# Patient Record
Sex: Female | Born: 1950
Health system: Southern US, Community
[De-identification: ages and names within clinical notes are randomized; demographics above are authoritative.]

## PROBLEM LIST (undated history)

## (undated) DIAGNOSIS — D638 Anemia in other chronic diseases classified elsewhere: Secondary | ICD-10-CM

## (undated) DIAGNOSIS — S5290XA Unspecified fracture of unspecified forearm, initial encounter for closed fracture: Secondary | ICD-10-CM

## (undated) DIAGNOSIS — K219 Gastro-esophageal reflux disease without esophagitis: Secondary | ICD-10-CM

## (undated) DIAGNOSIS — I10 Essential (primary) hypertension: Secondary | ICD-10-CM

## (undated) DIAGNOSIS — Z972 Presence of dental prosthetic device (complete) (partial): Secondary | ICD-10-CM

## (undated) DIAGNOSIS — F419 Anxiety disorder, unspecified: Secondary | ICD-10-CM

## (undated) DIAGNOSIS — K208 Other esophagitis without bleeding: Secondary | ICD-10-CM

## (undated) DIAGNOSIS — R0789 Other chest pain: Secondary | ICD-10-CM

## (undated) DIAGNOSIS — B3731 Acute candidiasis of vulva and vagina: Secondary | ICD-10-CM

## (undated) DIAGNOSIS — R4189 Other symptoms and signs involving cognitive functions and awareness: Secondary | ICD-10-CM

## (undated) DIAGNOSIS — E278 Other specified disorders of adrenal gland: Secondary | ICD-10-CM

## (undated) DIAGNOSIS — G4700 Insomnia, unspecified: Secondary | ICD-10-CM

## (undated) DIAGNOSIS — K589 Irritable bowel syndrome without diarrhea: Secondary | ICD-10-CM

## (undated) DIAGNOSIS — E785 Hyperlipidemia, unspecified: Secondary | ICD-10-CM

## (undated) DIAGNOSIS — G629 Polyneuropathy, unspecified: Secondary | ICD-10-CM

## (undated) DIAGNOSIS — I639 Cerebral infarction, unspecified: Secondary | ICD-10-CM

## (undated) DIAGNOSIS — R739 Hyperglycemia, unspecified: Secondary | ICD-10-CM

## (undated) DIAGNOSIS — M1990 Unspecified osteoarthritis, unspecified site: Secondary | ICD-10-CM

## (undated) DIAGNOSIS — G219 Secondary parkinsonism, unspecified: Secondary | ICD-10-CM

## (undated) DIAGNOSIS — K5730 Diverticulosis of large intestine without perforation or abscess without bleeding: Secondary | ICD-10-CM

## (undated) DIAGNOSIS — N183 Chronic kidney disease, stage 3 unspecified: Secondary | ICD-10-CM

## (undated) DIAGNOSIS — D649 Anemia, unspecified: Secondary | ICD-10-CM

## (undated) DIAGNOSIS — K2960 Other gastritis without bleeding: Secondary | ICD-10-CM

## (undated) DIAGNOSIS — B373 Candidiasis of vulva and vagina: Secondary | ICD-10-CM

## (undated) DIAGNOSIS — R7989 Other specified abnormal findings of blood chemistry: Secondary | ICD-10-CM

## (undated) DIAGNOSIS — F488 Other specified nonpsychotic mental disorders: Secondary | ICD-10-CM

## (undated) DIAGNOSIS — E119 Type 2 diabetes mellitus without complications: Secondary | ICD-10-CM

## (undated) DIAGNOSIS — E079 Disorder of thyroid, unspecified: Secondary | ICD-10-CM

## (undated) HISTORY — DX: Cerebral infarction, unspecified: I63.9

## (undated) HISTORY — DX: Unspecified fracture of unspecified forearm, initial encounter for closed fracture: S52.90XA

## (undated) HISTORY — DX: Anemia in other chronic diseases classified elsewhere: D63.8

## (undated) HISTORY — DX: Diverticulosis of large intestine without perforation or abscess without bleeding: K57.30

## (undated) HISTORY — DX: Hyperlipidemia, unspecified: E78.5

## (undated) HISTORY — DX: Anxiety disorder, unspecified: F41.9

## (undated) HISTORY — DX: Disorder of thyroid, unspecified: E07.9

## (undated) HISTORY — DX: Other chest pain: R07.89

## (undated) HISTORY — DX: Polyneuropathy, unspecified: G62.9

## (undated) HISTORY — DX: Other esophagitis without bleeding: K20.80

## (undated) HISTORY — DX: Irritable bowel syndrome, unspecified: K58.9

## (undated) HISTORY — DX: Anemia, unspecified: D64.9

## (undated) HISTORY — DX: Essential (primary) hypertension: I10

## (undated) HISTORY — DX: Insomnia, unspecified: G47.00

## (undated) HISTORY — DX: Other esophagitis: K20.8

## (undated) HISTORY — DX: Hyperglycemia, unspecified: R73.9

## (undated) HISTORY — PX: ESOPHAGOGASTRODUODENOSCOPY: SHX1529

## (undated) HISTORY — DX: Other gastritis without bleeding: K29.60

## (undated) HISTORY — DX: Gastro-esophageal reflux disease without esophagitis: K21.9

## (undated) HISTORY — DX: Other symptoms and signs involving cognitive functions and awareness: R41.89

## (undated) HISTORY — DX: Type 2 diabetes mellitus without complications: E11.9

## (undated) HISTORY — DX: Secondary parkinsonism, unspecified: G21.9

## (undated) HISTORY — DX: Other specified nonpsychotic mental disorders: F48.8

## (undated) HISTORY — DX: Acute candidiasis of vulva and vagina: B37.31

## (undated) HISTORY — PX: COLONOSCOPY: SHX174

## (undated) HISTORY — DX: Other specified abnormal findings of blood chemistry: R79.89

## (undated) HISTORY — PX: BREAST CYST ASPIRATION: SHX578

## (undated) HISTORY — DX: Other specified disorders of adrenal gland: E27.8

## (undated) HISTORY — DX: Candidiasis of vulva and vagina: B37.3

---

## 1975-02-07 HISTORY — PX: OTHER SURGICAL HISTORY: SHX169

## 1982-02-06 HISTORY — PX: BREAST REDUCTION SURGERY: SHX8

## 1983-02-07 HISTORY — PX: REDUCTION MAMMAPLASTY: SUR839

## 1993-02-06 HISTORY — PX: ABDOMINAL HYSTERECTOMY: SHX81

## 2002-08-07 DIAGNOSIS — F488 Other specified nonpsychotic mental disorders: Secondary | ICD-10-CM

## 2002-08-07 HISTORY — DX: Other specified nonpsychotic mental disorders: F48.8

## 2004-01-27 ENCOUNTER — Ambulatory Visit: Payer: Self-pay | Admitting: Urology

## 2004-02-16 ENCOUNTER — Encounter: Payer: Self-pay | Admitting: Family Medicine

## 2004-03-09 HISTORY — PX: CHOLECYSTECTOMY: SHX55

## 2004-03-21 ENCOUNTER — Ambulatory Visit: Payer: Self-pay | Admitting: Surgery

## 2004-03-29 ENCOUNTER — Ambulatory Visit: Payer: Self-pay | Admitting: Surgery

## 2004-03-30 ENCOUNTER — Ambulatory Visit: Payer: Self-pay | Admitting: Surgery

## 2004-06-09 ENCOUNTER — Ambulatory Visit: Payer: Self-pay | Admitting: Family Medicine

## 2004-07-19 ENCOUNTER — Ambulatory Visit: Payer: Self-pay | Admitting: Family Medicine

## 2004-07-22 ENCOUNTER — Ambulatory Visit: Payer: Self-pay | Admitting: Family Medicine

## 2004-09-02 ENCOUNTER — Ambulatory Visit: Payer: Self-pay | Admitting: Family Medicine

## 2004-09-16 ENCOUNTER — Ambulatory Visit: Payer: Self-pay | Admitting: Family Medicine

## 2004-11-07 ENCOUNTER — Ambulatory Visit: Payer: Self-pay | Admitting: Family Medicine

## 2004-11-10 ENCOUNTER — Ambulatory Visit: Payer: Self-pay | Admitting: Family Medicine

## 2004-11-15 ENCOUNTER — Ambulatory Visit: Payer: Self-pay | Admitting: Family Medicine

## 2004-11-29 ENCOUNTER — Ambulatory Visit: Payer: Self-pay | Admitting: Family Medicine

## 2005-01-06 ENCOUNTER — Ambulatory Visit: Payer: Self-pay | Admitting: Family Medicine

## 2005-01-10 ENCOUNTER — Ambulatory Visit: Payer: Self-pay | Admitting: Family Medicine

## 2005-01-24 ENCOUNTER — Ambulatory Visit: Payer: Self-pay | Admitting: Family Medicine

## 2005-02-06 ENCOUNTER — Emergency Department (HOSPITAL_COMMUNITY): Admission: EM | Admit: 2005-02-06 | Discharge: 2005-02-06 | Payer: Self-pay | Admitting: Emergency Medicine

## 2005-02-21 ENCOUNTER — Ambulatory Visit: Payer: Self-pay | Admitting: Family Medicine

## 2005-03-14 ENCOUNTER — Ambulatory Visit: Payer: Self-pay | Admitting: Family Medicine

## 2005-03-24 ENCOUNTER — Ambulatory Visit: Payer: Self-pay | Admitting: Family Medicine

## 2005-03-31 ENCOUNTER — Ambulatory Visit: Payer: Self-pay | Admitting: Family Medicine

## 2005-06-13 ENCOUNTER — Ambulatory Visit: Payer: Self-pay | Admitting: Family Medicine

## 2005-07-04 ENCOUNTER — Ambulatory Visit: Payer: Self-pay | Admitting: Family Medicine

## 2005-07-17 ENCOUNTER — Ambulatory Visit: Payer: Self-pay | Admitting: Internal Medicine

## 2005-09-18 ENCOUNTER — Ambulatory Visit: Payer: Self-pay | Admitting: Family Medicine

## 2005-11-30 ENCOUNTER — Ambulatory Visit: Payer: Self-pay | Admitting: Family Medicine

## 2005-12-13 ENCOUNTER — Encounter: Admission: RE | Admit: 2005-12-13 | Discharge: 2005-12-13 | Payer: Self-pay | Admitting: Orthopedic Surgery

## 2006-01-10 ENCOUNTER — Ambulatory Visit: Payer: Self-pay | Admitting: Family Medicine

## 2006-01-18 ENCOUNTER — Ambulatory Visit: Payer: Self-pay | Admitting: Family Medicine

## 2006-04-10 ENCOUNTER — Ambulatory Visit: Payer: Self-pay | Admitting: Family Medicine

## 2006-04-11 ENCOUNTER — Encounter: Payer: Self-pay | Admitting: Family Medicine

## 2006-06-08 ENCOUNTER — Telehealth (INDEPENDENT_AMBULATORY_CARE_PROVIDER_SITE_OTHER): Payer: Self-pay | Admitting: *Deleted

## 2006-06-12 ENCOUNTER — Encounter: Payer: Self-pay | Admitting: Family Medicine

## 2006-06-27 ENCOUNTER — Ambulatory Visit: Payer: Self-pay | Admitting: Family Medicine

## 2006-06-27 LAB — CONVERTED CEMR LAB
AST: 26 units/L (ref 0–37)
Bilirubin, Direct: 0.1 mg/dL (ref 0.0–0.3)
Chloride: 103 meq/L (ref 96–112)
Direct LDL: 151 mg/dL
Eosinophils Absolute: 0.2 10*3/uL (ref 0.0–0.6)
Eosinophils Relative: 3.4 % (ref 0.0–5.0)
GFR calc non Af Amer: 93 mL/min
Glucose, Bld: 101 mg/dL — ABNORMAL HIGH (ref 70–99)
HCT: 37.9 % (ref 36.0–46.0)
Hemoglobin: 12.9 g/dL (ref 12.0–15.0)
Lymphocytes Relative: 26 % (ref 12.0–46.0)
MCV: 83.5 fL (ref 78.0–100.0)
Monocytes Absolute: 0.4 10*3/uL (ref 0.2–0.7)
Neutrophils Relative %: 63.4 % (ref 43.0–77.0)
Potassium: 3.7 meq/L (ref 3.5–5.1)
RBC: 4.55 M/uL (ref 3.87–5.11)
Sodium: 139 meq/L (ref 135–145)
Total Bilirubin: 0.9 mg/dL (ref 0.3–1.2)
Triglycerides: 120 mg/dL (ref 0–149)
WBC: 5.2 10*3/uL (ref 4.5–10.5)

## 2006-07-05 DIAGNOSIS — E039 Hypothyroidism, unspecified: Secondary | ICD-10-CM | POA: Insufficient documentation

## 2006-07-05 DIAGNOSIS — K219 Gastro-esophageal reflux disease without esophagitis: Secondary | ICD-10-CM

## 2006-07-05 DIAGNOSIS — K5730 Diverticulosis of large intestine without perforation or abscess without bleeding: Secondary | ICD-10-CM | POA: Insufficient documentation

## 2006-07-05 DIAGNOSIS — E782 Mixed hyperlipidemia: Secondary | ICD-10-CM | POA: Insufficient documentation

## 2006-07-05 DIAGNOSIS — I10 Essential (primary) hypertension: Secondary | ICD-10-CM | POA: Insufficient documentation

## 2006-07-05 DIAGNOSIS — K589 Irritable bowel syndrome without diarrhea: Secondary | ICD-10-CM

## 2006-07-05 DIAGNOSIS — G4700 Insomnia, unspecified: Secondary | ICD-10-CM

## 2006-07-06 ENCOUNTER — Ambulatory Visit: Payer: Self-pay | Admitting: Family Medicine

## 2006-07-06 DIAGNOSIS — R7989 Other specified abnormal findings of blood chemistry: Secondary | ICD-10-CM | POA: Insufficient documentation

## 2006-07-07 ENCOUNTER — Encounter: Payer: Self-pay | Admitting: Family Medicine

## 2006-07-07 DIAGNOSIS — E278 Other specified disorders of adrenal gland: Secondary | ICD-10-CM | POA: Insufficient documentation

## 2006-07-25 ENCOUNTER — Ambulatory Visit: Payer: Self-pay | Admitting: Urology

## 2006-07-26 ENCOUNTER — Telehealth: Payer: Self-pay | Admitting: Family Medicine

## 2006-07-27 ENCOUNTER — Telehealth: Payer: Self-pay | Admitting: Family Medicine

## 2006-08-15 ENCOUNTER — Encounter: Payer: Self-pay | Admitting: Family Medicine

## 2006-08-15 ENCOUNTER — Ambulatory Visit: Payer: Self-pay | Admitting: Unknown Physician Specialty

## 2006-08-15 LAB — HM COLONOSCOPY

## 2006-08-16 ENCOUNTER — Encounter: Payer: Self-pay | Admitting: Family Medicine

## 2006-09-28 ENCOUNTER — Encounter: Payer: Self-pay | Admitting: Family Medicine

## 2006-09-28 ENCOUNTER — Telehealth: Payer: Self-pay | Admitting: Family Medicine

## 2006-09-28 ENCOUNTER — Ambulatory Visit: Payer: Self-pay | Admitting: Family Medicine

## 2006-09-28 DIAGNOSIS — M79609 Pain in unspecified limb: Secondary | ICD-10-CM

## 2006-11-09 ENCOUNTER — Ambulatory Visit: Payer: Self-pay | Admitting: Family Medicine

## 2006-11-15 ENCOUNTER — Ambulatory Visit: Payer: Self-pay | Admitting: Family Medicine

## 2006-11-15 DIAGNOSIS — E669 Obesity, unspecified: Secondary | ICD-10-CM

## 2006-11-29 ENCOUNTER — Encounter: Payer: Self-pay | Admitting: Family Medicine

## 2007-01-08 ENCOUNTER — Ambulatory Visit: Payer: Self-pay | Admitting: Family Medicine

## 2007-01-15 ENCOUNTER — Encounter: Payer: Self-pay | Admitting: Family Medicine

## 2007-01-21 ENCOUNTER — Ambulatory Visit: Payer: Self-pay | Admitting: Family Medicine

## 2007-01-25 ENCOUNTER — Encounter (INDEPENDENT_AMBULATORY_CARE_PROVIDER_SITE_OTHER): Payer: Self-pay | Admitting: *Deleted

## 2007-02-14 ENCOUNTER — Ambulatory Visit: Payer: Self-pay | Admitting: Family Medicine

## 2007-02-14 DIAGNOSIS — F411 Generalized anxiety disorder: Secondary | ICD-10-CM | POA: Insufficient documentation

## 2007-03-18 ENCOUNTER — Ambulatory Visit: Payer: Self-pay | Admitting: Family Medicine

## 2007-06-27 ENCOUNTER — Encounter: Payer: Self-pay | Admitting: Family Medicine

## 2007-07-08 DIAGNOSIS — E278 Other specified disorders of adrenal gland: Secondary | ICD-10-CM

## 2007-07-08 HISTORY — PX: CT ABD W & PELVIS WO CM: HXRAD294

## 2007-07-08 HISTORY — DX: Other specified disorders of adrenal gland: E27.8

## 2007-07-15 ENCOUNTER — Ambulatory Visit: Payer: Self-pay | Admitting: Family Medicine

## 2007-07-25 ENCOUNTER — Ambulatory Visit: Payer: Self-pay | Admitting: Urology

## 2007-07-25 ENCOUNTER — Encounter: Payer: Self-pay | Admitting: Family Medicine

## 2007-07-29 ENCOUNTER — Encounter (INDEPENDENT_AMBULATORY_CARE_PROVIDER_SITE_OTHER): Payer: Self-pay | Admitting: *Deleted

## 2007-08-02 LAB — CONVERTED CEMR LAB
ALT: 26 units/L (ref 0–35)
Alkaline Phosphatase: 90 units/L (ref 39–117)
Bilirubin, Direct: 0.1 mg/dL (ref 0.0–0.3)
CO2: 29 meq/L (ref 19–32)
Chloride: 101 meq/L (ref 96–112)
Glucose, Bld: 98 mg/dL (ref 70–99)
Lymphocytes Relative: 32.6 % (ref 12.0–46.0)
Monocytes Relative: 6.2 % (ref 3.0–12.0)
Platelets: 193 10*3/uL (ref 150–400)
Potassium: 3.9 meq/L (ref 3.5–5.1)
RDW: 13.5 % (ref 11.5–14.6)
Sodium: 140 meq/L (ref 135–145)
Total Protein: 7.6 g/dL (ref 6.0–8.3)

## 2007-08-13 DIAGNOSIS — R339 Retention of urine, unspecified: Secondary | ICD-10-CM | POA: Insufficient documentation

## 2007-08-15 ENCOUNTER — Ambulatory Visit: Payer: Self-pay | Admitting: Family Medicine

## 2007-08-30 ENCOUNTER — Telehealth: Payer: Self-pay | Admitting: Family Medicine

## 2007-09-04 ENCOUNTER — Telehealth: Payer: Self-pay | Admitting: Family Medicine

## 2007-09-06 ENCOUNTER — Telehealth: Payer: Self-pay | Admitting: Family Medicine

## 2007-09-06 ENCOUNTER — Encounter (INDEPENDENT_AMBULATORY_CARE_PROVIDER_SITE_OTHER): Payer: Self-pay | Admitting: *Deleted

## 2007-09-09 ENCOUNTER — Encounter: Payer: Self-pay | Admitting: Family Medicine

## 2007-09-23 ENCOUNTER — Other Ambulatory Visit: Payer: Self-pay

## 2007-09-23 ENCOUNTER — Ambulatory Visit: Payer: Self-pay

## 2007-09-25 ENCOUNTER — Ambulatory Visit: Payer: Self-pay | Admitting: Family Medicine

## 2007-11-28 ENCOUNTER — Encounter: Payer: Self-pay | Admitting: Family Medicine

## 2008-01-08 ENCOUNTER — Encounter: Payer: Self-pay | Admitting: Family Medicine

## 2008-01-08 HISTORY — PX: CYSTOSCOPY: SUR368

## 2008-02-03 ENCOUNTER — Ambulatory Visit: Payer: Self-pay | Admitting: Family Medicine

## 2008-02-03 DIAGNOSIS — K2960 Other gastritis without bleeding: Secondary | ICD-10-CM

## 2008-02-03 DIAGNOSIS — K208 Other esophagitis: Secondary | ICD-10-CM

## 2008-02-12 ENCOUNTER — Ambulatory Visit: Payer: Self-pay | Admitting: Family Medicine

## 2008-02-12 LAB — CONVERTED CEMR LAB
Albumin: 3.6 g/dL (ref 3.5–5.2)
Alkaline Phosphatase: 123 units/L — ABNORMAL HIGH (ref 39–117)
Bilirubin, Direct: 0.1 mg/dL (ref 0.0–0.3)
Cholesterol: 231 mg/dL (ref 0–200)
TSH: 0.04 microintl units/mL — ABNORMAL LOW (ref 0.35–5.50)
Total CHOL/HDL Ratio: 6.4
VLDL: 24 mg/dL (ref 0–40)

## 2008-02-17 ENCOUNTER — Ambulatory Visit: Payer: Self-pay | Admitting: Family Medicine

## 2008-02-17 DIAGNOSIS — B373 Candidiasis of vulva and vagina: Secondary | ICD-10-CM

## 2008-02-17 DIAGNOSIS — B3731 Acute candidiasis of vulva and vagina: Secondary | ICD-10-CM | POA: Insufficient documentation

## 2008-02-26 ENCOUNTER — Encounter: Payer: Self-pay | Admitting: Family Medicine

## 2008-02-26 ENCOUNTER — Ambulatory Visit: Payer: Self-pay | Admitting: Family Medicine

## 2008-02-28 ENCOUNTER — Encounter (INDEPENDENT_AMBULATORY_CARE_PROVIDER_SITE_OTHER): Payer: Self-pay | Admitting: *Deleted

## 2008-03-09 ENCOUNTER — Encounter: Payer: Self-pay | Admitting: Family Medicine

## 2008-03-19 ENCOUNTER — Telehealth: Payer: Self-pay | Admitting: Family Medicine

## 2008-03-21 ENCOUNTER — Encounter: Payer: Self-pay | Admitting: Family Medicine

## 2008-04-03 ENCOUNTER — Emergency Department (HOSPITAL_COMMUNITY): Admission: EM | Admit: 2008-04-03 | Discharge: 2008-04-03 | Payer: Self-pay | Admitting: Family Medicine

## 2008-05-19 ENCOUNTER — Ambulatory Visit: Payer: Self-pay | Admitting: Family Medicine

## 2008-05-19 DIAGNOSIS — R0789 Other chest pain: Secondary | ICD-10-CM | POA: Insufficient documentation

## 2008-05-21 ENCOUNTER — Encounter: Payer: Self-pay | Admitting: Cardiology

## 2008-05-21 ENCOUNTER — Ambulatory Visit: Payer: Self-pay | Admitting: Family Medicine

## 2008-05-25 ENCOUNTER — Ambulatory Visit: Payer: Self-pay | Admitting: Family Medicine

## 2008-05-25 ENCOUNTER — Ambulatory Visit: Payer: Self-pay | Admitting: Cardiology

## 2008-06-02 ENCOUNTER — Telehealth (INDEPENDENT_AMBULATORY_CARE_PROVIDER_SITE_OTHER): Payer: Self-pay | Admitting: *Deleted

## 2008-06-03 ENCOUNTER — Encounter: Payer: Self-pay | Admitting: Cardiology

## 2008-06-03 ENCOUNTER — Ambulatory Visit: Payer: Self-pay

## 2008-06-08 ENCOUNTER — Ambulatory Visit: Payer: Self-pay | Admitting: Cardiology

## 2008-06-11 ENCOUNTER — Telehealth (INDEPENDENT_AMBULATORY_CARE_PROVIDER_SITE_OTHER): Payer: Self-pay | Admitting: *Deleted

## 2008-08-18 ENCOUNTER — Ambulatory Visit: Payer: Self-pay | Admitting: Family Medicine

## 2008-08-18 ENCOUNTER — Encounter: Payer: Self-pay | Admitting: Family Medicine

## 2008-08-18 LAB — CONVERTED CEMR LAB
BUN: 17 mg/dL (ref 6–23)
Basophils Absolute: 0 10*3/uL (ref 0.0–0.1)
Bilirubin, Direct: 0.1 mg/dL (ref 0.0–0.3)
Chloride: 102 meq/L (ref 96–112)
Cholesterol: 269 mg/dL — ABNORMAL HIGH (ref 0–200)
Creatinine, Ser: 0.9 mg/dL (ref 0.4–1.2)
Creatinine,U: 183 mg/dL
Direct LDL: 186.7 mg/dL
Eosinophils Absolute: 0.2 10*3/uL (ref 0.0–0.7)
Eosinophils Relative: 2.7 % (ref 0.0–5.0)
Free T4: 1.3 ng/dL (ref 0.6–1.6)
Glucose, Bld: 89 mg/dL (ref 70–99)
MCHC: 34.3 g/dL (ref 30.0–36.0)
MCV: 85.1 fL (ref 78.0–100.0)
Microalb, Ur: 0.9 mg/dL (ref 0.0–1.9)
Monocytes Absolute: 0.5 10*3/uL (ref 0.1–1.0)
Neutrophils Relative %: 59.9 % (ref 43.0–77.0)
Platelets: 224 10*3/uL (ref 150.0–400.0)
RDW: 13.6 % (ref 11.5–14.6)
TSH: 0.21 microintl units/mL — ABNORMAL LOW (ref 0.35–5.50)
Total Bilirubin: 1 mg/dL (ref 0.3–1.2)
VLDL: 35.2 mg/dL (ref 0.0–40.0)
WBC: 5.9 10*3/uL (ref 4.5–10.5)

## 2008-08-24 ENCOUNTER — Telehealth: Payer: Self-pay | Admitting: Family Medicine

## 2008-08-24 ENCOUNTER — Encounter: Payer: Self-pay | Admitting: Family Medicine

## 2008-08-24 ENCOUNTER — Ambulatory Visit: Payer: Self-pay | Admitting: Family Medicine

## 2008-08-24 ENCOUNTER — Other Ambulatory Visit: Admission: RE | Admit: 2008-08-24 | Discharge: 2008-08-24 | Payer: Self-pay | Admitting: Family Medicine

## 2008-08-24 DIAGNOSIS — G609 Hereditary and idiopathic neuropathy, unspecified: Secondary | ICD-10-CM

## 2008-08-26 ENCOUNTER — Encounter: Payer: Self-pay | Admitting: Family Medicine

## 2008-08-27 ENCOUNTER — Encounter (INDEPENDENT_AMBULATORY_CARE_PROVIDER_SITE_OTHER): Payer: Self-pay | Admitting: *Deleted

## 2008-09-01 ENCOUNTER — Telehealth: Payer: Self-pay | Admitting: Cardiology

## 2008-09-07 ENCOUNTER — Ambulatory Visit: Payer: Self-pay | Admitting: Family Medicine

## 2008-09-07 DIAGNOSIS — E559 Vitamin D deficiency, unspecified: Secondary | ICD-10-CM | POA: Insufficient documentation

## 2008-09-07 DIAGNOSIS — L719 Rosacea, unspecified: Secondary | ICD-10-CM | POA: Insufficient documentation

## 2008-09-10 ENCOUNTER — Encounter: Payer: Self-pay | Admitting: Cardiology

## 2008-09-11 ENCOUNTER — Encounter: Payer: Self-pay | Admitting: Cardiology

## 2008-11-09 ENCOUNTER — Telehealth: Payer: Self-pay | Admitting: Family Medicine

## 2008-12-07 ENCOUNTER — Ambulatory Visit: Payer: Self-pay | Admitting: Family Medicine

## 2008-12-10 ENCOUNTER — Ambulatory Visit: Payer: Self-pay | Admitting: Family Medicine

## 2008-12-10 DIAGNOSIS — R1012 Left upper quadrant pain: Secondary | ICD-10-CM

## 2008-12-30 ENCOUNTER — Emergency Department: Payer: Self-pay | Admitting: Emergency Medicine

## 2008-12-30 ENCOUNTER — Encounter: Payer: Self-pay | Admitting: Family Medicine

## 2009-01-06 ENCOUNTER — Ambulatory Visit: Payer: Self-pay | Admitting: Family Medicine

## 2009-01-06 DIAGNOSIS — H8110 Benign paroxysmal vertigo, unspecified ear: Secondary | ICD-10-CM

## 2009-01-07 ENCOUNTER — Encounter: Payer: Self-pay | Admitting: Family Medicine

## 2009-01-11 ENCOUNTER — Encounter: Payer: Self-pay | Admitting: Family Medicine

## 2009-01-22 ENCOUNTER — Encounter: Payer: Self-pay | Admitting: Family Medicine

## 2009-02-06 ENCOUNTER — Encounter: Payer: Self-pay | Admitting: Family Medicine

## 2009-03-04 ENCOUNTER — Telehealth: Payer: Self-pay | Admitting: Family Medicine

## 2009-04-29 ENCOUNTER — Encounter: Payer: Self-pay | Admitting: Family Medicine

## 2009-05-06 ENCOUNTER — Ambulatory Visit: Payer: Self-pay | Admitting: Unknown Physician Specialty

## 2009-05-11 ENCOUNTER — Ambulatory Visit: Payer: Self-pay | Admitting: Family Medicine

## 2009-05-20 ENCOUNTER — Ambulatory Visit: Payer: Self-pay | Admitting: Unknown Physician Specialty

## 2009-05-20 ENCOUNTER — Encounter: Payer: Self-pay | Admitting: Family Medicine

## 2009-05-20 DIAGNOSIS — K5730 Diverticulosis of large intestine without perforation or abscess without bleeding: Secondary | ICD-10-CM

## 2009-05-20 HISTORY — DX: Diverticulosis of large intestine without perforation or abscess without bleeding: K57.30

## 2009-06-15 ENCOUNTER — Ambulatory Visit: Payer: Self-pay | Admitting: Family Medicine

## 2009-06-15 DIAGNOSIS — N390 Urinary tract infection, site not specified: Secondary | ICD-10-CM | POA: Insufficient documentation

## 2009-06-29 ENCOUNTER — Ambulatory Visit: Payer: Self-pay | Admitting: Family Medicine

## 2009-06-29 ENCOUNTER — Encounter: Payer: Self-pay | Admitting: Family Medicine

## 2009-06-30 ENCOUNTER — Encounter (INDEPENDENT_AMBULATORY_CARE_PROVIDER_SITE_OTHER): Payer: Self-pay | Admitting: *Deleted

## 2009-07-22 ENCOUNTER — Telehealth: Payer: Self-pay | Admitting: Family Medicine

## 2009-09-08 ENCOUNTER — Telehealth: Payer: Self-pay | Admitting: Family Medicine

## 2009-09-09 ENCOUNTER — Encounter (INDEPENDENT_AMBULATORY_CARE_PROVIDER_SITE_OTHER): Payer: Self-pay | Admitting: *Deleted

## 2009-11-02 ENCOUNTER — Ambulatory Visit: Payer: Self-pay | Admitting: Family Medicine

## 2009-11-02 ENCOUNTER — Encounter: Payer: Self-pay | Admitting: Family Medicine

## 2009-11-03 LAB — CONVERTED CEMR LAB
ALT: 13 units/L (ref 0–35)
AST: 19 units/L (ref 0–37)
Basophils Relative: 0.4 % (ref 0.0–3.0)
Bilirubin, Direct: 0.1 mg/dL (ref 0.0–0.3)
Chloride: 99 meq/L (ref 96–112)
Creatinine, Ser: 0.9 mg/dL (ref 0.4–1.2)
Eosinophils Relative: 2.2 % (ref 0.0–5.0)
GFR calc non Af Amer: 66.71 mL/min (ref >60)
HCT: 37.4 % (ref 36.0–46.0)
HDL: 49.9 mg/dL (ref >39.00)
MCV: 85.3 fL (ref 78.0–100.0)
Monocytes Absolute: 0.4 10*3/uL (ref 0.1–1.0)
Monocytes Relative: 5.8 % (ref 3.0–12.0)
Neutrophils Relative %: 61.5 % (ref 43.0–77.0)
Platelets: 220 10*3/uL (ref 150.0–400.0)
Potassium: 3.9 meq/L (ref 3.5–5.1)
Pro B Natriuretic peptide (BNP): 62.7 pg/mL (ref 0.0–100.0)
RBC: 4.38 M/uL (ref 3.87–5.11)
Total Bilirubin: 0.6 mg/dL (ref 0.3–1.2)
Total CHOL/HDL Ratio: 6
Total Protein: 7.8 g/dL (ref 6.0–8.3)
VLDL: 33.6 mg/dL (ref 0.0–40.0)
WBC: 6.5 10*3/uL (ref 4.5–10.5)

## 2009-11-25 ENCOUNTER — Ambulatory Visit: Payer: Self-pay | Admitting: Internal Medicine

## 2009-11-25 DIAGNOSIS — J0100 Acute maxillary sinusitis, unspecified: Secondary | ICD-10-CM | POA: Insufficient documentation

## 2010-03-08 NOTE — Letter (Signed)
Summary: Elizabeth Rivera letter  [place] at [facility]  [address] [place], [place] [address]   Phone: [phone]  Fax: [phone]       09/09/2009 MRN: [id number]  [place], [place]  [address]  Dear Ms. [place],  [place], and [place] announce the retirement of Modesto Charon, M.D., from full-time practice at the [facility] office effective August 05, 2009 and his plans of returning part-time.  It is important to Dr. Council Mechanic and to our practice that you understand that [place] has seven physicians in our office for your health care needs.  We will continue to offer the same exceptional care that you have today.    Dr. Council Mechanic has spoken to many of you about his plans for retirement and returning part-time in the fall.   We will continue to work with you through the transition to schedule appointments for you in the office and meet the high standards that [place] is committed to.   Again, it is with great pleasure that we share the news that Dr. Council Mechanic will return to [facility] at [facility] in October of 2011 with a reduced schedule.    If you have any questions, or would like to request an appointment with one of our physicians, please call us at [phone] and press the option for Scheduling an appointment.  We take pleasure in providing you with excellent patient care and look forward to seeing you at your next office visit.  [place] [facility] are:  Viviana Simpler, M.D. Teresa Pelton, M.D. Loura Pardon, M.D. Eliezer Lofts, M.D. Owens Loffler, M.D. Arnette Norris, M.D. We proudly welcomed Renford Dills, M.D. and Ria Bush, M.D. to the practice in July/August 2011.  Sincerely,  [facility]

## 2010-03-08 NOTE — Procedures (Signed)
Summary: Colonoscopy by Dr.Devera Englander [facility]  Colonoscopy by Dr.Brylee Berk [facility]   Imported By: Virgia Land 05/25/2009 15:22:46  _____________________________________________________________________  External Attachment:    Type:   Image     Comment:   External Document  Appended Document: Colonoscopy by Dr.Skyra Crichlow [facility]    Clinical Lists Changes  Observations: Added new observation of PAST SURG HX: [facility] 7 days 1983 NSVD x 1 1977 Breast Reduction 1984 Part Hyst BSO DUB ovarian cyst B9 tumors  1995 [facility] Nervous Breakdown 08/2002 Choleycystectomy 03/2004 CT Head- normal , labs normal (02/06/2005) EGD- reflux, HH (09/01/2002) EGD Reflux Esoph  HH  Gastritis Nonbleeding Erosive Gastropathy  Duodenitis   08/15/2006 Colonoscopy  Int Hemms 08/15/2006 CT Scan Abd Stable Adrenal Adenoma 1.4cm left Adrenal no change (Dr Jacqlyn Larsen) 07/2007 Cystoscopy Former site of inflamm healed (Dr Jacqlyn Larsen) 01/08/2008 Colonoscopy Divertics  Int Hemms (Dr Vira Agar) 05/20/2009  (05/25/2009 16:31)       Past Surgical History:    [facility] hosp 7 days 1983    NSVD x 1 1977    Breast Reduction 1984    Part Hyst BSO DUB ovarian cyst B9 tumors  1995    [facility] Nervous Breakdown 08/2002    Choleycystectomy 03/2004    CT Head- normal , labs normal (02/06/2005)    EGD- reflux, HH (09/01/2002)    EGD Reflux Esoph  HH  Gastritis Nonbleeding Erosive Gastropathy  Duodenitis   08/15/2006    Colonoscopy  Int Hemms 08/15/2006    CT Scan Abd Stable Adrenal Adenoma 1.4cm left Adrenal no change (Dr Jacqlyn Larsen) 07/2007    Cystoscopy Former site of inflamm healed (Dr Jacqlyn Larsen) 01/08/2008    Colonoscopy Divertics  Int Hemms (Dr Vira Agar) 05/20/2009

## 2010-03-08 NOTE — Progress Notes (Signed)
Summary: breast abscess  Phone Note Call from Patient   [name]: Patient Summary of Call: Pt states she has had a breast abscess for about 3 days and it is getting worse.  There are no appts available today so I [facility] [place].  She was agreeable to that. Initial call taken by: Marty Heck CMA,  July 22, 2009 12:39 PM  Follow-up for Phone Call        Noted. Follow-up by: Raenette Rover MD,  July 22, 2009 1:05 PM

## 2010-03-08 NOTE — Assessment & Plan Note (Signed)
Summary: ST/CLE   Vital Signs:  Patient profile:   60 year old female Weight:      188.25 pounds Temp:     98.0 degrees F oral Pulse rate:   74 / minute Pulse rhythm:   regular BP sitting:   156 / 100  (left arm) Cuff size:   large  Vitals Entered By: Maudie Mercury Dance CMA Deborra Medina) (November 25, 2009 11:31 AM) CC: Sore throat   History of Present Illness: CC: nasal pressure, ST  2-3wk h/o sinus pressure, dry eyes, and facial flushing (recently worse since on steroids).  Has tried tylenol to relieve pressure.  using dristan nasal spray andvicks inhaler which have helped some.  + chills.  + cough and ST.  + PNDrip.  + congestion and RN.  Thick yellow mucous coming out.  Worse with bending head forward  No fevers.  No tooth pain or ear pain.  BP elevated but on steroids per podiatry for heel spur.  no smokers at home.  Current Medications (verified): 1)  Effexor Xr 75 Mg  Xr24h-Cap (Venlafaxine Hcl) .... Take One By Mouth Three Times A Day 2)  Protonix 40 Mg Solr (Pantoprazole Sodium) .... One Tab By Mouth 45 Mins Before Brfst and Supper. 3)  Toprol Xl 100 Mg Tb24 (Metoprolol Succinate) .... Take One By Mouth Twice A Day 4)  Levothyroxine Sodium 137 Mcg Tabs (Levothyroxine Sodium) .... Take One By Mouth Daily 5)  Sg Enteric Coated Aspirin  Tbec (Aspirin Tbec) .... Take One 81 Mg By Mouth Daily 6)  Vitamin C 500 Mg Tabs (Ascorbic Acid) .... Take One By Mouth Two Times A Day 7)  Vitamin E  Caps (Vitamin E Caps) .... Take One 500 International Units By Mouth Two Times A Day 8)  Imipramine Hcl 25 Mg  Tabs (Imipramine Hcl) .Marland Kitchen.. 1 By Mouth Every Evening 9)  Micardis Hct 80-25 Mg Tabs (Telmisartan-Hctz) .Marland Kitchen.. 1 in Am By Mouth 10)  Crestor 20 Mg Tabs (Rosuvastatin Calcium) .... Take One Tablet By Mouth Daily. 11)  Amlodipine Besylate 5 Mg Tabs (Amlodipine Besylate) .... Take One Tablet By Mouth Daily 12)  Vitamin D (Ergocalciferol) 50000 Unit Caps (Ergocalciferol) .... One Tab By Mouth Once  Weekly 13)  Hydrochlorothiazide 25 Mg Tabs (Hydrochlorothiazide) .Marland Kitchen.. 1 By Mouth Once Daily 14)  Methylprednisolone 4 Mg Tabs (Methylprednisolone) .... Dose Pack  As Directed  Allergies: 1)  Sulfa  Past History:  Past Medical History: Last updated: 07/15/2008 CHEST DISCOMFORT (ICD-786.59) OBESITY (ICD-278.00) HYPERTENSION (ICD-401.9) HYPERLIPIDEMIA, MIXED (ICD-272.2) NEUROPATHY (ICD-355.9) VAGINITIS, CANDIDAL (ICD-112.1) EROSIVE ESOPHAGITIS (ICD-530.19) EROSIVE GASTRITIS (ICD-535.40) URI (ICD-465.9) LEG PAIN, RIGHT (ICD-729.5) ADRENAL MASS VIA CT (ICD-255.8) HYPERGLYCEMIA (ICD-790.6) HYPOTHYROIDISM (ICD-244.9) GERD (ICD-530.81) DIVERTICULOSIS, COLON (ICD-562.10) ANXIETY (ICD-300.00) IBS (ICD-564.1) Hx of INSOMNIA (ICD-780.52) Hx of IBS (ICD-564.1)  Social History: Last updated: 07/15/2008 Marital Status: Married Children: 1 Occupation: Advertising copywriter, [employer] manager Tobacco Use - No.  Alcohol Use - no Regular Exercise - no Drug Use - no  Physical Exam  General:  Well-developed,well-nourished,in no acute distress; alert,appropriate and cooperative throughout examination.  congested Head:  Normocephalic and atraumatic without obvious abnormalities. No apparent alopecia or balding.  + maxillary sinus tenderness R>L Eyes:  PERRLA, EOMI, no injection Ears:  External ear exam shows no significant lesions or deformities.  Otoscopic examination reveals clear canals, tympanic membranes are intact bilaterally without bulging, retraction, inflammation or discharge. Hearing is grossly normal bilaterally. Nose:  congested, + yellow mucous d/c Mouth:  slight pharyngeal erythema, MMM Neck:  No  deformities, masses, or tenderness noted. Lungs:  Normal respiratory effort, chest expands symmetrically. Lungs are clear to auscultation, no crackles or wheezes. Heart:  Normal rate and regular rhythm. S1 and S2 normal without gallop, murmur, click, rub or other extra sounds. Pulses:   2+ rad pulses Extremities:  good cap refill, normal skin turgor   Impression & Recommendations:  Problem # 1:  SINUSITIS, MAXILLARY, ACUTE (ICD-461.0) See pt instructions for treatment plan. Instructed on treatment. Call if symptoms persist or worsen.   Her updated medication list for this problem includes:    Amoxicillin 875 Mg Tabs (Amoxicillin) ..... One by mouth two times a day x 10 days  Complete Medication List: 1)  Effexor Xr 75 Mg Xr24h-cap (Venlafaxine hcl) .... Take one by mouth three times a day 2)  Protonix 40 Mg Solr (Pantoprazole sodium) .... One tab by mouth 45 mins before brfst and supper. 3)  Toprol Xl 100 Mg Tb24 (Metoprolol succinate) .... Take one by mouth twice a day 4)  Levothyroxine Sodium 137 Mcg Tabs (Levothyroxine sodium) .... Take one by mouth daily 5)  Sg Enteric Coated Aspirin Tbec (Aspirin tbec) .... Take one 81 mg by mouth daily 6)  Vitamin C 500 Mg Tabs (Ascorbic acid) .... Take one by mouth two times a day 7)  Vitamin E Caps (Vitamin e caps) .... Take one 500 international units by mouth two times a day 8)  Imipramine Hcl 25 Mg Tabs (Imipramine hcl) .Marland Kitchen.. 1 by mouth every evening 9)  Micardis Hct 80-25 Mg Tabs (Telmisartan-hctz) .Marland Kitchen.. 1 in am by mouth 10)  Crestor 20 Mg Tabs (Rosuvastatin calcium) .... Take one tablet by mouth daily. 11)  Amlodipine Besylate 5 Mg Tabs (Amlodipine besylate) .... Take one tablet by mouth daily 12)  Vitamin D (ergocalciferol) 50000 Unit Caps (Ergocalciferol) .... One tab by mouth once weekly 13)  Hydrochlorothiazide 25 Mg Tabs (Hydrochlorothiazide) .Marland Kitchen.. 1 by mouth once daily 14)  Methylprednisolone 4 Mg Tabs (Methylprednisolone) .... Dose pack  as directed 15)  Amoxicillin 875 Mg Tabs (Amoxicillin) .... One by mouth two times a day x 10 days  Patient Instructions: 1)  You have a sinus infection. 2)  Take medicines as prescribed:  Amoxicillin 875mg  twice daily for next 10 days. 3)  Take guaifenesin 400mg  IR 1 1/2 pills in  am and at noon with plenty of fluid to help mobilize mucous. 4)  Use nasal saline spray or neti pot to help drainage of sinuses. 5)  If you start having fevers >101.5, trouble swallowing or breathing, or are worsening instead of improving as expected, you may need to be seen again. 6)  Good to see you today, call clinic with questions.  Prescriptions: AMOXICILLIN 875 MG TABS (AMOXICILLIN) one by mouth two times a day x 10 days  #20 x 0   Entered and Authorized by:   Ria Bush  MD   Signed by:   Ria Bush  MD on 11/25/2009   Method used:   Print then Give to Patient   RxID:   [id number]    Orders Added: 1)  Est. Patient Level III [id number]    Current Allergies (reviewed today): SULFA

## 2010-03-08 NOTE — Progress Notes (Signed)
Summary: prior Elizabeth Rivera is needed for pantoprazole/ lansoprazole  Phone Note From Pharmacy   [name]: [facility] [address]. #[id number]*/ [facility] Summary of Call: Prior Elizabeth Rivera is needed for pantoprazole.  Insurance is requiring trials of otc PPI or omeprazole, then lansoprazole, then brand PPI at one per day first.  Form is on your desk.                Marty Heck CMA  September 08, 2009 12:22 PM   Follow-up for Phone Call        please send in rx for lansoprazole 30mg  by mouth two times a day.  #60, 5rf.  If she fails this, we need to document it and have her switched back to protonix. Follow-up by: Elsie Stain MD,  September 08, 2009 1:55 PM  Additional Follow-up for Phone Call Additional follow up Details #1::        Advised pt of the need to change medicine.  [facility] [address].  May have to do prior auth for two times a day dosing on lansoprazole.              Marty Heck CMA  September 09, 2009 4:34 PM  Now prior Elizabeth Rivera is needed for lansoprazole, form is on your desk.         Marty Heck CMA  September 10, 2009 1:22 PM     Additional Follow-up for Phone Call Additional follow up Details #2::    done.  thanks.  Follow-up by: Elsie Stain MD,  September 10, 2009 1:52 PM  Additional Follow-up for Phone Call Additional follow up Details #3:: Details for Additional Follow-up Action Taken: Form faxed.               Marty Heck CMA  September 10, 2009 3:26 PM

## 2010-03-08 NOTE — Assessment & Plan Note (Signed)
Summary: feet swelling/alc   Vital Signs:  Patient profile:   60 year old female Height:      62.50 inches Weight:      189.25 pounds BMI:     34.19 Temp:     97.9 degrees F oral Pulse rate:   68 / minute Pulse rhythm:   regular BP sitting:   126 / 88  (left arm) Cuff size:   large  Vitals Entered By: Christena Deem CMA Deborra Medina) (November 02, 2009 10:45 AM) CC: Feet swelling.   History of Present Illness: Bilateral lower extremity edema noted later in the day.  Noted for about 1 month, increasing  during the last month. Nocturia noted by patient.  CP going on for >1 week, intermittent.  No CP now.  Occ heart racing.  SOB with CP when it happens.  Occurs at rest and with activity.  She can exert herself w/o symptoms.  No calf pain with walking.  H/o anxiety.  normal myoview last year with cards.   About 1 month ago- changed from protonix to another PPI, name unrecalled.  Inc in GERD symptoms in the meantime.  No vomiting.   Allergies: 1)  Sulfa  Past History:  Past Medical History: Last updated: 07/15/2008 CHEST DISCOMFORT (ICD-786.59) OBESITY (ICD-278.00) HYPERTENSION (ICD-401.9) HYPERLIPIDEMIA, MIXED (ICD-272.2) NEUROPATHY (ICD-355.9) VAGINITIS, CANDIDAL (ICD-112.1) EROSIVE ESOPHAGITIS (ICD-530.19) EROSIVE GASTRITIS (ICD-535.40) URI (ICD-465.9) LEG PAIN, RIGHT (ICD-729.5) ADRENAL MASS VIA CT (ICD-255.8) HYPERGLYCEMIA (ICD-790.6) HYPOTHYROIDISM (ICD-244.9) GERD (ICD-530.81) DIVERTICULOSIS, COLON (ICD-562.10) ANXIETY (ICD-300.00) IBS (ICD-564.1) Hx of INSOMNIA (ICD-780.52) Hx of IBS (ICD-564.1)  Family History: Last updated: 11/02/2009 Father: A Vision problems Mother: A  HTN, DM, mini strokes Chol Sister A 93  Vision Problems HTN Chol  Social History: Last updated: 07/15/2008 Marital Status: Married Children: 1 Occupation: Advertising copywriter, [employer] manager Tobacco Use - No.  Alcohol Use - no Regular Exercise - no Drug Use - no  Family  History: Reviewed history from 08/24/2008 and no changes required. Father: A Vision problems Mother: A  HTN, DM, mini strokes Chol Sister A 50  Vision Problems HTN Chol  Social History: Reviewed history from 07/15/2008 and no changes required. Marital Status: Married Children: 1 Occupation: Advertising copywriter, [employer] manager Tobacco Use - No.  Alcohol Use - no Regular Exercise - no Drug Use - no  Review of Systems       See HPI.  Otherwise negative.    Physical Exam  General:  GEN: nad, alert and oriented HEENT: mucous membranes moist NECK: supple w/o LA CV: rrr.  no murmur PULM: ctab, no inc wob ABD: soft, +bs EXT: no edema SKIN: no acute rash    Impression & Recommendations:  Problem # 1:  CHEST PAIN, ATYPICAL (ICD-786.59) I think this is unlikely to be cardiac in origin.  She can exert w/o symptoms.  H/o normal myoview last year.  Sx coincide with change in PPI.  Likely GI/GERD related.  patient to call back with name of med in case we need to do PA for protonix.  New rx for protonix done today.  will contact with labs.  No sig change on EKG today, compared to prev.  Orders: TLB-Lipid Panel (80061-LIPID) TLB-BMP (Basic Metabolic Panel-BMET) (99991111) TLB-CBC Platelet - w/Differential (85025-CBCD) TLB-Hepatic/Liver Function Pnl (80076-HEPATIC) TLB-TSH (Thyroid Stimulating Hormone) (84443-TSH) EKG w/ Interpretation (93000) TLB-BNP (B-Natriuretic Peptide) (83880-BNPR)  Complete Medication List: 1)  Effexor Xr 75 Mg Xr24h-cap (Venlafaxine hcl) .... Take one by mouth three times a day 2)  Protonix  40 Mg Solr (Pantoprazole sodium) .... One tab by mouth 45 mins before brfst and supper. 3)  Toprol Xl 100 Mg Tb24 (Metoprolol succinate) .... Take one by mouth twice a day 4)  Levothyroxine Sodium 137 Mcg Tabs (Levothyroxine sodium) .... Take one by mouth daily 5)  Sg Enteric Coated Aspirin Tbec (Aspirin tbec) .... Take one 81 mg by mouth daily 6)  Vitamin C 500 Mg  Tabs (Ascorbic acid) .... Take one by mouth two times a day 7)  Vitamin E Caps (Vitamin e caps) .... Take one 500 international units by mouth two times a day 8)  Imipramine Hcl 25 Mg Tabs (Imipramine hcl) .Marland Kitchen.. 1 by mouth every evening 9)  Micardis Hct 80-25 Mg Tabs (Telmisartan-hctz) .Marland Kitchen.. 1 in am by mouth 10)  Valium 5 Mg Tabs (Diazepam) .... One tab by mouth every 4-6  hrs. 11)  Crestor 20 Mg Tabs (Rosuvastatin calcium) .... Take one tablet by mouth daily. 12)  Amlodipine Besylate 5 Mg Tabs (Amlodipine besylate) .... Take one tablet by mouth daily 13)  Vitamin D (ergocalciferol) 50000 Unit Caps (Ergocalciferol) .... One tab by mouth once weekly 14)  Metrogel 1 % Gel (Metronidazole) .... Apply to facial area two times a day as needed 15)  Promethazine Hcl 25 Mg Tabs (Promethazine hcl) .... One tab by mouth every 6 hrs as needed.  Patient Instructions: 1)  Go back to using the protonix.  You can get your results through our phone system.  Follow the instructions on the blue card.  Let me know if you need a form filled out to get the protonix.  Let us know what stomach medicine you have been taking.  Let me know if you aren't improving.  Take care.  Prescriptions: PROTONIX 40 MG SOLR (PANTOPRAZOLE SODIUM) one tab by mouth 45 mins before brfst and supper.  #60 Each x 5   Entered and Authorized by:   Elsie Stain MD   Signed by:   Elsie Stain MD on 11/02/2009   Method used:   Print then Give to Patient   RxIDFS:[id number]   Current Allergies (reviewed today): SULFA

## 2010-03-08 NOTE — Progress Notes (Signed)
Summary: N&V  Phone Note Call from Patient Call back at [phone]   [name]: Patient Call For: Raenette Rover MD Summary of Call: Pt has been taking care of daughter and grandson who has N&V. Pt started this morning with N&V and  both upper and lower stomach is hurting Pain level now is 8. No diarrhea and no fever.  Pt has just bought Emetrol and is going to try that for N&V. Pt has been able to keep down Sprite since she vomited the one time earlier this AM. Pt will continue sips of Sprite and ice chips. After 2 hours if still no vomiting pt will increase amount of clear liquids every 20 - 30 minutes. If no vomiting pt will gradually increase liquid intake and try dry toast or crackers or bland foods. In 8 hours if no vomitng will gradually go back to regular diet. Pt will call back if symptoms worsen or if at night will call a nurse line if needed. Pt said she felt comfortable in trying this course.  Initial call taken by: Ozzie Hoyle LPN,  January 27, 624THL 11:10 AM  Follow-up for Phone Call        If she needs it, pls call in phenergan as needed , could be tabs or supps. Follow-up by: Raenette Rover MD,  March 04, 2009 2:09 PM  Additional Follow-up for Phone Call Additional follow up Details #1::        Medication phoned to [facility] as instructed.Patient notified as instructed by telephone. Ozzie Hoyle LPN  January 27, 624THL 4:41 PM      New/Updated Medications: PROMETHAZINE HCL 25 MG TABS (PROMETHAZINE HCL) one tab by mouth every 6 hrs as needed. Prescriptions: PROMETHAZINE HCL 25 MG TABS (PROMETHAZINE HCL) one tab by mouth every 6 hrs as needed.  #10 x 0   Entered and Authorized by:   Raenette Rover MD   Signed by:   Emelia Salisbury LPN on [id number]   Method used:   Telephoned to ...       [facility] [address] [phone]* (retail)       [address], [place]  [address]       Ph: [phone]       Fax: [phone]   RxID:    [id number]

## 2010-03-08 NOTE — Letter (Signed)
Summary: Results Follow up Letter  [place] at [facility]  [address] [place], [place] [address]   Phone: [phone]  Fax: [phone]    06/30/2009 MRN: [id number]  [place] [address] [place], [place]  [address]  Dear Ms. Joyce,  The following are the results of your recent test(s):  Test         Result    Pap Smear:        Normal _____  Not Normal _____ Comments: ______________________________________________________ Cholesterol: LDL(Bad cholesterol):         Your goal is less than:         HDL (Good cholesterol):       Your goal is more than: Comments:  ______________________________________________________ Mammogram:        Normal __X___  Not Normal _____ Comments: Please repeat in one year.  ___________________________________________________________________ Hemoccult:        Normal _____  Not normal _______ Comments:    _____________________________________________________________________ Other Tests:    We routinely do not discuss normal results over the telephone.  If you desire a copy of the results, or you have any questions about this information we can discuss them at your next office visit.   Sincerely,    Teresa Pelton, MD

## 2010-03-08 NOTE — Assessment & Plan Note (Signed)
Summary: F/U DLO   Vital Signs:  Patient profile:   60 year old female Weight:      191.25 pounds BMI:     34.55 Temp:     98.1 degrees F oral Pulse rate:   72 / minute Pulse rhythm:   regular BP sitting:   124 / 78  (left arm) Cuff size:   large  Vitals Entered By: Emelia Salisbury LPN (May 10, [id number] [id number] PM) CC: Follow-up, has questions about Micardis   History of Present Illness: Pt is curr on Micardis 80-25 but has 7 weeks of plain 80mg  Micardis. Wants to know if she should throw them away.  Is currently taking Vit D 1000Iu daily after being therapeutic at 44 on 50000Iu weekly when last seen.  She also needs a mammogram. She feels well but also has redness and swelling of the prox area of the fingernail of the left middle finger and a pustule inside the left nostril. She deines fever.  Problems Prior to Update: 1)  Benign Positional Vertigo  (ICD-386.11) 2)  Abdominal Pain, Left Upper Quadrant  (ICD-789.02) 3)  Rosacea  (ICD-695.3) 4)  Unspecified Vitamin D Deficiency  (ICD-268.9) 5)  Chest Discomfort  (ICD-786.59) 6)  Obesity  (ICD-278.00) 7)  Hypertension  (ICD-401.9) 8)  Hyperlipidemia, Mixed  (ICD-272.2) 9)  Neuropathy  (ICD-355.9) 10)  Vaginitis, Candidal  (ICD-112.1) 11)  Erosive Esophagitis  (ICD-530.19) 12)  Erosive Gastritis  (ICD-535.40) 13)  Leg Pain, Right  (ICD-729.5) 14)  Adrenal Mass Via Ct  (ICD-255.8) 15)  Hyperglycemia  (ICD-790.6) 16)  Hypothyroidism  (ICD-244.9) 17)  Gerd  (ICD-530.81) 18)  Diverticulosis, Colon  (ICD-562.10) 19)  Anxiety  (ICD-300.00) 20)  Ibs  (ICD-564.1) 21)  Hx of Insomnia  (ICD-780.52) 22)  Hx of Ibs  (ICD-564.1) 23)  Other Screening Mammogram  (ICD-V76.12) 24)  Health Maintenance Exam  (ICD-V70.0)  Medications Prior to Update: 1)  Effexor Xr 75 Mg  Xr24h-Cap (Venlafaxine Hcl) .... Take One By Mouth Three Times A Day 2)  Protonix 40 Mg Solr (Pantoprazole Sodium) .... One Tab By Mouth 45 Mins Before Brfst and Supper. 3)   Toprol Xl 100 Mg Tb24 (Metoprolol Succinate) .... Take One By Mouth Twice A Day 4)  Levothyroxine Sodium 137 Mcg Tabs (Levothyroxine Sodium) .... Take One By Mouth Daily 5)  Sg Enteric Coated Aspirin  Tbec (Aspirin Tbec) .... Take One 81 Mg By Mouth Daily 6)  Vitamin C 500 Mg Tabs (Ascorbic Acid) .... Take One By Mouth Two Times A Day 7)  Vitamin E  Caps (Vitamin E Caps) .... Take One 500 International Units By Mouth Bid 8)  Imipramine Hcl 25 Mg  Tabs (Imipramine Hcl) .Marland Kitchen.. 1 By Mouth Every Evening 9)  Micardis Hct 80-25 Mg Tabs (Telmisartan-Hctz) .Marland Kitchen.. 1 in Am By Mouth 10)  Valium 5 Mg Tabs (Diazepam) .... One Tab By Mouth Every 4-6  Hrs. 11)  Crestor 20 Mg Tabs (Rosuvastatin Calcium) .... Take One Tablet By Mouth Daily. 12)  Amlodipine Besylate 5 Mg Tabs (Amlodipine Besylate) .... Take One Tablet By Mouth Daily 13)  Vitamin D (Ergocalciferol) 50000 Unit Caps (Ergocalciferol) .... One Tab By Mouth Once Weekly 14)  Metrogel 1 % Gel (Metronidazole) .... Apply To Facial Area Two Times A Day As Needed 15)  Promethazine Hcl 25 Mg Tabs (Promethazine Hcl) .... One Tab By Mouth Every 6 Hrs As Needed.  Allergies: 1)  Sulfa  Physical Exam  General:  Well-developed,well-nourished,in no acute distress; alert,appropriate and cooperative  throughout examination,   comfortable today when seen. Head:  Normocephalic and atraumatic without obvious abnormalities. No apparent alopecia or balding. Eyes:  Conjunctiva clear bilaterally. No nystagmus detected. Ears:  External ear exam shows no significant lesions or deformities.  Otoscopic examination reveals clear canals, tympanic membranes are intact bilaterally without bulging, retraction, inflammation or discharge. Hearing is grossly normal bilaterally. Nose:  External nasal examination shows no deformity or inflammation. Nasal mucosa are pink and moist without lesions or exudates. Smal;l erythem pustule inside left nostril. Mouth:  Oral mucosa and oropharynx  without lesions or exudates.  Teeth in good repair. Neck:  No deformities, masses, or tenderness noted. Lungs:  Normal respiratory effort, chest expands symmetrically. Lungs are clear to auscultation, no crackles or wheezes. Heart:  Normal rate and regular rhythm. S1 and S2 normal without gallop, murmur, click, rub or other extra sounds. Extremities:  middle finger of left hand redness and mild swelling of radial side of proximal nail bed, no purulence or expressable fluid.   Impression & Recommendations:  Problem # 1:  UNSPECIFIED VITAMIN D DEFICIENCY (ICD-268.9) Take Vit D 50000Iu weekly for what she has left and then start increased dose of Vit D 1000Iu three times a day.   Problem # 2:  CELLULITIS AND ABSCESS OF L MIDDLE FINGER (ICD-681.9) Assessment: New  Soak agressively at least 4 times a day in HOT water. Apply heat to nostril.  Call if sxs worsen.  Problem # 3:  HYPERTENSION (ICD-401.9) Assessment: Unchanged Well controlled. Take her plain Micardis with separate 25mg  HCTZ pill until gone. The following medications were removed from the medication list:    Hydrochlorothiazide 25 Mg Tabs (Hydrochlorothiazide) ..... One tab by mouth qd Her updated medication list for this problem includes:    Toprol Xl 100 Mg Tb24 (Metoprolol succinate) .Marland Kitchen... Take one by mouth twice a day    Micardis Hct 80-25 Mg Tabs (Telmisartan-hctz) .Marland Kitchen... 1 in am by mouth    Amlodipine Besylate 5 Mg Tabs (Amlodipine besylate) .Marland Kitchen... Take one tablet by mouth daily  BP today: 124/78 Prior BP: 122/82 (01/06/2009)  Labs Reviewed: K+: 4.1 (08/18/2008) Creat: : 0.9 (08/18/2008)   Chol: 269 (08/18/2008)   HDL: 44.90 (08/18/2008)   LDL: DEL (02/12/2008)   TG: 176.0 (08/18/2008)  Complete Medication List: 1)  Effexor Xr 75 Mg Xr24h-cap (Venlafaxine hcl) .... Take one by mouth three times a day 2)  Protonix 40 Mg Solr (Pantoprazole sodium) .... One tab by mouth 45 mins before brfst and supper. 3)  Toprol Xl 100  Mg Tb24 (Metoprolol succinate) .... Take one by mouth twice a day 4)  Levothyroxine Sodium 137 Mcg Tabs (Levothyroxine sodium) .... Take one by mouth daily 5)  Sg Enteric Coated Aspirin Tbec (Aspirin tbec) .... Take one 81 mg by mouth daily 6)  Vitamin C 500 Mg Tabs (Ascorbic acid) .... Take one by mouth two times a day 7)  Vitamin E Caps (Vitamin e caps) .... Take one 500 international units by mouth two times a day 8)  Imipramine Hcl 25 Mg Tabs (Imipramine hcl) .Marland Kitchen.. 1 by mouth every evening 9)  Micardis Hct 80-25 Mg Tabs (Telmisartan-hctz) .Marland Kitchen.. 1 in am by mouth 10)  Valium 5 Mg Tabs (Diazepam) .... One tab by mouth every 4-6  hrs. 11)  Crestor 20 Mg Tabs (Rosuvastatin calcium) .... Take one tablet by mouth daily. 12)  Amlodipine Besylate 5 Mg Tabs (Amlodipine besylate) .... Take one tablet by mouth daily 13)  Vitamin D (ergocalciferol) 50000 Unit  Caps (Ergocalciferol) .... One tab by mouth once weekly 14)  Metrogel 1 % Gel (Metronidazole) .... Apply to facial area two times a day as needed 15)  Promethazine Hcl 25 Mg Tabs (Promethazine hcl) .... One tab by mouth every 6 hrs as needed.  Other Orders: Radiology Referral (Radiology)  Patient Instructions: 1)  Refer for Mammo. Prescriptions: HYDROCHLOROTHIAZIDE 25 MG TABS (HYDROCHLOROTHIAZIDE) one tab by mouth qd  #30 x 1   Entered and Authorized by:   Raenette Rover MD   Signed by:   Raenette Rover MD on 06/15/2009   Method used:   Print then Give to Patient   [id number]   Current Allergies (reviewed today): SULFA

## 2010-03-08 NOTE — Consult Note (Signed)
Summary: Elizabeth Rivera,GI,Note   Imported By: Virgia Land 05/05/2009 16:20:45  _____________________________________________________________________  External Attachment:    Type:   Image     Comment:   External Document

## 2010-05-04 ENCOUNTER — Other Ambulatory Visit: Payer: Self-pay | Admitting: *Deleted

## 2010-05-04 MED ORDER — TELMISARTAN-HCTZ 80-25 MG PO TABS: ORAL_TABLET | ORAL | Status: DC

## 2010-05-04 MED ORDER — AMLODIPINE BESYLATE 5 MG PO TABS: ORAL_TABLET | ORAL | Status: DC

## 2010-05-04 MED ORDER — ROSUVASTATIN CALCIUM 20 MG PO TABS: ORAL_TABLET | ORAL | Status: DC

## 2010-05-04 MED ORDER — METOPROLOL SUCCINATE ER 100 MG PO TB24: ORAL_TABLET | ORAL | Status: DC

## 2010-05-04 MED ORDER — HYDROCHLOROTHIAZIDE 25 MG PO TABS: ORAL_TABLET | ORAL | Status: DC

## 2010-05-04 MED ORDER — IMIPRAMINE HCL 25 MG PO TABS: ORAL_TABLET | ORAL | Status: DC

## 2010-05-04 MED ORDER — LEVOTHYROXINE SODIUM 137 MCG PO TABS: ORAL_TABLET | ORAL | Status: DC

## 2010-05-04 MED ORDER — VENLAFAXINE HCL ER 75 MG PO CP24: ORAL_CAPSULE | ORAL | Status: DC

## 2010-05-04 NOTE — Telephone Encounter (Signed)
Please have pt come in to be seen as soon as is practical. Will need followup labwork in the near future.

## 2010-05-05 ENCOUNTER — Other Ambulatory Visit: Payer: Self-pay | Admitting: Family Medicine

## 2010-05-05 ENCOUNTER — Telehealth: Payer: Self-pay | Admitting: *Deleted

## 2010-05-05 MED ORDER — TELMISARTAN-HCTZ 80-25 MG PO TABS: ORAL_TABLET | ORAL | Status: DC

## 2010-05-05 MED ORDER — VENLAFAXINE HCL ER 75 MG PO CP24: ORAL_CAPSULE | ORAL | Status: DC

## 2010-05-05 MED ORDER — HYDROCHLOROTHIAZIDE 25 MG PO TABS: ORAL_TABLET | ORAL | Status: DC

## 2010-05-05 MED ORDER — AMLODIPINE BESYLATE 5 MG PO TABS: ORAL_TABLET | ORAL | Status: DC

## 2010-05-05 MED ORDER — ROSUVASTATIN CALCIUM 20 MG PO TABS: ORAL_TABLET | ORAL | Status: DC

## 2010-05-05 MED ORDER — IMIPRAMINE HCL 25 MG PO TABS: ORAL_TABLET | ORAL | Status: DC

## 2010-05-05 MED ORDER — LEVOTHYROXINE SODIUM 137 MCG PO TABS: ORAL_TABLET | ORAL | Status: DC

## 2010-05-05 MED ORDER — METOPROLOL SUCCINATE ER 100 MG PO TB24: ORAL_TABLET | ORAL | Status: DC

## 2010-05-06 NOTE — Telephone Encounter (Signed)
Rx left up front for patient to pick up .

## 2010-05-06 NOTE — Telephone Encounter (Signed)
Advised patient that rx are ready. Also scheduled appt for next week.

## 2010-05-07 ENCOUNTER — Encounter: Payer: Self-pay | Admitting: Family Medicine

## 2010-05-11 ENCOUNTER — Ambulatory Visit (INDEPENDENT_AMBULATORY_CARE_PROVIDER_SITE_OTHER): Payer: BC Managed Care – PPO | Admitting: Family Medicine

## 2010-05-11 ENCOUNTER — Encounter: Payer: Self-pay | Admitting: Family Medicine

## 2010-05-11 DIAGNOSIS — I10 Essential (primary) hypertension: Secondary | ICD-10-CM

## 2010-05-11 DIAGNOSIS — E559 Vitamin D deficiency, unspecified: Secondary | ICD-10-CM

## 2010-05-11 DIAGNOSIS — E538 Deficiency of other specified B group vitamins: Secondary | ICD-10-CM | POA: Insufficient documentation

## 2010-05-11 DIAGNOSIS — K219 Gastro-esophageal reflux disease without esophagitis: Secondary | ICD-10-CM

## 2010-05-11 DIAGNOSIS — R600 Localized edema: Secondary | ICD-10-CM | POA: Insufficient documentation

## 2010-05-11 LAB — VITAMIN B12: Vitamin B-12: 163 pg/mL — ABNORMAL LOW (ref 211–911)

## 2010-05-11 MED ORDER — HYDROCHLOROTHIAZIDE 25 MG PO TABS: ORAL_TABLET | ORAL | Status: DC

## 2010-05-11 NOTE — Patient Instructions (Addendum)
RTC one month for BP check, BMet prior. Stop Amlodipine. Start HCTZ 25mg  at [facility].  Check Vit D lvl today and start Vit D OTC 2000Iu twice a day. Check reaction to HCTZ next time vs Sulfonamide reaction.

## 2010-05-11 NOTE — Progress Notes (Signed)
S: Pt here for medication refills and followup. She needs refills sent in and is having some retention of hands and feet typically at the end of the day. She has been on Hctz alone in the past but has been off of it independently since including it in her ARB. She doesn't think it has been working as well since that change. She otherwise feels she has been doing ok. She has no other significant complaints. She has had difficulty keeping her Vit D lvl up, did well on 50000IU weekly but then became deficient again on daily 1000 bid. She is currently on weekly tx again. She also has been on PPI tx for a while and has not had a B12 checked. O: WDWN WF NAD HEENT Wnl Heart RRR w/o m Lungs CTA EXTR w/o CCE A: GERD     Vit D Def      HTN P; Vit D and B12 today. Add back HCTZ for now abnd recheck Bmet 1 month. Stop Amlodipine for now and reassess LE edema by pt history. Assume Vit D nml and start OTC 2000Iu bid.

## 2010-05-11 NOTE — Assessment & Plan Note (Signed)
Will reche3ck lvl today and then start Vit D 2000 IU twicw a day.

## 2010-05-12 MED ORDER — HYDROCHLOROTHIAZIDE 25 MG PO TABS: ORAL_TABLET | ORAL | Status: DC

## 2010-05-17 ENCOUNTER — Telehealth: Payer: Self-pay | Admitting: *Deleted

## 2010-05-17 NOTE — Telephone Encounter (Signed)
Agree 

## 2010-05-17 NOTE — Telephone Encounter (Signed)
Pharmacy is asking if ok to change scripts for toprol and effexor, dated 3/29,  to 90 days with 3 refills.  Advised ok.

## 2010-05-24 LAB — POCT URINALYSIS DIP (DEVICE)
Glucose, UA: NEGATIVE mg/dL
Ketones, ur: NEGATIVE mg/dL
Specific Gravity, Urine: <=1.005 (ref 1.005–1.030)

## 2010-06-06 ENCOUNTER — Other Ambulatory Visit: Payer: Self-pay | Admitting: Family Medicine

## 2010-06-06 DIAGNOSIS — I10 Essential (primary) hypertension: Secondary | ICD-10-CM

## 2010-06-10 ENCOUNTER — Other Ambulatory Visit (INDEPENDENT_AMBULATORY_CARE_PROVIDER_SITE_OTHER): Payer: BC Managed Care – PPO | Admitting: Family Medicine

## 2010-06-10 DIAGNOSIS — I10 Essential (primary) hypertension: Secondary | ICD-10-CM

## 2010-06-10 LAB — BASIC METABOLIC PANEL
CO2: 30 mEq/L (ref 19–32)
Glucose, Bld: 109 mg/dL — ABNORMAL HIGH (ref 70–99)
Potassium: 4 mEq/L (ref 3.5–5.1)
Sodium: 136 mEq/L (ref 135–145)

## 2010-06-16 ENCOUNTER — Ambulatory Visit (INDEPENDENT_AMBULATORY_CARE_PROVIDER_SITE_OTHER): Payer: BC Managed Care – PPO | Admitting: Family Medicine

## 2010-06-16 ENCOUNTER — Encounter: Payer: Self-pay | Admitting: Family Medicine

## 2010-06-16 DIAGNOSIS — E538 Deficiency of other specified B group vitamins: Secondary | ICD-10-CM

## 2010-06-16 DIAGNOSIS — E559 Vitamin D deficiency, unspecified: Secondary | ICD-10-CM

## 2010-06-16 DIAGNOSIS — Z1231 Encounter for screening mammogram for malignant neoplasm of breast: Secondary | ICD-10-CM

## 2010-06-16 DIAGNOSIS — I10 Essential (primary) hypertension: Secondary | ICD-10-CM

## 2010-06-16 DIAGNOSIS — R609 Edema, unspecified: Secondary | ICD-10-CM

## 2010-06-16 DIAGNOSIS — R600 Localized edema: Secondary | ICD-10-CM

## 2010-06-16 NOTE — Assessment & Plan Note (Signed)
Adequate. Cont meds as curr taking. BP Readings from Last 3 Encounters:  06/16/10 138/84  05/11/10 130/76  11/25/09 156/100

## 2010-06-16 NOTE — Progress Notes (Signed)
  Subjective:    Patient ID: Elizabeth Rivera, female    DOB: [birth year], 60 y.o.   MRN: [id number]  HPI Pt here for recheck. She still has swelling altho better, gone in the morning and worst in the afternoon. Much better than before.  We had her stop Amlodipine at night and takes HCTZ at lunch.  She has begun B12 1000mg  daily. She is having no probs. She is taking Vit D OTC 2000u twice a day.  She feels well and has no complaints.    Review of Systems  Constitutional: Negative for fever, chills, diaphoresis, activity change and fatigue.  HENT: Negative for ear pain, congestion, rhinorrhea and postnasal drip.   Eyes: Negative for redness.  Respiratory: Negative for cough, chest tightness, shortness of breath and wheezing.   Cardiovascular: Negative for chest pain.       Objective:   Physical Exam  Constitutional: She appears well-developed and well-nourished. No distress.  HENT:  Head: Normocephalic and atraumatic.  Right Ear: External ear normal.  Left Ear: External ear normal.  Nose: Nose normal.  Mouth/Throat: Oropharynx is clear and moist. No oropharyngeal exudate.  Eyes: Conjunctivae and EOM are normal. Pupils are equal, round, and reactive to light.  Neck: Normal range of motion. Neck supple. No thyromegaly present.  Cardiovascular: Normal rate, regular rhythm and normal heart sounds.   Pulmonary/Chest: Effort normal and breath sounds normal. She has no wheezes. She has no rales.  Lymphadenopathy:    She has no cervical adenopathy.  Skin: She is not diaphoretic.          Assessment & Plan:

## 2010-06-16 NOTE — Assessment & Plan Note (Signed)
Tolerating OTC 1000mg  daily. Will recheck in future.

## 2010-06-16 NOTE — Assessment & Plan Note (Signed)
Edema less, resolved in AM, much less at worst at night. Cont meds as is.

## 2010-06-16 NOTE — Assessment & Plan Note (Signed)
Tolerating OTC 2000 bid fine. Cont. Will recheck level in future.

## 2010-06-16 NOTE — Patient Instructions (Signed)
Refer for Mammo to [facility]. Pls schedule Comp Exam, labs prior when available

## 2010-06-21 NOTE — Assessment & Plan Note (Signed)
[facility] OFFICE NOTE   NAME:Elizabeth Rivera, Elizabeth Rivera                       MRN:          [id number]  DATE:05/25/2008                            DOB:          Jan 21, 1951    PRIMARY CARE PHYSICIAN:  Modesto Charon, MD, [place], [facility]   HISTORY OF PRESENT ILLNESS:  This is a 60 year old with a history of  hypertension, hyperlipidemia, and hypothyroidism who presents to  Cardiology Clinic for evaluation of chest pain.  The patient has a  history of 2-3 weeks of central chest pressure.  She says it feels like  someone is standing on her chest and has not let up at all over the last  2-3 weeks.  The patient has been seeing Dr. Council Mechanic recently.  She has  had a lot of increased stress in her life lately around her job and also  in her family and Dr. Council Mechanic has been treating her for anxiety.  He  gave her Valium 5 mg to take every day and she says that the chest pain  may have been helped some with the Valium, but has not gone completely  away.  She thinks that the chest pressure gets worse when she exerts  herself, especially at work.  However, it is really always there, and  she is having difficulty sleeping at night.  When she initially saw Dr.  Council Mechanic, it was noted that she had EKG changes compared to her prior  old EKG which was from a number of years ago.  She did have T-wave  inversions inferiorly and anterolaterally.  The EKG was repeated the  next time she saw Dr. Council Mechanic and EKG changes persisted.  She was  referred over to Cardiology for evaluation.  Aside of the chest  pressure, the patient does report some shortness of breath with moderate  exertion.  No orthopnea or PND.  She has had a stress test in the past  when she is saw Dr. Ronnald Collum; however, I do not know the results of that  test.  Prior to 2 or 3 weeks ago, the patient was not having any chest  pressure at all.   PAST MEDICAL HISTORY:  1. Anxiety.  2. Diverticulosis.  3. Irritable bowel syndrome.  4. Hypertension.  5. Hyperlipidemia.  6. Erosive esophagitis.  7. Gastroesophageal reflux disease with history of Barrett esophagus.  8. Neuropathy.  9. Hypothyroidism.  10.Obesity.   SOCIAL HISTORY:  The patient is married.  She does not smoke.  She does  not drink alcohol.  She has one daughter.   FAMILY HISTORY:  She works at [employer] in Therapist, art.   FAMILY HISTORY:  The patient has a history of TIAs in her mother.  She  also said that her mother had a light MI in her 79s.   REVIEW OF SYSTEMS:  All systems are reviewed and negative except as  noted in the history of present illness.   MEDICATIONS:  1. Effexor 75 mg.  2. Prilosec 20 mg b.i.d.  3. Toprol-XL 100 mg b.i.d.  4. Levoxyl 137 mcg daily.  5. Crestor 10 mg daily.  6. Aspirin 81 mg daily.  7. Telmisartan and HCTZ 80/25 b.i.d.   EKG was reviewed as normal sinus rhythm with inferior T-wave inversions  and also T-wave inversions in the anterolateral leads.  No labs were  available for our review.   PHYSICAL EXAMINATION:  VITAL SIGNS:  Blood pressure is 132/86, heart  rate is 77 and regular.  GENERAL:  This is a obese female in no apparent distress.  NEUROLOGIC:  Alert and oriented x3.  LUNGS:  Clear to auscultation bilaterally.  Normal respiratory effort.  CARDIOVASCULAR:  Heart regular S1 and S2.  Soft S4.  No S3.  No murmur.  No peripheral edema.  2+ posterior tibial pulses bilaterally.  No  carotid bruit.  ABDOMEN:  Soft, nontender.  No hepatosplenomegaly.  EXTREMITIES:  No clubbing or cyanosis.  SKIN:  Normal exam.  MUSCULOSKELETAL:  Normal exam.  HEENT:  Normal exam.  NECK:  There is no JVD.  There is no thyromegaly or thyroid nodule.   ASSESSMENT AND PLAN:  This is a 60 year old with history of  hypertension, hyperlipidemia who presents for evaluation of atypical  chest pain.  1. Chest pain.  The patient's chest pain is quite  atypical and has      been present for 2-3 weeks constantly.  It waxes and wanes, but      never goes away completely.  She has some relief with the Valium      and she thinks pain is somewhat worse with moderate exertion, but      that seems to be only at work.  It is possible that the chest pain      is somewhat related to stress and that it is relieved with Valium      somewhat.  However, she does certainly have some T-wave inversions      on her EKG.  It is different per report from the prior EKG;      however, that was done a number of years ago.  Her risk factors for      coronary artery disease include hypertension, hyperlipidemia.  She      also possibly has an early family history of premature coronary      artery disease in her mother.  Given the ongoing symptoms, I do      think it is reasonable to obtain a Myoview exam to look for any      evidence of ischemia.  If this is negative, then in that case, I      think it would be reasonable to pursue a GI workup.  She does have      a history of gastroesophageal reflux disease with erosive      esophagitis and Barretts esophagus.  She is on a proton pump      inhibitor.  For now, the patient should continue on her aspirin 81      mg today.  She is on a statin, an ARB, and a beta-blocker.  2. Hypertension.  The patient's blood pressure is under reasonably      good control today.  She will continue on her current regimen.  3. Hyperlipidemia.  We will check and see if the patient has had      lipids done recently, and if not, we will obtain the next set of      fasting lipids.     Loralie Champagne, MD  Electronically Signed  DM/MedQ  DD: 05/25/2008  DT: 05/26/2008  Job #: [id number]   cc:   Modesto Charon, MD

## 2010-06-21 NOTE — Assessment & Plan Note (Signed)
[facility] OFFICE NOTE   NAME:Littman, DMANI FRANCESCHINI                       MRN:          [id number]  DATE:06/08/2008                            DOB:          Jan 25, 1951    PRIMARY CARE PHYSICIAN:  Modesto Charon, MD   HISTORY OF PRESENT ILLNESS:  This is a 60 year old with a history of  hypertension, hyperlipidemia, and hypothyroidism who came initially to  Cardiology Clinic on May 25, 2008, for evaluation of chest pain.  The  patient has had a history of 2-3 weeks of central chest pressure.  She  felt like someone is standing on her chest.  The pain actually has been  constant and has not let up for 2-3 weeks.  She has been under a lot of  stress in her life and she also has a history of erosive esophagitis and  gastroesophageal reflux disease.  Given the patient's risk factors and  abnormal EKG with some flipped Ts, we did do an adenosine Myoview study,  this showed EF of 68% with normal perfusion, this was a normal stress  test.  The patient continues to have fairly constant chest pain.  She  says it is better than it had been.  She thinks the Valium has helped  some.  She does now tell me that it seems to be worse with eating.  She  says that she especially notices it being worse when she eats cucumbers  and melons.  She does have a history of erosive esophagitis and  gastroesophageal reflux disease and she has had a cholecystectomy.  The  patient's main complaint today is really not the chest pain it is  actually headache.  Her blood pressure today is 155/98.   PAST MEDICAL HISTORY:  1. Anxiety.  2. Diverticulosis.  3. Irritable bowel syndrome.  4. Hypertension.  5. Hyperlipidemia.  6. Erosive esophagitis.  7. Gastroesophageal reflux disease with history of Barrett esophagus.  8. Neuropathy.  9. Hyperthyroidism.  10.Obesity.  11.History of cholecystectomy.  12.Adenosine Myoview done in April 2010, EF  68%, this is a normal      study with normal perfusion.   SOCIAL HISTORY:  The patient is married.  She does not smoke.  She does  not drink alcohol.  She has one daughter.  She works at [employer] in  Therapist, art.   MEDICATIONS:  1. Prilosec 20 mg twice a day.  2. Toprol-XL 100 mg twice a day.  3. Crestor 10 mg daily.  4. Aspirin 81 mg daily.  5. Telmisartan/hydrochlorothiazide 80/25 mg daily.   Most recent labs from January 2010, triglycerides 128, HDL 36.1, and LDL  165.   PHYSICAL EXAMINATION:  VITAL SIGNS:  Blood pressure 155/98, heart rate  68 and regular.  GENERAL:  This is an obese female in no apparent distress.  NEUROLOGIC:  Alert and oriented x3.  Normal affect.  LUNGS:  Clear to auscultation bilaterally with normal respiratory  effort.  CARDIOVASCULAR:  Heart, regular S1 and S2.  Soft S4.  No S3.  No murmur.  No  peripheral edema.  2+ posterior tibial pulses bilaterally.  No  carotid bruits.  ABDOMEN:  Soft and nontender.  No hepatosplenomegaly.  EXTREMITIES:  No clubbing or cyanosis.  NECK:  There is no JVD.  There is no thyromegaly or thyroid nodule.   ASSESSMENT AND PLAN:  This is a 60 year old with a history of  hypertension and hyperlipidemia who presented initially for evaluation  of atypical chest pain.  1. Chest pain.  The patient's chest pain is quite atypical.  Given her      risk factors, we did do a Adenosine Myoview, which was the normal      study with normal systolic function and normal perfusion.  She says      the pain is sometimes worse with exertion, sometimes it is worse      with meals.  She particularly notices it after eating cucumbers and      melons.  She has had a cholecystectomy.  Given her GI history with      erosive esophagitis, I do wonder if this constant chest pain could      be due to erosive esophagitis or peptic ulcer disease.  I do think      it would be a good idea for her to have an EGD.  She was seeing Dr.      Vira Agar  for GI evaluation in the past.  I am going to refer her      back over to see Dr. Vira Agar for possible EGD.  She will continue      her Prilosec.  2. Hypertension.  The patient's blood pressure is high today.  I am      going to add Norvasc 5 mg daily to her regimen.  We will call the      patient in 2 weeks.  She will record her blood pressure and we will      see what her control is.  If needed we will titrate up her Norvasc.  3. Hyperlipidemia.  The patient's last LDL was 165 in January 2010.      She was on Crestor 10 mg daily at that time.  We will increase her      Crestor to 20 mg daily.  We will followup her lipids and LFTs in 3      months.  4. I will see the patient back in followup in 6 months.     Loralie Champagne, MD  Electronically Signed    DM/MedQ  DD: 06/08/2008  DT: 06/09/2008  Job #: [id number]   cc:   Modesto Charon, MD

## 2010-07-06 ENCOUNTER — Other Ambulatory Visit: Payer: Self-pay | Admitting: Family Medicine

## 2010-07-06 DIAGNOSIS — K5730 Diverticulosis of large intestine without perforation or abscess without bleeding: Secondary | ICD-10-CM

## 2010-07-06 DIAGNOSIS — E538 Deficiency of other specified B group vitamins: Secondary | ICD-10-CM

## 2010-07-06 DIAGNOSIS — R7989 Other specified abnormal findings of blood chemistry: Secondary | ICD-10-CM

## 2010-07-06 DIAGNOSIS — I10 Essential (primary) hypertension: Secondary | ICD-10-CM

## 2010-07-06 DIAGNOSIS — E782 Mixed hyperlipidemia: Secondary | ICD-10-CM

## 2010-07-06 DIAGNOSIS — E559 Vitamin D deficiency, unspecified: Secondary | ICD-10-CM

## 2010-07-06 DIAGNOSIS — E039 Hypothyroidism, unspecified: Secondary | ICD-10-CM

## 2010-07-06 DIAGNOSIS — E278 Other specified disorders of adrenal gland: Secondary | ICD-10-CM

## 2010-07-07 ENCOUNTER — Other Ambulatory Visit (INDEPENDENT_AMBULATORY_CARE_PROVIDER_SITE_OTHER): Payer: BC Managed Care – PPO | Admitting: Family Medicine

## 2010-07-07 DIAGNOSIS — R7989 Other specified abnormal findings of blood chemistry: Secondary | ICD-10-CM

## 2010-07-07 DIAGNOSIS — E278 Other specified disorders of adrenal gland: Secondary | ICD-10-CM

## 2010-07-07 DIAGNOSIS — E538 Deficiency of other specified B group vitamins: Secondary | ICD-10-CM

## 2010-07-07 DIAGNOSIS — K5730 Diverticulosis of large intestine without perforation or abscess without bleeding: Secondary | ICD-10-CM

## 2010-07-07 DIAGNOSIS — E039 Hypothyroidism, unspecified: Secondary | ICD-10-CM

## 2010-07-07 DIAGNOSIS — E782 Mixed hyperlipidemia: Secondary | ICD-10-CM

## 2010-07-07 DIAGNOSIS — E559 Vitamin D deficiency, unspecified: Secondary | ICD-10-CM

## 2010-07-07 LAB — RENAL FUNCTION PANEL
Albumin: 4.3 g/dL (ref 3.5–5.2)
Calcium: 10.2 mg/dL (ref 8.4–10.5)
Chloride: 97 mEq/L (ref 96–112)
Potassium: 4.3 mEq/L (ref 3.5–5.1)

## 2010-07-07 LAB — HEPATIC FUNCTION PANEL
Alkaline Phosphatase: 103 U/L (ref 39–117)
Bilirubin, Direct: 0.2 mg/dL (ref 0.0–0.3)
Total Protein: 7.8 g/dL (ref 6.0–8.3)

## 2010-07-07 LAB — MICROALBUMIN / CREATININE URINE RATIO
Creatinine,U: 159.7 mg/dL
Microalb Creat Ratio: 1.5 mg/g (ref 0.0–30.0)
Microalb, Ur: 2.4 mg/dL — ABNORMAL HIGH (ref 0.0–1.9)

## 2010-07-07 LAB — CBC WITH DIFFERENTIAL/PLATELET
Basophils Relative: 0.3 % (ref 0.0–3.0)
Eosinophils Relative: 3.1 % (ref 0.0–5.0)
Lymphocytes Relative: 27.8 % (ref 12.0–46.0)
Neutrophils Relative %: 61.6 % (ref 43.0–77.0)
RBC: 4.65 Mil/uL (ref 3.87–5.11)
WBC: 5 10*3/uL (ref 4.5–10.5)

## 2010-07-07 LAB — T4, FREE: Free T4: 1.45 ng/dL (ref 0.60–1.60)

## 2010-07-07 LAB — LIPID PANEL
Total CHOL/HDL Ratio: 5
VLDL: 31.2 mg/dL (ref 0.0–40.0)

## 2010-07-08 LAB — VITAMIN D 25 HYDROXY (VIT D DEFICIENCY, FRACTURES): Vit D, 25-Hydroxy: 47 ng/mL (ref 30–89)

## 2010-07-14 ENCOUNTER — Ambulatory Visit (INDEPENDENT_AMBULATORY_CARE_PROVIDER_SITE_OTHER): Payer: BC Managed Care – PPO | Admitting: Family Medicine

## 2010-07-14 ENCOUNTER — Encounter: Payer: Self-pay | Admitting: Family Medicine

## 2010-07-14 VITALS — BP 124/80 | HR 64 | Temp 97.1°F | Ht 62.5 in | Wt 183.0 lb

## 2010-07-14 DIAGNOSIS — E782 Mixed hyperlipidemia: Secondary | ICD-10-CM

## 2010-07-14 DIAGNOSIS — R600 Localized edema: Secondary | ICD-10-CM

## 2010-07-14 DIAGNOSIS — E039 Hypothyroidism, unspecified: Secondary | ICD-10-CM

## 2010-07-14 DIAGNOSIS — N644 Mastodynia: Secondary | ICD-10-CM

## 2010-07-14 DIAGNOSIS — R7989 Other specified abnormal findings of blood chemistry: Secondary | ICD-10-CM

## 2010-07-14 DIAGNOSIS — R0789 Other chest pain: Secondary | ICD-10-CM

## 2010-07-14 DIAGNOSIS — I10 Essential (primary) hypertension: Secondary | ICD-10-CM

## 2010-07-14 DIAGNOSIS — E559 Vitamin D deficiency, unspecified: Secondary | ICD-10-CM

## 2010-07-14 DIAGNOSIS — R609 Edema, unspecified: Secondary | ICD-10-CM

## 2010-07-14 MED ORDER — LEVOTHYROXINE SODIUM 125 MCG PO TABS: ORAL_TABLET | ORAL | Status: DC

## 2010-07-14 MED ORDER — ROSUVASTATIN CALCIUM 20 MG PO TABS: ORAL_TABLET | ORAL | Status: DC

## 2010-07-14 NOTE — Progress Notes (Signed)
  Subjective:    Patient ID: Elizabeth Rivera, female    DOB: 11/21/1950, 60 y.o.   MRN: [id number]  HPI Pt here for COMP EXAM. Her legs are better after starting diuretic. Her BP is good today. 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 Had seen Dr Laurey Morale for many years w/o abnml. She has stopped her Crestor. She ran out. She has been out 4-5 weeks.     Review of Systems  Constitutional: Positive for fatigue (mild, tired in AM upon awakening. Does not snore. ). Negative for fever, chills, diaphoresis, activity change, appetite change and unexpected weight change.  HENT: Negative for hearing loss, ear pain, nosebleeds, rhinorrhea, trouble swallowing, tinnitus and ear discharge.   Eyes: Negative for pain, discharge, redness and visual disturbance.  Respiratory: Negative.  Negative for cough, chest tightness, shortness of breath and wheezing.   Cardiovascular: Negative for chest pain, palpitations and leg swelling.  Gastrointestinal: Positive for abdominal pain (occs around the umbilicus. Told in past has small umbilical hernia.). Negative for nausea, vomiting, diarrhea, constipation, blood in stool and abdominal distention.  Genitourinary: Negative for dysuria and frequency.  Musculoskeletal: Negative for myalgias, back pain and arthralgias.  Skin: Negative for rash.  Neurological: Negative for dizziness, tremors, syncope and numbness.  Hematological: Negative for adenopathy. Does not bruise/bleed easily.  Psychiatric/Behavioral: Negative for agitation. The patient is hyperactive.        Objective:   Physical Exam  Constitutional: She is oriented to person, place, and time. She appears well-developed and well-nourished. No distress.  HENT:  Head: Normocephalic and atraumatic.  Left Ear: External ear normal.  Nose: Nose normal.  Mouth/Throat: Oropharynx is clear and moist. No oropharyngeal exudate.  Eyes: Conjunctivae and  EOM are normal. Pupils are equal, round, and reactive to light. No scleral icterus.  Neck: Normal range of motion. Neck supple. No thyromegaly present.  Cardiovascular: Normal rate, regular rhythm and normal heart sounds.  Exam reveals no friction rub.   No murmur heard. Pulmonary/Chest: Effort normal and breath sounds normal. No respiratory distress. She has no wheezes. She has no rales.       Breasts NT w/o mass, nipple discharge or axillary adenopathy except for some tenderness over the medial limitation of her left breast reduction scar. No fluctulance or inflammation noted.  Abdominal: Soft. Bowel sounds are normal. She exhibits no mass. There is no tenderness.  Musculoskeletal: Normal range of motion. She exhibits no edema and no tenderness.  Lymphadenopathy:    She has no cervical adenopathy.  Neurological: She is alert and oriented to person, place, and time. She has normal reflexes.  Skin: Skin is warm and dry. No rash noted. She is not diaphoretic. No erythema.  Psychiatric: She has a normal mood and affect. Her behavior is normal. Judgment and thought content normal.          Assessment & Plan:

## 2010-07-14 NOTE — Assessment & Plan Note (Signed)
New location over reduction scar of left breast medially. Due to familial incidence of breast cancer, will have diagnostic mammo done and poss u/s.

## 2010-07-14 NOTE — Assessment & Plan Note (Signed)
Overcontrolled. Will decrease Levo to

## 2010-07-14 NOTE — Assessment & Plan Note (Signed)
Good control. Cont curr tx. BP Readings from Last 3 Encounters:  07/14/10 124/80  06/16/10 138/84  05/11/10 130/76

## 2010-07-14 NOTE — Assessment & Plan Note (Signed)
LDL astronomical. Has been off Crestor a few weeks due to running out. Script sent. Will recheck next year. She has tolerated this medication well, including liver.

## 2010-07-14 NOTE — Patient Instructions (Addendum)
Refer for breast mammo, poss u/s due to pain in breast. Will call pt and have her recheck TSH in 3 mos on new dose.

## 2010-07-14 NOTE — Progress Notes (Signed)
Addended by: Modesto Charon on: 07/14/2010 05:47 PM   Modules accepted: Orders

## 2010-07-14 NOTE — Assessment & Plan Note (Addendum)
Adequate. Cont careful diet. Fasting BS 102.

## 2010-07-14 NOTE — Assessment & Plan Note (Signed)
Improved. Cont curr replacement.

## 2010-07-14 NOTE — Assessment & Plan Note (Signed)
Improved. Cont curr meds.

## 2010-07-20 ENCOUNTER — Ambulatory Visit: Payer: Self-pay | Admitting: Family Medicine

## 2010-07-22 ENCOUNTER — Telehealth: Payer: Self-pay | Admitting: *Deleted

## 2010-07-22 NOTE — Telephone Encounter (Signed)
Advised pt of benign mammogram and ultrasound results from 07/20/10.  Reports placed up front to be scanned.

## 2010-07-25 ENCOUNTER — Encounter: Payer: Self-pay | Admitting: Family Medicine

## 2010-07-28 ENCOUNTER — Encounter: Payer: Self-pay | Admitting: Cardiovascular Disease

## 2010-09-06 ENCOUNTER — Ambulatory Visit (INDEPENDENT_AMBULATORY_CARE_PROVIDER_SITE_OTHER): Payer: BC Managed Care – PPO | Admitting: Family Medicine

## 2010-09-06 ENCOUNTER — Encounter: Payer: Self-pay | Admitting: Family Medicine

## 2010-09-06 VITALS — BP 140/90 | HR 61 | Temp 97.9°F | Wt 181.0 lb

## 2010-09-06 DIAGNOSIS — J329 Chronic sinusitis, unspecified: Secondary | ICD-10-CM | POA: Insufficient documentation

## 2010-09-06 MED ORDER — AMOXICILLIN 500 MG PO CAPS: 500.0000 mg | ORAL_CAPSULE | Freq: Two times a day (BID) | ORAL | Status: AC

## 2010-09-06 NOTE — Patient Instructions (Signed)
Take antibiotic as directed.  Drink lots of fluids.  Treat sympotmatically with Mucinex, nasal saline irrigation, and Tylenol/Ibuprofen. Also try claritinor zyrtec over the counter- two times a day as needed). You can use warm compresses.   Call if not improving as expected in 5-7 days.

## 2010-09-06 NOTE — Progress Notes (Signed)
Subjective:     Elizabeth Rivera is a 60 y.o. female who presents for evaluation of sinus pain. Symptoms include: congestion, cough, headaches, nasal congestion, sneezing and sore throat. Onset of symptoms was 7 days ago. Symptoms have been gradually worsening since that time. Past history is significant for no history of pneumonia or bronchitis. Patient is a non-smoker.  Patient Active Problem List  Diagnoses  . HYPOTHYROIDISM  . ADRENAL MASS VIA CT  . UNSPECIFIED VITAMIN D DEFICIENCY  . HYPERLIPIDEMIA, MIXED  . OBESITY  . ANXIETY  . NEUROPATHY  . BENIGN POSITIONAL VERTIGO  . HYPERTENSION  . EROSIVE ESOPHAGITIS  . GERD  . EROSIVE GASTRITIS  . DIVERTICULOSIS, COLON  . IBS  . ROSACEA  . LEG PAIN, RIGHT  . INSOMNIA  . Other chest pain  . ABDOMINAL PAIN, LEFT UPPER QUADRANT  . HYPERGLYCEMIA  . Leg edema  . B12 deficiency  . Sinusitis   Past Medical History  Diagnosis Date  . Thyroid disease     hypothyroid  . Anxiety   . Hyperlipidemia   . Hypertension   . Other chest pain     chest discomfort  . Obesity   . Neuropathy   . Other esophagitis     erosive esophagitis  . Other specified gastritis without mention of hemorrhage     erosvie gastritis  . Pain in limb     Right leg pain  . Other specified disorders of adrenal glands 07/2007    adrenal mass via of CT 1.4cm left Adrenal no change(Dr.Cope)   . Other abnormal blood chemistry   . Diverticulosis of colon (without mention of hemorrhage) 05/20/2009    Internal hemms (Dr. Vira Agar)  . Irritable bowel syndrome     history of  . Insomnia, unspecified   . Nervous breakdown 08/2002    [facility]  . GERD (gastroesophageal reflux disease)     reflux HH 09/01/2002//egd reflux Esoph. HH Gastritis nonbleeding erosive gastropathy Duodentitis 08/15/2006  . Hyperglycemia   . Candidal vaginitis    Past Surgical History  Procedure Date  . Cholecystectomy 03/2004  . Abdominal hysterectomy 1995    Part Hyst BSO DUB  ovarian cyst B9  . Vaginal delivery 1977  . Breast reduction surgery 1984  . Cystoscopy 01/08/2008    Former site of inflammation healed (Dr. Jacqlyn Larsen)  . Nsvd x1 1977  . Esophagogastroduodenoscopy 1. 09/01/02  2. 08/15/06    1. Reflux, HH  2. Reflux esoph HH gaastritis nonbleeding erosive gastropathy duodenitis  . Colonoscopy 1. 08/15/06  2. 05/20/09    Int Hemms  2. Divertics Int Hemms (Dr. Vira Agar)  . Ct abd w & pelvis wo cm 6/09    CT Scan abd stable adrenal adenoma 1.4cm left adrenal no change (Dr. Jacqlyn Larsen)   History  Substance Use Topics  . Smoking status: Never Smoker   . Smokeless tobacco: Never Used  . Alcohol Use: No   Family History  Problem Relation Age of Onset  . Hypertension Mother   . Hyperlipidemia Mother   . Stroke Mother     mini strokes  . Vision loss Father     vision problems  . Cancer Father     Colon   . Hyperlipidemia Sister   . Hypertension Sister   . Vision loss Sister    Allergies  Allergen Reactions  . Sulfonamide Derivatives     REACTION: rash   Current Outpatient Prescriptions on File Prior to Visit  Medication Sig Dispense Refill  .  Ascorbic Acid (VITAMIN C) 500 MG tablet Take 500 mg by mouth 2 (two) times daily.        Marland Kitchen aspirin 81 MG EC tablet Take 81 mg by mouth daily.        . Cholecalciferol (VITAMIN D) 1000 UNITS capsule Take 1,000 Units by mouth 2 (two) times daily.        . ergocalciferol (VITAMIN D2) 50000 UNITS capsule Take 50,000 Units by mouth once a week.        . hydrochlorothiazide 25 MG tablet Take 25 mg by mouth daily. At noon       . hydrochlorothiazide 25 MG tablet 1 by mouth once daily  90 tablet  3  . imipramine (TOFRANIL) 25 MG tablet 1 by mouth every evening  90 tablet  3  . levothyroxine (SYNTHROID, LEVOTHROID) 125 MCG tablet Take one by mouth daily  90 tablet  3  . metoprolol (TOPROL XL) 100 MG 24 hr tablet Take one by mouth twice a day  90 tablet  3  . pantoprazole (PROTONIX) 40 MG tablet Take 40 mg by mouth 2 (two) times  daily. One tablet by mouth 45 minutes before breakfast and supper       . rosuvastatin (CRESTOR) 20 MG tablet 1 tablet by mouth daily  90 tablet  3  . telmisartan-hydrochlorothiazide (MICARDIS HCT) 80-25 MG per tablet 1 daily in am  90 tablet  3  . venlafaxine (EFFEXOR XR) 75 MG 24 hr capsule Take one by mouth three times a day  90 capsule  3  . vitamin B-12 (CYANOCOBALAMIN) 1000 MCG tablet Take 1,000 mcg by mouth 2 (two) times daily.        . vitamin E (VITAMIN E) 400 UNIT capsule Take 400 Units by mouth 2 (two) times daily.         The PMH, PSH, Social History, Family History, Medications, and allergies have been reviewed in [facility], and have been updated if relevant.  Review of Systems Pertinent items are noted in HPI.   Objective:  BP 140/90  Pulse 61  Temp(Src) 97.9 F (36.6 C) (Oral)  Wt 181 lb (82.101 kg)   Gen:  Alert, pleasant, NAD. HEENT:  +PND, swollen turbinates, sinuses TTP throughout Resp:  CTA bilaterall CVS:  RRR Psych:  Anxious but pleasant  Assessment:   Acute Sinusitis.   Plan:  Amoxicillin, supportive care as per pt instructions.

## 2010-09-09 ENCOUNTER — Telehealth: Payer: Self-pay | Admitting: Family Medicine

## 2010-09-09 NOTE — Telephone Encounter (Signed)
Rec'd Physician Results Form from pt to be filled out. Form in Dr. Melanie Crazier inbox..Carolin Coy

## 2010-09-14 NOTE — Telephone Encounter (Signed)
Please fill out form and I will sign.

## 2010-09-14 NOTE — Telephone Encounter (Signed)
Form is in your in box to be signed.

## 2010-09-15 NOTE — Telephone Encounter (Signed)
Patient notified.  Form faxed.

## 2010-10-14 ENCOUNTER — Other Ambulatory Visit: Payer: BC Managed Care – PPO

## 2010-10-31 ENCOUNTER — Ambulatory Visit (INDEPENDENT_AMBULATORY_CARE_PROVIDER_SITE_OTHER)
Admission: RE | Admit: 2010-10-31 | Discharge: 2010-10-31 | Disposition: A | Discharge status: Home / Self Care | Payer: BC Managed Care – PPO | Source: Ambulatory Visit | Attending: Family Medicine | Admitting: Family Medicine

## 2010-10-31 ENCOUNTER — Ambulatory Visit (INDEPENDENT_AMBULATORY_CARE_PROVIDER_SITE_OTHER): Payer: BC Managed Care – PPO | Admitting: Family Medicine

## 2010-10-31 ENCOUNTER — Encounter: Payer: Self-pay | Admitting: Family Medicine

## 2010-10-31 VITALS — BP 120/74 | HR 71 | Temp 97.9°F | Wt 181.1 lb

## 2010-10-31 DIAGNOSIS — M25551 Pain in right hip: Secondary | ICD-10-CM

## 2010-10-31 DIAGNOSIS — M25559 Pain in unspecified hip: Secondary | ICD-10-CM

## 2010-10-31 DIAGNOSIS — J069 Acute upper respiratory infection, unspecified: Secondary | ICD-10-CM

## 2010-10-31 MED ORDER — TIZANIDINE HCL 4 MG PO TABS: 4.0000 mg | ORAL_TABLET | Freq: Every evening | ORAL | Status: AC

## 2010-10-31 MED ORDER — DICLOFENAC SODIUM 75 MG PO TBEC: 75.0000 mg | DELAYED_RELEASE_TABLET | Freq: Two times a day (BID) | ORAL | Status: DC

## 2010-10-31 NOTE — Progress Notes (Signed)
Subjective:    Patient ID: Elizabeth Rivera, female    DOB: Oct 22, 1950, 60 y.o.   MRN: [id number]  HPI  Elizabeth Rivera, a 60 y.o. female presents today in the office for the following:    Had a cold last week. Never could get warm. Was constipated.   On sat afteroon. Sunday morning, started to get some to the bathroom. Felt cold, then was straining really hard to go to the bathroom. Went to the kitchen, and seemed to have blacked out.   Fell on her wrist and also fell on her right hip. Hurts to sit and lay down. Hurts down in her bottom.   The patient had a URI, runny nose, congestion. She additionally was constipated. She tried to bear down and go to the bathroom for about 10 minutes, and then when she stood up and started walking she got lightheaded and fell. Seems like she actually blacked out, does not fully remember the incident. When she awoke, she had some right-sided wrist and shoulder pain as well as buttocks pain.   The PMH, PSH, Social History, Family History, Medications, and allergies have been reviewed in [facility], and have been updated if relevant.   Review of Systems REVIEW OF SYSTEMS  GEN: No fevers, chills. Nontoxic. Primarily MSK c/o today. MSK: Detailed in the HPI GI: tolerating PO intake without difficulty Neuro: No numbness, parasthesias, or tingling associated. Otherwise the pertinent positives of the ROS are noted above.      Objective:   Physical Exam   Physical Exam  Blood pressure 120/74, pulse 71, temperature 97.9 F (36.6 C), temperature source Oral, weight 181 lb 1.9 oz (82.155 kg), SpO2 98.00%.  GEN: Well-developed,well-nourished,in no acute distress; alert,appropriate and cooperative throughout examination HEENT: Normocephalic and atraumatic without obvious abnormalities. Ears, externally no deformities PULM: Breathing comfortably in no respiratory distress EXT: No clubbing, cyanosis, or edema PSYCH: Normally interactive. Cooperative during the  interview. Pleasant. Friendly and conversant. Not anxious or depressed appearing. Normal, full affect.  Shoulder: Inspection: No muscle wasting or winging Ecchymosis/edema: neg  AC joint, scapula, clavicle: NT Cervical spine: NT, full ROM Spurling's: neg Abduction: full, 5/5 Flexion: full, 5/5 IR, full, lift-off: 5/5 ER at neutral: full, 5/5 AC crossover and compression: neg Neer: neg Hawkins: neg Drop Test: neg Empty Can: neg Supraspinatus insertion: NT Bicipital groove: NT Speed's: neg Yergason's: neg Sulcus sign: neg Scapular dyskinesis: none C5-T1 intact Sensation intact Grip 5/5  Wrist: Nontender throughout and good range of motion. Nontender throughout bony anatomy.   Physical Exam  Blood pressure 120/74, pulse 71, temperature 97.9 F (36.6 C), temperature source Oral, weight 181 lb 1.9 oz (82.155 kg), SpO2 98.00%.  GEN: Well-developed,well-nourished,in no acute distress; alert,appropriate and cooperative throughout examination HEENT: Normocephalic and atraumatic without obvious abnormalities. Ears, externally no deformities PULM: Breathing comfortably in no respiratory distress EXT: No clubbing, cyanosis, or edema PSYCH: Normally interactive. Cooperative during the interview. Pleasant. Friendly and conversant. Not anxious or depressed appearing. Normal, full affect.  HIP EXAM: SIDE:  R ROM: Abduction, Flexion, Internal and External range of motion: good Pain with terminal IROM and EROM: no Pain in right buttocks region. GTB: NT SLR: NEG Knees: No effusion       Assessment & Plan:   1. Right hip pain  DG Hip Complete Right, diclofenac (VOLTAREN) 75 MG EC tablet, tiZANidine (ZANAFLEX) 4 MG tablet  2. URI (upper respiratory infection)     Xray, hip series Indication: pain Findings: No occult fracture or dislocation.  No signficant osteoarthritic changes.  Reassure the patient. Suspect 2-3 weeks will be necessary for resolution.

## 2010-11-01 ENCOUNTER — Telehealth: Payer: Self-pay | Admitting: *Deleted

## 2010-11-01 NOTE — Telephone Encounter (Signed)
Stop the muscle relaxer for now. NSAIDS would not do that.  Sometimes muscle relaxer can give people an intoxicated sensation -- I would just stop it. Not a true allergy, but probably too high of a dose for her.

## 2010-11-01 NOTE — Telephone Encounter (Signed)
Patient advised.

## 2010-11-01 NOTE — Telephone Encounter (Signed)
Pt was seen yesterday after falling.  States she was given anti inflammatory and muscle relaxer.  She had hallucinations last night- saw "ugly people" beside her bed.  States the medicine helped but now she is afraid to take it. Please advise.

## 2010-12-21 ENCOUNTER — Encounter: Payer: Self-pay | Admitting: Family Medicine

## 2010-12-21 ENCOUNTER — Ambulatory Visit (INDEPENDENT_AMBULATORY_CARE_PROVIDER_SITE_OTHER): Payer: BC Managed Care – PPO | Admitting: Family Medicine

## 2010-12-21 VITALS — BP 130/90 | HR 60 | Temp 97.8°F | Wt 183.5 lb

## 2010-12-21 DIAGNOSIS — R300 Dysuria: Secondary | ICD-10-CM

## 2010-12-21 DIAGNOSIS — I10 Essential (primary) hypertension: Secondary | ICD-10-CM

## 2010-12-21 DIAGNOSIS — R61 Generalized hyperhidrosis: Secondary | ICD-10-CM

## 2010-12-21 DIAGNOSIS — F411 Generalized anxiety disorder: Secondary | ICD-10-CM

## 2010-12-21 LAB — POCT URINALYSIS DIPSTICK
Bilirubin, UA: NEGATIVE
Glucose, UA: NEGATIVE
Ketones, UA: NEGATIVE
Nitrite, UA: NEGATIVE
pH, UA: 5

## 2010-12-21 MED ORDER — ALPRAZOLAM 0.25 MG PO TABS: 0.2500 mg | ORAL_TABLET | Freq: Three times a day (TID) | ORAL | Status: DC | PRN

## 2010-12-21 NOTE — Assessment & Plan Note (Addendum)
With the h/o adrenal mass serrendipitously found via CT by Urology when doing workup without size parameters or other characterization but urology's workup was negative and cortisol here was nml, still could be something to do with that. The most likely thing from her sxs would be phaechromocytoma, tumor secreting epinephrine (which Dr Jacqlyn Larsen specifically said he checked via lab and was negative) or cortisol secreting tumor (which has been checked multiple times). Could also be anxiety kicking up as she admits she is under a lot of stress right now and she has not had Xanax for some time as she ran out of it and did not get it refilled. Will check for VMA/metanephr and will start 0.25 Xanax as needed. UTI was ruled out via U/A and Micro. Will see her back in two weeks.

## 2010-12-21 NOTE — Progress Notes (Signed)
  Subjective:    Patient ID: Elizabeth Rivera, female    DOB: [birth year], 60 y.o.   MRN: [id number]  HPI Pt here as acute appt for having had a sweaty episode this AM at work. She had water pouring off of her today which she has had in the past. She was also very tired and got ready for work. Then she had more sweats and water pouring off of her and it lasted over an hour. Drinking fluids did not help. Her husband was called and things improved. She has been working a lot and has been stressed. She has been very nervous and told her manager she needed to go home but he would not release her due to her appearance. She has been very nervous. She was shaking throughout this. She was also crying.  She feels washed out and looks tired. Her BP was never taken. She has had a mass on the adrenal via CT with nml Cortisol in May. She has some pressure in the vaginal/suprapubic area. She has had bladder revision in the past, years ago.    Review of SystemsNoncontributory except as above.       Objective:   Physical Exam  Constitutional: She is oriented to person, place, and time. She appears well-developed and well-nourished. No distress.       Looks tired.  HENT:  Head: Normocephalic and atraumatic.  Right Ear: External ear normal.  Left Ear: External ear normal.  Nose: Nose normal.  Mouth/Throat: Oropharynx is clear and moist. No oropharyngeal exudate.  Eyes: Conjunctivae and EOM are normal. Pupils are equal, round, and reactive to light.  Neck: Normal range of motion. Neck supple. No thyromegaly present.  Cardiovascular: Normal rate, regular rhythm and normal heart sounds.   Pulmonary/Chest: Effort normal and breath sounds normal. She has no wheezes. She has no rales.  Abdominal: Soft. Bowel sounds are normal. She exhibits no distension and no mass. There is no tenderness. There is no rebound and no guarding.  Musculoskeletal: Normal range of motion. She exhibits no edema and no tenderness.    Lymphadenopathy:    She has no cervical adenopathy.  Neurological: She is alert and oriented to person, place, and time. She has normal reflexes. She exhibits normal muscle tone. Coordination normal.  Skin: Skin is warm and dry. She is not diaphoretic. No erythema.  Psychiatric: She has a normal mood and affect. Her behavior is normal. Judgment and thought content normal.          Assessment & Plan:

## 2010-12-21 NOTE — Assessment & Plan Note (Signed)
Is on Effexor tid. She has no Xanax for a year or more.  Will try Xanax again today.

## 2010-12-21 NOTE — Patient Instructions (Signed)
Get bottle to collect 24 hr urine for eval of adrenal tumor. Take Xanax tonite when gets home. Being off work tomorrow is probably a good idea. See me back in two weeks for recheck. 35 mins spent with pt.

## 2010-12-21 NOTE — Assessment & Plan Note (Signed)
Slightly elevated today. Will check for Phaeo. BP Readings from Last 3 Encounters:  12/21/10 130/90  10/31/10 120/74  09/06/10 140/90

## 2010-12-23 LAB — URINE CULTURE: Colony Count: 9000

## 2010-12-29 LAB — VMA AND HVA, 24 HR UR
24 Hour urine volume (VMAHVA): 2000 mL/24 h
VANILLYLMANDELIC ACID, U: 2.8 mg/24 h (ref <=6.0)

## 2011-01-04 ENCOUNTER — Ambulatory Visit (INDEPENDENT_AMBULATORY_CARE_PROVIDER_SITE_OTHER): Payer: BC Managed Care – PPO | Admitting: Family Medicine

## 2011-01-04 ENCOUNTER — Encounter: Payer: Self-pay | Admitting: Family Medicine

## 2011-01-04 DIAGNOSIS — F411 Generalized anxiety disorder: Secondary | ICD-10-CM

## 2011-01-04 DIAGNOSIS — I10 Essential (primary) hypertension: Secondary | ICD-10-CM

## 2011-01-04 MED ORDER — PANTOPRAZOLE SODIUM 40 MG PO TBEC: 40.0000 mg | DELAYED_RELEASE_TABLET | Freq: Two times a day (BID) | ORAL | Status: DC

## 2011-01-04 MED ORDER — VENLAFAXINE HCL ER 150 MG PO CP24: ORAL_CAPSULE | ORAL | Status: DC

## 2011-01-04 MED ORDER — ALPRAZOLAM 0.25 MG PO TABS: 0.2500 mg | ORAL_TABLET | Freq: Three times a day (TID) | ORAL | Status: DC | PRN

## 2011-01-04 NOTE — Progress Notes (Signed)
  Subjective:    Patient ID: Elizabeth Rivera, female    DOB: 1950-10-06, 60 y.o.   MRN: [id number]  HPI Pt here for 2 week follow up when seen for anxiety and acute sweating episode at work. She was under extreme stress when seen and was treated for that with Phaeo urine collection that was nml. The longer I talk with her the more convinced I am of her having anxiety attack recurrence. She has been on Effexor for many years and has done well. She has not needed Xanax at all until recently. She describes the feeling she has during these events as similar but much less intense than she had with a nervous breakdown she had years ago and needed hospitalization with medication reorganization. This is when she began the Effexor.  She otherwise has been doing well.     Review of Systems Urine collection for VMA and Metanephrines was nml.     Objective:   Physical Exam  Constitutional: She appears well-developed and well-nourished. No distress.  HENT:  Head: Normocephalic and atraumatic.  Right Ear: External ear normal.  Left Ear: External ear normal.  Nose: Nose normal.  Mouth/Throat: Oropharynx is clear and moist. No oropharyngeal exudate.  Eyes: Conjunctivae and EOM are normal. Pupils are equal, round, and reactive to light.  Neck: Normal range of motion. Neck supple. No thyromegaly present.  Cardiovascular: Normal rate, regular rhythm and normal heart sounds.   Pulmonary/Chest: Effort normal and breath sounds normal. She has no wheezes. She has no rales.  Lymphadenopathy:    She has no cervical adenopathy.  Skin: She is not diaphoretic.  Psychiatric: She has a normal mood and affect. Her behavior is normal. Judgment and thought content normal.       Much more calm and less agitated than last visit.          Assessment & Plan:

## 2011-01-04 NOTE — Assessment & Plan Note (Signed)
High today but still  Anxious. Will have it rechecked next time and treat if still high. BP Readings from Last 3 Encounters:  01/04/11 150/90  12/21/10 130/90  10/31/10 120/74

## 2011-01-04 NOTE — Patient Instructions (Addendum)
RTC 3 months with Dr Damita Dunnings for recheck.  Recheck anxiety levels and panic as well as BP recheck.

## 2011-01-04 NOTE — Assessment & Plan Note (Signed)
Am now convinced she has been having panic attacks and do not think adrenal disease is involved.  Will increase her Effexor to total of 300mg  a day. The goal is no episodes. Cont to use Xanax as needed.  If has another episode at work, take Xanax. 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 Will recheck next time and treat if needed.

## 2011-01-23 ENCOUNTER — Telehealth: Payer: Self-pay | Admitting: *Deleted

## 2011-01-23 NOTE — Telephone Encounter (Signed)
Form from [employer] placed in your in box.

## 2011-01-25 NOTE — Telephone Encounter (Signed)
Form completed. Please send/fax.

## 2011-01-25 NOTE — Telephone Encounter (Signed)
Completed form faxed as instructed.

## 2011-01-27 ENCOUNTER — Telehealth: Payer: Self-pay | Admitting: *Deleted

## 2011-01-27 MED ORDER — OMEPRAZOLE 40 MG PO CPDR: 40.0000 mg | DELAYED_RELEASE_CAPSULE | Freq: Every day | ORAL | Status: DC

## 2011-01-27 MED ORDER — VENLAFAXINE HCL ER 150 MG PO CP24: ORAL_CAPSULE | ORAL | Status: DC

## 2011-01-27 MED ORDER — VENLAFAXINE HCL ER 75 MG PO CP24: 75.0000 mg | ORAL_CAPSULE | Freq: Every day | ORAL | Status: DC

## 2011-01-27 MED ORDER — PANTOPRAZOLE SODIUM 40 MG PO TBEC: 40.0000 mg | DELAYED_RELEASE_TABLET | Freq: Two times a day (BID) | ORAL | Status: DC

## 2011-01-27 NOTE — Telephone Encounter (Signed)
Patient calling and says that this medication needs to be prior-authorization in her protonix and has not heard anything by from [place]. The medications are effexor and protonix. Patient is now out and wants to know what to do. Please advise

## 2011-01-27 NOTE — Telephone Encounter (Signed)
If I understand clearly, protonix and effexor need PA's.  Looks like some form was faxed 01/25/2011 but not scanned. This may have been PA forms. I will send in omeprazole and effexor XR 225mg  which is max dose (so have pt take 150mg  in am and 75mg  in pm) while get PA form for PCP to fill out Please call and notify patient.  Please obtain PA forms for [place] to fill out. Will route to PCP.

## 2011-01-27 NOTE — Telephone Encounter (Signed)
Called home, pt at work.  Called at work [phone] ([phone]) Discussed below. Will send in 1 mo supply to [facility] of omeprazole, protonix, and effexor for total dose of 225mg 

## 2011-02-02 ENCOUNTER — Telehealth: Payer: Self-pay | Admitting: *Deleted

## 2011-02-02 NOTE — Telephone Encounter (Signed)
Completed form faxed back to [employer] as instructed.

## 2011-02-02 NOTE — Telephone Encounter (Signed)
Received faxed form from [place] requesting that it be completed and faxed back for prior authorization on Pantoprazole 40 mg. Form is in your in box.

## 2011-02-02 NOTE — Telephone Encounter (Signed)
Sheet filled out.

## 2011-02-08 ENCOUNTER — Telehealth: Payer: Self-pay | Admitting: *Deleted

## 2011-02-08 MED ORDER — VENLAFAXINE HCL ER 150 MG PO CP24: ORAL_CAPSULE | ORAL | Status: DC

## 2011-02-08 NOTE — Telephone Encounter (Signed)
Elizabeth Rivera at [facility] notified as instructed by telephone.  Completed form sent back to [employer] which is [employer].

## 2011-02-08 NOTE — Telephone Encounter (Signed)
I called pt.  She didn't have good control of sx with 225mg  a day.  She did well with 300mg  a day of effexor.  She currently has 225mg  a day.  If pharmacy needs new rx for 150mg  po bid, #[id number], 3rf, then please send in.  Form is done.

## 2011-02-08 NOTE — Telephone Encounter (Signed)
Received a phone call from pharmacy to verify prescription for the patient.  Was informed that patient received a prescription at a local pharmacy on 01/04/11 for Effexor ER 75 mg.  [facility] received a script for patient on 01/27/11 for Effexor XR 150 mg take two times a day.  Please see office visit notes dated 01/04/11 which shows prescription was changed at that appointment. Please confirm correct dose. Copy of prior auth form is in your in box for review.

## 2011-03-31 ENCOUNTER — Encounter: Payer: Self-pay | Admitting: Family Medicine

## 2011-03-31 ENCOUNTER — Ambulatory Visit (INDEPENDENT_AMBULATORY_CARE_PROVIDER_SITE_OTHER): Payer: BC Managed Care – PPO | Admitting: Family Medicine

## 2011-03-31 VITALS — BP 158/100 | HR 56 | Temp 98.0°F | Wt 183.0 lb

## 2011-03-31 DIAGNOSIS — M79609 Pain in unspecified limb: Secondary | ICD-10-CM

## 2011-03-31 DIAGNOSIS — M25551 Pain in right hip: Secondary | ICD-10-CM

## 2011-03-31 DIAGNOSIS — M25559 Pain in unspecified hip: Secondary | ICD-10-CM

## 2011-03-31 DIAGNOSIS — E039 Hypothyroidism, unspecified: Secondary | ICD-10-CM

## 2011-03-31 DIAGNOSIS — F411 Generalized anxiety disorder: Secondary | ICD-10-CM

## 2011-03-31 DIAGNOSIS — I10 Essential (primary) hypertension: Secondary | ICD-10-CM

## 2011-03-31 DIAGNOSIS — E7800 Pure hypercholesterolemia, unspecified: Secondary | ICD-10-CM

## 2011-03-31 DIAGNOSIS — IMO0001 Reserved for inherently not codable concepts without codable children: Secondary | ICD-10-CM

## 2011-03-31 DIAGNOSIS — M791 Myalgia, unspecified site: Secondary | ICD-10-CM

## 2011-03-31 DIAGNOSIS — G4700 Insomnia, unspecified: Secondary | ICD-10-CM

## 2011-03-31 DIAGNOSIS — E559 Vitamin D deficiency, unspecified: Secondary | ICD-10-CM

## 2011-03-31 LAB — LIPID PANEL
HDL: 59.3 mg/dL (ref >39.00)
Total CHOL/HDL Ratio: 5
VLDL: 36 mg/dL (ref 0.0–40.0)

## 2011-03-31 LAB — HEPATIC FUNCTION PANEL
Albumin: 4.4 g/dL (ref 3.5–5.2)
Bilirubin, Direct: 0 mg/dL (ref 0.0–0.3)
Total Protein: 7.8 g/dL (ref 6.0–8.3)

## 2011-03-31 LAB — CK: Total CK: 190 U/L — ABNORMAL HIGH (ref 7–177)

## 2011-03-31 LAB — LDL CHOLESTEROL, DIRECT: Direct LDL: 222.7 mg/dL

## 2011-03-31 MED ORDER — DICLOFENAC SODIUM 75 MG PO TBEC: 75.0000 mg | DELAYED_RELEASE_TABLET | Freq: Two times a day (BID) | ORAL | Status: DC

## 2011-03-31 MED ORDER — IMIPRAMINE HCL 25 MG PO TABS: ORAL_TABLET | ORAL | Status: DC

## 2011-03-31 MED ORDER — ALPRAZOLAM 0.25 MG PO TABS: 0.2500 mg | ORAL_TABLET | Freq: Three times a day (TID) | ORAL | Status: DC | PRN

## 2011-03-31 NOTE — Patient Instructions (Addendum)
You can get your results through our phone system.  Follow the instructions on the blue card. Stop the crestor for now and call me with an update in about 1-2 week.  See if that helps the leg pain.

## 2011-03-31 NOTE — Progress Notes (Signed)
Leg pain.  Off voltaren and this has likely made pain worse.  H/o B12 def and leg edema.  On her feet constantly at work.  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  Pain was worse after inc in walking on vacation in 1/13.  She'll not the superficial veins are "puffy" at night.   Insomnia.  Prev better with imipramine.  Off med and sleep got worse.  She'd like to get back on it.  No ADE from medicine.    Anxiety.  Prn xanax use.  No ADE.  It does help.  Taking it 1-2x per day, depending on the day and situation.  On effexor 300mg  total a day.  That has helped also.    PMH and SH reviewed  ROS: See HPI, otherwise noncontributory.  Meds, vitals, and allergies reviewed.   Nad ncat Mmm rrr ctab abd soft Ext w/o edema Diffusely sore on the arms, forearms, thighs and shins w/o bruising or erythema

## 2011-04-01 LAB — VITAMIN D 25 HYDROXY (VIT D DEFICIENCY, FRACTURES): Vit D, 25-Hydroxy: 30 ng/mL (ref 30–89)

## 2011-04-02 ENCOUNTER — Encounter: Payer: Self-pay | Admitting: Family Medicine

## 2011-04-02 NOTE — Assessment & Plan Note (Signed)
Restart TCA and f/u prn.  She agrees.

## 2011-04-02 NOTE — Assessment & Plan Note (Signed)
Controlled, continue current meds.   

## 2011-04-03 NOTE — Assessment & Plan Note (Signed)
Possibly statin related, hold this and call back with update; check CK.

## 2011-04-03 NOTE — Assessment & Plan Note (Signed)
See notes on labs.  TSH elevated.

## 2011-04-07 ENCOUNTER — Encounter: Payer: Self-pay | Admitting: Family Medicine

## 2011-05-08 ENCOUNTER — Encounter: Payer: Self-pay | Admitting: Family Medicine

## 2011-05-08 ENCOUNTER — Ambulatory Visit (INDEPENDENT_AMBULATORY_CARE_PROVIDER_SITE_OTHER): Payer: BC Managed Care – PPO | Admitting: Family Medicine

## 2011-05-08 VITALS — BP 160/104 | HR 72 | Temp 98.0°F | Wt 179.0 lb

## 2011-05-08 DIAGNOSIS — J069 Acute upper respiratory infection, unspecified: Secondary | ICD-10-CM

## 2011-05-08 MED ORDER — BENZONATATE 100 MG PO CAPS: 100.0000 mg | ORAL_CAPSULE | Freq: Two times a day (BID) | ORAL | Status: AC | PRN

## 2011-05-08 NOTE — Progress Notes (Signed)
SUBJECTIVE:  Elizabeth Rivera is a 61 y.o. female who complains of coryza, congestion, sneezing and sore throat for 3 days. She denies a history of anorexia, chest pain, chills and dizziness and denies a history of asthma. Patient denies smoke cigarettes.   Patient Active Problem List  Diagnoses  . HYPOTHYROIDISM  . ADRENAL MASS VIA CT  . UNSPECIFIED VITAMIN D DEFICIENCY  . HYPERLIPIDEMIA, MIXED  . OBESITY  . ANXIETY  . NEUROPATHY  . BENIGN POSITIONAL VERTIGO  . HYPERTENSION  . EROSIVE ESOPHAGITIS  . GERD  . EROSIVE GASTRITIS  . DIVERTICULOSIS, COLON  . IBS  . ROSACEA  . Extremity pain  . INSOMNIA  . Other chest pain  . ABDOMINAL PAIN, LEFT UPPER QUADRANT  . HYPERGLYCEMIA  . Leg edema  . B12 deficiency  . Diaphoresis   Past Medical History  Diagnosis Date  . Thyroid disease     hypothyroid  . Anxiety   . Hyperlipidemia   . Hypertension   . Other chest pain     chest discomfort  . Obesity   . Neuropathy   . Other esophagitis     erosive esophagitis  . Other specified gastritis without mention of hemorrhage     erosvie gastritis  . Pain in limb     Right leg pain  . Other specified disorders of adrenal glands 07/2007    adrenal mass via of CT 1.4cm left Adrenal no change(Dr.Cope)   . Other abnormal blood chemistry   . Diverticulosis of colon (without mention of hemorrhage) 05/20/2009    Internal hemms (Dr. Vira Agar)  . Irritable bowel syndrome     history of  . Insomnia, unspecified   . Nervous breakdown 08/2002    [facility]  . GERD (gastroesophageal reflux disease)     reflux HH 09/01/2002//egd reflux Esoph. HH Gastritis nonbleeding erosive gastropathy Duodentitis 08/15/2006  . Hyperglycemia   . Candidal vaginitis    Past Surgical History  Procedure Date  . Cholecystectomy 03/2004  . Abdominal hysterectomy 1995    Part Hyst BSO DUB ovarian cyst B9  . Vaginal delivery 1977  . Breast reduction surgery 1984  . Cystoscopy 01/08/2008    Former site of  inflammation healed (Dr. Jacqlyn Larsen)  . Nsvd x1 1977  . Esophagogastroduodenoscopy 1. 09/01/02  2. 08/15/06    1. Reflux, HH  2. Reflux esoph HH gaastritis nonbleeding erosive gastropathy duodenitis  . Colonoscopy 1. 08/15/06  2. 05/20/09    Int Hemms  2. Divertics Int Hemms (Dr. Vira Agar)  . Ct abd w & pelvis wo cm 6/09    CT Scan abd stable adrenal adenoma 1.4cm left adrenal no change (Dr. Jacqlyn Larsen)   History  Substance Use Topics  . Smoking status: Never Smoker   . Smokeless tobacco: Never Used  . Alcohol Use: No   Family History  Problem Relation Age of Onset  . Hypertension Mother   . Hyperlipidemia Mother   . Stroke Mother     mini strokes  . Vision loss Father     vision problems  . Cancer Father     Colon   . Hyperlipidemia Sister   . Hypertension Sister   . Vision loss Sister    Allergies  Allergen Reactions  . Sulfonamide Derivatives     REACTION: rash   Current Outpatient Prescriptions on File Prior to Visit  Medication Sig Dispense Refill  . ALPRAZolam (XANAX) 0.25 MG tablet Take 1 tablet (0.25 mg total) by mouth 3 (three) times daily  as needed for anxiety.  50 tablet  2  . aspirin 81 MG EC tablet Take 81 mg by mouth daily.        . Cholecalciferol (VITAMIN D) 1000 UNITS capsule Take 1,000 Units by mouth 2 (two) times daily.        . diclofenac (VOLTAREN) 75 MG EC tablet Take 1 tablet (75 mg total) by mouth 2 (two) times daily.  180 tablet  3  . hydrochlorothiazide 25 MG tablet Take 25 mg by mouth daily. At noon       . imipramine (TOFRANIL) 25 MG tablet 1 by mouth every evening  90 tablet  3  . levothyroxine (SYNTHROID, LEVOTHROID) 125 MCG tablet Take one by mouth daily except one and one half (1-1/2) tablets on Sunday.      . metoprolol (TOPROL XL) 100 MG 24 hr tablet Take one by mouth twice a day  90 tablet  3  . pantoprazole (PROTONIX) 40 MG tablet Take 1 tablet (40 mg total) by mouth 2 (two) times daily. One tablet by mouth 45 minutes before breakfast and supper  30 tablet   3  . telmisartan-hydrochlorothiazide (MICARDIS HCT) 80-25 MG per tablet 1 daily in am  90 tablet  3  . venlafaxine (EFFEXOR XR) 150 MG 24 hr capsule One tablet by mouth twice a day  180 capsule  3  . vitamin B-12 (CYANOCOBALAMIN) 1000 MCG tablet Take 1,000 mcg by mouth 2 (two) times daily.        . vitamin E (VITAMIN E) 400 UNIT capsule Take 400 Units by mouth 2 (two) times daily.         The PMH, PSH, Social History, Family History, Medications, and allergies have been reviewed in [facility], and have been updated if relevant.  OBJECTIVE: BP 160/104  Pulse 72  Temp(Src) 98 F (36.7 C) (Oral)  Wt 179 lb (81.194 kg)    She appears well, vital signs are as noted. Ears normal.  Throat and pharynx normal.  Neck supple. No adenopathy in the neck. Nose is congested. Sinuses non tender. The chest is clear, without wheezes or rales.  ASSESSMENT:  viral upper respiratory illness  PLAN: Symptomatic therapy suggested: push fluids, rest and return office visit prn if symptoms persist or worsen. Lack of antibiotic effectiveness discussed with her. Call or return to clinic prn if these symptoms worsen or fail to improve as anticipated.

## 2011-05-08 NOTE — Patient Instructions (Addendum)
This is likely a virus.  Drink lots of fluids.  Treat sympotmatically with Mucinex, nasal saline irrigation, and Tylenol/Ibuprofen.You can use warm compresses.  Cough suppressant as needed.  Call if not improving as expected in 5-7 days.  Make sure you follow up with Dr. Damita Dunnings when you feel better (about your blood pressure).

## 2011-05-18 ENCOUNTER — Other Ambulatory Visit: Payer: BC Managed Care – PPO

## 2011-05-18 ENCOUNTER — Other Ambulatory Visit (INDEPENDENT_AMBULATORY_CARE_PROVIDER_SITE_OTHER): Payer: BC Managed Care – PPO

## 2011-05-18 DIAGNOSIS — E039 Hypothyroidism, unspecified: Secondary | ICD-10-CM

## 2011-05-22 ENCOUNTER — Other Ambulatory Visit: Payer: Self-pay | Admitting: Family Medicine

## 2011-05-22 DIAGNOSIS — E039 Hypothyroidism, unspecified: Secondary | ICD-10-CM

## 2011-05-23 ENCOUNTER — Other Ambulatory Visit: Payer: Self-pay | Admitting: *Deleted

## 2011-05-23 MED ORDER — LEVOTHYROXINE SODIUM 125 MCG PO TABS: ORAL_TABLET | ORAL | Status: DC

## 2011-06-19 ENCOUNTER — Other Ambulatory Visit: Payer: Self-pay | Admitting: Family Medicine

## 2011-06-30 ENCOUNTER — Other Ambulatory Visit: Payer: Self-pay | Admitting: Family Medicine

## 2011-06-30 NOTE — Telephone Encounter (Signed)
Prev sent 03/31/11.  Please clarify with pharmacy.  Thanks.

## 2011-06-30 NOTE — Telephone Encounter (Signed)
It had 3 refills.  Pharmacy should have that.  If not please send in 90 with 3 rf.  Thanks.

## 2011-06-30 NOTE — Telephone Encounter (Signed)
She did get the 03/31/11 refill but she takes 1 per day so she would be due a refill now.  Is this okay?

## 2011-06-30 NOTE — Telephone Encounter (Signed)
Electronic refill request

## 2011-07-24 ENCOUNTER — Other Ambulatory Visit: Payer: Self-pay | Admitting: *Deleted

## 2011-07-24 MED ORDER — METOPROLOL SUCCINATE ER 100 MG PO TB24: ORAL_TABLET | ORAL | Status: DC

## 2011-07-25 ENCOUNTER — Other Ambulatory Visit: Payer: Self-pay

## 2011-07-25 MED ORDER — METOPROLOL SUCCINATE ER 100 MG PO TB24: ORAL_TABLET | ORAL | Status: DC

## 2011-07-25 NOTE — Telephone Encounter (Signed)
Cindy with [facility] received # 90 x 2 refills for metoprolol but request # 180  X 2. Lugene said OK to give # 180 and pt needs to call for appt.

## 2011-07-26 ENCOUNTER — Other Ambulatory Visit (INDEPENDENT_AMBULATORY_CARE_PROVIDER_SITE_OTHER): Payer: BC Managed Care – PPO

## 2011-07-26 DIAGNOSIS — E039 Hypothyroidism, unspecified: Secondary | ICD-10-CM

## 2011-07-27 ENCOUNTER — Encounter: Payer: Self-pay | Admitting: *Deleted

## 2011-09-21 DIAGNOSIS — N3941 Urge incontinence: Secondary | ICD-10-CM | POA: Insufficient documentation

## 2011-09-25 ENCOUNTER — Encounter: Payer: Self-pay | Admitting: Family Medicine

## 2011-09-25 DIAGNOSIS — R32 Unspecified urinary incontinence: Secondary | ICD-10-CM | POA: Insufficient documentation

## 2011-10-04 ENCOUNTER — Other Ambulatory Visit: Payer: Self-pay | Admitting: Family Medicine

## 2011-10-05 NOTE — Telephone Encounter (Signed)
Electronic refill request.  Please advise. 

## 2011-10-05 NOTE — Telephone Encounter (Signed)
Please call in

## 2011-10-06 NOTE — Telephone Encounter (Signed)
Medication phoned to pharmacy.  

## 2011-10-12 DIAGNOSIS — N952 Postmenopausal atrophic vaginitis: Secondary | ICD-10-CM | POA: Insufficient documentation

## 2011-12-19 ENCOUNTER — Emergency Department: Payer: Self-pay | Admitting: Emergency Medicine

## 2012-01-23 ENCOUNTER — Ambulatory Visit (INDEPENDENT_AMBULATORY_CARE_PROVIDER_SITE_OTHER): Payer: BC Managed Care – PPO | Admitting: Family Medicine

## 2012-01-23 ENCOUNTER — Encounter: Payer: Self-pay | Admitting: Family Medicine

## 2012-01-23 VITALS — BP 140/84 | HR 72 | Temp 98.4°F | Wt 176.8 lb

## 2012-01-23 DIAGNOSIS — E278 Other specified disorders of adrenal gland: Secondary | ICD-10-CM

## 2012-01-23 DIAGNOSIS — R1012 Left upper quadrant pain: Secondary | ICD-10-CM

## 2012-01-23 MED ORDER — SUCRALFATE 1 GM/10ML PO SUSP: 1.0000 g | Freq: Four times a day (QID) | ORAL | Status: DC

## 2012-01-23 NOTE — Progress Notes (Signed)
Recently with L forearm fracture casted per ortho.  She has taken vicodin occ for arm pain.  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  She has diffuse aches in her knees, hips and other large joints.   Longstanding history of LUQ pain and pain near the umbilicus.  She has had some intermittent constipation and occ hard stools.  This predates the arm pain.  On PPI and imipramine.  No blood in stool.  Having to strain for BM.  Colonoscopy 2011.  H/o IBS.  No vomiting.   Meds, vitals, and allergies reviewed.   ROS: See HPI.  Otherwise, noncontributory.  nad ncat Mmm rrr ctab abd soft, ttp but w/o rebound in the LUQ and near the umbilicus, no peritoneal signs.  Normal BS No masses felt on abd exam Ext w/o edema L forearm casted

## 2012-01-23 NOTE — Assessment & Plan Note (Signed)
Not an acute abd.  With h/o EE and GERD.  Would add on sucralfate qid.  She has hard stools with likely constipation.  Add on fiber daily and follow clinically.  She agrees. ddx d/w pt.

## 2012-01-23 NOTE — Patient Instructions (Addendum)
Take carafate 4 times a day and see if that helps the upper abdominal pain.  Take metamucil once a day for now and see if that helps with the hard stools.  Take care.

## 2012-03-08 ENCOUNTER — Other Ambulatory Visit: Payer: Self-pay | Admitting: Family Medicine

## 2012-03-08 NOTE — Telephone Encounter (Signed)
Electronic refill request.  Please advise. 

## 2012-03-08 NOTE — Telephone Encounter (Signed)
Patient was here for adrenal mass at last OV.  Does this affect her thyroid dose?  Please advise.

## 2012-03-09 NOTE — Telephone Encounter (Signed)
No, rx sent.  Thanks.

## 2012-03-09 NOTE — Telephone Encounter (Signed)
Please call in

## 2012-03-11 NOTE — Telephone Encounter (Signed)
Rx phoned to pharmacy.  

## 2012-04-02 ENCOUNTER — Other Ambulatory Visit: Payer: Self-pay | Admitting: *Deleted

## 2012-04-02 MED ORDER — VENLAFAXINE HCL ER 150 MG PO CP24: ORAL_CAPSULE | ORAL | Status: DC

## 2012-04-09 ENCOUNTER — Telehealth: Payer: Self-pay | Admitting: Family Medicine

## 2012-04-09 NOTE — Telephone Encounter (Signed)
Called for PA forms.  Rep Elberta Fortis) says that Micardis/HCT should go through without a PA.  Form for Venlafaxine is in your In Box.

## 2012-04-09 NOTE — Telephone Encounter (Signed)
Please get the PA forms sent to me.  I'll work on them.  Thanks.

## 2012-04-09 NOTE — Telephone Encounter (Signed)
Pt states she has been trying to get prior auths on Micardis and Effexor through [facility].  She has been dealing with this for 1-2 weeks.  Their phone number is [phone].

## 2012-04-10 NOTE — Telephone Encounter (Signed)
I'll address the hard copy.  

## 2012-04-11 ENCOUNTER — Telehealth: Payer: Self-pay | Admitting: *Deleted

## 2012-04-11 MED ORDER — IMIPRAMINE HCL 25 MG PO TABS: 25.0000 mg | ORAL_TABLET | Freq: Every day | ORAL | Status: DC

## 2012-04-11 NOTE — Telephone Encounter (Signed)
Faxed refill request.  Last filled 01/06/2012.  Please advise.

## 2012-04-11 NOTE — Telephone Encounter (Signed)
Sent!

## 2012-04-15 ENCOUNTER — Telehealth: Payer: Self-pay

## 2012-04-15 MED ORDER — VENLAFAXINE HCL ER 150 MG PO CP24: ORAL_CAPSULE | ORAL | Status: DC

## 2012-04-15 NOTE — Telephone Encounter (Signed)
Her dose had been upped to 300mg  a day.  This was a short term rx.  Pharmacy called.  I called and LMOVM for patient.  Please try to notify her about this.  I'll await the PA results.

## 2012-04-15 NOTE — Addendum Note (Signed)
Addended by: Helene Shoe on: 04/15/2012 12:26 PM   Modules accepted: Orders

## 2012-04-15 NOTE — Addendum Note (Signed)
Addended by: Tonia Ghent on: 04/15/2012 02:01 PM   Modules accepted: Orders

## 2012-04-15 NOTE — Telephone Encounter (Signed)
Sent. Thanks.   

## 2012-04-15 NOTE — Telephone Encounter (Signed)
Baxter Flattery with [facility]  [phone]; request call back re; refill venlafaxine; in past pt has taken once daily; today's instructions are one twice daily # 30. [facility] wants to know if instructions need to be changed or does quantity needs to be changed.Please advise.

## 2012-04-15 NOTE — Telephone Encounter (Signed)
Pt left v/m pt is out of effexor; pt already aware doing PA. Pt said she is very shaky and nervous and request small quantity sent to [facility] until [place] completed.Please advise.

## 2012-04-16 NOTE — Telephone Encounter (Signed)
PA faxed and given to Rena to await decision.

## 2012-04-19 ENCOUNTER — Telehealth: Payer: Self-pay

## 2012-04-19 NOTE — Telephone Encounter (Signed)
Pt left v/m for status of venlafaxine prior auth. Left v/m for pt to call back. ( Venlafaxine approved 04/16/12 thru 04/16/13 and [facility] notified.)

## 2012-04-19 NOTE — Telephone Encounter (Signed)
Pt notified Venlafaxine has been approved. Pt will ck with [facility] to make sure they will mail out 3 month rx.

## 2012-04-19 NOTE — Telephone Encounter (Signed)
Prior auth approved for venlafaxine;letter on Dr Josefine Class desk for signature and scanning.

## 2012-05-10 ENCOUNTER — Ambulatory Visit (INDEPENDENT_AMBULATORY_CARE_PROVIDER_SITE_OTHER): Payer: BC Managed Care – PPO | Admitting: Family Medicine

## 2012-05-10 ENCOUNTER — Encounter: Payer: Self-pay | Admitting: Family Medicine

## 2012-05-10 VITALS — BP 126/86 | HR 74 | Temp 98.2°F | Ht 63.0 in | Wt 183.0 lb

## 2012-05-10 DIAGNOSIS — E782 Mixed hyperlipidemia: Secondary | ICD-10-CM

## 2012-05-10 DIAGNOSIS — G589 Mononeuropathy, unspecified: Secondary | ICD-10-CM

## 2012-05-10 DIAGNOSIS — E039 Hypothyroidism, unspecified: Secondary | ICD-10-CM

## 2012-05-10 MED ORDER — HYDROCHLOROTHIAZIDE 25 MG PO TABS: 25.0000 mg | ORAL_TABLET | Freq: Every day | ORAL | Status: DC

## 2012-05-10 MED ORDER — ATORVASTATIN CALCIUM 20 MG PO TABS: 20.0000 mg | ORAL_TABLET | Freq: Every day | ORAL | Status: DC

## 2012-05-10 NOTE — Patient Instructions (Addendum)
I'd recommend starting cholesterol medicine as your numbers were too high given your family history. lipitor (atorvastatin) 20mg  sent into pharmacy. Follow up with Dr. Damita Dunnings in 1 month to see how you tolerate this medicine. If any more facial numbness, please return to see [place] immediately. Blood work today to check on neuropathy. We may consider trial of lyrica for neuropathy as you haven't tolerated gabapentin in past.

## 2012-05-10 NOTE — Progress Notes (Signed)
Subjective:    Patient ID: Elizabeth Rivera, female    DOB: May 04, 1950, 62 y.o.   MRN: [id number]  HPI CC: leg/arm numbness  H/o HTN, HLD, neuropathy in past, hypothyroidism.  Longstanding paresthesias bilateral hands, feet and legs (10+ yrs).  Known neuropathy.  Endorses tips of all 5 fingers bilaterally as well as upper arm, and lower anterior legs and feet.  Intermittent. Yesterday when awoke, felt right side of face numb from forehead to chin - first time that face was affected.  Denies weakness of face or body.  No headaches, dizziness, vision changes.  Increased job related stress recently.  Significant anxiety - ran out of effexor 3 weeks ago.  Was able to restart 2 wks ago.  Felt horrible for week she was off this.  Recent GI issues, saw Dr. Tiffany Kocher and started on 3 new medicines but unsure what they were.  Stopped protonix and carafate.  Mother with h/o multiple mini strokes.  Grandmother with h/o MI.  BP Readings from Last 3 Encounters:  05/10/12 126/86  01/23/12 140/84  05/08/11 160/104   Past Medical History  Diagnosis Date  . Thyroid disease     hypothyroid  . Anxiety   . Hyperlipidemia   . Hypertension   . Other chest pain     chest discomfort  . Obesity   . Neuropathy   . Other esophagitis     erosive esophagitis  . Other specified gastritis without mention of hemorrhage     erosvie gastritis  . Pain in limb     Right leg pain  . Other specified disorders of adrenal glands 07/2007    adrenal mass via of CT 1.4cm left Adrenal no change(Dr.Cope)   . Other abnormal blood chemistry   . Diverticulosis of colon (without mention of hemorrhage) 05/20/2009    Internal hemms (Dr. Vira Agar)  . Irritable bowel syndrome     history of  . Insomnia, unspecified   . Nervous breakdown 08/2002    [facility]  . GERD (gastroesophageal reflux disease)     reflux HH 09/01/2002//egd reflux Esoph. HH Gastritis nonbleeding erosive gastropathy Duodentitis 08/15/2006  .  Hyperglycemia   . Candidal vaginitis   . Forearm fracture     2013, left    Past Surgical History  Procedure Laterality Date  . Cholecystectomy  03/2004  . Abdominal hysterectomy  1995    Part Hyst BSO DUB ovarian cyst B9  . Vaginal delivery  1977  . Breast reduction surgery  1984  . Cystoscopy  01/08/2008    Former site of inflammation healed (Dr. Jacqlyn Larsen)  . Nsvd x1  1977  . Esophagogastroduodenoscopy  1. 09/01/02  2. 08/15/06    1. Reflux, HH  2. Reflux esoph HH gaastritis nonbleeding erosive gastropathy duodenitis  . Colonoscopy  1. 08/15/06  2. 05/20/09    Int Hemms  2. Divertics Int Hemms (Dr. Vira Agar)  . Ct abd w & pelvis wo cm  6/09    CT Scan abd stable adrenal adenoma 1.4cm left adrenal no change (Dr. Jacqlyn Larsen)   Family History  Problem Relation Age of Onset  . Hypertension Mother   . Hyperlipidemia Mother   . Stroke Mother     mini strokes  . Vision loss Father     vision problems  . Cancer Father     Colon   . Hyperlipidemia Sister   . Hypertension Sister   . Vision loss Sister    Review of Systems Per HPI  Objective:   Physical Exam  Nursing note and vitals reviewed. Constitutional: She is oriented to person, place, and time. She appears well-developed and well-nourished. No distress.  HENT:  Head: Normocephalic and atraumatic.  Mouth/Throat: Oropharynx is clear and moist. No oropharyngeal exudate.  Eyes: Conjunctivae and EOM are normal. Pupils are equal, round, and reactive to light. Right eye exhibits normal extraocular motion and no nystagmus. Left eye exhibits normal extraocular motion and no nystagmus.  Neck: Normal range of motion. Neck supple. Carotid bruit is not present.  Cardiovascular: Normal rate, regular rhythm, normal heart sounds and intact distal pulses.   No murmur heard. Pulmonary/Chest: Effort normal and breath sounds normal. No respiratory distress. She has no wheezes. She has no rales.  Musculoskeletal: She exhibits no edema.  No evident  numbness noted today. 5/5 strength BUE and BLE Some decreased sensation to monofilament testing bilateral feet.  Neurological: She is alert and oriented to person, place, and time. She has normal strength. No cranial nerve deficit or sensory deficit. Coordination normal.  Reflex Scores:      Bicep reflexes are 2+ on the right side and 2+ on the left side. CN 2-12 intact Normal FTN Sensation intact to light touch as well as temperature sensation No evident facial weakness or slurred speech.  Skin: Skin is warm and dry. No rash noted.  Psychiatric:  Very anxious      Assessment & Plan:

## 2012-05-10 NOTE — Addendum Note (Signed)
Addended by: Royann Shivers A on: 05/10/2012 05:50 PM   Modules accepted: Orders

## 2012-05-10 NOTE — Assessment & Plan Note (Addendum)
Longstanding peripheral neuropathy, however yesterday with isolated right facial numbness that has since resolved. Significant risk factors for CVA including uncontrolled HLD, controlled HTN, famhx. Recommended trial of lipitor at 20mg  daily (intolerant of crestor in past). Check for reversible causes of neuropathy with blood work today. Check direct LDL as not fasting today. Red flags to return here or seek urgent care discussed. No bruits appreciated today. To f/u with PCP in 1 month. Continue ASA 81mg  daily.

## 2012-05-11 LAB — VITAMIN D 25 HYDROXY (VIT D DEFICIENCY, FRACTURES): Vit D, 25-Hydroxy: 18 ng/mL — ABNORMAL LOW (ref 30–89)

## 2012-05-11 LAB — BASIC METABOLIC PANEL
BUN: 12 mg/dL (ref 6–23)
CO2: 23 mEq/L (ref 19–32)
Chloride: 100 mEq/L (ref 96–112)
Creat: 0.87 mg/dL (ref 0.50–1.10)
Glucose, Bld: 124 mg/dL — ABNORMAL HIGH (ref 70–99)

## 2012-05-13 ENCOUNTER — Other Ambulatory Visit: Payer: Self-pay | Admitting: Family Medicine

## 2012-05-13 DIAGNOSIS — E039 Hypothyroidism, unspecified: Secondary | ICD-10-CM

## 2012-05-13 MED ORDER — ERGOCALCIFEROL 1.25 MG (50000 UT) PO CAPS: 50000.0000 [IU] | ORAL_CAPSULE | ORAL | Status: DC

## 2012-05-13 MED ORDER — LEVOTHYROXINE SODIUM 137 MCG PO TABS: 137.0000 ug | ORAL_TABLET | Freq: Every day | ORAL | Status: DC

## 2012-05-13 NOTE — Addendum Note (Signed)
Addended by: Tonia Ghent on: 05/13/2012 08:15 AM   Modules accepted: Orders

## 2012-05-28 ENCOUNTER — Encounter: Payer: Self-pay | Admitting: *Deleted

## 2012-06-03 ENCOUNTER — Telehealth: Payer: Self-pay | Admitting: *Deleted

## 2012-06-03 MED ORDER — PREGABALIN 75 MG PO CAPS: 75.0000 mg | ORAL_CAPSULE | Freq: Two times a day (BID) | ORAL | Status: DC | PRN

## 2012-06-03 NOTE — Telephone Encounter (Signed)
Patient called and would like to try Lyrica (see lab note). Please send into pharmacy.

## 2012-06-03 NOTE — Telephone Encounter (Signed)
LMOVM of patient's cell phone.  Home phone disconnected.  Medication phoned to pharmacy.

## 2012-06-03 NOTE — Telephone Encounter (Signed)
Please call in. Start with 1 a day for 1 week, then increase to 1 twice a day if needed. Thanks. Keep the scheduled visit with me and bring a list of all meds.

## 2012-06-08 ENCOUNTER — Other Ambulatory Visit: Payer: Self-pay | Admitting: Family Medicine

## 2012-06-10 NOTE — Telephone Encounter (Signed)
Electronic refill request.  Please advise. 

## 2012-06-11 NOTE — Telephone Encounter (Signed)
Electronic refill request.  Please advise. 

## 2012-06-11 NOTE — Telephone Encounter (Signed)
Medication phoned to pharmacy.  

## 2012-06-11 NOTE — Telephone Encounter (Signed)
Please call in

## 2012-06-11 NOTE — Telephone Encounter (Signed)
Pt is out of xanax and had panic attack last weekend and request refill done today; pt has to go to [place] on 06/12/12 and makes pt nervous driving on highway.Please advise.

## 2012-06-14 ENCOUNTER — Ambulatory Visit (INDEPENDENT_AMBULATORY_CARE_PROVIDER_SITE_OTHER): Payer: BC Managed Care – PPO | Admitting: Family Medicine

## 2012-06-14 ENCOUNTER — Ambulatory Visit: Payer: BC Managed Care – PPO | Admitting: Family Medicine

## 2012-06-14 ENCOUNTER — Encounter: Payer: Self-pay | Admitting: Family Medicine

## 2012-06-14 VITALS — BP 144/102 | HR 81 | Temp 97.9°F | Wt 181.5 lb

## 2012-06-14 DIAGNOSIS — F411 Generalized anxiety disorder: Secondary | ICD-10-CM

## 2012-06-14 MED ORDER — IMIPRAMINE HCL 25 MG PO TABS: 25.0000 mg | ORAL_TABLET | Freq: Every day | ORAL | Status: DC

## 2012-06-14 NOTE — Patient Instructions (Addendum)
I would get back on the imipramine, taking 1 or 2 at night. See if that helps.  Give me an update early next week. Take care.

## 2012-06-14 NOTE — Progress Notes (Signed)
Her stomach sx are some better on reglan, zantac and prilosec per GI.  The f/u endoscopy is pending per GI.    Stress at work in increased. She is more tearful, worried. Her face gets red and her BP goes up. She'll get sweaty, nervous and shaky.  The episode will gradually/eventually pass.  Safe at home, situation is good at home with her husband.  Getting back on the effexor helped some; getting off it clearly made her worse.  She is out of imipramine. Hasn't started the lyrica yet for the neuropathy. She has needed xanax more frequently.  It helps some. She had to be out of work yesterday and today due to her worries. Her work shifts have gotten more difficult.    Meds, vitals, and allergies reviewed.   ROS: See HPI.  Otherwise, noncontributory.  Tearful but nad o/w.  No tremor.  GEN: nad, alert and oriented HEENT: mucous membranes moist NECK: supple w/o LA CV: rrr. PULM: ctab, no inc wob ABD: soft, +bs EXT: no edema SKIN: no acute rash

## 2012-06-16 NOTE — Assessment & Plan Note (Signed)
Exacerbated by recent work stress.>25 min spent with face to face with patient, >50% counseling and/or coordinating care.  Would restart and then inc TCA to 50mg  at night if needed. Continue other meds. Work note given. Okay for outpatient f/u.  She agrees.

## 2012-06-18 ENCOUNTER — Other Ambulatory Visit (INDEPENDENT_AMBULATORY_CARE_PROVIDER_SITE_OTHER): Payer: BC Managed Care – PPO

## 2012-06-18 DIAGNOSIS — E039 Hypothyroidism, unspecified: Secondary | ICD-10-CM

## 2012-06-18 LAB — TSH: TSH: 24.79 u[IU]/mL — ABNORMAL HIGH (ref 0.35–5.50)

## 2012-06-24 ENCOUNTER — Ambulatory Visit (INDEPENDENT_AMBULATORY_CARE_PROVIDER_SITE_OTHER): Payer: BC Managed Care – PPO | Admitting: Family Medicine

## 2012-06-24 ENCOUNTER — Encounter: Payer: Self-pay | Admitting: Family Medicine

## 2012-06-24 VITALS — BP 162/88 | HR 69 | Temp 97.9°F | Wt 183.0 lb

## 2012-06-24 DIAGNOSIS — E039 Hypothyroidism, unspecified: Secondary | ICD-10-CM

## 2012-06-24 DIAGNOSIS — F411 Generalized anxiety disorder: Secondary | ICD-10-CM

## 2012-06-24 DIAGNOSIS — G589 Mononeuropathy, unspecified: Secondary | ICD-10-CM

## 2012-06-24 MED ORDER — TELMISARTAN-HCTZ 80-25 MG PO TABS: 1.0000 | ORAL_TABLET | Freq: Every day | ORAL | Status: DC

## 2012-06-24 MED ORDER — METOPROLOL SUCCINATE ER 100 MG PO TB24: ORAL_TABLET | ORAL | Status: DC

## 2012-06-24 MED ORDER — LEVOTHYROXINE SODIUM 150 MCG PO TABS: 150.0000 ug | ORAL_TABLET | Freq: Every day | ORAL | Status: DC

## 2012-06-24 NOTE — Patient Instructions (Addendum)
Increase to 153mcg a day on the thyroid medicine.  Recheck TSH in about 7 weeks.  Call in or leave me a note and let me know about your fatigue and mood at that point.  I would hold off on starting the lyrica for now.  You could start it when the thyroid labs have normalized.  Take care.

## 2012-06-24 NOTE — Assessment & Plan Note (Signed)
Improved, continue with current meds.  Affect is brighter today, not tearful.

## 2012-06-24 NOTE — Assessment & Plan Note (Signed)
Improved, not normalized yet.  Inc to 136mcg a day and recheck in about 6-7 weeks.  She'll update me at that point.

## 2012-06-24 NOTE — Progress Notes (Signed)
Hypothyroid. TSH improved but still up. Discussed labs.  Her replacement was increased.  No dysphagia. No neck mass. She has some fatigue noted, but is working long hours.   Anxiety. Some better.  She is taking 1-2 imipramine at night with relief. She uses the lowest dose possible.  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  She is thinking about trying to move, but this adds to her workload.    Neuropathy.  She still has tingling in her arms and legs but isn't on the lyrica yet.  She wanted to make sure the anxiety was some better before starting this.  We talked about normalizing her TSH before starting lyrica.    She finished the once a week vit D replacement.  D/w pt.    BP has been improved on home checks, as low as 130s/90s.    Meds, vitals, and allergies reviewed.   ROS: See HPI.  Otherwise, noncontributory.  GEN: nad, alert and oriented HEENT: mucous membranes moist NECK: supple w/o LA, no TMG CV: rrr.  no murmur PULM: ctab, no inc wob ABD: soft, +bs EXT: no edema SKIN: no acute rash

## 2012-06-24 NOTE — Assessment & Plan Note (Signed)
She wants to hold off on the lyrica until her TSH is resolved. This is reasonable.

## 2012-06-24 NOTE — Addendum Note (Signed)
Addended by: Tonia Ghent on: 06/24/2012 08:53 AM   Modules accepted: Orders

## 2012-07-02 ENCOUNTER — Encounter: Payer: Self-pay | Admitting: Family Medicine

## 2012-07-02 ENCOUNTER — Encounter: Payer: Self-pay | Admitting: *Deleted

## 2012-07-02 ENCOUNTER — Ambulatory Visit (INDEPENDENT_AMBULATORY_CARE_PROVIDER_SITE_OTHER): Payer: BC Managed Care – PPO | Admitting: Family Medicine

## 2012-07-02 VITALS — BP 120/84 | HR 83 | Temp 98.2°F | Ht 63.0 in | Wt 182.5 lb

## 2012-07-02 DIAGNOSIS — F411 Generalized anxiety disorder: Secondary | ICD-10-CM

## 2012-07-02 DIAGNOSIS — M79605 Pain in left leg: Secondary | ICD-10-CM | POA: Insufficient documentation

## 2012-07-02 DIAGNOSIS — G589 Mononeuropathy, unspecified: Secondary | ICD-10-CM

## 2012-07-02 DIAGNOSIS — M79604 Pain in right leg: Secondary | ICD-10-CM

## 2012-07-02 DIAGNOSIS — M79609 Pain in unspecified limb: Secondary | ICD-10-CM

## 2012-07-02 NOTE — Assessment & Plan Note (Signed)
Multifactorial.. Likely due to venous insufficiency, neuropathy, anxiety and poor thyroid control.  Thyroid function is likely improving with recent changes. Mood is improved but improvement per pt is limited by the amount of pain she is having in her legs.  Recommended compression hose B legs daily and consistently, elevate legs and exercsie to treat venous insufficiency. Start lyrica... Benefit at this point out weights any risk of SE.

## 2012-07-02 NOTE — Progress Notes (Signed)
Subjective:    Patient ID: Elizabeth Rivera, female    DOB: [birth year], 62 y.o.   MRN: [id number]  HPI 62 year old female with recently poor controlled anxiety (saw PCP 1 week ago) as well as HTN, high chol on statin, low thyroid ( recently poor control, has had med adjustments), venous insufficiency and long standing neuropathy presents with  B leg pain.  She has chronic leg pain from venous insufficiency and neuropathy... worse though beginning several days ago when getting home form work. Pain is tremendous, worse with walking. She feels like legs are going to give out. Pain in thighs and calves and ankles.  She noted B swelling in ankles.. Veins were protruding in thghs and ankles Has improved some not being on her legs for a few days.  Cannot feel the bottom of her feet.  On imipramine and effexor for mood...she has noted 25 % improvement since 5/19. Now able to sleep at night.   Her entire back top to bottom is sensitive to touch.   At work she walks constantly at work on [employer]. Has history of back injury in her 58s. Has history of arthritis in back.   She has yet to try lyrica because she was afraid that it may make anxiety worse.    Review of Systems  Constitutional: Positive for fatigue. Negative for fever.  HENT: Negative for ear pain.   Eyes: Negative for pain.  Respiratory: Negative for chest tightness and shortness of breath.   Cardiovascular: Negative for chest pain, palpitations and leg swelling.  Gastrointestinal: Negative for abdominal pain.  Genitourinary: Negative for dysuria.       Objective:   Physical Exam  Constitutional: Vital signs are normal. She appears well-developed and well-nourished. She is cooperative.  Non-toxic appearance. She does not appear ill. No distress.  HENT:  Head: Normocephalic.  Right Ear: Hearing, tympanic membrane, external ear and ear canal normal. Tympanic membrane is not erythematous, not retracted and not bulging.  Left  Ear: Hearing, tympanic membrane, external ear and ear canal normal. Tympanic membrane is not erythematous, not retracted and not bulging.  Nose: No mucosal edema or rhinorrhea. Right sinus exhibits no maxillary sinus tenderness and no frontal sinus tenderness. Left sinus exhibits no maxillary sinus tenderness and no frontal sinus tenderness.  Mouth/Throat: Uvula is midline, oropharynx is clear and moist and mucous membranes are normal.  Eyes: Conjunctivae, EOM and lids are normal. Pupils are equal, round, and reactive to light. No foreign bodies found.  Neck: Trachea normal and normal range of motion. Neck supple. Carotid bruit is not present. No mass and no thyromegaly present.  Cardiovascular: Normal rate, regular rhythm, S1 normal, S2 normal, normal heart sounds, intact distal pulses and normal pulses.  Exam reveals no gallop and no friction rub.   No murmur heard. Severe B varicose veins up to thighs, 1 plus B nonpitting edema N  Pulmonary/Chest: Effort normal and breath sounds normal. Not tachypneic. No respiratory distress. She has no decreased breath sounds. She has no wheezes. She has no rhonchi. She has no rales.  Abdominal: Soft. Normal appearance and bowel sounds are normal. There is no tenderness.  Musculoskeletal:       Cervical back: She exhibits tenderness. She exhibits no bony tenderness.       Lumbar back: Normal. She exhibits normal range of motion and no tenderness.  No sensation to touch  with monofilament in B soles of feet  Neg SLR, faber's  B trapezius ttp  Neurological: She is alert.  Skin: Skin is warm, dry and intact. No rash noted.  Psychiatric: Her speech is normal and behavior is normal. Judgment and thought content normal. Her mood appears not anxious. Cognition and memory are normal. She does not exhibit a depressed mood.          Assessment & Plan:

## 2012-07-02 NOTE — Assessment & Plan Note (Signed)
Poor control. Imipramine has not helped her neuropathy at all.  We will have her start lyrica now as previously instructed by her PCP. Pt is agreeable.

## 2012-07-02 NOTE — Patient Instructions (Addendum)
Elevate feet above heart. Start wearing compression hose during the day. Start lyrica. Low dose. Work on low impact  regular exercise. Follow up in 2 weeks if no better with leg pain.

## 2012-07-02 NOTE — Assessment & Plan Note (Signed)
Improving some with recent medication changes.

## 2012-07-04 ENCOUNTER — Encounter: Payer: Self-pay | Admitting: Family Medicine

## 2012-07-09 ENCOUNTER — Encounter: Payer: Self-pay | Admitting: Family Medicine

## 2012-07-09 ENCOUNTER — Ambulatory Visit (INDEPENDENT_AMBULATORY_CARE_PROVIDER_SITE_OTHER): Payer: BC Managed Care – PPO | Admitting: Family Medicine

## 2012-07-09 VITALS — BP 144/94 | HR 81 | Temp 97.8°F | Wt 183.5 lb

## 2012-07-09 DIAGNOSIS — F411 Generalized anxiety disorder: Secondary | ICD-10-CM

## 2012-07-09 DIAGNOSIS — G589 Mononeuropathy, unspecified: Secondary | ICD-10-CM

## 2012-07-09 MED ORDER — ALPRAZOLAM 0.25 MG PO TABS: 0.2500 mg | ORAL_TABLET | Freq: Three times a day (TID) | ORAL | Status: DC | PRN

## 2012-07-09 NOTE — Progress Notes (Signed)
She is still out of work.  "My nerves (ie anxiety) are worse."  She'll get anxious and sweaty.  Recently with the death of a friend noted.  More tearful recently.  She has a more pronounced fear of crowds now.  She wasn't able to go to the funeral home due to the fear of crowds.  She has no SI/HI.  She has more fear and anxiety related to driving a car.  She is taking xanax prn, needed daily in recent past.    Her legs are some better. She is on the lyrica now and w/o ADE.  She still has pain in the fingertips and toes, but the lyrica has cut this back some.   Her BP has been up episodically.  She saw Dr. Jacqlyn Larsen about her adrenal mass recently; he is following this.    Pt has sig FH of anxiety, mult family members with admission to psych facility, mult family members with prev suicide history.    Meds, vitals, and allergies reviewed.   ROS: See HPI.  Otherwise, noncontributory.  nad but tearful and anxious Mmm rrr ctab abd soft, not ttp Ext w/o edema

## 2012-07-09 NOTE — Patient Instructions (Addendum)
See Rosaria Ferries about your referral before you leave today (Dr. Rexene Edison). Take 2 xanax at a time if needed.  Have work send me the [employer].  Take care.  Call back with an update next week.

## 2012-07-11 ENCOUNTER — Other Ambulatory Visit: Payer: Self-pay | Admitting: *Deleted

## 2012-07-11 MED ORDER — TELMISARTAN-HCTZ 80-25 MG PO TABS: 1.0000 | ORAL_TABLET | Freq: Every day | ORAL | Status: DC

## 2012-07-11 MED ORDER — METOPROLOL SUCCINATE ER 100 MG PO TB24: ORAL_TABLET | ORAL | Status: DC

## 2012-07-11 NOTE — Assessment & Plan Note (Signed)
Improved on lyrica, continue.

## 2012-07-11 NOTE — Assessment & Plan Note (Signed)
Decompensated, with a fear of crowds.  Not able to work currently.  Okay for outpatient f/u.  No SI/HI.  Will refer for counseling with Dr. Rexene Edison.  Inc xanax to 2 pills at a time prn anxiety.  Continue other meds.  She agrees.  Last work day was 06/28/12.  Out of work with expected/potential return date of 07/23/12.  She'll request FMLA papers sent to me.

## 2012-07-18 ENCOUNTER — Encounter: Payer: Self-pay | Admitting: Family Medicine

## 2012-07-18 ENCOUNTER — Ambulatory Visit (INDEPENDENT_AMBULATORY_CARE_PROVIDER_SITE_OTHER): Payer: BC Managed Care – PPO | Admitting: Family Medicine

## 2012-07-18 ENCOUNTER — Encounter: Payer: Self-pay | Admitting: *Deleted

## 2012-07-18 VITALS — BP 132/100 | HR 92 | Temp 97.9°F | Wt 180.5 lb

## 2012-07-18 DIAGNOSIS — F411 Generalized anxiety disorder: Secondary | ICD-10-CM

## 2012-07-18 MED ORDER — ALPRAZOLAM ER 1 MG PO TB24: 1.0000 mg | ORAL_TABLET | ORAL | Status: DC

## 2012-07-18 NOTE — Progress Notes (Signed)
"  Terrible" anxiety.   "It feels like I'm falling apart."  More anxiety with crowds.  She'll get sweaty, nervous, worried.  She hasn't been back to work yet. She tried the higher dose of the xanax w/o much effect.  When she gets upset she tends to have more GI upset and sensation fecal urgency.  No SI/HI.  She has f/u with Dr. Rexene Edison pending.  She is more "jumpy".  "I almost jumped out of my skin the other day when I got surprised."    She is thinking about her options with work. She didn't know if she could/should try for disability.  She has 28 year with her company "and I wanted to go for 30 and I don't want to give in, but I don't know if I can keep up with work."  She was put on leave of absence for medical care.    She continues to have pain the tips of her toes and fingers.  Hip pain has increased recently, "where I can't get comfortable."  Meds, vitals, and allergies reviewed.   ROS: See HPI.  Otherwise, noncontributory.  Speech fluent but worried appearing, no apparent respiratory distress.  Crying during interview.   Mmm rrr Ctab Ext w/o edema

## 2012-07-18 NOTE — Assessment & Plan Note (Signed)
She'll call about trying to get in with Dr. Rexene Edison sooner. App Dr. Celestia Khat help. Add on xanax xr with sedation caution.  Use short acting xanax prn, sedation caution.  She agrees.  Continue other meds.  No SI/HI.  Letter given for work.  Call back with update next week.  >25 min spent with face to face with patient, >50% counseling and/or coordinating care

## 2012-07-18 NOTE — Patient Instructions (Addendum)
Take 1mg  of xanax XR each AM. See if that helps.  If you are still very anxious and not drowsy, then you can take the 0.25mg  regular xanax as needed.  Both can make you drowsy.  Call [phone] and see if you can get your appointment with Dr. Rexene Edison moved up.  Let me know how you are doing next week.  Take care.

## 2012-07-25 ENCOUNTER — Ambulatory Visit: Payer: BC Managed Care – PPO | Admitting: Psychology

## 2012-07-29 DIAGNOSIS — M940 Chondrocostal junction syndrome [Tietze]: Secondary | ICD-10-CM | POA: Insufficient documentation

## 2012-07-31 ENCOUNTER — Ambulatory Visit (INDEPENDENT_AMBULATORY_CARE_PROVIDER_SITE_OTHER): Payer: BC Managed Care – PPO | Admitting: Psychology

## 2012-07-31 DIAGNOSIS — F4323 Adjustment disorder with mixed anxiety and depressed mood: Secondary | ICD-10-CM

## 2012-07-31 DIAGNOSIS — F411 Generalized anxiety disorder: Secondary | ICD-10-CM

## 2012-08-02 ENCOUNTER — Encounter: Payer: Self-pay | Admitting: Family Medicine

## 2012-08-02 ENCOUNTER — Ambulatory Visit (INDEPENDENT_AMBULATORY_CARE_PROVIDER_SITE_OTHER): Payer: BC Managed Care – PPO | Admitting: Family Medicine

## 2012-08-02 VITALS — BP 134/92 | HR 84 | Temp 97.5°F | Wt 187.5 lb

## 2012-08-02 DIAGNOSIS — F411 Generalized anxiety disorder: Secondary | ICD-10-CM

## 2012-08-02 NOTE — Patient Instructions (Addendum)
Don't change your meds for now and I'll work on your papers.  Let's plan on getting back together in about 3-4 weeks, sooner if needed.   Take care. Glad to see you.

## 2012-08-02 NOTE — Progress Notes (Signed)
"  I'm not fixed but I'm maybe starting to be a little better."  She still gets startled and jumpy- she dropped a glass and this was very startling to her.  No SI HI.  She has been compliant with meds.  She had f/u with Dr. Rexene Edison, will have f/u again.  She is doing better on the xanax xr w/o need for the short acting xanax.  She isn't drowsy on the medicine but she is slightly calmer.    She continues to have some leg pain; some BLE edema in the evenings.  H/o L knee pain prev, "so this isn't surprising."  She still has some foot pain B.  Walking some for exercise.   She was given papers for her work absence.    She has seen Dr. Jacqlyn Larsen in the interval.   Her BP is improved today.    Tingling in her hands and feet are better with the lyrica.    Meds, vitals, and allergies reviewed.   ROS: See HPI.  Otherwise, noncontributory.  Nad, not tearful today (noted change from prev) Speech wnl rrr ctab No tremor Judgement intact

## 2012-08-04 NOTE — Assessment & Plan Note (Signed)
Some improvement, not ready to return to work.  I'll discuss with Dr. Rexene Edison and she'll continue current meds for now.  No change in meds.  Okay for outpatient f/u.  I'll work on her papers for her job.  She agres with plan.

## 2012-08-12 ENCOUNTER — Other Ambulatory Visit (INDEPENDENT_AMBULATORY_CARE_PROVIDER_SITE_OTHER): Payer: BC Managed Care – PPO

## 2012-08-12 DIAGNOSIS — E039 Hypothyroidism, unspecified: Secondary | ICD-10-CM

## 2012-08-12 LAB — TSH: TSH: 48.43 u[IU]/mL — ABNORMAL HIGH (ref 0.35–5.50)

## 2012-08-13 ENCOUNTER — Other Ambulatory Visit: Payer: Self-pay | Admitting: Family Medicine

## 2012-08-13 DIAGNOSIS — E039 Hypothyroidism, unspecified: Secondary | ICD-10-CM

## 2012-08-13 MED ORDER — LEVOTHYROXINE SODIUM 175 MCG PO TABS: 175.0000 ug | ORAL_TABLET | Freq: Every day | ORAL | Status: DC

## 2012-08-14 ENCOUNTER — Other Ambulatory Visit: Payer: Self-pay

## 2012-08-14 DIAGNOSIS — E039 Hypothyroidism, unspecified: Secondary | ICD-10-CM

## 2012-08-14 MED ORDER — LEVOTHYROXINE SODIUM 175 MCG PO TABS: 175.0000 ug | ORAL_TABLET | Freq: Every day | ORAL | Status: DC

## 2012-08-14 NOTE — Telephone Encounter (Signed)
Pt request refill levothyroxine 175 mcg to [facility]. With one refill until gets mail order med. Spoke with Ronalee Belts at [facility]. And gave med as instructed. Asked Ronalee Belts to cancel other mcgs of levothyroxine so no confusion with what dosage pt should be taking. Pt notified done.

## 2012-08-19 ENCOUNTER — Other Ambulatory Visit: Payer: Self-pay | Admitting: Family Medicine

## 2012-08-19 MED ORDER — ALPRAZOLAM 0.25 MG PO TABS: 0.2500 mg | ORAL_TABLET | Freq: Three times a day (TID) | ORAL | Status: DC | PRN

## 2012-08-19 NOTE — Telephone Encounter (Signed)
Please call in

## 2012-08-19 NOTE — Telephone Encounter (Signed)
Last filled 07/05/12

## 2012-08-20 NOTE — Telephone Encounter (Signed)
Phoned in to [place].

## 2012-08-27 ENCOUNTER — Encounter: Payer: Self-pay | Admitting: Family Medicine

## 2012-08-27 ENCOUNTER — Ambulatory Visit (INDEPENDENT_AMBULATORY_CARE_PROVIDER_SITE_OTHER): Payer: BC Managed Care – PPO | Admitting: Psychology

## 2012-08-27 ENCOUNTER — Ambulatory Visit (INDEPENDENT_AMBULATORY_CARE_PROVIDER_SITE_OTHER): Payer: BC Managed Care – PPO | Admitting: Family Medicine

## 2012-08-27 VITALS — BP 122/80 | HR 76 | Temp 98.2°F | Wt 185.0 lb

## 2012-08-27 DIAGNOSIS — F4323 Adjustment disorder with mixed anxiety and depressed mood: Secondary | ICD-10-CM

## 2012-08-27 DIAGNOSIS — F411 Generalized anxiety disorder: Secondary | ICD-10-CM

## 2012-08-27 DIAGNOSIS — E278 Other specified disorders of adrenal gland: Secondary | ICD-10-CM

## 2012-08-27 DIAGNOSIS — G589 Mononeuropathy, unspecified: Secondary | ICD-10-CM

## 2012-08-27 MED ORDER — PREGABALIN 75 MG PO CAPS: ORAL_CAPSULE | ORAL | Status: DC

## 2012-08-27 NOTE — Progress Notes (Signed)
Neuropathy pain is worse per patient.  More in the hands/fingers, distal portion of the feet.  She is still on lyrica.    "My nerves are some better."  She had a few bad days after she found out her job would be posted, she cried for 2 days when she found out about that.  She had worked there for 28+ years.  "It hurt my feelings but I had to put that aside."  She still is working through the death of another friend but she couldn't go to the funeral. She had gone to see Dr. Rexene Edison with counseling.  She has 4 weeks of leave left.  At that point she'll have to make other arrangements and she is considering her options.  Her husband is still working.  She hasn't needed the immediate release xanax with the addition of the long acting xanax.  She is still pretty jumpy with loud or unexpected noises. She is going to see Dr. Rexene Edison again this afternoon.   She had seen Dr. Jacqlyn Larsen and no intervention is planned for now on the adrenal mass.  She still has some back pain that is positional.  She had talked to Dr. Jacqlyn Larsen about this.  She had used hydrocodone with some relief per Dr. Jacqlyn Larsen.    She has some progressive edema on the legs as the day goes on.    Meds, vitals, and allergies reviewed.   ROS: See HPI.  Otherwise, noncontributory.  GEN: nad, alert and oriented, not tearful today HEENT: mucous membranes moist NECK: supple w/o LA CV: rrr. PULM: ctab, no inc wob ABD: soft, +bs EXT: no edema SKIN: no acute rash Stocking and glove distribution of dec in sensation.  No weakness.

## 2012-08-27 NOTE — Patient Instructions (Addendum)
Talk to Dr. Jacqlyn Larsen about the pain medicine.   Try taking 3 lyrica a day for the neuropathy and talk with Dr. Rexene Edison this afternoon.  Recheck TSH in early 9/14.

## 2012-08-28 NOTE — Assessment & Plan Note (Signed)
Replacing for high TSH in meantime, f/u TSH pending, not due yet.  Prev w/u done.  Would inc lyrica in meantime.  Prev had helped some.  She agrees.

## 2012-08-28 NOTE — Assessment & Plan Note (Signed)
Per Dr. Jacqlyn Larsen.

## 2012-08-28 NOTE — Assessment & Plan Note (Signed)
"  My nerves are some better."  She had a few bad days after she found out her job would be posted, she cried for 2 days when she found out about that.  She had worked there for 28+ years.  "It hurt my feelings but I had to put that aside."  She still is working through the death of another friend but she couldn't go to the funeral. She had gone to see Dr. Rexene Edison with counseling.  She has 4 weeks of leave left.  At that point she'll have to make other arrangements and she is considering her options.  Her husband is still working.  She hasn't needed the immediate release xanax with the addition of the long acting xanax.  She is still pretty jumpy with loud or unexpected noises. She is going to see Dr. Rexene Edison again this afternoon.  Continue current meds.  I would still hold her out of work given the increased startle reflex that she has.  She'll consider her options in the meantime.

## 2012-09-16 ENCOUNTER — Encounter: Payer: Self-pay | Admitting: Family Medicine

## 2012-09-16 ENCOUNTER — Ambulatory Visit (INDEPENDENT_AMBULATORY_CARE_PROVIDER_SITE_OTHER): Payer: BC Managed Care – PPO | Admitting: Family Medicine

## 2012-09-16 VITALS — BP 128/80 | HR 88 | Temp 98.2°F | Wt 183.5 lb

## 2012-09-16 DIAGNOSIS — G589 Mononeuropathy, unspecified: Secondary | ICD-10-CM

## 2012-09-16 DIAGNOSIS — G609 Hereditary and idiopathic neuropathy, unspecified: Secondary | ICD-10-CM

## 2012-09-16 DIAGNOSIS — I10 Essential (primary) hypertension: Secondary | ICD-10-CM

## 2012-09-16 DIAGNOSIS — F411 Generalized anxiety disorder: Secondary | ICD-10-CM

## 2012-09-16 MED ORDER — ALPRAZOLAM 0.25 MG PO TABS: 0.2500 mg | ORAL_TABLET | Freq: Three times a day (TID) | ORAL | Status: DC | PRN

## 2012-09-16 MED ORDER — ALPRAZOLAM ER 1 MG PO TB24: 1.0000 mg | ORAL_TABLET | ORAL | Status: DC

## 2012-09-16 NOTE — Progress Notes (Signed)
She needs a fasting lipid and sugar for her wellness plan. This was ordered today.  She'll return for that.    Neuropathy.  She continues to have hand and foot pain and the cold seems to be a trigger.  She has had pain even when she isn't in the cold.    Anxiety.  Still out of work.  "Still very nervous it something happens out of the ordinary."  Still very hyperalert with noted startle reflex to noises.  She is compliant with meds.  It's noted that she has a "complete nervous breakdown" previously with period of (likely stress induced) temporary amnesia years ago.  She tends to be the most anxious in the afternoon and evening.    She has had f/u with psychology recently.  She is trying to work through the thoughts about possibly not returning to work.  She has f/u with Dr. Rexene Edison pending.  Her BP has improved as her anxiety has improved some.   She continues to have abd pain and will call Dr. Vira Agar about follow up. She had some improvement prev, but not recently.    Meds, vitals, and allergies reviewed.   ROS: See HPI.  Otherwise, noncontributory.  Appears worried but not tearful nad ncat Mmm rrr ctab abd soft

## 2012-09-16 NOTE — Assessment & Plan Note (Signed)
Only with partial response.  Refer to neuro for eval.

## 2012-09-16 NOTE — Assessment & Plan Note (Signed)
She is improved from prev but not to the point of being able to return to work.  Would inc the short acting xanax in the afternoon and we'll follow clinically.  Continue counseling.  She agrees.  No SI/HI. Okay for outpatient f/u.

## 2012-09-16 NOTE — Patient Instructions (Addendum)
Come back for labs visit (fasting) for the sugar and cholesterol.  Thyroid check is later in the fall.   Call Dr. Vira Agar about getting checked again.  Take the 1mg  of xanax ER in the AM.  Take the 0.25mg  of xanax in the afternoon.  Recheck in about 6 weeks.   Take care.

## 2012-09-17 ENCOUNTER — Telehealth: Payer: Self-pay

## 2012-09-17 ENCOUNTER — Other Ambulatory Visit (INDEPENDENT_AMBULATORY_CARE_PROVIDER_SITE_OTHER): Payer: BC Managed Care – PPO

## 2012-09-17 DIAGNOSIS — E782 Mixed hyperlipidemia: Secondary | ICD-10-CM

## 2012-09-17 DIAGNOSIS — R7989 Other specified abnormal findings of blood chemistry: Secondary | ICD-10-CM

## 2012-09-17 DIAGNOSIS — E039 Hypothyroidism, unspecified: Secondary | ICD-10-CM

## 2012-09-17 DIAGNOSIS — E669 Obesity, unspecified: Secondary | ICD-10-CM

## 2012-09-17 DIAGNOSIS — I10 Essential (primary) hypertension: Secondary | ICD-10-CM

## 2012-09-17 LAB — LIPID PANEL
Total CHOL/HDL Ratio: 7
VLDL: 45.8 mg/dL — ABNORMAL HIGH (ref 0.0–40.0)

## 2012-09-17 LAB — GLUCOSE, RANDOM: Glucose, Bld: 205 mg/dL — ABNORMAL HIGH (ref 70–99)

## 2012-09-17 LAB — LDL CHOLESTEROL, DIRECT: Direct LDL: 202.7 mg/dL

## 2012-09-17 NOTE — Telephone Encounter (Signed)
Pt left v/m returning call; pt does not have good reception on cell phone; pt request cb; pt not sure if paperwork is ready or lab results.Please advise.

## 2012-09-18 NOTE — Telephone Encounter (Signed)
Left message for patient to call back  

## 2012-09-19 ENCOUNTER — Other Ambulatory Visit: Payer: Self-pay | Admitting: Family Medicine

## 2012-09-19 ENCOUNTER — Telehealth: Payer: Self-pay | Admitting: Family Medicine

## 2012-09-19 ENCOUNTER — Other Ambulatory Visit (INDEPENDENT_AMBULATORY_CARE_PROVIDER_SITE_OTHER): Payer: BC Managed Care – PPO

## 2012-09-19 DIAGNOSIS — R7309 Other abnormal glucose: Secondary | ICD-10-CM

## 2012-09-19 DIAGNOSIS — R739 Hyperglycemia, unspecified: Secondary | ICD-10-CM

## 2012-09-19 DIAGNOSIS — E039 Hypothyroidism, unspecified: Secondary | ICD-10-CM

## 2012-09-19 NOTE — Telephone Encounter (Signed)
Patient advised that paperwork is ready for pickup.  Left at front desk for pick up.  Patient also advised to go to lab while she is here to get the A1C drawn.

## 2012-09-19 NOTE — Telephone Encounter (Signed)
Dr. Damita Dunnings asked to Terri to find out if the patient was fasting.  Terri spoke to patient.  She was fasting.  Dr. Damita Dunnings then asked if an A1C could be added and it cannot be added to the original blood sample.  Patient is going to drop some paperwork by to be filled out and Karna Christmas has a left a message for her to come to the lab at that time for an A1C.

## 2012-09-19 NOTE — Telephone Encounter (Signed)
I believe [facility] already has this for the patient.

## 2012-09-19 NOTE — Telephone Encounter (Signed)
[place] dropped off a Physician Result Form for Dr. Damita Dunnings to complete. The best number to reach Saira is [phone].  Thanks, Ebony Hail

## 2012-09-20 LAB — HEMOGLOBIN A1C: Hgb A1c MFr Bld: 8.4 % — ABNORMAL HIGH (ref 4.6–6.5)

## 2012-09-20 NOTE — Telephone Encounter (Signed)
Done.  Patient advised. Form left at front desk for pick up.

## 2012-09-22 ENCOUNTER — Other Ambulatory Visit: Payer: Self-pay | Admitting: Family Medicine

## 2012-09-22 DIAGNOSIS — E119 Type 2 diabetes mellitus without complications: Secondary | ICD-10-CM

## 2012-09-22 MED ORDER — ATORVASTATIN CALCIUM 20 MG PO TABS: 40.0000 mg | ORAL_TABLET | Freq: Every day | ORAL | Status: DC

## 2012-09-27 ENCOUNTER — Other Ambulatory Visit: Payer: Self-pay | Admitting: Family Medicine

## 2012-09-27 ENCOUNTER — Encounter: Payer: Self-pay | Admitting: Family Medicine

## 2012-09-27 DIAGNOSIS — E1149 Type 2 diabetes mellitus with other diabetic neurological complication: Secondary | ICD-10-CM | POA: Insufficient documentation

## 2012-09-30 NOTE — Telephone Encounter (Signed)
Please clarify, she should still have a refill.  Thanks.

## 2012-09-30 NOTE — Telephone Encounter (Signed)
Electronic refill request.  Please advise. 

## 2012-10-01 ENCOUNTER — Ambulatory Visit: Payer: Self-pay | Admitting: Psychology

## 2012-10-01 ENCOUNTER — Other Ambulatory Visit: Payer: Self-pay | Admitting: *Deleted

## 2012-10-01 DIAGNOSIS — F411 Generalized anxiety disorder: Secondary | ICD-10-CM

## 2012-10-01 DIAGNOSIS — F4323 Adjustment disorder with mixed anxiety and depressed mood: Secondary | ICD-10-CM

## 2012-10-04 ENCOUNTER — Ambulatory Visit (INDEPENDENT_AMBULATORY_CARE_PROVIDER_SITE_OTHER): Payer: BC Managed Care – PPO | Admitting: Psychology

## 2012-10-04 ENCOUNTER — Ambulatory Visit: Payer: BC Managed Care – PPO | Admitting: Neurology

## 2012-10-04 DIAGNOSIS — F4323 Adjustment disorder with mixed anxiety and depressed mood: Secondary | ICD-10-CM

## 2012-10-04 DIAGNOSIS — F411 Generalized anxiety disorder: Secondary | ICD-10-CM

## 2012-10-08 ENCOUNTER — Telehealth: Payer: Self-pay | Admitting: Family Medicine

## 2012-10-08 NOTE — Telephone Encounter (Signed)
Attending Physicians Statement dropped off for Dr. Damita Dunnings to complete. The best number to reach Kinsly is [phone].

## 2012-10-08 NOTE — Telephone Encounter (Signed)
I'll look at the hard copy.

## 2012-10-09 ENCOUNTER — Ambulatory Visit: Payer: Self-pay | Admitting: Family Medicine

## 2012-10-10 ENCOUNTER — Other Ambulatory Visit: Payer: Self-pay

## 2012-10-10 MED ORDER — GLUCOSE BLOOD VI STRP: ORAL_STRIP | Status: DC

## 2012-10-10 MED ORDER — BAYER MICROLET LANCETS MISC: Status: DC

## 2012-10-10 NOTE — Telephone Encounter (Signed)
Please send in 200 of both, 3rf.  Checking sugar BID due to uncontrolled DM2.  Thanks.

## 2012-10-10 NOTE — Telephone Encounter (Signed)
Pt was seen at [facility] on 10/09/12 and request bayer micorlet lancets and Bayer Contour Next test strips to [place] at [facility] advised pt to ck BS twice a day.Please advise.

## 2012-10-14 ENCOUNTER — Telehealth: Payer: Self-pay

## 2012-10-14 NOTE — Telephone Encounter (Signed)
Pt left v/m wanting to know if papers for insurance are completed. Left v/m for pt to cb to confirm what papers pt is asking about.

## 2012-10-15 ENCOUNTER — Telehealth: Payer: Self-pay | Admitting: *Deleted

## 2012-10-15 NOTE — Telephone Encounter (Signed)
Faxed form is in your In Box.  I have spoken to the patient and she states she only needs the lancets and the test strips.  She already has a glucose monitor.  She has already attended one of the classes at the [facility] and will be attending 2 more classes for the next 2 Mondays.

## 2012-10-15 NOTE — Telephone Encounter (Signed)
Signed, thanks

## 2012-10-15 NOTE — Telephone Encounter (Signed)
Patient advised.  Info left at front desk for pick up.

## 2012-10-15 NOTE — Telephone Encounter (Signed)
Form faxed and scanned.

## 2012-10-17 ENCOUNTER — Ambulatory Visit: Payer: BC Managed Care – PPO | Admitting: Neurology

## 2012-10-18 ENCOUNTER — Telehealth: Payer: Self-pay

## 2012-10-18 NOTE — Telephone Encounter (Signed)
[facility] sent PA for contour test strips; spoke with [place] at [phone] reason for denial pts coverage is if diabetic supplies comes from [place] is presently processing test strips. Spoke with [place] at [facility] and d/c request.

## 2012-10-21 ENCOUNTER — Ambulatory Visit (INDEPENDENT_AMBULATORY_CARE_PROVIDER_SITE_OTHER): Payer: BC Managed Care – PPO | Admitting: Family Medicine

## 2012-10-21 ENCOUNTER — Encounter: Payer: Self-pay | Admitting: Family Medicine

## 2012-10-21 VITALS — BP 116/76 | HR 75 | Temp 98.0°F | Wt 176.5 lb

## 2012-10-21 DIAGNOSIS — F411 Generalized anxiety disorder: Secondary | ICD-10-CM

## 2012-10-21 DIAGNOSIS — E1165 Type 2 diabetes mellitus with hyperglycemia: Secondary | ICD-10-CM

## 2012-10-21 MED ORDER — METFORMIN HCL 500 MG PO TABS: 500.0000 mg | ORAL_TABLET | Freq: Every day | ORAL | Status: DC

## 2012-10-21 NOTE — Progress Notes (Signed)
She is "down" about the DM2 dx but trying to work through this.  She is trying to make this magangable.  Has f/u with DM2 education, 1st class done.  She still has numbness and pain in the feet.  Sugars have still be >200 with polydypsia and polyuria.  We talked about meds at this point. She is losing weight intentionally.    She is still anxious and down about the recent events.  She is still nervous and worried.   Meds, vitals, and allergies reviewed.   ROS: See HPI.  Otherwise, noncontributory.  GEN: nad, alert and oriented, affect flat but speech is complete and judgement appears intact HEENT: mucous membranes mildly dry NECK: supple w/o LA CV: rrr.  PULM: ctab, no inc wob ABD: soft, +bs EXT: no edema SKIN: no acute rash  Diabetic foot exam: Normal inspection No skin breakdown No calluses  Normal DP pulses Dec sensation to light touch and monofilament Nails normal

## 2012-10-21 NOTE — Patient Instructions (Addendum)
Keep working on your weight and then add on the metformin.  Let me know about your sugars at the end of the month.  Take care.

## 2012-10-22 NOTE — Assessment & Plan Note (Signed)
Would continue meds and counseling.  It doesn't appear she can return to work with current sx and situation.  She agrees .

## 2012-10-22 NOTE — Assessment & Plan Note (Signed)
Add on metformin, continue ed classes and weight mgmt. She agrees.  She'll update me on her sugars.  Okay for outpatient fu.  Advised to stay hydrated.

## 2012-10-23 ENCOUNTER — Ambulatory Visit (INDEPENDENT_AMBULATORY_CARE_PROVIDER_SITE_OTHER): Payer: BC Managed Care – PPO | Admitting: Psychology

## 2012-10-23 DIAGNOSIS — F411 Generalized anxiety disorder: Secondary | ICD-10-CM

## 2012-10-23 DIAGNOSIS — F4323 Adjustment disorder with mixed anxiety and depressed mood: Secondary | ICD-10-CM

## 2012-10-28 ENCOUNTER — Telehealth: Payer: Self-pay

## 2012-10-28 MED ORDER — METFORMIN HCL 500 MG PO TABS: ORAL_TABLET | ORAL | Status: DC

## 2012-10-28 NOTE — Telephone Encounter (Signed)
Pt left v/m was seen 10/21/12 and pt was to cb with glucose readings. 10/22/12 FBS 283 rechecked after supper 289;  10/23/12 FBS 279 and evening 383;  10/24/12 FBS 231 and evening was 285;  10/25/12 FBS 250 and evening was 323; 10/26/12 FBS 230 and evening 310; 09/21/14FBS 245 and evening 310 and  10/28/12 FBS 235. Pt has been taking metformin 500 mg before breakfast and pt is watching diet,; pt is very stressed.Please advise. [place].

## 2012-10-28 NOTE — Telephone Encounter (Signed)
I would increase the metformin up to 1 tab BID for about 1 week, and then 2 in AM and 1 in PM.  If diarrhea, then cut the dose by 1 pill per day.  Call back with update on sugars next week.  I think this will help.  Thanks.  Med list adjusted.

## 2012-10-28 NOTE — Telephone Encounter (Signed)
Left message on patient's voicemail to return call

## 2012-10-29 ENCOUNTER — Telehealth: Payer: Self-pay | Admitting: Family Medicine

## 2012-10-29 NOTE — Telephone Encounter (Signed)
[facility] sent you a message that this patient cancelled the 1st appt with Dr Tomi Likens , No Showed for the second appt with Dr Tomi Likens and cancelled the third appt with Dr Tomi Likens. They will not let her schedule another appt if she cancels or no shows again.

## 2012-10-29 NOTE — Telephone Encounter (Signed)
I don't think I knew about this until this phone note.  Please call pt about this.  Give her the message.  Thanks.

## 2012-10-29 NOTE — Telephone Encounter (Signed)
Patient advised.

## 2012-10-30 ENCOUNTER — Ambulatory Visit: Payer: BC Managed Care – PPO | Admitting: Neurology

## 2012-10-31 NOTE — Telephone Encounter (Signed)
Patient advised.

## 2012-11-06 ENCOUNTER — Ambulatory Visit: Payer: Self-pay | Admitting: Family Medicine

## 2012-11-08 ENCOUNTER — Telehealth: Payer: Self-pay | Admitting: *Deleted

## 2012-11-08 NOTE — Telephone Encounter (Signed)
Blood sugars taking 1 tablet twice daily:   AM  PM 9/23  231  235  9/24  181  289 9/25  172  250  9/26  194  383 9/27  175  230 Started 2 tablets in the AM and one tablet PM 9/28  178  248 9/29  172  250 9/30  160  198 10/1  93  181 10/2  172  232 10/3  171

## 2012-11-08 NOTE — Telephone Encounter (Signed)
Patient states she is doing better on her medication now.

## 2012-11-08 NOTE — Telephone Encounter (Signed)
Left detailed message on voicemail.  

## 2012-11-08 NOTE — Telephone Encounter (Signed)
Those look a lot better.  I would continue as is for now; have her update me with average/typical sugar reading in about 2-3 weeks.  Thanks.

## 2012-11-13 ENCOUNTER — Ambulatory Visit (INDEPENDENT_AMBULATORY_CARE_PROVIDER_SITE_OTHER): Payer: BC Managed Care – PPO | Admitting: Psychology

## 2012-11-13 DIAGNOSIS — F4323 Adjustment disorder with mixed anxiety and depressed mood: Secondary | ICD-10-CM

## 2012-11-13 DIAGNOSIS — F411 Generalized anxiety disorder: Secondary | ICD-10-CM

## 2012-11-14 ENCOUNTER — Telehealth: Payer: Self-pay

## 2012-11-14 NOTE — Telephone Encounter (Signed)
[place] with [facility] left v/m requesting cb for clarification on exam findings. Left v/m for Liliane Channel to fax request to [phone].

## 2012-11-29 ENCOUNTER — Other Ambulatory Visit: Payer: Self-pay | Admitting: Family Medicine

## 2012-11-29 MED ORDER — PREGABALIN 75 MG PO CAPS: ORAL_CAPSULE | ORAL | Status: DC

## 2012-11-29 NOTE — Telephone Encounter (Signed)
Please fax in.  Thanks.  

## 2012-11-29 NOTE — Telephone Encounter (Signed)
Pt left another note requesting 90 day refill lyrica for neuropathy to [facility].Please advise.

## 2012-11-29 NOTE — Telephone Encounter (Signed)
Faxed

## 2012-11-29 NOTE — Telephone Encounter (Signed)
Medication phoned to pharmacy.  

## 2012-11-29 NOTE — Telephone Encounter (Signed)
Electronic refill request.  Please advise. 

## 2012-11-29 NOTE — Telephone Encounter (Signed)
Pt left note at front desk requesting status of alprazolam 0.25 mg and 1 mg refill to [employer] advise.

## 2012-11-29 NOTE — Telephone Encounter (Signed)
Please call in.  Thanks.   

## 2012-12-04 ENCOUNTER — Ambulatory Visit (INDEPENDENT_AMBULATORY_CARE_PROVIDER_SITE_OTHER): Payer: BC Managed Care – PPO | Admitting: Psychology

## 2012-12-04 DIAGNOSIS — F411 Generalized anxiety disorder: Secondary | ICD-10-CM

## 2012-12-10 ENCOUNTER — Telehealth: Payer: Self-pay | Admitting: Family Medicine

## 2012-12-10 NOTE — Telephone Encounter (Signed)
Elizabeth Rivera dropped off a insurance form to be filled out.

## 2012-12-10 NOTE — Telephone Encounter (Signed)
I'll work on the hard copy.

## 2012-12-13 ENCOUNTER — Other Ambulatory Visit (INDEPENDENT_AMBULATORY_CARE_PROVIDER_SITE_OTHER): Payer: BC Managed Care – PPO

## 2012-12-13 ENCOUNTER — Telehealth: Payer: Self-pay

## 2012-12-13 DIAGNOSIS — E039 Hypothyroidism, unspecified: Secondary | ICD-10-CM

## 2012-12-13 DIAGNOSIS — E119 Type 2 diabetes mellitus without complications: Secondary | ICD-10-CM

## 2012-12-13 LAB — LIPID PANEL
Cholesterol: 176 mg/dL (ref 0–200)
HDL: 38.7 mg/dL — ABNORMAL LOW (ref >39.00)
LDL Cholesterol: 101 mg/dL — ABNORMAL HIGH (ref 0–99)
VLDL: 36.2 mg/dL (ref 0.0–40.0)

## 2012-12-13 LAB — HEMOGLOBIN A1C: Hgb A1c MFr Bld: 8.6 % — ABNORMAL HIGH (ref 4.6–6.5)

## 2012-12-13 MED ORDER — METFORMIN HCL 500 MG PO TABS: ORAL_TABLET | ORAL | Status: DC

## 2012-12-13 NOTE — Telephone Encounter (Signed)
Patient advised.

## 2012-12-13 NOTE — Telephone Encounter (Signed)
Pt left note is out of metformin and pt requesting refill metformin;pt taking 3 tabs daily to [facility] [address] Pt said she is doing OK, AverageFBS has been running 118 and in evening averages 132-148. Refill sent to pharmacy and pt request cb if Dr Damita Dunnings wants to change anything.

## 2012-12-13 NOTE — Telephone Encounter (Signed)
Notify pt.  Lab results-  TSH okay, recheck yearly.  A1c still up but not a lot worse.  Would continue with metformin and recheck A1c in about 3 months.  We may need to adjust meds at that point.   Lipids are okay.   Thanks.  rx sent.

## 2012-12-17 ENCOUNTER — Ambulatory Visit: Payer: BC Managed Care – PPO | Admitting: Psychology

## 2012-12-19 ENCOUNTER — Ambulatory Visit (INDEPENDENT_AMBULATORY_CARE_PROVIDER_SITE_OTHER): Payer: BC Managed Care – PPO | Admitting: Family Medicine

## 2012-12-19 VITALS — BP 112/60 | HR 76 | Temp 97.7°F | Wt 172.2 lb

## 2012-12-19 DIAGNOSIS — E782 Mixed hyperlipidemia: Secondary | ICD-10-CM

## 2012-12-19 DIAGNOSIS — K589 Irritable bowel syndrome without diarrhea: Secondary | ICD-10-CM

## 2012-12-19 DIAGNOSIS — E039 Hypothyroidism, unspecified: Secondary | ICD-10-CM

## 2012-12-19 DIAGNOSIS — F411 Generalized anxiety disorder: Secondary | ICD-10-CM

## 2012-12-19 DIAGNOSIS — E1165 Type 2 diabetes mellitus with hyperglycemia: Secondary | ICD-10-CM

## 2012-12-19 DIAGNOSIS — G589 Mononeuropathy, unspecified: Secondary | ICD-10-CM

## 2012-12-19 DIAGNOSIS — G629 Polyneuropathy, unspecified: Secondary | ICD-10-CM

## 2012-12-19 NOTE — Patient Instructions (Addendum)
Elizabeth Rivera will call about your referral. Don't change your meds for now.   Recheck in about 3 months, labs ahead of time.  30 minute visit.   Check your sugar in the AM before you eat.  Take care.

## 2012-12-19 NOTE — Progress Notes (Signed)
Pre-visit discussion using our clinic review tool. No additional management support is needed unless otherwise documented below in the visit note.  F/u for DM2.  Sugars improved now, 100-125 in AM.  Working on diet and taking metformin w/o ADE.  A1c likely lags her progress.  Labs discussed.    Neuropathy.  Likely from mult sources, possibly including DM2.  She continues to have pain but the lyrica helps some.  See below re: anxiety and driving.  Pain with standing.    Hypothyroid- TSH wnl now.  No ADE on meds.  No neck mass.  Labs d/w pt.   Elevated Cholesterol: Using medications without problems:y Muscle aches: n Diet compliance:y Exercise:limited by pain Other complaints: lipids markedly improved.   Anxiety.  Unable to drive distances now due to anxiety.  Asking about neuro referral closer to home, instead of in [place] due to driving concerns.  Nervous around others, socially anxious.  Still with pervasive worries about health and inability to return to work.  Still in counseling.  Compliant with meds with no ADE.    She has seen Dr. Vira Agar with GI and levsin was added prn for IBS.   PMH and SH reviewed  ROS: See HPI, otherwise noncontributory.  Meds, vitals, and allergies reviewed.   GEN: nad but anxious appearing, alert and oriented HEENT: mucous membranes moist NECK: supple w/o LA, no TMG CV: rrr PULM: ctab, no inc wob ABD: soft, +bs EXT: no edema SKIN: no acute rash

## 2012-12-20 ENCOUNTER — Ambulatory Visit (INDEPENDENT_AMBULATORY_CARE_PROVIDER_SITE_OTHER): Payer: BC Managed Care – PPO | Admitting: Family Medicine

## 2012-12-20 ENCOUNTER — Encounter: Payer: Self-pay | Admitting: Family Medicine

## 2012-12-20 VITALS — BP 142/84 | HR 96 | Temp 98.4°F | Wt 172.2 lb

## 2012-12-20 DIAGNOSIS — R300 Dysuria: Secondary | ICD-10-CM

## 2012-12-20 DIAGNOSIS — N390 Urinary tract infection, site not specified: Secondary | ICD-10-CM

## 2012-12-20 LAB — POCT URINALYSIS DIPSTICK
Bilirubin, UA: NEGATIVE
Glucose, UA: NEGATIVE
Nitrite, UA: POSITIVE
Urobilinogen, UA: NEGATIVE
pH, UA: 6

## 2012-12-20 MED ORDER — CIPROFLOXACIN HCL 250 MG PO TABS: 250.0000 mg | ORAL_TABLET | Freq: Two times a day (BID) | ORAL | Status: DC

## 2012-12-20 NOTE — Assessment & Plan Note (Signed)
a1c likely lags her progress.  Recheck in about 3 months.  She agrees. Continue current med and diet.

## 2012-12-20 NOTE — Assessment & Plan Note (Signed)
Lipids much improved, d/w pt.  Continue current dose of statin.

## 2012-12-20 NOTE — Patient Instructions (Signed)
Drink plenty of water and start the antibiotics today.  We'll contact you with your lab report.  Take care.   

## 2012-12-20 NOTE — Assessment & Plan Note (Signed)
Still profoundly anxious.  Continue current counseling and meds.  Would get neuro referral done before changing her meds again at this point.  She agrees.  >25 min spent with face to face with patient, >50% counseling and/or coordinating care.  I would hold her out of work for another month.  I don't see how she could work now or in the next month with social anxiety and neuropathy pain.

## 2012-12-20 NOTE — Assessment & Plan Note (Signed)
Per GI

## 2012-12-20 NOTE — Progress Notes (Signed)
Pre-visit discussion using our clinic review tool. No additional management support is needed unless otherwise documented below in the visit note.  Dysuria: yes, burning.  duration of symptoms: 3-4 days.  abdominal pain:no fevers:no back pain: sore "but not hurting."  Vomiting: no U/a d/w pt.  ucx pending.   Meds, vitals, and allergies reviewed.   ROS: See HPI.  Otherwise negative.    GEN: nad, alert and oriented HEENT: mucous membranes moist NECK: supple CV: rrr.  PULM: ctab, no inc wob ABD: soft, +bs, suprapubic area not tender EXT: no edema SKIN: no acute rash BACK: no CVA pain

## 2012-12-20 NOTE — Assessment & Plan Note (Signed)
TSH wnl now.  Continue as is.

## 2012-12-20 NOTE — Assessment & Plan Note (Signed)
Refer to neuro.  See above.  Continue neurontin.  Likely from mult sources, with DM2 and hypothyroidism.  I would like neuro input on diagnostic eval.

## 2012-12-22 DIAGNOSIS — N390 Urinary tract infection, site not specified: Secondary | ICD-10-CM | POA: Insufficient documentation

## 2012-12-22 NOTE — Assessment & Plan Note (Signed)
Presumed, start cipro and f/u prn.  Nontoxic. See ucx.

## 2012-12-23 ENCOUNTER — Other Ambulatory Visit: Payer: BC Managed Care – PPO

## 2012-12-23 LAB — URINE CULTURE

## 2012-12-24 ENCOUNTER — Telehealth: Payer: Self-pay

## 2012-12-24 NOTE — Telephone Encounter (Signed)
Waller with [facility] left v/m; Liliane Channel will be faxing request for updated information on pt since last seen. Rick requested cb that this message was received. Left v/m for Liliane Channel to fax requested info as he said in v/m was going to do.

## 2012-12-25 ENCOUNTER — Ambulatory Visit: Payer: BC Managed Care – PPO | Admitting: Psychology

## 2012-12-26 ENCOUNTER — Ambulatory Visit: Payer: BC Managed Care – PPO | Admitting: Family Medicine

## 2013-01-03 ENCOUNTER — Other Ambulatory Visit: Payer: Self-pay | Admitting: *Deleted

## 2013-01-03 NOTE — Telephone Encounter (Signed)
Faxed refill request. [facility] usually requires 90 day Rx.  Please advise.

## 2013-01-04 MED ORDER — ALPRAZOLAM ER 1 MG PO TB24: ORAL_TABLET | ORAL | Status: DC

## 2013-01-04 NOTE — Telephone Encounter (Signed)
Xanax xr printed. Please fax after I sign. The other meds need to come through the originating clinic, likely GI.

## 2013-01-06 ENCOUNTER — Other Ambulatory Visit: Payer: Self-pay | Admitting: *Deleted

## 2013-01-06 NOTE — Telephone Encounter (Signed)
Received faxed refill request from pharmacy. Last lipid panel 12/13/12. Please confirm that patient is to take 2 daily.

## 2013-01-06 NOTE — Telephone Encounter (Signed)
Verify with patient.  I thought she was on 20mg  a day.

## 2013-01-06 NOTE — Telephone Encounter (Signed)
Xanax Rx faxed.  Omeprazole and Reglan denied and faxed back to [facility] to forward to GI MD.

## 2013-01-07 ENCOUNTER — Ambulatory Visit (INDEPENDENT_AMBULATORY_CARE_PROVIDER_SITE_OTHER): Payer: BC Managed Care – PPO | Admitting: Psychology

## 2013-01-07 DIAGNOSIS — F411 Generalized anxiety disorder: Secondary | ICD-10-CM

## 2013-01-07 DIAGNOSIS — F4323 Adjustment disorder with mixed anxiety and depressed mood: Secondary | ICD-10-CM

## 2013-01-07 MED ORDER — ATORVASTATIN CALCIUM 20 MG PO TABS: 20.0000 mg | ORAL_TABLET | Freq: Every day | ORAL | Status: DC

## 2013-01-07 NOTE — Telephone Encounter (Signed)
Patient states that she is taking Atorvastatin 20 mg at night, not 2 daily.

## 2013-01-07 NOTE — Telephone Encounter (Signed)
Thanks. Sent.

## 2013-01-08 ENCOUNTER — Encounter: Payer: Self-pay | Admitting: Neurology

## 2013-01-09 ENCOUNTER — Ambulatory Visit: Payer: Self-pay | Admitting: Family Medicine

## 2013-01-10 ENCOUNTER — Encounter: Payer: Self-pay | Admitting: Family Medicine

## 2013-01-13 ENCOUNTER — Encounter: Payer: Self-pay | Admitting: *Deleted

## 2013-01-13 ENCOUNTER — Other Ambulatory Visit: Payer: Self-pay | Admitting: Family Medicine

## 2013-01-13 NOTE — Telephone Encounter (Signed)
Pharmacy called.  They state they sent [place] a request for Alprazolam .25mg   and we have not responded yet.  They want [place] to call this in [phone]

## 2013-01-14 ENCOUNTER — Other Ambulatory Visit: Payer: Self-pay | Admitting: *Deleted

## 2013-01-14 NOTE — Telephone Encounter (Signed)
Electronic refill request. The Alprazolam 1 mg has been faxed but this is a request for the 0.25 mg.  Please advise.   I will fax when ready.

## 2013-01-15 MED ORDER — ALPRAZOLAM 0.25 MG PO TABS: ORAL_TABLET | ORAL | Status: DC

## 2013-01-15 NOTE — Telephone Encounter (Signed)
Please call in.  Thanks.   

## 2013-01-15 NOTE — Telephone Encounter (Signed)
Tried to return call and message stated that office is closed; hours are 8 am-6 pm. Will have to try again later.

## 2013-01-15 NOTE — Telephone Encounter (Signed)
See following note about calling this in.

## 2013-01-16 ENCOUNTER — Other Ambulatory Visit: Payer: Self-pay | Admitting: *Deleted

## 2013-01-16 MED ORDER — METFORMIN HCL 500 MG PO TABS: ORAL_TABLET | ORAL | Status: DC

## 2013-01-16 MED ORDER — ALPRAZOLAM 0.25 MG PO TABS: ORAL_TABLET | ORAL | Status: DC

## 2013-01-16 NOTE — Telephone Encounter (Signed)
Printed, thanks

## 2013-01-16 NOTE — Telephone Encounter (Signed)
Faxed to [place].

## 2013-01-16 NOTE — Telephone Encounter (Signed)
This is a Psychologist, clinical.  They usually require a 3 months supply.  If you are willing to do that, please print the script and I will fax it.  That is much easier than phoning it in.

## 2013-01-17 ENCOUNTER — Ambulatory Visit (INDEPENDENT_AMBULATORY_CARE_PROVIDER_SITE_OTHER): Payer: BC Managed Care – PPO | Admitting: Family Medicine

## 2013-01-17 ENCOUNTER — Encounter: Payer: Self-pay | Admitting: Family Medicine

## 2013-01-17 VITALS — BP 124/76 | HR 71 | Temp 97.4°F | Ht 61.5 in | Wt 170.5 lb

## 2013-01-17 DIAGNOSIS — Z23 Encounter for immunization: Secondary | ICD-10-CM

## 2013-01-17 DIAGNOSIS — E119 Type 2 diabetes mellitus without complications: Secondary | ICD-10-CM

## 2013-01-17 DIAGNOSIS — G589 Mononeuropathy, unspecified: Secondary | ICD-10-CM

## 2013-01-17 DIAGNOSIS — E1149 Type 2 diabetes mellitus with other diabetic neurological complication: Secondary | ICD-10-CM

## 2013-01-17 DIAGNOSIS — F411 Generalized anxiety disorder: Secondary | ICD-10-CM

## 2013-01-17 MED ORDER — ALPRAZOLAM ER 1 MG PO TB24: ORAL_TABLET | ORAL | Status: DC

## 2013-01-17 NOTE — Assessment & Plan Note (Addendum)
Continue metformin.  Sugar improved recently.  Recheck A1c in ~2/15.  She agrees.  Labs d/w pt.

## 2013-01-17 NOTE — Assessment & Plan Note (Signed)
Continue current meds.  Splinted per neuro.  She continues to have sig hand pain B.

## 2013-01-17 NOTE — Patient Instructions (Addendum)
You were formally diagnosed with diabetes in 2014.   Recheck in early 2/15.  Labs ahead of time.  Use 2mg  of xanax xr each AM.  Use less of the short acting xanax in the meantime.  Let us know when you need a refill.  Take care.

## 2013-01-17 NOTE — Progress Notes (Signed)
Pre-visit discussion using our clinic review tool. No additional management support is needed unless otherwise documented below in the visit note.  She went to neuro. She is in PT.  She has likely CTS.  PT seems to be helping some, but not enough yet.  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  She continues with PT in the meantime.  Continues to have back pain with standing and hand pain with activity. She is wearing her braces nightly.    Discussed her work situation.  She is still out of work.  Still with social phobia, inability to concentrate/multtask.  She can't go to a crowed place, like a movie theater.  She is still in counseling with Dr. Rexene Edison.   She wasn't formally dx'd with DM2 until 09/2012.  Discussed.  Her sugar has been controlled on meds recently, ~120 in the AMs.    Meds, vitals, and allergies reviewed.   ROS: See HPI.  Otherwise, noncontributory.  Not in resp distress but still worried appearing Mmm rrr ctab Midline back ttp, in mid T spine.  abd soft Ext w/o edema Pain with hand grip B Speech fluent.  Judgement appears intact

## 2013-01-17 NOTE — Assessment & Plan Note (Signed)
Continue xanax xr, but inc to 2mg  a day and use the short acting xanax prn, hopefully less.  Okay for outpatient f/u.  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  Continue with counseling.  She agrees.  Out of work until cleared by MD, unable to do any of her routine jobs requirements at this point.  She gave me an envelop to send today's note to the claims department.  Her note is printed and sent.

## 2013-01-24 ENCOUNTER — Other Ambulatory Visit: Payer: Self-pay | Admitting: Family Medicine

## 2013-01-24 NOTE — Telephone Encounter (Signed)
Electronic refill request.  Please advise. 

## 2013-01-25 NOTE — Telephone Encounter (Signed)
please call in. Dose was increased on the xr med. Thanks.

## 2013-01-27 ENCOUNTER — Emergency Department: Payer: Self-pay | Admitting: Emergency Medicine

## 2013-01-27 ENCOUNTER — Telehealth: Payer: Self-pay | Admitting: *Deleted

## 2013-01-27 NOTE — Telephone Encounter (Signed)
Form faxed for diabetic supplies.  This was just done in September but request is for testing once a day.  The directions were to test twice daily back in September.  Please advise. Form is in your In Box.

## 2013-01-27 NOTE — Telephone Encounter (Signed)
Medication phoned to pharmacy.  

## 2013-01-27 NOTE — Telephone Encounter (Signed)
I'll work on the hard copy.

## 2013-01-28 ENCOUNTER — Ambulatory Visit (INDEPENDENT_AMBULATORY_CARE_PROVIDER_SITE_OTHER): Payer: BC Managed Care – PPO | Admitting: Psychology

## 2013-01-28 DIAGNOSIS — F4323 Adjustment disorder with mixed anxiety and depressed mood: Secondary | ICD-10-CM

## 2013-01-28 DIAGNOSIS — F411 Generalized anxiety disorder: Secondary | ICD-10-CM

## 2013-01-28 NOTE — Telephone Encounter (Signed)
Form done, okay to check daily prn.  Thanks.

## 2013-01-28 NOTE — Telephone Encounter (Signed)
Form faxed

## 2013-02-03 ENCOUNTER — Encounter: Payer: Self-pay | Admitting: Internal Medicine

## 2013-02-03 ENCOUNTER — Ambulatory Visit (INDEPENDENT_AMBULATORY_CARE_PROVIDER_SITE_OTHER): Payer: BC Managed Care – PPO | Admitting: Internal Medicine

## 2013-02-03 VITALS — BP 140/82 | HR 79 | Temp 97.9°F | Wt 167.2 lb

## 2013-02-03 DIAGNOSIS — G589 Mononeuropathy, unspecified: Secondary | ICD-10-CM

## 2013-02-03 NOTE — Assessment & Plan Note (Signed)
Continue Lyrica and continue to work with PT

## 2013-02-03 NOTE — Progress Notes (Signed)
Pre-visit discussion using our clinic review tool. No additional management support is needed unless otherwise documented below in the visit note.  

## 2013-02-03 NOTE — Progress Notes (Signed)
Subjective:    Patient ID: Elizabeth Rivera, female    DOB: 05-17-1950, 62 y.o.   MRN: [id number]  HPI  Pt presents to the clinic today with c/o numbness in her feet s/p a fall that occurred 1 week ago.  She was walking in a parking lot and slipped on a cross walk. She was seen in the ER for the same. Xray were done, which did not show any fractures.She has had this numbness in her feet prior to the fall but she reports that is seems so much worse now. She has been evaluated by neurology. She is on Lyrica and working with PT for her neuropathy.  Review of Systems      Past Medical History  Diagnosis Date  . Thyroid disease     hypothyroid  . Anxiety   . Hyperlipidemia   . Hypertension   . Other chest pain     chest discomfort  . Obesity   . Neuropathy   . Other esophagitis     erosive esophagitis  . Other specified gastritis without mention of hemorrhage     erosvie gastritis  . Pain in limb     Right leg pain  . Other specified disorders of adrenal glands 07/2007    adrenal mass via of CT 1.4cm left Adrenal no change(Dr.Cope)   . Other abnormal blood chemistry   . Diverticulosis of colon (without mention of hemorrhage) 05/20/2009    Internal hemms (Dr. Vira Agar)  . Irritable bowel syndrome     history of  . Insomnia, unspecified   . Nervous breakdown 08/2002    [facility]  . GERD (gastroesophageal reflux disease)     reflux HH 09/01/2002//egd reflux Esoph. HH Gastritis nonbleeding erosive gastropathy Duodentitis 08/15/2006  . Hyperglycemia   . Candidal vaginitis   . Forearm fracture     2013, left    Current Outpatient Prescriptions  Medication Sig Dispense Refill  . ALPRAZolam (XANAX XR) 1 MG 24 hr tablet Take 2 tablets (2 mg total) by mouth daily.  60 tablet  1  . ALPRAZolam (XANAX) 0.25 MG tablet TAKE 1 TO 2 TABLETS BY MOUTH THREE TIMES DAILY AS NEEDED FOR ANXIETY  30 tablet  1  . aspirin 81 MG EC tablet Take 81 mg by mouth daily.        Marland Kitchen atorvastatin  (LIPITOR) 20 MG tablet Take 1 tablet (20 mg total) by mouth daily.  90 tablet  3  . BAYER MICROLET LANCETS lancets Ck blood sugar twice a day and as directed. Dx 250.02  200 each  3  . cholecalciferol (VITAMIN D) 1000 UNITS tablet Take 1,000 Units by mouth 2 (two) times daily.       Marland Kitchen gabapentin (NEURONTIN) 100 MG capsule Take 100 mg by mouth 2 (two) times daily.      Marland Kitchen glucose blood (BAYER CONTOUR NEXT TEST) test strip Ck blood sugar twice a day and as directed. Dx 250.02.  200 each  3  . hydrochlorothiazide (HYDRODIURIL) 25 MG tablet Take 1 tablet (25 mg total) by mouth daily. At noon  90 tablet  3  . hyoscyamine (LEVSIN SL) 0.125 MG SL tablet Place 0.125 mg under the tongue every 6 (six) hours as needed.      Marland Kitchen imipramine (TOFRANIL) 25 MG tablet Take 1-2 tablets (25-50 mg total) by mouth at bedtime.  180 tablet  3  . levothyroxine (SYNTHROID, LEVOTHROID) 175 MCG tablet Take 1 tablet (175 mcg total) by mouth daily  before breakfast.  32 tablet  1  . metFORMIN (GLUCOPHAGE) 500 MG tablet Take up to 3 tabs a day  270 tablet  3  . metoCLOPramide (REGLAN) 10 MG tablet Take 10 mg by mouth 3 (three) times daily.       . metoprolol succinate (TOPROL XL) 100 MG 24 hr tablet Take one by mouth twice a day  180 tablet  3  . omeprazole (PRILOSEC) 40 MG capsule Take 40 mg by mouth 3 (three) times daily.       . pregabalin (LYRICA) 75 MG capsule 2 tabs in AM and 1 in PM.  270 capsule  1  . ranitidine (ZANTAC) 300 MG tablet Take 300 mg by mouth 3 (three) times daily.       Marland Kitchen telmisartan-hydrochlorothiazide (MICARDIS HCT) 80-25 MG per tablet Take 1 tablet by mouth daily.  90 tablet  3  . venlafaxine XR (EFFEXOR XR) 150 MG 24 hr capsule One tablet by mouth twice a day  30 capsule  1  . vitamin B-12 (CYANOCOBALAMIN) 1000 MCG tablet Take 1,000 mcg by mouth 2 (two) times daily.         No current facility-administered medications for this visit.    Allergies  Allergen Reactions  . Sulfonamide Derivatives      REACTION: rash  . Sulfa Antibiotics Rash    Family History  Problem Relation Age of Onset  . Hypertension Mother   . Hyperlipidemia Mother   . Stroke Mother     mini strokes  . Vision loss Father     vision problems  . Cancer Father     Colon   . Hyperlipidemia Sister   . Hypertension Sister   . Vision loss Sister     History   Social History  . Marital Status: Married    Spouse Name: N/A    Number of Children: N/A  . Years of Education: N/A   Occupational History  . Not on file.   Social History Main Topics  . Smoking status: Never Smoker   . Smokeless tobacco: Never Used  . Alcohol Use: No  . Drug Use: No  . Sexual Activity: No   Other Topics Concern  . Not on file   Social History Narrative   No regular exercise.   Works at [employer] in [place].       Constitutional: Denies fever, malaise, fatigue, headache or abrupt weight changes.  Musculoskeletal: Denies decrease in range of motion, difficulty with gait, muscle pain or joint pain and swelling.  Neurological: Denies dizziness, difficulty with memory, difficulty with speech or problems with balance and coordination.   No other specific complaints in a complete review of systems (except as listed in HPI above).  Objective:   Physical Exam   BP 140/82  Pulse 79  Temp(Src) 97.9 F (36.6 C) (Oral)  Wt 167 lb 4 oz (75.864 kg)  SpO2 99% Wt Readings from Last 3 Encounters:  02/03/13 167 lb 4 oz (75.864 kg)  01/17/13 170 lb 8 oz (77.338 kg)  12/20/12 172 lb 4 oz (78.132 kg)    General: Appears her stated age, well developed, well nourished in NAD. Cardiovascular: Normal rate and rhythm. S1,S2 noted.  No murmur, rubs or gallops noted. No JVD or BLE edema. No carotid bruits noted. Pulmonary/Chest: Normal effort and positive vesicular breath sounds. No respiratory distress. No wheezes, rales or ronchi noted.  Musculoskeletal: Normal range of motion. No signs of joint swelling. No difficulty with gait.  Neurological: Alert and oriented. Cranial nerves II-XII intact. Coordination normal. +DTRs bilaterally.   BMET    Component Value Date/Time   NA 134* 05/10/2012 1751   K 3.7 05/10/2012 1751   CL 100 05/10/2012 1751   CO2 23 05/10/2012 1751   GLUCOSE 205* 09/17/2012 0822   BUN 12 05/10/2012 1751   CREATININE 0.87 05/10/2012 1751   CREATININE 0.8 07/07/2010 0913   CALCIUM 9.0 05/10/2012 1751   GFRNONAA 66.71 11/02/2009 1110   GFRAA 84 07/15/2007 1048    Lipid Panel     Component Value Date/Time   CHOL 176 12/13/2012 0912   TRIG 181.0* 12/13/2012 0912   HDL 38.70* 12/13/2012 0912   CHOLHDL 5 12/13/2012 0912   VLDL 36.2 12/13/2012 0912   LDLCALC 101* 12/13/2012 0912    CBC    Component Value Date/Time   WBC 5.0 07/07/2010 0913   RBC 4.65 07/07/2010 0913   HGB 13.4 07/07/2010 0913   HCT 39.9 07/07/2010 0913   PLT 216.0 07/07/2010 0913   MCV 85.9 07/07/2010 0913   MCHC 33.6 07/07/2010 0913   RDW 14.2 07/07/2010 0913   LYMPHSABS 1.4 07/07/2010 0913   MONOABS 0.4 07/07/2010 0913   EOSABS 0.2 07/07/2010 0913   BASOSABS 0.0 07/07/2010 0913    Hgb A1C Lab Results  Component Value Date   HGBA1C 8.6* 12/13/2012        Assessment & Plan:

## 2013-02-03 NOTE — Patient Instructions (Signed)

## 2013-02-04 ENCOUNTER — Telehealth: Payer: Self-pay

## 2013-02-04 NOTE — Telephone Encounter (Signed)
Corrected copy faxed to [phone] and sent to be scanned. Hollis notified done.

## 2013-02-04 NOTE — Telephone Encounter (Signed)
Christine with [facility] left v/m requesting clarification on recent faxed form to [facility] on questions 1 & 2. Is pt taking insulin? Question 1 said no and question 2 said 1 insulin injection daily. Copy of form in Dr Josefine Class in box.Please advise. Respond to [phone] with ref # [id number].

## 2013-02-04 NOTE — Telephone Encounter (Signed)
Corrected the form.  Misread on my part.  I thought that was asking how many checks per day, not injections.  Thanks.

## 2013-02-06 ENCOUNTER — Encounter: Payer: Self-pay | Admitting: Neurology

## 2013-02-18 ENCOUNTER — Ambulatory Visit (INDEPENDENT_AMBULATORY_CARE_PROVIDER_SITE_OTHER): Payer: BC Managed Care – PPO | Admitting: Psychology

## 2013-02-18 DIAGNOSIS — F411 Generalized anxiety disorder: Secondary | ICD-10-CM

## 2013-02-18 DIAGNOSIS — F4323 Adjustment disorder with mixed anxiety and depressed mood: Secondary | ICD-10-CM

## 2013-02-28 ENCOUNTER — Ambulatory Visit (INDEPENDENT_AMBULATORY_CARE_PROVIDER_SITE_OTHER): Payer: BC Managed Care – PPO | Admitting: Family Medicine

## 2013-02-28 ENCOUNTER — Encounter: Payer: Self-pay | Admitting: Family Medicine

## 2013-02-28 VITALS — BP 142/88 | HR 88 | Temp 97.6°F | Wt 167.2 lb

## 2013-02-28 DIAGNOSIS — F411 Generalized anxiety disorder: Secondary | ICD-10-CM

## 2013-02-28 DIAGNOSIS — G589 Mononeuropathy, unspecified: Secondary | ICD-10-CM

## 2013-02-28 DIAGNOSIS — E1149 Type 2 diabetes mellitus with other diabetic neurological complication: Secondary | ICD-10-CM

## 2013-02-28 NOTE — Progress Notes (Signed)
Pre-visit discussion using our clinic review tool. No additional management support is needed unless otherwise documented below in the visit note.  "I'm doing, but not great."  She filed for social security in the meantime.  We had increased her xanax in the meantime and that helped a little.  She can still get so anxious that she'll lose her train of thought and forget items/appointments (ie a doctor's appointment).  She isn't sedated from meds.  She has to take the short acting xanax less often now, ie BID. She still has a sig public phobia, trouble with stores.  She is still in counseling.   She continues to have neuropathy pain in her legs.  Compliant with meds. She is going for a second opinion, this is pending.  We agreed not to change her meds until she can see neuro for second opinion. She has been going through PT for neuropathy and that was helping with balance.  She is using a cane now.  She wants to continues with PT, but this is on hold for now until she can get the second opinion done.   DM2.  She has intentional weight loss with diet and exercise. She'll come back next month for f/u A1c.  Sugar ~100 in the AM.   Meds, vitals, and allergies reviewed.   ROS: See HPI.  Otherwise, noncontributory.  nad but worried appearing, she tries to calm herself in the meantime.   Mmm rrr ctab abd soft, not ttp Ext w/o edema

## 2013-02-28 NOTE — Patient Instructions (Signed)
Keep the neuro appointment and recheck A1c next month.  We'll go from there. Take care.

## 2013-03-02 NOTE — Assessment & Plan Note (Signed)
I'll await neuro f/u.

## 2013-03-02 NOTE — Assessment & Plan Note (Signed)
D/u labs pending.

## 2013-03-02 NOTE — Assessment & Plan Note (Signed)
She is improved from her initial point, but she wouldn't be able to work at this point.  Would continue current meds for now, no sedation on meds and I'll await her neuro f/u.  She agrees.

## 2013-03-04 ENCOUNTER — Ambulatory Visit (INDEPENDENT_AMBULATORY_CARE_PROVIDER_SITE_OTHER): Payer: BC Managed Care – PPO | Admitting: Psychology

## 2013-03-04 ENCOUNTER — Telehealth: Payer: Self-pay

## 2013-03-04 DIAGNOSIS — F411 Generalized anxiety disorder: Secondary | ICD-10-CM

## 2013-03-04 DIAGNOSIS — F4323 Adjustment disorder with mixed anxiety and depressed mood: Secondary | ICD-10-CM

## 2013-03-04 NOTE — Telephone Encounter (Signed)
Relevant patient education assigned to patient using Emmi. ° °

## 2013-03-09 ENCOUNTER — Encounter: Payer: Self-pay | Admitting: Neurology

## 2013-03-12 ENCOUNTER — Ambulatory Visit (INDEPENDENT_AMBULATORY_CARE_PROVIDER_SITE_OTHER): Payer: BC Managed Care – PPO | Admitting: Neurology

## 2013-03-12 ENCOUNTER — Telehealth: Payer: Self-pay | Admitting: Neurology

## 2013-03-12 ENCOUNTER — Encounter: Payer: Self-pay | Admitting: Neurology

## 2013-03-12 ENCOUNTER — Ambulatory Visit (INDEPENDENT_AMBULATORY_CARE_PROVIDER_SITE_OTHER): Payer: BC Managed Care – PPO | Admitting: Psychology

## 2013-03-12 VITALS — BP 118/68 | HR 70 | Temp 97.0°F | Resp 18 | Ht 63.0 in | Wt 168.4 lb

## 2013-03-12 DIAGNOSIS — E1149 Type 2 diabetes mellitus with other diabetic neurological complication: Secondary | ICD-10-CM

## 2013-03-12 DIAGNOSIS — G609 Hereditary and idiopathic neuropathy, unspecified: Secondary | ICD-10-CM

## 2013-03-12 DIAGNOSIS — F4323 Adjustment disorder with mixed anxiety and depressed mood: Secondary | ICD-10-CM

## 2013-03-12 DIAGNOSIS — F411 Generalized anxiety disorder: Secondary | ICD-10-CM

## 2013-03-12 MED ORDER — GABAPENTIN 100 MG PO CAPS: 100.0000 mg | ORAL_CAPSULE | Freq: Three times a day (TID) | ORAL | Status: DC

## 2013-03-12 NOTE — Patient Instructions (Signed)
1.  Start taking gabapentin, 1 tablet three times a day.  Call in 4 weeks with update. 2.  We will check more blood work looking for causes of neuropathy 3.  Continue physical therapy 4.  Follow up in 3 months.

## 2013-03-12 NOTE — Telephone Encounter (Signed)
At check out, pt mentioned she was nearly out of Neurontin. Dr. Tomi Likens has increased her dose. She will need new script. If this has not already been done, please send updated script to [facility] on [address] / [address]

## 2013-03-12 NOTE — Telephone Encounter (Signed)
Pharmacy phone number if needed is [phone] / [address]

## 2013-03-12 NOTE — Progress Notes (Signed)
NEUROLOGY CONSULTATION NOTE  Elizabeth Rivera MRN: [id number] DOB: 15-Oct-1950  Referring provider: Dr. Damita Dunnings Primary care provider: Dr. Damita Dunnings  Reason for consult:  neuropathy  HISTORY OF PRESENT ILLNESS: Elizabeth Rivera is a 63 year old right-handed woman with history of anxiety, diabetes type II, hypothyroidism, HTN and HLD who presents with longstanding history of neuropathy.  Symptoms have been present for about 10 years.  She describes numbness of the hands, fingers, forearms, toes, feet and legs.  She also notes aching pain in the legs.  She also notes burning pain on the soles of the feet and toes.  She experiences allodynia, where the bed sheet causes pain if over her feet.  She denies shooting pain down the legs.  She has aching pain in the knees as well.  She says that the physical therapist told her that her left hip is a little weaker than the right side.  She reports that she needs to drink several cups of water in order to urinate.  Otherwise, no bladder or bowel dysfunction.  Over the past year and a half, she has had increased gait difficulty and has had falls.  Because of the pain, she can no longer work at [employer], where she has worked for 29 years.  She was diagnosed with diabetes mellitus type II this past summer.  Her Hgb A1c was 8.4.  She recently saw a neurologist in [place], Dr. Melrose Nakayama, who performed a NCV-EMG, which reportedly revealed peripheral neuropathy.  She was sent to PT and given wrist splints to wear at night.  The PT has been helpful.  She says the wrist splints help the numbness in the hands and fingers.  She also has a longstanding history of hypothyroidism that was only recently controlled.  She was started on gabapentin 164m twice daily, which helps.  PAST MEDICAL HISTORY: Past Medical History  Diagnosis Date  . Thyroid disease     hypothyroid  . Anxiety   . Hyperlipidemia   . Hypertension   . Other chest pain     chest discomfort  . Obesity   .  Neuropathy   . Other esophagitis     erosive esophagitis  . Other specified gastritis without mention of hemorrhage     erosvie gastritis  . Pain in limb     Right leg pain  . Other specified disorders of adrenal glands 07/2007    adrenal mass via of CT 1.4cm left Adrenal no change(Dr.Cope)   . Other abnormal blood chemistry   . Diverticulosis of colon (without mention of hemorrhage) 05/20/2009    Internal hemms (Dr. EVira Agar  . Irritable bowel syndrome     history of  . Insomnia, unspecified   . [facility] ([facility]astro[facility] disease)     reflux HH 09/01/2002//egd reflux Esoph. HH Gastritis nonbleeding erosive gastropathy Duodentitis 08/15/2006  . Hyperglycemia   . Candidal vaginitis   . Forearm fracture     2013, left    PAST SURGICAL HISTORY: Past Surgical History  Procedure Laterality Date  . Cholecystectomy  03/2004  . Abdominal hysterectomy  1995    Part Hyst BSO DUB ovarian cyst B9  . Vaginal delivery  1977  . Breast reduction surgery  1984  . Cystoscopy  01/08/2008    Former site of inflammation healed (Dr. CJacqlyn Larsen  . Nsvd x1  1977  . Esophagogastroduodenoscopy  1. 09/01/02  2. 08/15/06    1. Reflux, HH  2. Reflux esoph HH gaastritis nonbleeding erosive gastropathy duodenitis  . Colonoscopy  1. 08/15/06  2. 05/20/09    Int Hemms  2. Divertics Int Hemms (Dr. Vira Agar)  . Ct abd w & pelvis wo cm  6/09    CT Scan abd stable adrenal adenoma 1.4cm left adrenal no change (Dr. Jacqlyn Larsen)    MEDICATIONS: Current Outpatient Prescriptions on File Prior to Visit  Medication Sig Dispense Refill  . ALPRAZolam (XANAX XR) 1 MG 24 hr tablet Take 2 tablets (2 mg total) by mouth daily.  60 tablet  1  . ALPRAZolam (XANAX) 0.25 MG tablet TAKE 1 TO 2 TABLETS BY MOUTH THREE TIMES DAILY AS NEEDED FOR ANXIETY  30 tablet  1  . aspirin 81 MG EC tablet Take 81 mg by mouth daily.        Marland Kitchen atorvastatin (LIPITOR) 20 MG tablet Take 1 tablet (20 mg total) by  mouth daily.  90 tablet  3  . BAYER MICROLET LANCETS lancets Ck blood sugar twice a day and as directed. Dx 250.02  200 each  3  . cholecalciferol (VITAMIN D) 1000 UNITS tablet Take 1,000 Units by mouth 2 (two) times daily.       Marland Kitchen glucose blood (BAYER CONTOUR NEXT TEST) test strip Ck blood sugar twice a day and as directed. Dx 250.02.  200 each  3  . hydrochlorothiazide (HYDRODIURIL) 25 MG tablet Take 1 tablet (25 mg total) by mouth daily. At noon  90 tablet  3  . hyoscyamine (LEVSIN SL) 0.125 MG SL tablet Place 0.125 mg under the tongue every 6 (six) hours as needed.      Marland Kitchen imipramine (TOFRANIL) 25 MG tablet Take 1-2 tablets (25-50 mg total) by mouth at bedtime.  180 tablet  3  . levothyroxine (SYNTHROID, LEVOTHROID) 175 MCG tablet Take 1 tablet (175 mcg total) by mouth daily before breakfast.  32 tablet  1  . meloxicam (MOBIC) 15 MG tablet Take 15 mg by mouth daily.      . metFORMIN (GLUCOPHAGE) 500 MG tablet Take up to 3 tabs a day  270 tablet  3  . metoCLOPramide (REGLAN) 10 MG tablet Take 10 mg by mouth 3 (three) times daily.       . metoprolol succinate (TOPROL XL) 100 MG 24 hr tablet Take one by mouth twice a day  180 tablet  3  . omeprazole (PRILOSEC) 40 MG capsule Take 40 mg by mouth 3 (three) times daily.       . pregabalin (LYRICA) 75 MG capsule 2 tabs in AM and 1 in PM.  270 capsule  1  . ranitidine (ZANTAC) 300 MG tablet Take 300 mg by mouth 3 (three) times daily.       Marland Kitchen telmisartan-hydrochlorothiazide (MICARDIS HCT) 80-25 MG per tablet Take 1 tablet by mouth daily.  90 tablet  3  . venlafaxine XR (EFFEXOR XR) 150 MG 24 hr capsule One tablet by mouth twice a day  30 capsule  1  . vitamin B-12 (CYANOCOBALAMIN) 1000 MCG tablet Take 1,000 mcg by mouth 2 (two) times daily.         No current facility-administered medications on file prior to visit.    ALLERGIES: Allergies  Allergen Reactions  . Sulfonamide Derivatives     REACTION: rash  . Sulfa Antibiotics Rash    FAMILY  HISTORY: Family History  Problem Relation Age of Onset  . Hypertension Mother   . Hyperlipidemia Mother   . Stroke Mother  mini strokes  . Vision loss Father     vision problems  . Cancer Father     Colon   . Hyperlipidemia Sister   . Hypertension Sister   . Vision loss Sister     SOCIAL HISTORY: History   Social History  . Marital Status: Married    Spouse Name: N/A    Number of Children: N/A  . Years of Education: N/A   Occupational History  . Not on file.   Social History Main Topics  . Smoking status: Never Smoker   . Smokeless tobacco: Never Used  . Alcohol Use: No  . Drug Use: No  . Sexual Activity: No   Other Topics Concern  . Not on file   Social History Narrative   No regular exercise.   Works at [employer] in [place]: Constitutional: No fevers, chills, or sweats, no generalized fatigue, change in appetite Eyes: No visual changes, double vision, eye pain Ear, nose and throat: No hearing loss, ear pain, nasal congestion, sore throat Cardiovascular: No chest pain, palpitations Respiratory:  No shortness of breath at rest or with exertion, wheezes GastrointestinaI: No nausea, vomiting, diarrhea, abdominal pain, fecal incontinence Genitourinary:  No dysuria, urinary retention or frequency Musculoskeletal:  No neck pain, back pain Integumentary: No rash, pruritus, skin lesions Neurological: as above Psychiatric: No depression, insomnia, anxiety Endocrine: No palpitations, fatigue, diaphoresis, mood swings, change in appetite, change in weight, increased thirst Hematologic/Lymphatic:  No anemia, purpura, petechiae. Allergic/Immunologic: no itchy/runny eyes, nasal congestion, recent allergic reactions, rashes  PHYSICAL EXAM: Filed Vitals:   03/12/13 1356  BP: 118/68  Pulse: 70  Temp: 97 F (36.1 C)  Resp: 18   General: No acute distress Head:  Normocephalic/atraumatic Neck: supple, no paraspinal tenderness, full range of  motion Back: No paraspinal tenderness Heart: regular rate and rhythm Lungs: Clear to auscultation bilaterally. Vascular: No carotid bruits. Neurological Exam: Mental status: alert and oriented to person, place, and time, speech fluent and not dysarthric, language intact. Cranial nerves: CN I: not tested CN II: pupils equal, round and reactive to light, visual fields intact, fundi unremarkable. CN III, IV, VI:  full range of motion, no nystagmus, no ptosis CN V: facial sensation intact CN VII: upper and lower face symmetric CN VIII: hearing intact CN IX, X: gag intact, uvula midline CN XI: sternocleidomastoid and trapezius muscles intact CN XII: tongue midline Bulk & Tone: normal, no fasciculations. Motor: 5/5 throughout Sensation: endorses reduced pinprick sensation in the toes up to above the knees bilaterally, in a distal-proximal gradient.  She also endorses reduced pinprick sensation in both hands up to the shoulders, as well as the abdomen.  Endorses reduced vibration in the lower extremities.  Deep Tendon Reflexes: 2+ throughout except absent in the ankles.  Toes down. Finger to nose testing: no dysmetria Heel to shin: no dysmetria Gait: wide-based, unsteady.  Unable to walk in tandem. Romberg with mild sway.  IMPRESSION: Peripheral neuropathy.  Most common form is idiopathic.  It could be secondary to diabetes or hypothyroidism as well.  Even though she was only recently diagnosed with diabetes, she could have had impaired fasting glucose for many years.  The subjective reduced pinprick sensation of the abdomen and upper arms and legs are unusual, however she does not exhibit any objective abnormalities on exam that would suggest a CNS etiology.  PLAN: 1.  Increase gabapentin to 163m three times daily.  To call in 4 weeks  with update. 2.  Will check blood for SPEP/IFE, ESR, ANA 3.  Continue PT and use of cane 4.  Will obtain notes and EMG report from Dr. Lannie Fields office 5.    Follow up in 3 months.  Thank you for allowing me to take part in the care of this patient.  Metta Clines, DO  CC:  Elsie Stain, MD

## 2013-03-13 ENCOUNTER — Other Ambulatory Visit (INDEPENDENT_AMBULATORY_CARE_PROVIDER_SITE_OTHER): Payer: BC Managed Care – PPO

## 2013-03-13 DIAGNOSIS — E119 Type 2 diabetes mellitus without complications: Secondary | ICD-10-CM

## 2013-03-13 LAB — HEMOGLOBIN A1C: HEMOGLOBIN A1C: 6.1 % (ref 4.6–6.5)

## 2013-03-14 ENCOUNTER — Ambulatory Visit: Payer: Self-pay | Admitting: Specialist

## 2013-03-17 LAB — PROTEIN ELECTROPHORESIS, SERUM
ALBUMIN ELP: 50.8 % — AB (ref 55.8–66.1)
ALPHA-1-GLOBULIN: 3.5 % (ref 2.9–4.9)
Alpha-2-Globulin: 10.2 % (ref 7.1–11.8)
Beta 2: 8 % — ABNORMAL HIGH (ref 3.2–6.5)
Beta Globulin: 6.3 % (ref 4.7–7.2)
Gamma Globulin: 21.2 % — ABNORMAL HIGH (ref 11.1–18.8)
TOTAL PROTEIN, SERUM ELECTROPHOR: 7.5 g/dL (ref 6.0–8.3)

## 2013-03-18 ENCOUNTER — Ambulatory Visit (INDEPENDENT_AMBULATORY_CARE_PROVIDER_SITE_OTHER): Payer: BC Managed Care – PPO | Admitting: Family Medicine

## 2013-03-18 ENCOUNTER — Encounter: Payer: Self-pay | Admitting: Family Medicine

## 2013-03-18 VITALS — BP 102/68 | HR 96 | Temp 97.5°F | Wt 161.0 lb

## 2013-03-18 DIAGNOSIS — R197 Diarrhea, unspecified: Secondary | ICD-10-CM

## 2013-03-18 LAB — BASIC METABOLIC PANEL
BUN: 19 mg/dL (ref 6–23)
CHLORIDE: 101 meq/L (ref 96–112)
CO2: 24 meq/L (ref 19–32)
Calcium: 10 mg/dL (ref 8.4–10.5)
Creatinine, Ser: 1.1 mg/dL (ref 0.4–1.2)
GFR: 52.55 mL/min — ABNORMAL LOW (ref >60.00)
Glucose, Bld: 116 mg/dL — ABNORMAL HIGH (ref 70–99)
POTASSIUM: 3.8 meq/L (ref 3.5–5.1)
Sodium: 135 mEq/L (ref 135–145)

## 2013-03-18 NOTE — Patient Instructions (Addendum)
Go to the lab on the way out.  We'll contact you with your lab report.  Stop the micardis and HCTZ for now.  Drink plenty of fluids in the meantime, as much as tolerated.  Sipping small amounts frequently will still help.  Stop the mobic for now.  Take imodium as needed, up to 4 times a day.  Avoid dairy for now.  Get some rest.  Take care.  If your BP is elevated after you are feeling better, then we can add back some of your blood pressure medicine.

## 2013-03-18 NOTE — Assessment & Plan Note (Signed)
Nontoxic, okay for outpatient f/u.  Likely mildly dehydrated. Check bmet today.  See instructions re: meds.   Discussed frequent hand washing.   F/u prn. She should improve, this is likely past the peak of the diarrhea.

## 2013-03-18 NOTE — Progress Notes (Signed)
Pre-visit discussion using our clinic review tool. No additional management support is needed unless otherwise documented below in the visit note.  Sx started about 4 days ago.  Profuse diarrhea.  Weight down 7 lbs in the meantime.  No vomiting.  Dec in appetite.  Still able to drink fluids.  Frequent belching.  Had been lightheaded, less so yesterday and today.  Sugar was 127 this AM.  Mouth is dry recently.  No blood in stool now. She had a possible episode with some blood in stool but this is was isolated.  No bruising.    Meds, vitals, and allergies reviewed.   ROS: See HPI.  Otherwise, noncontributory.  Tired appearing but in NAD Lips slightly dry.  rrr ctab abd soft, not ttp Ext w/o edema Normal skin turgor.  Rapid cap refill

## 2013-03-19 ENCOUNTER — Ambulatory Visit: Payer: BC Managed Care – PPO | Admitting: Psychology

## 2013-03-20 ENCOUNTER — Other Ambulatory Visit: Payer: Self-pay | Admitting: *Deleted

## 2013-03-20 ENCOUNTER — Other Ambulatory Visit: Payer: Self-pay | Admitting: Neurology

## 2013-03-21 LAB — SEDIMENTATION RATE: SED RATE: 21 mm/h (ref 0–22)

## 2013-03-21 LAB — ANA: Anti Nuclear Antibody(ANA): NEGATIVE

## 2013-03-24 LAB — SPEP & IFE WITH QIG
Albumin ELP: 54.7 % — ABNORMAL LOW (ref 55.8–66.1)
Alpha-1-Globulin: 3.4 % (ref 2.9–4.9)
Alpha-2-Globulin: 9.2 % (ref 7.1–11.8)
Beta 2: 5.3 % (ref 3.2–6.5)
Beta Globulin: 6.5 % (ref 4.7–7.2)
Gamma Globulin: 20.9 % — ABNORMAL HIGH (ref 11.1–18.8)
IGG (IMMUNOGLOBIN G), SERUM: 1330 mg/dL (ref 690–1700)
IgA: 526 mg/dL — ABNORMAL HIGH (ref 69–380)
IgM, Serum: 117 mg/dL (ref 52–322)
Total Protein, Serum Electrophoresis: 7.4 g/dL (ref 6.0–8.3)

## 2013-03-25 ENCOUNTER — Ambulatory Visit: Payer: BC Managed Care – PPO | Admitting: Psychology

## 2013-03-27 ENCOUNTER — Telehealth: Payer: Self-pay | Admitting: *Deleted

## 2013-03-27 NOTE — Telephone Encounter (Signed)
Message copied by Claudie Revering on Thu Mar 27, 2013 12:31 PM ------      Message from: JAFFE, ADAM R      Created: Thu Mar 27, 2013  9:50 AM       Blood work tested is unremarkable.       ----- Message -----         From: [facility] Interface         Sent: 03/17/2013  12:42 PM           To: Dudley Major, DO                   ------

## 2013-04-03 ENCOUNTER — Ambulatory Visit: Payer: BC Managed Care – PPO | Admitting: Family Medicine

## 2013-04-06 ENCOUNTER — Encounter: Payer: Self-pay | Admitting: Neurology

## 2013-04-15 ENCOUNTER — Telehealth: Payer: Self-pay

## 2013-04-15 NOTE — Telephone Encounter (Signed)
Pt just left Dr Aurora Mask office and BP 171/107 P 102; pt said no irregular heart beat, pt having h/a, eyes hurt with blurred vision; h/a and vision problems started 04/12/13. No dizziness,CP,SOB,speech problems or extremity weakness. Pt said BP med was stopped mid Feb due to pts BP being low. Pt has not checked BP since BP med stopped. Pt still has micardis and metoprolol at home.Please advise.

## 2013-04-15 NOTE — Telephone Encounter (Signed)
She was to continue the metoprolol.  I restart that and then get her in this week for a BP recheck.  Have her check BP at home.  Thanks.

## 2013-04-15 NOTE — Telephone Encounter (Signed)
Advised pt as instructed; pt will restart metoprolol and take home BPs but pt wants to be seen in our office in the morning. Pt scheduled appt with Dr Glori Bickers 04/16/13 at 11:45 AM. Liana Gerold pt appt with Dr Damita Dunnings tomorrow afternoon but pt did not want to wait until afternoon to be seen. If pts condition where to change or worsen like CP and SOB pt will go to UC if needed.

## 2013-04-15 NOTE — Telephone Encounter (Signed)
I will see her then  

## 2013-04-16 ENCOUNTER — Encounter: Payer: Self-pay | Admitting: Family Medicine

## 2013-04-16 ENCOUNTER — Ambulatory Visit (INDEPENDENT_AMBULATORY_CARE_PROVIDER_SITE_OTHER): Payer: BC Managed Care – PPO | Admitting: Family Medicine

## 2013-04-16 ENCOUNTER — Telehealth: Payer: Self-pay | Admitting: Family Medicine

## 2013-04-16 VITALS — BP 140/86 | HR 67 | Temp 97.9°F | Ht 63.0 in | Wt 166.2 lb

## 2013-04-16 DIAGNOSIS — I10 Essential (primary) hypertension: Secondary | ICD-10-CM

## 2013-04-16 MED ORDER — METOPROLOL SUCCINATE ER 100 MG PO TB24: ORAL_TABLET | ORAL | Status: DC

## 2013-04-16 NOTE — Telephone Encounter (Signed)
Note, thanks.

## 2013-04-16 NOTE — Progress Notes (Signed)
Subjective:    Patient ID: Elizabeth Rivera, female    DOB: 04-Sep-1950, 63 y.o.   MRN: [id number]  HPI Here for elevated bp  Dr Josefine Class pt  A while back-she was taken off bp med for hypotension  Recent trip to GI 171/107 bp  Noted she had a headache and blurry vision for a while- that is improved now   She does suffer from anxiety from stress She sees Dr Rexene Edison -that is helpful  Thinks this made bp go up  [place] work since may and just filed for disability   In fact she has anxiety since childhood and had " a complete nervous breakdown" in the past  No SI at all   Called here-told to re start her metoprolol  Took one yesterday and one today   Much better today BP: 140/86 mmHg     Wt is up 5 lb with bmi of 29  Patient Active Problem List   Diagnosis Date Noted  . Diarrhea 03/18/2013  . UTI (urinary tract infection) 12/22/2012  . Type II or unspecified type diabetes mellitus with neurological manifestations, not stated as uncontrolled(250.60) 09/27/2012  . Bilateral leg pain 07/02/2012  . Urinary incontinence 09/25/2011  . Diaphoresis 12/21/2010  . Leg edema 05/11/2010  . B12 deficiency 05/11/2010  . BENIGN POSITIONAL VERTIGO 01/06/2009  . ABDOMINAL PAIN, LEFT UPPER QUADRANT 12/10/2008  . UNSPECIFIED VITAMIN D DEFICIENCY 09/07/2008  . ROSACEA 09/07/2008  . NEUROPATHY 08/24/2008  . Other chest pain 05/19/2008  . EROSIVE ESOPHAGITIS 02/03/2008  . EROSIVE GASTRITIS 02/03/2008  . ANXIETY 02/14/2007  . OBESITY 11/15/2006  . Extremity pain 09/28/2006  . ADRENAL MASS VIA CT 07/07/2006  . HYPOTHYROIDISM 07/05/2006  . HYPERLIPIDEMIA, MIXED 07/05/2006  . HYPERTENSION 07/05/2006  . GERD 07/05/2006  . DIVERTICULOSIS, COLON 07/05/2006  . IBS 07/05/2006  . INSOMNIA 07/05/2006   Past Medical History  Diagnosis Date  . Thyroid disease     hypothyroid  . Anxiety   . Hyperlipidemia   . Hypertension   . Other chest pain     chest discomfort  . Obesity   .  Neuropathy   . Other esophagitis     erosive esophagitis  . Other specified gastritis without mention of hemorrhage     erosvie gastritis  . Pain in limb     Right leg pain  . Other specified disorders of adrenal glands 07/2007    adrenal mass via of CT 1.4cm left Adrenal no change(Dr.Cope)   . Other abnormal blood chemistry   . Diverticulosis of colon (without mention of hemorrhage) 05/20/2009    Internal hemms (Dr. Vira Agar)  . Irritable bowel syndrome     history of  . Insomnia, unspecified   . Nervous breakdown 08/2002    [place] [facility]  . GERD (gastroesophageal reflux disease)     reflux HH 09/01/2002//egd reflux Esoph. HH Gastritis nonbleeding erosive gastropathy Duodentitis 08/15/2006  . Hyperglycemia   . Candidal vaginitis   . Forearm fracture     2013, left   Past Surgical History  Procedure Laterality Date  . Cholecystectomy  03/2004  . Abdominal hysterectomy  1995    Part Hyst BSO DUB ovarian cyst B9  . Vaginal delivery  1977  . Breast reduction surgery  1984  . Cystoscopy  01/08/2008    Former site of inflammation healed (Dr. Jacqlyn Larsen)  . Nsvd x1  1977  . Esophagogastroduodenoscopy  1. 09/01/02  2. 08/15/06    1. Reflux, HH  2. Reflux  esoph HH gaastritis nonbleeding erosive gastropathy duodenitis  . Colonoscopy  1. 08/15/06  2. 05/20/09    Int Hemms  2. Divertics Int Hemms (Dr. Vira Agar)  . Ct abd w & pelvis wo cm  6/09    CT Scan abd stable adrenal adenoma 1.4cm left adrenal no change (Dr. Jacqlyn Larsen)   History  Substance Use Topics  . Smoking status: Never Smoker   . Smokeless tobacco: Never Used  . Alcohol Use: No   Family History  Problem Relation Age of Onset  . Hypertension Mother   . Hyperlipidemia Mother   . Stroke Mother     mini strokes  . Vision loss Father     vision problems  . Cancer Father     Colon   . Hyperlipidemia Sister   . Hypertension Sister   . Vision loss Sister    Allergies  Allergen Reactions  . Sulfonamide Derivatives     REACTION:  rash  . Sulfa Antibiotics Rash   Current Outpatient Prescriptions on File Prior to Visit  Medication Sig Dispense Refill  . ALPRAZolam (XANAX XR) 1 MG 24 hr tablet Take 2 tablets (2 mg total) by mouth daily.  60 tablet  1  . ALPRAZolam (XANAX) 0.25 MG tablet TAKE 1 TO 2 TABLETS BY MOUTH THREE TIMES DAILY AS NEEDED FOR ANXIETY  30 tablet  1  . aspirin 81 MG EC tablet Take 81 mg by mouth daily.        Marland Kitchen atorvastatin (LIPITOR) 20 MG tablet Take 1 tablet (20 mg total) by mouth daily.  90 tablet  3  . BAYER MICROLET LANCETS lancets Ck blood sugar twice a day and as directed. Dx 250.02  200 each  3  . cholecalciferol (VITAMIN D) 1000 UNITS tablet Take 1,000 Units by mouth 2 (two) times daily.       Marland Kitchen gabapentin (NEURONTIN) 100 MG capsule Take 1 capsule (100 mg total) by mouth 3 (three) times daily.  90 capsule  6  . glucose blood (BAYER CONTOUR NEXT TEST) test strip Ck blood sugar twice a day and as directed. Dx 250.02.  200 each  3  . hyoscyamine (LEVSIN SL) 0.125 MG SL tablet Place 0.125 mg under the tongue every 6 (six) hours as needed.      Marland Kitchen imipramine (TOFRANIL) 25 MG tablet Take 1-2 tablets (25-50 mg total) by mouth at bedtime.  180 tablet  3  . levothyroxine (SYNTHROID, LEVOTHROID) 175 MCG tablet Take 1 tablet (175 mcg total) by mouth daily before breakfast.  32 tablet  1  . metFORMIN (GLUCOPHAGE) 500 MG tablet Take up to 3 tabs a day  270 tablet  3  . metoCLOPramide (REGLAN) 10 MG tablet Take 10 mg by mouth 3 (three) times daily.       . metoprolol succinate (TOPROL XL) 100 MG 24 hr tablet Take one by mouth twice a day  180 tablet  3  . omeprazole (PRILOSEC) 40 MG capsule Take 40 mg by mouth 3 (three) times daily.       . pregabalin (LYRICA) 75 MG capsule 2 tabs in AM and 1 in PM.  270 capsule  1  . ranitidine (ZANTAC) 300 MG tablet Take 300 mg by mouth 3 (three) times daily.       Marland Kitchen venlafaxine XR (EFFEXOR XR) 150 MG 24 hr capsule One tablet by mouth twice a day  30 capsule  1  . vitamin  B-12 (CYANOCOBALAMIN) 1000 MCG tablet Take 1,000 mcg by  mouth 2 (two) times daily.         No current facility-administered medications on file prior to visit.    Review of Systems Review of Systems  Constitutional: Negative for fever, appetite change, fatigue and unexpected weight change.  Eyes: Negative for pain and visual disturbance.  Respiratory: Negative for cough and shortness of breath.   Cardiovascular: Negative for cp or palpitations    Gastrointestinal: Negative for nausea, diarrhea and constipation.  Genitourinary: Negative for urgency and frequency.  Skin: Negative for pallor or rash   Neurological: Negative for weakness, light-headedness, numbness and headaches.  Hematological: Negative for adenopathy. Does not bruise/bleed easily.  Psychiatric/Behavioral: Negative for dysphoric mood. Pos for mod to severe anxiety that is disabling .         Objective:   Physical Exam  Constitutional: She appears well-developed and well-nourished. No distress.  obese and well appearing   HENT:  Head: Normocephalic and atraumatic.  Mouth/Throat: Oropharynx is clear and moist.  Eyes: Conjunctivae and EOM are normal. Pupils are equal, round, and reactive to light.  Neck: Normal range of motion. Neck supple. No JVD present. Carotid bruit is not present. No thyromegaly present.  Cardiovascular: Normal rate, regular rhythm, normal heart sounds and intact distal pulses.  Exam reveals no gallop.   Pulmonary/Chest: Effort normal and breath sounds normal. No respiratory distress. She has no wheezes. She has no rales.  Abdominal: She exhibits no abdominal bruit.  Musculoskeletal: She exhibits no edema.  Lymphadenopathy:    She has no cervical adenopathy.  Neurological: She is alert. She has normal reflexes.  Skin: Skin is warm and dry. No rash noted. No pallor.  Psychiatric: Her mood appears anxious. Her speech is rapid and/or pressured.  Pleasant and attentive but very anxious             Assessment & Plan:

## 2013-04-16 NOTE — Telephone Encounter (Signed)
Relevant patient education assigned to patient using Emmi. ° °

## 2013-04-16 NOTE — Progress Notes (Signed)
Pre visit review using our clinic review tool, if applicable. No additional management support is needed unless otherwise documented below in the visit note. 

## 2013-04-16 NOTE — Patient Instructions (Signed)
Your blood pressure is much better  I'm glad you are feeling better  Stay on the metoprolol  Stay active Eat a low sodium diet    DASH Diet The DASH diet stands for "Dietary Approaches to Stop Hypertension." It is a healthy eating plan that has been shown to reduce high blood pressure (hypertension) in as little as 14 days, while also possibly providing other significant health benefits. These other health benefits include reducing the risk of breast cancer after menopause and reducing the risk of type 2 diabetes, heart disease, colon cancer, and stroke. Health benefits also include weight loss and slowing kidney failure in patients with chronic kidney disease.  DIET GUIDELINES  Limit salt (sodium). Your diet should contain less than 1500 mg of sodium daily.  Limit refined or processed carbohydrates. Your diet should include mostly whole grains. Desserts and added sugars should be used sparingly.  Include small amounts of heart-healthy fats. These types of fats include nuts, oils, and tub margarine. Limit saturated and trans fats. These fats have been shown to be harmful in the body. CHOOSING FOODS  The following food groups are based on a 2000 calorie diet. See your Registered Dietitian for individual calorie needs. Grains and Grain Products (6 to 8 servings daily)  Eat More Often: Whole-wheat bread, brown rice, whole-grain or wheat pasta, quinoa, popcorn without added fat or salt (air popped).  Eat Less Often: White bread, white pasta, white rice, cornbread. Vegetables (4 to 5 servings daily)  Eat More Often: Fresh, frozen, and canned vegetables. Vegetables may be raw, steamed, roasted, or grilled with a minimal amount of fat.  Eat Less Often/Avoid: Creamed or fried vegetables. Vegetables in a cheese sauce. Fruit (4 to 5 servings daily)  Eat More Often: All fresh, canned (in natural juice), or frozen fruits. Dried fruits without added sugar. One hundred percent fruit juice ( cup [237  mL] daily).  Eat Less Often: Dried fruits with added sugar. Canned fruit in light or heavy syrup. [employer], Fish, and Poultry (2 servings or less daily. One serving is 3 to 4 oz [85-114 g]).  Eat More Often: Ninety percent or leaner ground beef, tenderloin, sirloin. Round cuts of beef, chicken breast, [place] breast. All fish. Grill, bake, or broil your meat. Nothing should be fried.  Eat Less Often/Avoid: Fatty cuts of meat, [place], or chicken leg, thigh, or wing. Fried cuts of meat or fish. Dairy (2 to 3 servings)  Eat More Often: Low-fat or fat-free milk, low-fat plain or light yogurt, reduced-fat or part-skim cheese.  Eat Less Often/Avoid: Milk (whole, 2%).Whole milk yogurt. Full-fat cheeses. Nuts, Seeds, and Legumes (4 to 5 servings per week)  Eat More Often: All without added salt.  Eat Less Often/Avoid: Salted nuts and seeds, canned beans with added salt. Fats and Sweets (limited)  Eat More Often: Vegetable oils, tub margarines without trans fats, sugar-free gelatin. Mayonnaise and salad dressings.  Eat Less Often/Avoid: Coconut oils, palm oils, butter, stick margarine, cream, half and half, cookies, candy, pie. FOR MORE INFORMATION The Dash Diet Eating Plan: www.dashdiet.org Document Released: 01/12/2011 Document Revised: 04/17/2011 Document Reviewed: 01/12/2011 [facility] Patient Information 2014 [place], [place].

## 2013-04-17 NOTE — Assessment & Plan Note (Signed)
This had improved and then worsened again-per pt due to severe anxiety Already much imp after re start of beta blocker Will continue this Disc lifestyle change F/u PCP in about a mo

## 2013-04-22 ENCOUNTER — Ambulatory Visit (INDEPENDENT_AMBULATORY_CARE_PROVIDER_SITE_OTHER): Payer: BC Managed Care – PPO | Admitting: Psychology

## 2013-04-22 DIAGNOSIS — F411 Generalized anxiety disorder: Secondary | ICD-10-CM

## 2013-04-22 DIAGNOSIS — F4323 Adjustment disorder with mixed anxiety and depressed mood: Secondary | ICD-10-CM

## 2013-04-29 ENCOUNTER — Ambulatory Visit (INDEPENDENT_AMBULATORY_CARE_PROVIDER_SITE_OTHER): Payer: BC Managed Care – PPO | Admitting: Psychology

## 2013-04-29 DIAGNOSIS — F4323 Adjustment disorder with mixed anxiety and depressed mood: Secondary | ICD-10-CM

## 2013-04-29 DIAGNOSIS — F411 Generalized anxiety disorder: Secondary | ICD-10-CM

## 2013-05-06 ENCOUNTER — Other Ambulatory Visit: Payer: Self-pay | Admitting: Family Medicine

## 2013-05-06 ENCOUNTER — Ambulatory Visit (INDEPENDENT_AMBULATORY_CARE_PROVIDER_SITE_OTHER): Payer: BC Managed Care – PPO | Admitting: Psychology

## 2013-05-06 ENCOUNTER — Other Ambulatory Visit: Payer: Self-pay | Admitting: *Deleted

## 2013-05-06 DIAGNOSIS — F4323 Adjustment disorder with mixed anxiety and depressed mood: Secondary | ICD-10-CM

## 2013-05-06 DIAGNOSIS — F411 Generalized anxiety disorder: Secondary | ICD-10-CM

## 2013-05-06 MED ORDER — VENLAFAXINE HCL ER 150 MG PO CP24: ORAL_CAPSULE | ORAL | Status: DC

## 2013-05-07 ENCOUNTER — Encounter: Payer: Self-pay | Admitting: Neurology

## 2013-05-07 NOTE — Telephone Encounter (Signed)
Sent. Thanks.   

## 2013-05-07 NOTE — Telephone Encounter (Signed)
Refill sent to mail order pharmacy 05/06/13. Spoke to patient and was advised that she is completely out of medication and needs a supply to last her until she gets her medication from mail order pharmacy. Is it okay to refill?

## 2013-05-08 ENCOUNTER — Other Ambulatory Visit: Payer: Self-pay | Admitting: *Deleted

## 2013-05-13 ENCOUNTER — Ambulatory Visit (INDEPENDENT_AMBULATORY_CARE_PROVIDER_SITE_OTHER): Payer: BC Managed Care – PPO | Admitting: Psychology

## 2013-05-13 ENCOUNTER — Telehealth: Payer: Self-pay

## 2013-05-13 DIAGNOSIS — F4323 Adjustment disorder with mixed anxiety and depressed mood: Secondary | ICD-10-CM

## 2013-05-13 DIAGNOSIS — F411 Generalized anxiety disorder: Secondary | ICD-10-CM

## 2013-05-13 NOTE — Telephone Encounter (Signed)
PA faxed through [facility].

## 2013-05-13 NOTE — Telephone Encounter (Signed)
Profound, debilitating anxiety not well controlled on lower doses prev.

## 2013-05-13 NOTE — Telephone Encounter (Signed)
Elizabeth Rivera with [employer] left v/m request status of prior auth for venlafaxine.Please advise.

## 2013-05-13 NOTE — Telephone Encounter (Signed)
In speaking with [employer], the patient needs this PA because the Venlafaxine XR 150 is being prescribed twice daily rather than once daily.  I will need the criteria for BID dosing in order to attempt to get this approved.  Please advise.

## 2013-05-14 NOTE — Telephone Encounter (Signed)
Seth Bake left v/m requesting status of venlafaxine PA.

## 2013-05-16 ENCOUNTER — Other Ambulatory Visit: Payer: Self-pay

## 2013-05-16 NOTE — Telephone Encounter (Signed)
Pt left v/m requesting refill on alprazolam; pt has appt with Dr [place]ita Dunnings on 05/22/13; pt has been out of med 3 days and thought could wait until Monday for refill but now pt feeling nervous and shaky and request med called to [facility] [address].Please advise.

## 2013-05-18 MED ORDER — ALPRAZOLAM 0.25 MG PO TABS: ORAL_TABLET | ORAL | Status: DC

## 2013-05-18 MED ORDER — ALPRAZOLAM ER 1 MG PO TB24: 2.0000 mg | ORAL_TABLET | Freq: Every day | ORAL | Status: DC

## 2013-05-18 NOTE — Telephone Encounter (Signed)
I didn't get this until Sunday.  Please call in.  Thanks.

## 2013-05-19 ENCOUNTER — Ambulatory Visit (INDEPENDENT_AMBULATORY_CARE_PROVIDER_SITE_OTHER): Payer: BC Managed Care – PPO | Admitting: Family Medicine

## 2013-05-19 ENCOUNTER — Encounter: Payer: Self-pay | Admitting: Family Medicine

## 2013-05-19 VITALS — BP 142/84 | HR 78 | Temp 98.3°F | Wt 165.5 lb

## 2013-05-19 DIAGNOSIS — G589 Mononeuropathy, unspecified: Secondary | ICD-10-CM

## 2013-05-19 DIAGNOSIS — E1149 Type 2 diabetes mellitus with other diabetic neurological complication: Secondary | ICD-10-CM

## 2013-05-19 DIAGNOSIS — F411 Generalized anxiety disorder: Secondary | ICD-10-CM

## 2013-05-19 NOTE — Progress Notes (Signed)
Pre visit review using our clinic review tool, if applicable. No additional management support is needed unless otherwise documented below in the visit note.  She is intentionally down 20lbs and her sugar is still controlled, sugar was 91 this AM.  We were going to check her A1c later in summer of 2015.  Discussed.   Anxiety.  She ran out of BZD over the weekend. I didn't realize she was going to run out and she didn't either.  We'll get it called in this AM.  She managed to get by over the weekend, "I stayed home and tried to stay calm."  She didn't leave the house over the weekend. She put in for social security and will likely be terminated by her work in June, per patient report.  She is awaiting Visual [employer].  She still gets anxious and still needs treatment with effexor and the BZD.  "It tears up my nerves" to go out.  She still gets very anxious driving any distance.  She is still seeing Dr. Rexene Edison about once weekly and that has been helpful.    Still in PT.  Still with burning and stinging in the feet and fingers.  Numbness in the fingers/toes noted by patient.  She has f/u with neuro pending for the end of this month.  Walking with a cane today.  She feels more stable with that. PT has been helpful for her.    Meds, vitals, and allergies reviewed.   ROS: See HPI.  Otherwise, noncontributory.  nad ncat Mmm rrr ctab abd soft Ext w/o edema Speech fluent, still appears worried.   Walking with a cane today.

## 2013-05-19 NOTE — Telephone Encounter (Signed)
Medication phoned to pharmacy.  

## 2013-05-19 NOTE — Patient Instructions (Signed)
Recheck labs in July or August before a visit with [place] Dunnings. Take care.  Glad to see you.  I'll await the neuro notes on the meantime.

## 2013-05-20 NOTE — Telephone Encounter (Signed)
PA refaxed

## 2013-05-20 NOTE — Telephone Encounter (Signed)
Done

## 2013-05-20 NOTE — Assessment & Plan Note (Signed)
Continue current meds and counseling, no reason to suspect she would be able to work at this point.  See above.  She agrees with plan. Okay for outpatient f/u.

## 2013-05-20 NOTE — Assessment & Plan Note (Signed)
Per neuro 

## 2013-05-20 NOTE — Assessment & Plan Note (Signed)
Will recheck later in 2015.  She agrees.  Weight decreased intentionally.

## 2013-05-21 NOTE — Telephone Encounter (Signed)
Pt said [facility] cannot refill because [facility] has processed refill; spoke with Caryl Pina at [facility] and ins would not allow refill called to [facility] on 05/19/13 due to [facility] processing 90 day refill on 05/12/13. Pt will contact [employer] about refill of alprazolam.

## 2013-05-27 ENCOUNTER — Telehealth: Payer: Self-pay | Admitting: Family Medicine

## 2013-05-27 ENCOUNTER — Ambulatory Visit: Payer: BC Managed Care – PPO | Admitting: Psychology

## 2013-05-27 MED ORDER — TOLTERODINE TARTRATE 1 MG PO TABS: 1.0000 mg | ORAL_TABLET | Freq: Two times a day (BID) | ORAL | Status: DC

## 2013-05-27 NOTE — Telephone Encounter (Signed)
Patient advised.

## 2013-05-27 NOTE — Telephone Encounter (Signed)
Try taking detrol twice a day and see if that helps. rx sent. Thanks .

## 2013-05-27 NOTE — Telephone Encounter (Signed)
Pt called wanting something called in for bladder infection. She states she cannot control her bladder and that it is way too full. She had to cancel today with Dr. Rexene Edison due to more frequent urination. Please advise.

## 2013-05-29 ENCOUNTER — Other Ambulatory Visit: Payer: Self-pay | Admitting: Family Medicine

## 2013-05-30 ENCOUNTER — Ambulatory Visit (INDEPENDENT_AMBULATORY_CARE_PROVIDER_SITE_OTHER): Payer: BC Managed Care – PPO | Admitting: Family Medicine

## 2013-05-30 ENCOUNTER — Encounter: Payer: Self-pay | Admitting: Family Medicine

## 2013-05-30 VITALS — BP 196/98 | HR 73 | Temp 97.8°F | Wt 161.0 lb

## 2013-05-30 DIAGNOSIS — E1149 Type 2 diabetes mellitus with other diabetic neurological complication: Secondary | ICD-10-CM

## 2013-05-30 NOTE — Progress Notes (Signed)
Pre visit review using our clinic review tool, if applicable. No additional management support is needed unless otherwise documented below in the visit note.  Sugar was 166 yesterday AM.  Took her pills and breakfast, but then sugar dropped to 80. She was fatigued and had make some mistakes at home- put cleaner can if the refrigerator.  She had fallen yesterday AM, as she was getting out of the bed and her feet may have gotten tangled up in the blanket at the time.  No LOC.  No focal neuro changes- ie no weakness.  No knot on her head.  No typical SZ like activity.  Used a cane in the meantime.  Able to bear weight.    BP elevated today.  Compliant with meds.  Weight similar to prev. She is off micardis. Still on metoprolol.   Didn't check sugar this AM.  She has eaten today.    No CP, SOB, BLE edema.   Recheck BP 160/90.    Meds, vitals, and allergies reviewed.   ROS: See HPI.  Otherwise, noncontributory.  A&Ox3, anxious appearing but that is baseline for patient ncat Mmm ctab abd soft Ext w/o edema CN 2-12 wnl B, S/S/DTR wnl x4 except for baseline neuropathy changes.

## 2013-05-30 NOTE — Assessment & Plan Note (Signed)
Likely an isolated low sugar, with neuropathy and bedding contributing to the fall.  BP elevation appears to be isolated and resolving.  She'll use a can for now, continue home exercises, and recheck BP next week and monitor sugar in meantime.  She agrees.  Okay for outpatient f/u.

## 2013-05-30 NOTE — Patient Instructions (Signed)
I think your sugar will level out.   Continue your exercise and recheck your BP at home next week. If persistently elevated then notify me.  Take care.  Glad to see you.

## 2013-06-11 ENCOUNTER — Ambulatory Visit (INDEPENDENT_AMBULATORY_CARE_PROVIDER_SITE_OTHER): Payer: BC Managed Care – PPO | Admitting: Neurology

## 2013-06-11 ENCOUNTER — Encounter: Payer: Self-pay | Admitting: Neurology

## 2013-06-11 VITALS — BP 132/84 | HR 80 | Resp 14 | Ht 62.5 in | Wt 162.0 lb

## 2013-06-11 DIAGNOSIS — G629 Polyneuropathy, unspecified: Secondary | ICD-10-CM

## 2013-06-11 DIAGNOSIS — G609 Hereditary and idiopathic neuropathy, unspecified: Secondary | ICD-10-CM

## 2013-06-11 DIAGNOSIS — G589 Mononeuropathy, unspecified: Secondary | ICD-10-CM

## 2013-06-11 MED ORDER — GABAPENTIN 300 MG PO CAPS: 300.0000 mg | ORAL_CAPSULE | Freq: Three times a day (TID) | ORAL | Status: DC

## 2013-06-11 NOTE — Patient Instructions (Signed)
1.  Will prescribe gabapentin (Neurontin) 300mg  pills.  Take 1 pill three times daily. If you have any side effects such as sleepiness or dizziness, you may cut back to 1 pill at bedtime for one week, then 1 pill twice daily for one week and then 1 pill three times daily. 2.  Continue Lyrica 150mg  in morning and 75mg  at bedtime. 3.  Will schedule for another EMG.  Follow up with me soon after.

## 2013-06-11 NOTE — Progress Notes (Signed)
NEUROLOGY FOLLOW UP OFFICE NOTE  Elizabeth Rivera [id number]  HISTORY OF PRESENT ILLNESS: Elizabeth Rivera is a 63 year old right-handed woman with history of anxiety, diabetes type II, hypothyroidism, B12 deficiency, HTN and HLD who follows up with longstanding history of neuropathy.    UPDATE: 03/13/13 Hgb A1c 6.1 (8.6 in 11/14). 03/20/13 LABS:  SPEP with nonspecific polyclonal type increase in gamma globulin but IFE with no monoclonal protein.  ANA negative, ESR 21  She feels the numbness in the feet and hands are worse.  She feels the pain in the toes and hands are worse.  She does note that the Lyrica and gabapentin both help, however.  She notes increased difficulty feeling the ground and she is much more unsteady.  She has recently finished PT.  She has fallen a couple of times, one time just walking up two steps on the porch.  Her blood sugars have been under much better control.  She has not worked at [employer] for almost a year and will soon be terminated as there are no other jobs for her where she does not need to be on her feet.  She feels that the support of her husband gets her through all this and feels very fortunate for him.  HISTORY: Symptoms have been present for about 10 years.  She describes numbness of the hands, fingers, forearms, toes, feet and legs.  She also notes aching pain in the legs.  She also notes burning pain on the soles of the feet and toes.  She experiences allodynia, where the bed sheet causes pain if over her feet.  She denies shooting pain down the legs.  She has aching pain in the knees as well.  She says that the physical therapist told her that her left hip is a little weaker than the right side.  She reports that she needs to drink several cups of water in order to urinate.  Otherwise, no bladder or bowel dysfunction.  Over the past year and a half, she has had increased gait difficulty and has had falls.  Because of the pain, she can no longer work at [employer],  where she has worked for 29 years.  She was diagnosed with B12 deficiency, but has since been taking supplements.  B12 level from 05/10/12 was 364.  She was diagnosed with diabetes mellitus type II this past summer.  In November 2014, her Hgb A1c was 8.4.  She has hypothyroidism.  TSH was 32.39 in August 2014 and 0.82 in November 2014.  Celiac was negative.  She saw a neurologist in [place], Dr. Melrose Nakayama, who performed a NCV-EMG, which revealed peripheral neuropathy.  She was sent to PT and given wrist splints to wear at night.  The PT has been helpful.  She says the wrist splints help the numbness in the hands and fingers.  She also has a longstanding history of hypothyroidism that was only recently controlled.  She was started on gabapentin 180m twice daily, which helps.  PAST MEDICAL HISTORY: Past Medical History  Diagnosis Date  . Thyroid disease     hypothyroid  . Anxiety   . Hyperlipidemia   . Hypertension   . Other chest pain     chest discomfort  . Obesity   . Neuropathy   . Other esophagitis     erosive esophagitis  . Other specified gastritis without mention of hemorrhage     erosvie gastritis  . Pain in limb  Right leg pain  . Other specified disorders of adrenal glands 07/2007    adrenal mass via of CT 1.4cm left Adrenal no change(Dr.Cope)   . Other abnormal blood chemistry   . Diverticulosis of colon (without mention of hemorrhage) 05/20/2009    Internal hemms (Dr. Elliott)  . Irritable bowel syndrome     history of  . Insomnia, unspecified   . Nervous breakdown 08/2002    Hospital [facility]  . GERD (gastroesophageal reflux disease)     reflux HH 09/01/2002//egd reflux Esoph. HH Gastritis nonbleeding erosive gastropathy Duodentitis 08/15/2006  . Hyperglycemia   . Candidal vaginitis   . Forearm fracture     2013, left    MEDICATIONS: Current Outpatient Prescriptions on File Prior to Visit  Medication Sig Dispense Refill  . ALPRAZolam (XANAX XR) 1 MG 24 hr tablet  Take 2 tablets (2 mg total) by mouth daily.  60 tablet  1  . ALPRAZolam (XANAX) 0.25 MG tablet TAKE 1 TO 2 TABLETS BY MOUTH THREE TIMES DAILY AS NEEDED FOR ANXIETY  30 tablet  1  . aspirin 81 MG EC tablet Take 81 mg by mouth daily.        . atorvastatin (LIPITOR) 20 MG tablet Take 1 tablet (20 mg total) by mouth daily.  90 tablet  3  . BAYER MICROLET LANCETS lancets Ck blood sugar twice a day and as directed. Dx 250.02  200 each  3  . cholecalciferol (VITAMIN D) 1000 UNITS tablet Take 1,000 Units by mouth 2 (two) times daily.       . glucose blood (BAYER CONTOUR NEXT TEST) test strip Ck blood sugar twice a day and as directed. Dx 250.02.  200 each  3  . imipramine (TOFRANIL) 25 MG tablet Take 1-2 tablets (25-50 mg total) by mouth at bedtime.  180 tablet  3  . levothyroxine (SYNTHROID, LEVOTHROID) 175 MCG tablet Take 1 tablet (175 mcg total) by mouth daily before breakfast.  32 tablet  1  . metFORMIN (GLUCOPHAGE) 500 MG tablet Take up to 3 tabs a day  270 tablet  3  . metoCLOPramide (REGLAN) 10 MG tablet Take 10 mg by mouth 3 (three) times daily.       . metoprolol succinate (TOPROL XL) 100 MG 24 hr tablet Take one by mouth twice a day  180 tablet  3  . omeprazole (PRILOSEC) 40 MG capsule Take 40 mg by mouth 3 (three) times daily.       . pregabalin (LYRICA) 75 MG capsule 2 tabs in AM and 1 in PM.  270 capsule  1  . ranitidine (ZANTAC) 300 MG tablet Take 300 mg by mouth 3 (three) times daily.       . tolterodine (DETROL) 1 MG tablet Take 1 tablet (1 mg total) by mouth 2 (two) times daily.  60 tablet  1  . venlafaxine XR (EFFEXOR-XR) 150 MG 24 hr capsule TAKE 1 CAPSULE BY MOUTH TWICE DAILY.  60 capsule  3  . vitamin B-12 (CYANOCOBALAMIN) 1000 MCG tablet Take 1,000 mcg by mouth 2 (two) times daily.         No current facility-administered medications on file prior to visit.    ALLERGIES: Allergies  Allergen Reactions  . Sulfonamide Derivatives     REACTION: rash  . Sulfa Antibiotics Rash     FAMILY HISTORY: Family History  Problem Relation Age of Onset  . Hypertension Mother   . Hyperlipidemia Mother   . Stroke Mother       mini strokes  . Vision loss Father     vision problems  . Cancer Father     Colon   . Hyperlipidemia Sister   . Hypertension Sister   . Vision loss Sister     SOCIAL HISTORY: History   Social History  . Marital Status: Married    Spouse Name: N/A    Number of Children: N/A  . Years of Education: N/A   Occupational History  . Not on file.   Social History Main Topics  . Smoking status: Never Smoker   . Smokeless tobacco: Never Used  . Alcohol Use: No  . Drug Use: No  . Sexual Activity: No   Other Topics Concern  . Not on file   Social History Narrative   No regular exercise.   Works at [employer] in [place]: Constitutional: No fevers, chills, or sweats, no generalized fatigue, change in appetite Eyes: No visual changes, double vision, eye pain Ear, nose and throat: No hearing loss, ear pain, nasal congestion, sore throat Cardiovascular: No chest pain, palpitations Respiratory:  No shortness of breath at rest or with exertion, wheezes GastrointestinaI: No nausea, vomiting, diarrhea, abdominal pain, fecal incontinence Genitourinary:  No dysuria, urinary retention or frequency Musculoskeletal:  No neck pain, back pain Integumentary: No rash, pruritus, skin lesions Neurological: as above Psychiatric: No depression, insomnia, anxiety Endocrine: No palpitations, fatigue, diaphoresis, mood swings, change in appetite, change in weight, increased thirst Hematologic/Lymphatic:  No anemia, purpura, petechiae. Allergic/Immunologic: no itchy/runny eyes, nasal congestion, recent allergic reactions, rashes  PHYSICAL EXAM: Filed Vitals:   06/11/13 1348  BP: 132/84  Pulse: 80  Resp: 14   General: No acute distress Head:  Normocephalic/atraumatic Neck: supple, no paraspinal tenderness, full range of  motion Heart:  Regular rate and rhythm Lungs:  Clear to auscultation bilaterally Back: No paraspinal tenderness Neurological Exam: alert and oriented to person, place, and time. Attention span and concentration intact, recent and remote memory intact, fund of knowledge intact.  Speech fluent and not dysarthric, language intact.  CN II-XII intact. Fundoscopic exam unremarkable without vessel changes, exudates, hemorrhages or papilledema.  Bulk and tone normal, muscle strength 5/5 throughout.  Sensation to pinprick intact.  Notes significant vibratory sensation loss in the lower extremities up to below the knees.  Deep tendon reflexes 2+ throughout, except absent in the ankles and toes downgoing.  Finger to nose intact.  Gait wide-based and cautious with reduced stride.  Romberg with sway.  IMPRESSION: Idiopathic peripheral neuropathy.  Since symptoms are worsening despite control of serum glucose, it may less likely be related to diabetes.  PLAN: 1.  Will get a repeat NCV/EMG as numbness is worsening.  Last EMG was about a year ago. 2.  Will increase gabapentin to 318m three times daily.  Continue Lyrica 1549min AM and 7559mn PM 3.  Follow up after EMG.  Continue exercises and use of cane.  AdaMetta ClinesO  CC:  GraElsie StainD

## 2013-06-16 ENCOUNTER — Other Ambulatory Visit: Payer: Self-pay | Admitting: Family Medicine

## 2013-06-16 NOTE — Telephone Encounter (Signed)
Received refill request electronically from pharmacy. Last office visit 05/30/13. Is it okay to refill medication?

## 2013-06-17 NOTE — Telephone Encounter (Signed)
Sent!

## 2013-07-28 ENCOUNTER — Encounter: Payer: Self-pay | Admitting: Family Medicine

## 2013-07-28 ENCOUNTER — Ambulatory Visit (INDEPENDENT_AMBULATORY_CARE_PROVIDER_SITE_OTHER): Payer: BC Managed Care – PPO | Admitting: Family Medicine

## 2013-07-28 VITALS — BP 156/90 | HR 72 | Temp 98.2°F | Wt 166.0 lb

## 2013-07-28 DIAGNOSIS — E1149 Type 2 diabetes mellitus with other diabetic neurological complication: Secondary | ICD-10-CM

## 2013-07-28 DIAGNOSIS — R5383 Other fatigue: Secondary | ICD-10-CM

## 2013-07-28 DIAGNOSIS — G589 Mononeuropathy, unspecified: Secondary | ICD-10-CM

## 2013-07-28 DIAGNOSIS — R5381 Other malaise: Secondary | ICD-10-CM

## 2013-07-28 DIAGNOSIS — E119 Type 2 diabetes mellitus without complications: Secondary | ICD-10-CM

## 2013-07-28 LAB — CBC WITH DIFFERENTIAL/PLATELET
BASOS ABS: 0 10*3/uL (ref 0.0–0.1)
Basophils Relative: 0.5 % (ref 0.0–3.0)
EOS PCT: 2.6 % (ref 0.0–5.0)
Eosinophils Absolute: 0.2 10*3/uL (ref 0.0–0.7)
HCT: 32.5 % — ABNORMAL LOW (ref 36.0–46.0)
Hemoglobin: 10.8 g/dL — ABNORMAL LOW (ref 12.0–15.0)
Lymphocytes Relative: 30.3 % (ref 12.0–46.0)
Lymphs Abs: 1.8 10*3/uL (ref 0.7–4.0)
MCHC: 33.2 g/dL (ref 30.0–36.0)
MCV: 84.1 fl (ref 78.0–100.0)
MONO ABS: 0.4 10*3/uL (ref 0.1–1.0)
Monocytes Relative: 7 % (ref 3.0–12.0)
NEUTROS PCT: 59.6 % (ref 43.0–77.0)
Neutro Abs: 3.6 10*3/uL (ref 1.4–7.7)
PLATELETS: 195 10*3/uL (ref 150.0–400.0)
RBC: 3.86 Mil/uL — ABNORMAL LOW (ref 3.87–5.11)
RDW: 13.7 % (ref 11.5–15.5)
WBC: 6 10*3/uL (ref 4.0–10.5)

## 2013-07-28 LAB — TSH: TSH: 0.03 u[IU]/mL — AB (ref 0.35–4.50)

## 2013-07-28 LAB — HEMOGLOBIN A1C: HEMOGLOBIN A1C: 6.5 % (ref 4.6–6.5)

## 2013-07-28 MED ORDER — PREGABALIN 75 MG PO CAPS: ORAL_CAPSULE | ORAL | Status: DC

## 2013-07-28 NOTE — Progress Notes (Signed)
Pre visit review using our clinic review tool, if applicable. No additional management support is needed unless otherwise documented below in the visit note.  Fatigue.  Needing a nap.  "My energy is gone."  She lost her job in the meantime.  She is on disability; she cried when she found out it went through.  Still exhausted.  Her mother is in the hospital with a CVA, at [facility].  Her mother is persistently confused, was prev independent- she'll likely need rehab/placement at inpatient d/c.  Patient with hx of depression, compliant with meds.  Mood is as good as could be expected with the upheaval.    She needed more lyrica; it had helped the neuropathy a lot.  She has routine f/u with neuro pending.    Diabetes:  Using medications without difficulties:yes Hypoglycemic episodes:no Hyperglycemic episodes:no Feet problems:no Blood Sugars averaging: max 110 in the AM. Weight is down.   See notes on labs.    PMH and SH reviewed  Meds, vitals, and allergies reviewed.   ROS: See HPI.  Otherwise negative.    GEN: nad, alert and oriented, flat affect HEENT: mucous membranes moist NECK: supple w/o LA CV: rrr. PULM: ctab, no inc wob ABD: soft, +bs EXT: no edema SKIN: no acute rash

## 2013-07-28 NOTE — Patient Instructions (Signed)
Go to the lab on the way out.  We'll contact you with your lab report. Don't change your meds for now. Take care.  I would cancel the appointments here on 08/14/13 and 08/21/13.

## 2013-07-29 ENCOUNTER — Other Ambulatory Visit: Payer: Self-pay | Admitting: Family Medicine

## 2013-07-29 DIAGNOSIS — R5383 Other fatigue: Secondary | ICD-10-CM

## 2013-07-29 DIAGNOSIS — R5381 Other malaise: Secondary | ICD-10-CM | POA: Insufficient documentation

## 2013-07-29 NOTE — Assessment & Plan Note (Signed)
Continue lyrica 

## 2013-07-29 NOTE — Assessment & Plan Note (Signed)
D/w pt about diet and weight.  See notes on labs.  >25 minutes spent in face to face time with patient in total, >50% spent in counselling or coordination of care.

## 2013-07-29 NOTE — Assessment & Plan Note (Signed)
Likely related to mult social stressors and depression, still okay for outpatient f/u with no SI/HI.  See notes on labs.

## 2013-07-31 ENCOUNTER — Encounter: Payer: Self-pay | Admitting: Radiology

## 2013-07-31 ENCOUNTER — Other Ambulatory Visit (INDEPENDENT_AMBULATORY_CARE_PROVIDER_SITE_OTHER): Payer: BC Managed Care – PPO

## 2013-07-31 DIAGNOSIS — R5381 Other malaise: Secondary | ICD-10-CM

## 2013-07-31 DIAGNOSIS — R5383 Other fatigue: Principal | ICD-10-CM

## 2013-07-31 LAB — IBC PANEL
IRON: 47 ug/dL (ref 42–145)
Saturation Ratios: 16.2 % — ABNORMAL LOW (ref 20.0–50.0)
Transferrin: 206.8 mg/dL — ABNORMAL LOW (ref 212.0–360.0)

## 2013-07-31 LAB — HEMOGLOBIN: Hemoglobin: 10.6 g/dL — ABNORMAL LOW (ref 12.0–15.0)

## 2013-07-31 LAB — TSH: TSH: 0.04 u[IU]/mL — ABNORMAL LOW (ref 0.35–4.50)

## 2013-08-03 ENCOUNTER — Other Ambulatory Visit: Payer: Self-pay | Admitting: Family Medicine

## 2013-08-03 DIAGNOSIS — D509 Iron deficiency anemia, unspecified: Secondary | ICD-10-CM

## 2013-08-03 DIAGNOSIS — E039 Hypothyroidism, unspecified: Secondary | ICD-10-CM

## 2013-08-03 MED ORDER — LEVOTHYROXINE SODIUM 175 MCG PO TABS: ORAL_TABLET | ORAL | Status: DC

## 2013-08-03 MED ORDER — FERROUS SULFATE 325 (65 FE) MG PO TABS: 325.0000 mg | ORAL_TABLET | Freq: Every day | ORAL | Status: DC

## 2013-08-05 DIAGNOSIS — K589 Irritable bowel syndrome without diarrhea: Secondary | ICD-10-CM | POA: Insufficient documentation

## 2013-08-05 DIAGNOSIS — D509 Iron deficiency anemia, unspecified: Secondary | ICD-10-CM | POA: Insufficient documentation

## 2013-08-11 ENCOUNTER — Ambulatory Visit: Payer: Self-pay | Admitting: Unknown Physician Specialty

## 2013-08-11 LAB — HM COLONOSCOPY: HM Colonoscopy: NORMAL

## 2013-08-12 ENCOUNTER — Telehealth: Payer: Self-pay | Admitting: Family Medicine

## 2013-08-12 DIAGNOSIS — D649 Anemia, unspecified: Secondary | ICD-10-CM

## 2013-08-12 NOTE — Telephone Encounter (Signed)
Ordered. Thanks

## 2013-08-12 NOTE — Telephone Encounter (Signed)
Pt says she had endoscopy and colonoscopy yesterday, but nothing was found in ref to her blood/iron being so low.  Dr. Tiffany Kocher told pt to call and ask for referral to hematologist. Can you place referral? Thank you.

## 2013-08-14 ENCOUNTER — Other Ambulatory Visit: Payer: BC Managed Care – PPO

## 2013-08-14 LAB — PATHOLOGY REPORT

## 2013-08-18 ENCOUNTER — Telehealth: Payer: Self-pay | Admitting: Hematology and Oncology

## 2013-08-18 ENCOUNTER — Encounter: Payer: Self-pay | Admitting: Family Medicine

## 2013-08-18 ENCOUNTER — Telehealth: Payer: Self-pay

## 2013-08-18 DIAGNOSIS — K2270 Barrett's esophagus without dysplasia: Secondary | ICD-10-CM | POA: Insufficient documentation

## 2013-08-18 NOTE — Telephone Encounter (Signed)
Pt left v/m; pt saw DR Vira Agar and colonoscopy and endoscopy were OK, no abnormality seen; Dr Vira Agar advised pt should see hematologist. Pt request referral to hematologist.Please advise. I called pt back for more information;pt said some one called her right after she left v/m and pt already has appt with hematologist 08/27/13.

## 2013-08-18 NOTE — Telephone Encounter (Signed)
S/W PATIENT AND GAVE NP APPT FOR 07/27 @ 1:30 W/DR. [place].  REFERRING DR. Elsie Stain DX- ANEMIA; UNSPECIFIED ANEMIA TYPE

## 2013-08-18 NOTE — Telephone Encounter (Signed)
Noted, thanks!

## 2013-08-21 ENCOUNTER — Ambulatory Visit: Payer: BC Managed Care – PPO | Admitting: Family Medicine

## 2013-08-25 ENCOUNTER — Ambulatory Visit (INDEPENDENT_AMBULATORY_CARE_PROVIDER_SITE_OTHER): Payer: BC Managed Care – PPO | Admitting: Neurology

## 2013-08-25 DIAGNOSIS — G589 Mononeuropathy, unspecified: Secondary | ICD-10-CM

## 2013-08-25 DIAGNOSIS — G629 Polyneuropathy, unspecified: Secondary | ICD-10-CM

## 2013-08-25 NOTE — Procedures (Signed)
[facility]  [address]  [place], [place] [address] Tel: [phone] Fax:  [phone] Test Date:  08/25/2013  Patient: Elizabeth Rivera DOB: 09-Jun-1950 Physician: Narda Amber, DO  Sex: Female Height: 5\' 2"  Ref Phys: Metta Clines  ID#: [id number] Temp: 35.0C Technician: Laureen Ochs R. NCS T.   Patient Complaints: Patient is a 63 year old female here for evaluation of multiple falls and gait disturbance as well as paresthesias of all four extremities.  NCV & EMG Findings: Extensive electrodiagnostic testing of the left upper and lower extremity reveals:  1. Median sensory nerve showed prolonged distal peak latency (4.3 ms) with preserved amplitude.  The ulnar and radial sensory responses are within normal limits.  2. Median and ulnar motor responses are within normal limits. 3. Sural and superficial sensory responses are within normal limits.  4. Tibial and peroneal motor responses are within normal limits.  5. There is no evidence of active or chronic motor axon loss changes involving the any of the tested muscles.  Impression: 1. Right median neuropathy at or distal to the wrist, consistent with clinical diagnosis of carpal tunnel from. Overall, these findings are mild to moderate in degree electrically.  2. There is no evidence of a generalized sensorimotor polyneuropathy, cervical radiculopathy, or lumbosacral radiculopathy affecting the left side.   ___________________________ Narda Amber, DO    Nerve Conduction Studies Anti Sensory Summary Table   Site NR Peak (ms) Norm Peak (ms) P-T Amp (V) Norm P-T Amp  Left Median Anti Sensory (2nd Digit)  35C  Wrist    4.3 <3.8 26.8 >10  Left Radial Anti Sensory (Base 1st Digit)  35C  Wrist    2.3 <2.8 27.6 >10  Left Sup Peroneal Anti Sensory (Ant Lat Mall)  35C  12 cm    2.8 <4.6 7.8 >3  Left Sural Anti Sensory (Lat Mall)  Calf    3.4 <4.6 8.9 >3  Left Ulnar Anti Sensory (5th Digit)  35C  Wrist     2.9 <3.2 17.2 >5   Motor Summary Table   Site NR Onset (ms) Norm Onset (ms) O-P Amp (mV) Norm O-P Amp Site1 Site2 Delta-0 (ms) Dist (cm) Vel (m/s) Norm Vel (m/s)  Left Median Motor (Abd Poll Brev)  35C  Wrist    3.8 <4.0 9.2 >5 Elbow Wrist 4.2 21.0 50 >50  Elbow    8.0  8.4         Left Peroneal Motor (Ext Dig Brev)  Ankle    3.6 <6.0 6.3 >2.5 B Fib Ankle 5.8 26.0 45 >40  B Fib    9.4  5.7  Poplt B Fib 2.1 10.0 48 >40  Poplt    11.5  5.7         Left Tibial Motor (Abd Hall Brev)  Ankle    3.6 <6.0 9.5 >4 Knee Ankle 7.3 31.0 42 >40  Knee    10.9  7.8         Left Ulnar Motor (Abd Dig Minimi)  35C  Wrist    2.2 <3.1 9.7 >7 B Elbow Wrist 3.0 17.0 57 >50  B Elbow    5.2  9.6  A Elbow B Elbow 2.2 12.0 55 >50  A Elbow    7.4  9.6          F Wave Studies   NR F-Lat (ms) Lat Norm (ms) L-R F-Lat (ms)  Left Tibial (Mrkrs) (Abd Hallucis)     49.70 <  55   Left Ulnar (Mrkrs) (Abd Dig Min)  35C     26.51 <33    EMG   Side Muscle Ins Act Fibs Psw Fasc Number Recrt Dur Dur. Amp Amp. Poly Poly. Comment  Left AntTibialis Nml Nml Nml Nml Nml Nml Nml Nml Nml Nml Nml Nml N/A  Left Gastroc Nml Nml Nml Nml Nml Nml Nml Nml Nml Nml Nml Nml N/A  Left Flex Dig Long Nml Nml Nml Nml Nml Nml Nml Nml Nml Nml Nml Nml N/A  Left RectFemoris Nml Nml Nml Nml Nml Nml Nml Nml Nml Nml Nml Nml N/A  Left GluteusMed Nml Nml Nml Nml Nml Nml Nml Nml Nml Nml Nml Nml N/A  Left 1stDorInt Nml Nml Nml Nml Nml Nml Nml Nml Nml Nml Nml Nml N/A  Left PronatorTeres Nml Nml Nml Nml Nml Nml Nml Nml Nml Nml Nml Nml N/A  Left Ext Indicis Nml Nml Nml Nml Nml Nml Nml Nml Nml Nml Nml Nml N/A  Left Triceps Nml Nml Nml Nml Nml Nml Nml Nml Nml Nml Nml Nml N/A  Left Deltoid Nml Nml Nml Nml Nml Nml Nml Nml Nml Nml Nml Nml N/A      Waveforms:

## 2013-08-26 ENCOUNTER — Encounter: Payer: Self-pay | Admitting: Family Medicine

## 2013-08-26 ENCOUNTER — Other Ambulatory Visit: Payer: Self-pay | Admitting: *Deleted

## 2013-08-26 MED ORDER — BAYER MICROLET LANCETS MISC: Status: DC

## 2013-08-26 MED ORDER — GLUCOSE BLOOD VI STRP: ORAL_STRIP | Status: DC

## 2013-09-01 ENCOUNTER — Ambulatory Visit (HOSPITAL_BASED_OUTPATIENT_CLINIC_OR_DEPARTMENT_OTHER): Payer: BC Managed Care – PPO | Admitting: Hematology and Oncology

## 2013-09-01 ENCOUNTER — Encounter: Payer: Self-pay | Admitting: Hematology and Oncology

## 2013-09-01 ENCOUNTER — Other Ambulatory Visit (HOSPITAL_BASED_OUTPATIENT_CLINIC_OR_DEPARTMENT_OTHER): Payer: BC Managed Care – PPO

## 2013-09-01 ENCOUNTER — Other Ambulatory Visit: Payer: Self-pay | Admitting: *Deleted

## 2013-09-01 ENCOUNTER — Ambulatory Visit: Payer: BC Managed Care – PPO

## 2013-09-01 ENCOUNTER — Telehealth: Payer: Self-pay | Admitting: Hematology and Oncology

## 2013-09-01 VITALS — BP 162/94 | Temp 98.3°F | Ht 62.5 in | Wt 166.0 lb

## 2013-09-01 DIAGNOSIS — D638 Anemia in other chronic diseases classified elsewhere: Secondary | ICD-10-CM

## 2013-09-01 DIAGNOSIS — R5383 Other fatigue: Secondary | ICD-10-CM

## 2013-09-01 DIAGNOSIS — R799 Abnormal finding of blood chemistry, unspecified: Secondary | ICD-10-CM

## 2013-09-01 DIAGNOSIS — D649 Anemia, unspecified: Secondary | ICD-10-CM

## 2013-09-01 DIAGNOSIS — E039 Hypothyroidism, unspecified: Secondary | ICD-10-CM

## 2013-09-01 DIAGNOSIS — E538 Deficiency of other specified B group vitamins: Secondary | ICD-10-CM

## 2013-09-01 DIAGNOSIS — E1142 Type 2 diabetes mellitus with diabetic polyneuropathy: Secondary | ICD-10-CM

## 2013-09-01 DIAGNOSIS — R5381 Other malaise: Secondary | ICD-10-CM

## 2013-09-01 DIAGNOSIS — E1149 Type 2 diabetes mellitus with other diabetic neurological complication: Secondary | ICD-10-CM

## 2013-09-01 DIAGNOSIS — K589 Irritable bowel syndrome without diarrhea: Secondary | ICD-10-CM

## 2013-09-01 HISTORY — DX: Anemia in other chronic diseases classified elsewhere: D63.8

## 2013-09-01 LAB — COMPREHENSIVE METABOLIC PANEL (CC13)
ALK PHOS: 86 U/L (ref 40–150)
ALT: 13 U/L (ref 0–55)
ANION GAP: 9 meq/L (ref 3–11)
AST: 17 U/L (ref 5–34)
Albumin: 3.7 g/dL (ref 3.5–5.0)
BUN: 21.5 mg/dL (ref 7.0–26.0)
CO2: 24 mEq/L (ref 22–29)
CREATININE: 1.1 mg/dL (ref 0.6–1.1)
Calcium: 9.5 mg/dL (ref 8.4–10.4)
Chloride: 103 mEq/L (ref 98–109)
GLUCOSE: 102 mg/dL (ref 70–140)
Potassium: 4 mEq/L (ref 3.5–5.1)
SODIUM: 136 meq/L (ref 136–145)
TOTAL PROTEIN: 7.5 g/dL (ref 6.4–8.3)
Total Bilirubin: 0.46 mg/dL (ref 0.20–1.20)

## 2013-09-01 LAB — CBC & DIFF AND RETIC
BASO%: 0.4 % (ref 0.0–2.0)
Basophils Absolute: 0 10*3/uL (ref 0.0–0.1)
EOS%: 2.4 % (ref 0.0–7.0)
Eosinophils Absolute: 0.1 10*3/uL (ref 0.0–0.5)
HCT: 32.1 % — ABNORMAL LOW (ref 34.8–46.6)
HGB: 10.8 g/dL — ABNORMAL LOW (ref 11.6–15.9)
IMMATURE RETIC FRACT: 8 % (ref 1.60–10.00)
LYMPH#: 1.9 10*3/uL (ref 0.9–3.3)
LYMPH%: 35.4 % (ref 14.0–49.7)
MCH: 27.4 pg (ref 25.1–34.0)
MCHC: 33.6 g/dL (ref 31.5–36.0)
MCV: 81.5 fL (ref 79.5–101.0)
MONO#: 0.3 10*3/uL (ref 0.1–0.9)
MONO%: 5.6 % (ref 0.0–14.0)
NEUT#: 3 10*3/uL (ref 1.5–6.5)
NEUT%: 56.2 % (ref 38.4–76.8)
Platelets: 182 10*3/uL (ref 145–400)
RBC: 3.94 10*6/uL (ref 3.70–5.45)
RDW: 13.6 % (ref 11.2–14.5)
RETIC %: 1.43 % (ref 0.70–2.10)
Retic Ct Abs: 56.34 10*3/uL (ref 33.70–90.70)
WBC: 5.4 10*3/uL (ref 3.9–10.3)

## 2013-09-01 LAB — CHCC SMEAR

## 2013-09-01 MED ORDER — METOPROLOL SUCCINATE ER 100 MG PO TB24: ORAL_TABLET | ORAL | Status: DC

## 2013-09-01 MED ORDER — LEVOTHYROXINE SODIUM 175 MCG PO TABS: ORAL_TABLET | ORAL | Status: DC

## 2013-09-01 NOTE — Telephone Encounter (Signed)
Pt confirmed labs per 07/27 POF, gave pt AVS...Marland KitchenMarland KitchenKJ

## 2013-09-01 NOTE — Telephone Encounter (Signed)
Electronic refill request. Last Filled:   Alprazolam 1 mg #60 tablet 1 RF on  05/18/13.  Last Filled:   Alprazolam 0.25 mg  #30 tablet RF on   1 on 05/18/13.  Please advise.

## 2013-09-01 NOTE — Progress Notes (Signed)
Checked in new patient with no financial issues prior to seeing the dr. She has appt card and has not been out of the country. °

## 2013-09-02 ENCOUNTER — Other Ambulatory Visit: Payer: Self-pay | Admitting: Hematology and Oncology

## 2013-09-02 ENCOUNTER — Encounter: Payer: Self-pay | Admitting: Family Medicine

## 2013-09-02 ENCOUNTER — Telehealth: Payer: Self-pay | Admitting: Hematology and Oncology

## 2013-09-02 DIAGNOSIS — R768 Other specified abnormal immunological findings in serum: Secondary | ICD-10-CM | POA: Insufficient documentation

## 2013-09-02 DIAGNOSIS — D638 Anemia in other chronic diseases classified elsewhere: Secondary | ICD-10-CM

## 2013-09-02 LAB — IGG, IGA, IGM
IGG (IMMUNOGLOBIN G), SERUM: 1160 mg/dL (ref 690–1700)
IgA: 427 mg/dL — ABNORMAL HIGH (ref 69–380)
IgM, Serum: 119 mg/dL (ref 52–322)

## 2013-09-02 LAB — KAPPA/LAMBDA LIGHT CHAINS
KAPPA FREE LGHT CHN: 3.04 mg/dL — AB (ref 0.33–1.94)
KAPPA LAMBDA RATIO: 1.33 (ref 0.26–1.65)
Lambda Free Lght Chn: 2.29 mg/dL (ref 0.57–2.63)

## 2013-09-02 LAB — VITAMIN B12: VITAMIN B 12: 527 pg/mL (ref 211–911)

## 2013-09-02 LAB — FERRITIN CHCC: Ferritin: 144 ng/ml (ref 9–269)

## 2013-09-02 LAB — IRON AND TIBC CHCC
%SAT: 15 % — ABNORMAL LOW (ref 21–57)
Iron: 43 ug/dL (ref 41–142)
TIBC: 286 ug/dL (ref 236–444)
UIBC: 243 ug/dL (ref 120–384)

## 2013-09-02 LAB — SEDIMENTATION RATE: SED RATE: 27 mm/h — AB (ref 0–22)

## 2013-09-02 MED ORDER — ALPRAZOLAM 0.25 MG PO TABS: ORAL_TABLET | ORAL | Status: DC

## 2013-09-02 MED ORDER — ALPRAZOLAM ER 1 MG PO TB24: 2.0000 mg | ORAL_TABLET | Freq: Every day | ORAL | Status: DC

## 2013-09-02 NOTE — Telephone Encounter (Signed)
Printed.  Thanks.  

## 2013-09-02 NOTE — Telephone Encounter (Signed)
I reviewed test result with the patient. She has persistent anemia chronic disease. She has elevated light chain and IgA level. I will recheck her blood work and see her back in a year's time. I addressed all questions.

## 2013-09-02 NOTE — Telephone Encounter (Signed)
Faxed to [facility].

## 2013-09-02 NOTE — Progress Notes (Signed)
[facility] CONSULT NOTE  Patient Care Team: Tonia Ghent, MD as PCP - General (Family Medicine)  CHIEF COMPLAINTS/PURPOSE OF CONSULTATION:  Chronic anemia  HISTORY OF PRESENTING ILLNESS:  Elizabeth Rivera 63 y.o. female is here because of anemia  This patient had background history of chronic anemia, and diabetes with peripheral neuropathy and irritable bowel syndrome. She also has history of vitamin B12 deficiency, was on replacement therapy up until recently.  She was found to have abnormal CBC from followup blood test with her primary care provider. Her blood work from 2012 were normal. Starting in June of 2015, she had anemia ranging from 10.6-10.8. She denies recent chest pain on exertion, shortness of breath on minimal exertion, pre-syncopal episodes, or palpitations. She complained of profound fatigue. She had not noticed any recent bleeding such as epistaxis, hematuria or hematochezia The patient denies over the counter NSAID ingestion. She is not on antiplatelets agents. Her last colonoscopy was in 2015 which showed no evidence of bleeding. Some diverticular disease was noted. She had history of erosive esophagitis. She had no prior history or diagnosis of cancer. Her age appropriate screening programs are up-to-date. She denies any pica and eats a variety of diet. She never donated blood. She had received blood transfusion in the past after chest wall surgery.  The patient was prescribed oral iron supplements and she takes one supplement a day, discontinue in July recently for colonoscopy.  MEDICAL HISTORY:  Past Medical History  Diagnosis Date  . Thyroid disease     hypothyroid  . Anxiety   . Hyperlipidemia   . Hypertension   . Other chest pain     chest discomfort  . Obesity   . Neuropathy   . Other esophagitis     erosive esophagitis  . Other specified gastritis without mention of hemorrhage     erosvie gastritis  . Pain in limb     Right leg  pain  . Other specified disorders of adrenal glands 07/2007    adrenal mass via of CT 1.4cm left Adrenal no change(Dr.Cope)   . Other abnormal blood chemistry   . Diverticulosis of colon (without mention of hemorrhage) 05/20/2009    Internal hemms (Dr. Vira Agar)  . Irritable bowel syndrome     history of  . Insomnia, unspecified   . Nervous breakdown 08/2002    [facility]  . GERD (gastroesophageal reflux disease)     reflux HH 09/01/2002//egd reflux Esoph. HH Gastritis nonbleeding erosive gastropathy Duodentitis 08/15/2006  . Hyperglycemia   . Candidal vaginitis   . Forearm fracture     2013, left  . Anemia   . Anemia of other chronic disease 09/01/2013    SURGICAL HISTORY: Past Surgical History  Procedure Laterality Date  . Cholecystectomy  03/2004  . Abdominal hysterectomy  1995    Part Hyst BSO DUB ovarian cyst B9  . Vaginal delivery  1977  . Breast reduction surgery  1984  . Cystoscopy  01/08/2008    Former site of inflammation healed (Dr. Jacqlyn Larsen)  . Nsvd x1  1977  . Esophagogastroduodenoscopy  1. 09/01/02  2. 08/15/06    1. Reflux, HH  2. Reflux esoph HH gaastritis nonbleeding erosive gastropathy duodenitis  . Colonoscopy  1. 08/15/06  2. 05/20/09    Int Hemms  2. Divertics Int Hemms (Dr. Vira Agar)  . Ct abd w & pelvis wo cm  6/09    CT Scan abd stable adrenal adenoma 1.4cm left adrenal no change (  Dr. Jacqlyn Larsen)    SOCIAL HISTORY: History   Social History  . Marital Status: Married    Spouse Name: N/A    Number of Children: N/A  . Years of Education: N/A   Occupational History  . Not on file.   Social History Main Topics  . Smoking status: Never Smoker   . Smokeless tobacco: Never Used  . Alcohol Use: No  . Drug Use: No  . Sexual Activity: No   Other Topics Concern  . Not on file   Social History Narrative   No regular exercise.   Works at [employer] in [place].      FAMILY HISTORY: Family History  Problem Relation Age of Onset  . Hypertension Mother   .  Hyperlipidemia Mother   . Stroke Mother     mini strokes  . Vision loss Father     vision problems  . Cancer Father     Colon   . Hyperlipidemia Sister   . Hypertension Sister   . Vision loss Sister     ALLERGIES:  is allergic to sulfonamide derivatives and sulfa antibiotics.  MEDICATIONS:  Current Outpatient Prescriptions  Medication Sig Dispense Refill  . atorvastatin (LIPITOR) 20 MG tablet Take 1 tablet (20 mg total) by mouth daily.  90 tablet  3  . BAYER MICROLET LANCETS lancets Ck blood sugar twice a day and as directed. Dx 250.02  200 each  3  . cholecalciferol (VITAMIN D) 1000 UNITS tablet Take 1,000 Units by mouth 2 (two) times daily.       . ferrous sulfate 325 (65 FE) MG tablet Take 1 tablet (325 mg total) by mouth daily with breakfast.      . gabapentin (NEURONTIN) 300 MG capsule Take 1 capsule (300 mg total) by mouth 3 (three) times daily.  90 capsule  5  . glucose blood (BAYER CONTOUR NEXT TEST) test strip Ck blood sugar twice a day and as directed. Dx 250.02.  200 each  3  . imipramine (TOFRANIL) 25 MG tablet TAKE 1 TO 2 TABLETS BY MOUTH EVERY NIGHT AT BEDTIME  180 tablet  1  . metFORMIN (GLUCOPHAGE) 500 MG tablet Take up to 3 tabs a day  270 tablet  3  . metoCLOPramide (REGLAN) 10 MG tablet Take 10 mg by mouth 3 (three) times daily.       Marland Kitchen omeprazole (PRILOSEC) 40 MG capsule Take 40 mg by mouth 3 (three) times daily.       . pregabalin (LYRICA) 75 MG capsule 2 tabs in AM and 1 in PM.  270 capsule  1  . ranitidine (ZANTAC) 300 MG tablet Take 300 mg by mouth 3 (three) times daily.       Marland Kitchen tolterodine (DETROL) 1 MG tablet Take 1 tablet (1 mg total) by mouth 2 (two) times daily.  60 tablet  1  . venlafaxine XR (EFFEXOR-XR) 150 MG 24 hr capsule TAKE 1 CAPSULE BY MOUTH TWICE DAILY.  60 capsule  3  . vitamin B-12 (CYANOCOBALAMIN) 1000 MCG tablet Take 1,000 mcg by mouth 2 (two) times daily.        Marland Kitchen ALPRAZolam (XANAX XR) 1 MG 24 hr tablet Take 2 tablets (2 mg total) by mouth  daily.  60 tablet  2  . ALPRAZolam (XANAX) 0.25 MG tablet TAKE 1 TO 2 TABLETS BY MOUTH THREE TIMES DAILY AS NEEDED FOR ANXIETY  30 tablet  2  . levothyroxine (SYNTHROID, LEVOTHROID) 175 MCG tablet Take 1 a day  except for 0.5 tab on Sundays.  6.5 tabs in 7 days.  90 tablet  3  . metoprolol succinate (TOPROL XL) 100 MG 24 hr tablet Take one by mouth twice a day  180 tablet  3   No current facility-administered medications for this visit.    REVIEW OF SYSTEMS:   Constitutional: Denies fevers, chills or abnormal night sweats Eyes: Denies blurriness of vision, double vision or watery eyes Ears, nose, mouth, throat, and face: Denies mucositis or sore throat Respiratory: Denies cough, dyspnea or wheezes Cardiovascular: Denies palpitation, chest discomfort or lower extremity swelling Gastrointestinal:  Denies nausea, heartburn or change in bowel habits. She have irritable bowel syndrome with diarrhea with anxiety.  Skin: Denies abnormal skin rashes Lymphatics: Denies new lymphadenopathy or easy bruising Neurological:Denies numbness, tingling or new weaknesses. She have chronic peripheral neuropathy.  Behavioral/Psych: Mood is stable, no new changes  All other systems were reviewed with the patient and are negative.  PHYSICAL EXAMINATION: ECOG PERFORMANCE STATUS: 1 - Symptomatic but completely ambulatory  Filed Vitals:   09/01/13 1428  BP: 162/94  Temp: 98.3 F (36.8 C)   Filed Weights   09/01/13 1428  Weight: 166 lb (75.297 kg)    GENERAL:alert, no distress and comfortable SKIN: skin color, texture, turgor are normal, no rashes or significant lesions EYES: normal, conjunctiva are pink and non-injected, sclera clear OROPHARYNX:no exudate, no erythema and lips, buccal mucosa, and tongue normal  NECK: supple, thyroid normal size, non-tender, without nodularity LYMPH:  no palpable lymphadenopathy in the cervical, axillary or inguinal LUNGS: clear to auscultation and percussion with  normal breathing effort HEART: regular rate & rhythm and no murmurs and no lower extremity edema ABDOMEN:abdomen soft, non-tender and normal bowel sounds Musculoskeletal:no cyanosis of digits and no clubbing  PSYCH: alert & oriented x 3 with fluent speech NEURO: no focal motor/sensory deficits  LABORATORY DATA:  I have reviewed the data as listed Recent Results (from the past 2160 hour(s))  CBC WITH DIFFERENTIAL     Status: Abnormal   Collection Time    07/28/13  1:10 PM      Result Value Ref Range   WBC 6.0  4.0 - 10.5 K/uL   RBC 3.86 (*) 3.87 - 5.11 Mil/uL   Hemoglobin 10.8 (*) 12.0 - 15.0 g/dL   HCT 32.5 (*) 36.0 - 46.0 %   MCV 84.1  78.0 - 100.0 fl   MCHC 33.2  30.0 - 36.0 g/dL   RDW 13.7  11.5 - 15.5 %   Platelets 195.0  150.0 - 400.0 K/uL   Neutrophils Relative % 59.6  43.0 - 77.0 %   Lymphocytes Relative 30.3  12.0 - 46.0 %   Monocytes Relative 7.0  3.0 - 12.0 %   Eosinophils Relative 2.6  0.0 - 5.0 %   Basophils Relative 0.5  0.0 - 3.0 %   Neutro Abs 3.6  1.4 - 7.7 K/uL   Lymphs Abs 1.8  0.7 - 4.0 K/uL   Monocytes Absolute 0.4  0.1 - 1.0 K/uL   Eosinophils Absolute 0.2  0.0 - 0.7 K/uL   Basophils Absolute 0.0  0.0 - 0.1 K/uL  HEMOGLOBIN A1C     Status: None   Collection Time    07/28/13  1:10 PM      Result Value Ref Range   Hemoglobin A1C 6.5  4.6 - 6.5 %   Comment: Glycemic Control Guidelines for People with Diabetes:Non Diabetic:  <6%Goal of Therapy: <7%Additional Action Suggested:  >8%  TSH     Status: Abnormal   Collection Time    07/28/13  1:10 PM      Result Value Ref Range   TSH 0.03 (*) 0.35 - 4.50 uIU/mL  TSH     Status: Abnormal   Collection Time    07/31/13  1:11 PM      Result Value Ref Range   TSH 0.04 (*) 0.35 - 4.50 uIU/mL  HEMOGLOBIN     Status: Abnormal   Collection Time    07/31/13  1:11 PM      Result Value Ref Range   Hemoglobin 10.6 (*) 12.0 - 15.0 g/dL  IBC PANEL     Status: Abnormal   Collection Time    07/31/13  1:11 PM       Result Value Ref Range   Iron 47  42 - 145 ug/dL   Transferrin 206.8 (*) 212.0 - 360.0 mg/dL   Saturation Ratios 16.2 (*) 20.0 - 50.0 %  HM COLONOSCOPY     Status: None   Collection Time    08/11/13 12:00 AM      Result Value Ref Range   HM Colonoscopy Diverticuli,small, int hems, normal     Comment: Recommend repeat in 5 years.  Upper endoscopy also done  CBC & DIFF AND RETIC     Status: Abnormal   Collection Time    09/01/13  2:52 PM      Result Value Ref Range   WBC 5.4  3.9 - 10.3 10e3/uL   NEUT# 3.0  1.5 - 6.5 10e3/uL   HGB 10.8 (*) 11.6 - 15.9 g/dL   HCT 32.1 (*) 34.8 - 46.6 %   Platelets 182  145 - 400 10e3/uL   MCV 81.5  79.5 - 101.0 fL   MCH 27.4  25.1 - 34.0 pg   MCHC 33.6  31.5 - 36.0 g/dL   RBC 3.94  3.70 - 5.45 10e6/uL   RDW 13.6  11.2 - 14.5 %   lymph# 1.9  0.9 - 3.3 10e3/uL   MONO# 0.3  0.1 - 0.9 10e3/uL   Eosinophils Absolute 0.1  0.0 - 0.5 10e3/uL   Basophils Absolute 0.0  0.0 - 0.1 10e3/uL   NEUT% 56.2  38.4 - 76.8 %   LYMPH% 35.4  14.0 - 49.7 %   MONO% 5.6  0.0 - 14.0 %   EOS% 2.4  0.0 - 7.0 %   BASO% 0.4  0.0 - 2.0 %   Retic % 1.43  0.70 - 2.10 %   Retic Ct Abs 56.34  33.70 - 90.70 10e3/uL   Immature Retic Fract 8.00  1.60 - 10.00 %  VITAMIN B12     Status: None   Collection Time    09/01/13  2:52 PM      Result Value Ref Range   Vitamin B-12 527  211 - 911 pg/mL  IGG, IGA, IGM     Status: Abnormal (Preliminary result)   Collection Time    09/01/13  2:52 PM      Result Value Ref Range   IgG (Immunoglobin G), Serum 1160  690 - 1700 mg/dL   IgA 427 (*) 69 - 380 mg/dL   IgM, Serum 119  52 - 322 mg/dL  COMPREHENSIVE METABOLIC PANEL (0000000)     Status: None   Collection Time    09/01/13  2:52 PM      Result Value Ref Range   Sodium 136  136 - 145 mEq/L  Potassium 4.0  3.5 - 5.1 mEq/L   Chloride 103  98 - 109 mEq/L   CO2 24  22 - 29 mEq/L   Glucose 102  70 - 140 mg/dl   BUN 21.5  7.0 - 26.0 mg/dL   Creatinine 1.1  0.6 - 1.1 mg/dL   Total  Bilirubin 0.46  0.20 - 1.20 mg/dL   Alkaline Phosphatase 86  40 - 150 U/L   AST 17  5 - 34 U/L   ALT 13  0 - 55 U/L   Total Protein 7.5  6.4 - 8.3 g/dL   Albumin 3.7  3.5 - 5.0 g/dL   Calcium 9.5  8.4 - 10.4 mg/dL   Anion Gap 9  3 - 11 mEq/L  [facility] SMEAR     Status: None   Collection Time    09/01/13  2:52 PM      Result Value Ref Range   Smear Result Smear Available    SEDIMENTATION RATE     Status: Abnormal (Preliminary result)   Collection Time    09/01/13  2:52 PM      Result Value Ref Range   Sed Rate 27 (*) 0 - 22 mm/hr   ASSESSMENT & PLAN:  #1 Anemia This is multifactorial, likely a component of anemia of chronic disease. I informed the patient of my preliminary assessment. She wants a phone call to review test results rather than coming back. #2 fatigue The patient is on many different medications that could cause sedation and fatigue. She also has hypothyroidism on replacement therapy. With anemia greater than 10 g, I do not believe that is the cause of her excessive fatigue.  #3 elevated IgA  Serum protein electrophoresis did not detect any abnormal M spike. I will repeat that and will call patient with test results.  All questions were answered. The patient knows to call the clinic with any problems, questions or concerns. I spent 40 minutes counseling the patient face to face. The total time spent in the appointment was 55 minutes and more than 50% was on counseling.     [place], [place], MD 09/02/2013 7:57 AM

## 2013-09-05 ENCOUNTER — Telehealth: Payer: Self-pay | Admitting: Hematology and Oncology

## 2013-09-05 NOTE — Telephone Encounter (Signed)
s.w.l pt and advisedo n July 2016 appt....pt ok adn aware

## 2013-09-11 ENCOUNTER — Encounter: Payer: Self-pay | Admitting: Family Medicine

## 2013-09-11 ENCOUNTER — Ambulatory Visit (INDEPENDENT_AMBULATORY_CARE_PROVIDER_SITE_OTHER): Payer: BC Managed Care – PPO | Admitting: Family Medicine

## 2013-09-11 VITALS — BP 138/86 | HR 64 | Temp 98.0°F | Wt 168.5 lb

## 2013-09-11 DIAGNOSIS — E1149 Type 2 diabetes mellitus with other diabetic neurological complication: Secondary | ICD-10-CM

## 2013-09-11 DIAGNOSIS — Z23 Encounter for immunization: Secondary | ICD-10-CM

## 2013-09-11 NOTE — Progress Notes (Signed)
Pre visit review using our clinic review tool, if applicable. No additional management support is needed unless otherwise documented below in the visit note.  Diabetes:  Using medications without difficulties: yes Hypoglycemic episodes:not recently.  Has only happened twice since dx of DM2.  Hyperglycemic episodes:no Feet problems:at baseline Blood Sugars averaging: all lower than 143, usually ~100 eye exam within last year: due, she is going to call about that.  Off ARB at this point.  Labs d/w pt.  A1c 6.5 in 07/2013.   Caring for her mother, moved in with her.  Mother had a CVA.   She has seen heme re: anemia of chronic disease.  She is going to restart her iron, had been off since the colonoscopy.    BP elevation initially.  Rechecked:  138/86.  We had tapered off BP meds- was on ARB prev.    Meds, vitals, and allergies reviewed.   ROS: See HPI.  Otherwise negative.    GEN: nad, alert and oriented HEENT: mucous membranes moist NECK: supple w/o LA CV: rrr. PULM: ctab, no inc wob ABD: soft, +bs EXT: no edema SKIN: no acute rash

## 2013-09-11 NOTE — Patient Instructions (Addendum)
Call about an eye exam when you get a chance.   Schedule a physical for early 2016.   Take care.  Glad to see you.

## 2013-09-12 NOTE — Assessment & Plan Note (Signed)
Controlled on most recent check, had been off ARB as BP trended down.  Not added back at his point.  Will recheck at a CPE in early 2016.  She is working on diet as much as she can with her current efforts to care for her ill mother.  See instructions.

## 2013-09-26 ENCOUNTER — Telehealth: Payer: Self-pay | Admitting: Emergency Medicine

## 2013-09-26 ENCOUNTER — Ambulatory Visit (INDEPENDENT_AMBULATORY_CARE_PROVIDER_SITE_OTHER): Payer: BC Managed Care – PPO | Admitting: Family Medicine

## 2013-09-26 ENCOUNTER — Encounter: Payer: Self-pay | Admitting: Family Medicine

## 2013-09-26 VITALS — BP 132/80 | HR 75 | Temp 98.0°F | Wt 166.5 lb

## 2013-09-26 DIAGNOSIS — N3942 Incontinence without sensory awareness: Secondary | ICD-10-CM

## 2013-09-26 LAB — POCT URINALYSIS DIPSTICK
Bilirubin, UA: NEGATIVE
Blood, UA: NEGATIVE
Glucose, UA: NEGATIVE
KETONES UA: NEGATIVE
Leukocytes, UA: NEGATIVE
Nitrite, UA: NEGATIVE
PH UA: 6
PROTEIN UA: NEGATIVE
Urobilinogen, UA: 4

## 2013-09-26 MED ORDER — TOLTERODINE TARTRATE 1 MG PO TABS: 1.0000 mg | ORAL_TABLET | Freq: Two times a day (BID) | ORAL | Status: DC

## 2013-09-26 NOTE — Patient Instructions (Signed)
Restart the detrol and we'll be in touch.   We'll request your records from the [facility].  Take care.

## 2013-09-26 NOTE — Progress Notes (Signed)
Pre visit review using our clinic review tool, if applicable. No additional management support is needed unless otherwise documented below in the visit note.  Last night she had urinary incontinence, large void.  Had stopped detrol recently, about 3 weeks ago.  No troubles until last night. No burning with urination.  Slight dysuria today.  No other med changes.  No vomiting, no diarrhea.    She has a R eye posterior hemorrhage, managed by eye clinic.  Requesting records.   Meds, vitals, and allergies reviewed.   ROS: See HPI.  Otherwise, noncontributory.  nad ncat Mmm rrr ctab abd soft, not ttp Ext w/o edema

## 2013-09-28 ENCOUNTER — Other Ambulatory Visit: Payer: Self-pay | Admitting: Family Medicine

## 2013-09-28 DIAGNOSIS — E1149 Type 2 diabetes mellitus with other diabetic neurological complication: Secondary | ICD-10-CM

## 2013-09-28 NOTE — Assessment & Plan Note (Signed)
U/a unremarkable, likely from stopping detrol, now with OAB sx returned.  D/w pt.  Restart detrol.  D/w pt.  rx sent.  She agrees.

## 2013-09-30 ENCOUNTER — Other Ambulatory Visit (INDEPENDENT_AMBULATORY_CARE_PROVIDER_SITE_OTHER): Payer: BC Managed Care – PPO

## 2013-09-30 DIAGNOSIS — E1149 Type 2 diabetes mellitus with other diabetic neurological complication: Secondary | ICD-10-CM

## 2013-09-30 DIAGNOSIS — R7989 Other specified abnormal findings of blood chemistry: Secondary | ICD-10-CM

## 2013-09-30 DIAGNOSIS — E039 Hypothyroidism, unspecified: Secondary | ICD-10-CM

## 2013-09-30 LAB — LIPID PANEL
CHOL/HDL RATIO: 4
Cholesterol: 215 mg/dL — ABNORMAL HIGH (ref 0–200)
HDL: 48.5 mg/dL (ref >39.00)
NonHDL: 166.5
Triglycerides: 246 mg/dL — ABNORMAL HIGH (ref 0.0–149.0)
VLDL: 49.2 mg/dL — AB (ref 0.0–40.0)

## 2013-09-30 LAB — TSH: TSH: 8.33 u[IU]/mL — AB (ref 0.35–4.50)

## 2013-10-01 LAB — LDL CHOLESTEROL, DIRECT: LDL DIRECT: 141.6 mg/dL

## 2013-10-05 ENCOUNTER — Other Ambulatory Visit: Payer: Self-pay | Admitting: Family Medicine

## 2013-10-05 DIAGNOSIS — E039 Hypothyroidism, unspecified: Secondary | ICD-10-CM

## 2013-10-05 MED ORDER — LEVOTHYROXINE SODIUM 175 MCG PO TABS: ORAL_TABLET | ORAL | Status: DC

## 2013-10-31 NOTE — Telephone Encounter (Signed)
error 

## 2013-11-27 ENCOUNTER — Encounter: Payer: Self-pay | Admitting: Family Medicine

## 2013-11-27 ENCOUNTER — Ambulatory Visit (INDEPENDENT_AMBULATORY_CARE_PROVIDER_SITE_OTHER): Payer: BC Managed Care – PPO | Admitting: Family Medicine

## 2013-11-27 VITALS — BP 144/90 | HR 76 | Temp 98.3°F | Wt 168.0 lb

## 2013-11-27 DIAGNOSIS — M6281 Muscle weakness (generalized): Secondary | ICD-10-CM

## 2013-11-27 DIAGNOSIS — R296 Repeated falls: Secondary | ICD-10-CM

## 2013-11-27 DIAGNOSIS — E039 Hypothyroidism, unspecified: Secondary | ICD-10-CM

## 2013-11-27 MED ORDER — ATORVASTATIN CALCIUM 20 MG PO TABS: 20.0000 mg | ORAL_TABLET | Freq: Every day | ORAL | Status: DC

## 2013-11-27 NOTE — Progress Notes (Signed)
Pre visit review using our clinic review tool, if applicable. No additional management support is needed unless otherwise documented below in the visit note.  H/o falls.  No focal weakness, but her B legs will feel weak. No leg aches. No tremor at rest.  Now needing a cane, over the last year. She didn't know if she could attribute her sx to exhaustion from caring from her mother.  Patient has been in PT prev, not recently.  No change in sensation. She isn't clumsy, just weak.  No LOC, no injury with the falls.  No new meds.  On statin chronically.    Urination improved from the last OV, even though she is still off detrol.   H/o B12/iron def, on replacement. Due for f/u TSH, on replacment.   Meds, vitals, and allergies reviewed.   ROS: See HPI.  Otherwise, noncontributory.  GEN: nad, alert and oriented HEENT: mucous membranes moist NECK: supple w/o LA, no tmg noted.  CV: rrr.  PULM: ctab, no inc wob ABD: soft, +bs EXT: no edema SKIN: no acute rash CN 2-12 wnl B, S/S/DTR wnl x4, no fasciculations noted.

## 2013-11-27 NOTE — Patient Instructions (Signed)
Go to the lab on the way out.  We'll contact you with your lab report. Stop the lipitor for now.  We'll get you set up with PT.  Keep using the cane in the meantime.  Take care.

## 2013-11-28 DIAGNOSIS — R296 Repeated falls: Secondary | ICD-10-CM | POA: Insufficient documentation

## 2013-11-28 LAB — CBC WITH DIFFERENTIAL/PLATELET
Basophils Absolute: 0 10*3/uL (ref 0.0–0.1)
Basophils Relative: 0.2 % (ref 0.0–3.0)
EOS PCT: 2.5 % (ref 0.0–5.0)
Eosinophils Absolute: 0.2 10*3/uL (ref 0.0–0.7)
HCT: 33.1 % — ABNORMAL LOW (ref 36.0–46.0)
Hemoglobin: 11.1 g/dL — ABNORMAL LOW (ref 12.0–15.0)
LYMPHS ABS: 2.1 10*3/uL (ref 0.7–4.0)
Lymphocytes Relative: 27.9 % (ref 12.0–46.0)
MCHC: 33.5 g/dL (ref 30.0–36.0)
MCV: 82.5 fl (ref 78.0–100.0)
Monocytes Absolute: 0.4 10*3/uL (ref 0.1–1.0)
Monocytes Relative: 5.4 % (ref 3.0–12.0)
NEUTROS ABS: 4.9 10*3/uL (ref 1.4–7.7)
Neutrophils Relative %: 64 % (ref 43.0–77.0)
PLATELETS: 171 10*3/uL (ref 150.0–400.0)
RBC: 4.01 Mil/uL (ref 3.87–5.11)
RDW: 14.6 % (ref 11.5–15.5)
WBC: 7.6 10*3/uL (ref 4.0–10.5)

## 2013-11-28 LAB — TSH: TSH: 5.11 u[IU]/mL — ABNORMAL HIGH (ref 0.35–4.50)

## 2013-11-28 LAB — CK: CK TOTAL: 52 U/L (ref 7–177)

## 2013-11-28 NOTE — Assessment & Plan Note (Signed)
Unclear source. Stop statin for now.  Refer to PT.  Check basic labs. There isn't be an ominous neuromuscular finding on exam.  She'll update me.  She agrees with the plan.  >25 minutes spent in face to face time with patient, >50% spent in counselling or coordination of care.

## 2013-12-04 ENCOUNTER — Encounter: Payer: Self-pay | Admitting: Family Medicine

## 2013-12-07 ENCOUNTER — Encounter: Payer: Self-pay | Admitting: Family Medicine

## 2013-12-08 ENCOUNTER — Other Ambulatory Visit: Payer: BC Managed Care – PPO

## 2013-12-17 ENCOUNTER — Other Ambulatory Visit: Payer: Self-pay | Admitting: Family Medicine

## 2013-12-31 ENCOUNTER — Telehealth: Payer: Self-pay | Admitting: Family Medicine

## 2013-12-31 NOTE — Telephone Encounter (Signed)
Patient Information:  [name] Name: Somer  Phone: [phone]  Patient: Elizabeth Rivera, Elizabeth Rivera  Gender: Female  DOB: 16-Sep-1950  Age: 63 Years  PCP: Elsie Stain Brigitte Pulse) [facility])  Office Follow Up:  Does the office need to follow up with this patient?: No  Instructions For The Office: N/A  RN Note:  Pt. advised to drink water and rehydrate. Advised to keep a log of the readings while having control issues. Pt. to be very careful with her diet. Will call for any increased blood sugars at the time they are elevated.  Symptoms  Reason For Call & Symptoms: Pt. is having blood sugar control problems over the past 2 weeks. Blood sugars have ranged from 219-419. Blood sugar was 219 last pm(12/30/13) Pt. states her high normal would only be 185. Pt. does not really have good records of the time and dates on the blood sugars. Blood sugar this morning(12/31/13) is 145. Has not been sick. Has had blurred vision and weakness.Pt. is not drinking enough.Pt. takes Metformin 500mg .2 tabs in the am and one in the evening.  Reviewed Health History In EMR: Yes  Reviewed Medications In EMR: Yes  Reviewed Allergies In EMR: Yes  Reviewed Surgeries / Procedures: Yes  Date of Onset of Symptoms: 12/17/2013  Treatments Tried: Metformin  Treatments Tried Worked: Yes  Guideline(s) Used:  Diabetes - High Blood Sugar  Disposition Per Guideline:   Home Care  Reason For Disposition Reached:   Blood glucose 60-240 mg/dl (3.5 -13 mmol/l)  Advice Given:  General  Definition of hyperglycemia: - Fasting blood glucose more than 140 mg/dL (7.5 mmol/l) or random blood glucose more than 200 mg/dL (11 mmol/l).  Symptoms of mild hyperglycemia: frequent urination, increased thirst, fatigue, blurred vision.  Treatment - Liquids  Drink at least one glass (8 oz or 240 ml) of water per hour for the next 4 hours. (Reason: adequate hydration will reduce hyperglycemia).  Generally, you should try to drink 6-8 glasses of  water each day.  Treatment - Diabetes Medications  : Continue taking your diabetes pills.  Measure and Record Your Blood Glucose  Every day you should measure your blood glucose before breakfast and before going to bed.  Record the results and show them to your doctor at your next office visit.  Daily Blood Glucose Goals  Pre-prandial (before meal): 70-130 mg/dL (3.9-7.2 mmol/l)  Post-prandial (2-3 hours after a meal): Less than 180 mg/dL (10 mmol/l)  Expected Course  Your blood sugar continues to get above 240 mg/dl (13 mmol/l).  Your blood sugar continues to be higher than your daily glucose goals (set by you and your doctor).  Call Back If:  Blood glucose more than 300 mg/dL (16.5 mmol/l), 2 or more times in a row.  Vomiting lasting more than 4 hours or unable to drink any liquids.  Rapid breathing occurs  You become worse.  Patient Will Follow Care Advice:  YES

## 2014-01-02 NOTE — Telephone Encounter (Signed)
Noted  

## 2014-01-06 ENCOUNTER — Encounter: Payer: Self-pay | Admitting: Family Medicine

## 2014-01-08 ENCOUNTER — Telehealth: Payer: Self-pay | Admitting: *Deleted

## 2014-01-08 NOTE — Telephone Encounter (Signed)
Received fax from [facility] to reorder pt's DM testing supplies. Her previous order ( last authorized on 01/28/13) is expiring, and needs renewal. Forms placed in your inbox.

## 2014-01-11 NOTE — Telephone Encounter (Signed)
Done, thanks

## 2014-01-12 NOTE — Telephone Encounter (Signed)
Faxed to [facility].

## 2014-01-15 ENCOUNTER — Encounter: Payer: Self-pay | Admitting: Family Medicine

## 2014-01-15 ENCOUNTER — Ambulatory Visit (INDEPENDENT_AMBULATORY_CARE_PROVIDER_SITE_OTHER): Payer: BC Managed Care – PPO | Admitting: Family Medicine

## 2014-01-15 VITALS — BP 142/96 | HR 69 | Temp 97.3°F | Wt 169.0 lb

## 2014-01-15 DIAGNOSIS — E1149 Type 2 diabetes mellitus with other diabetic neurological complication: Secondary | ICD-10-CM

## 2014-01-15 DIAGNOSIS — E039 Hypothyroidism, unspecified: Secondary | ICD-10-CM

## 2014-01-15 DIAGNOSIS — R5383 Other fatigue: Secondary | ICD-10-CM

## 2014-01-15 DIAGNOSIS — E119 Type 2 diabetes mellitus without complications: Secondary | ICD-10-CM

## 2014-01-15 DIAGNOSIS — E1140 Type 2 diabetes mellitus with diabetic neuropathy, unspecified: Secondary | ICD-10-CM

## 2014-01-15 LAB — COMPREHENSIVE METABOLIC PANEL
ALT: 21 U/L (ref 0–35)
AST: 22 U/L (ref 0–37)
Albumin: 4.1 g/dL (ref 3.5–5.2)
Alkaline Phosphatase: 64 U/L (ref 39–117)
BUN: 20 mg/dL (ref 6–23)
CALCIUM: 9.2 mg/dL (ref 8.4–10.5)
CHLORIDE: 97 meq/L (ref 96–112)
CO2: 21 mEq/L (ref 19–32)
CREATININE: 1.2 mg/dL (ref 0.4–1.2)
GFR: 49.83 mL/min — AB (ref >60.00)
GLUCOSE: 151 mg/dL — AB (ref 70–99)
Potassium: 4.1 mEq/L (ref 3.5–5.1)
Sodium: 129 mEq/L — ABNORMAL LOW (ref 135–145)
Total Bilirubin: 0.6 mg/dL (ref 0.2–1.2)
Total Protein: 7.4 g/dL (ref 6.0–8.3)

## 2014-01-15 LAB — CBC WITH DIFFERENTIAL/PLATELET
Basophils Absolute: 0 10*3/uL (ref 0.0–0.1)
Basophils Relative: 0.6 % (ref 0.0–3.0)
EOS PCT: 2.5 % (ref 0.0–5.0)
Eosinophils Absolute: 0.2 10*3/uL (ref 0.0–0.7)
HEMATOCRIT: 34.8 % — AB (ref 36.0–46.0)
Hemoglobin: 11.4 g/dL — ABNORMAL LOW (ref 12.0–15.0)
Lymphocytes Relative: 29.8 % (ref 12.0–46.0)
Lymphs Abs: 2.3 10*3/uL (ref 0.7–4.0)
MCHC: 32.7 g/dL (ref 30.0–36.0)
MCV: 85 fl (ref 78.0–100.0)
MONO ABS: 0.3 10*3/uL (ref 0.1–1.0)
Monocytes Relative: 3.8 % (ref 3.0–12.0)
Neutro Abs: 4.8 10*3/uL (ref 1.4–7.7)
Neutrophils Relative %: 63.3 % (ref 43.0–77.0)
Platelets: 193 10*3/uL (ref 150.0–400.0)
RBC: 4.09 Mil/uL (ref 3.87–5.11)
RDW: 16 % — ABNORMAL HIGH (ref 11.5–15.5)
WBC: 7.6 10*3/uL (ref 4.0–10.5)

## 2014-01-15 LAB — TSH: TSH: 290 u[IU]/mL — AB (ref 0.35–4.50)

## 2014-01-15 LAB — HEMOGLOBIN A1C: HEMOGLOBIN A1C: 9.2 % — AB (ref 4.6–6.5)

## 2014-01-15 MED ORDER — RANITIDINE HCL 300 MG PO TABS: 300.0000 mg | ORAL_TABLET | Freq: Every day | ORAL | Status: DC

## 2014-01-15 MED ORDER — METOCLOPRAMIDE HCL 10 MG PO TABS: 10.0000 mg | ORAL_TABLET | Freq: Three times a day (TID) | ORAL | Status: DC

## 2014-01-15 MED ORDER — IMIPRAMINE HCL 25 MG PO TABS: 25.0000 mg | ORAL_TABLET | Freq: Every day | ORAL | Status: DC

## 2014-01-15 MED ORDER — METOPROLOL SUCCINATE ER 100 MG PO TB24: ORAL_TABLET | ORAL | Status: DC

## 2014-01-15 NOTE — Progress Notes (Signed)
Pre visit review using our clinic review tool, if applicable. No additional management support is needed unless otherwise documented below in the visit note.  She misunderstood her prev instructions and stopped her thyroid medicine.  At that point, she began to have symptoms (progressive in the last 2 months): Tremor, change in balance, fatigue, diffuse weakness, lack of concentration, vision changes, high blood sugars.   Sugar has been up to 419.    Meds, vitals, and allergies reviewed.   ROS: See HPI.  Otherwise, noncontributory.  nad but tired appearing MM slightly dry, not very dry OP wnl o/w Neck supple, no LA rrr ctab abd soft, not ttp Ext w/o edema Speech slow but fluent.

## 2014-01-15 NOTE — Patient Instructions (Signed)
Go to the lab on the way out.  We'll contact you with your lab report. Drink a lot of water in the meantime, enough to keep your urine light colored.  If you have any chest pain or get dehydrated, then go to the ER.  Start back on 1/2 tab of thyroid medicine a day for now.  You can't go back to the full dose yet.  We'll be in touch about increasing the dose.  Take care.  Glad to see you.

## 2014-01-18 NOTE — Assessment & Plan Note (Signed)
Clearly hypothyroid.  Was prev in 132mcg, 1 a day except for 0.5 tab on every other Sunday. 13.5 tabs in 14 days. Off for about 2 months now.   See notes on labs.  I asked her never to stop the thyroid medicine w/o talking to MD about it first. Will start slow taper back up on her dose.  D/w pt.  See AVS.  >25 minutes spent in face to face time with patient, >50% spent in counselling or coordination of care.

## 2014-01-18 NOTE — Assessment & Plan Note (Signed)
See notes on labs. 

## 2014-01-22 ENCOUNTER — Telehealth: Payer: Self-pay | Admitting: Family Medicine

## 2014-01-22 NOTE — Telephone Encounter (Signed)
Pt called back regarding lab results  [phone]

## 2014-02-03 ENCOUNTER — Encounter: Payer: Self-pay | Admitting: Family Medicine

## 2014-02-03 ENCOUNTER — Ambulatory Visit (INDEPENDENT_AMBULATORY_CARE_PROVIDER_SITE_OTHER): Payer: BC Managed Care – PPO | Admitting: Family Medicine

## 2014-02-03 VITALS — BP 162/104 | HR 83 | Temp 98.3°F | Wt 168.5 lb

## 2014-02-03 DIAGNOSIS — E1149 Type 2 diabetes mellitus with other diabetic neurological complication: Secondary | ICD-10-CM

## 2014-02-03 DIAGNOSIS — E039 Hypothyroidism, unspecified: Secondary | ICD-10-CM

## 2014-02-03 DIAGNOSIS — E1140 Type 2 diabetes mellitus with diabetic neuropathy, unspecified: Secondary | ICD-10-CM

## 2014-02-03 MED ORDER — LEVOTHYROXINE SODIUM 175 MCG PO TABS: ORAL_TABLET | ORAL | Status: DC

## 2014-02-03 MED ORDER — METFORMIN HCL 500 MG PO TABS: 500.0000 mg | ORAL_TABLET | Freq: Two times a day (BID) | ORAL | Status: DC

## 2014-02-03 NOTE — Patient Instructions (Addendum)
Stay off the lipitor for now.  Cut the metformin back to 1 pill twice a day.  If you still have low sugars, cut back to 1 pill of metformin a day.  Recheck with me in mid January.  We can do labs at that point.  New thyroid medicine dose in the meantime: starting 02/06/14.  1 tab, 1 tab, then 0.5 tab a day.  Repeat the 3 day cycle.  Take care. Glad to see you.

## 2014-02-03 NOTE — Progress Notes (Signed)
Pre visit review using our clinic review tool, if applicable. No additional management support is needed unless otherwise documented below in the visit note. Patient is not currently taking Lipitor that is on meds list, thinking she was told not to take it.  DM2.  Sugar has been ~90, no highs, 1 low reading recently.  She treated that with a snack.  She was symptomatic.    Hypothyroidism. Still on lower dose of thyroid medicine.  Some tremor- worse after higher dose of thyroid replacement but then better in the last week. She still feels cold.  Prev was on 166mcg day except for 0.5 tab on every other Sunday.  13.5 tabs in 14 days. Currently on 10.5 tabs in 14 days, alternating 1 and 0.5 tab daily.  She is improved overall, more alert.  Family noted improvement.   Meds, vitals, and allergies reviewed.   ROS: See HPI.  Otherwise, noncontributory.  GEN: nad, alert and oriented, much more alert and engaged with conversation compared to last OV.  HEENT: mucous membranes moist NECK: supple w/o LA CV: rrr. PULM: ctab, no inc wob ABD: soft, +bs EXT: no edema SKIN: no acute rash No tremor.

## 2014-02-04 NOTE — Assessment & Plan Note (Signed)
Prev was on 12mcg day except for 0.5 tab on every other Sunday.  13.5 tabs in 14 days. Currently on 10.5 tabs in 14 days, alternating 1 and 0.5 tab daily.  Change to 1-1-0.5, 3 day cycle.  Calendar marked and given to patient.  She agrees.  Recheck and f/u in 02/2014.   >25 minutes spent in face to face time with patient, >50% spent in counselling or coordination of care.

## 2014-02-04 NOTE — Assessment & Plan Note (Signed)
Dec metformin to BID, if any more lows, cut back to once a day.  She can stay off the statin until other med changes are sorted out.  D/w pt.  She agrees.  Med list give to patient.

## 2014-02-10 ENCOUNTER — Ambulatory Visit (INDEPENDENT_AMBULATORY_CARE_PROVIDER_SITE_OTHER): Payer: BLUE CROSS/BLUE SHIELD | Admitting: Family Medicine

## 2014-02-10 ENCOUNTER — Encounter: Payer: Self-pay | Admitting: Family Medicine

## 2014-02-10 VITALS — BP 142/80 | HR 113 | Temp 98.0°F | Wt 163.5 lb

## 2014-02-10 DIAGNOSIS — K088 Other specified disorders of teeth and supporting structures: Secondary | ICD-10-CM

## 2014-02-10 DIAGNOSIS — K0889 Other specified disorders of teeth and supporting structures: Secondary | ICD-10-CM

## 2014-02-10 MED ORDER — CLINDAMYCIN HCL 300 MG PO CAPS: 300.0000 mg | ORAL_CAPSULE | Freq: Three times a day (TID) | ORAL | Status: DC

## 2014-02-10 NOTE — Progress Notes (Signed)
Pre visit review using our clinic review tool, if applicable. No additional management support is needed unless otherwise documented below in the visit note.  Sx started 02/05/14.  Felt hot, likely she had a fever.  Sweats and chills, esp at night, "water running off me."  No ear pain.  No rhinorrhea.  ST noted.  No cough.   Not really having facial pain but had dental pain, she was worried about a dental infection at a L upper molar where she prev had a root canal.  She was to have some dental work but couldn't get it done until the sx above are resolved.   She is on higher dose of thyroid replacement but didn't have a return of the tremor.  She is on lower dose of metformin and tolerating that.   Meds, vitals, and allergies reviewed.   ROS: See HPI.  Otherwise, noncontributory.  nad ncat Tm wnl  Nasal exam wnl OP wnl except for L upper molar ttp with minimal pressure from a qtip. No gum line changes noted locally.  L max sinus ttp.  Neck supple, no LA rrr ctab

## 2014-02-10 NOTE — Patient Instructions (Signed)
Start taking clindamycin and that should help.   Take care. I'll see you later this month.

## 2014-02-11 DIAGNOSIS — K0889 Other specified disorders of teeth and supporting structures: Secondary | ICD-10-CM | POA: Insufficient documentation

## 2014-02-11 NOTE — Assessment & Plan Note (Signed)
W/o TMJ pain.  D/w pt.  Start clinda, will have her f/u with the dental clinic.  She agrees.  Still okay for outpatient f/u.   Her pulse was in the upper 90s on recheck, likely elevated from pain, not from systemic illness.

## 2014-02-12 ENCOUNTER — Ambulatory Visit: Payer: Self-pay | Admitting: Family Medicine

## 2014-02-16 ENCOUNTER — Encounter: Payer: Self-pay | Admitting: Family Medicine

## 2014-02-17 ENCOUNTER — Encounter: Payer: Self-pay | Admitting: *Deleted

## 2014-02-26 ENCOUNTER — Other Ambulatory Visit: Payer: Self-pay | Admitting: Family Medicine

## 2014-02-26 DIAGNOSIS — E1149 Type 2 diabetes mellitus with other diabetic neurological complication: Secondary | ICD-10-CM

## 2014-02-26 DIAGNOSIS — E038 Other specified hypothyroidism: Secondary | ICD-10-CM

## 2014-02-27 ENCOUNTER — Other Ambulatory Visit: Payer: BLUE CROSS/BLUE SHIELD

## 2014-03-03 ENCOUNTER — Ambulatory Visit: Payer: BLUE CROSS/BLUE SHIELD | Admitting: Family Medicine

## 2014-03-03 ENCOUNTER — Other Ambulatory Visit: Payer: Self-pay | Admitting: *Deleted

## 2014-03-03 NOTE — Telephone Encounter (Signed)
Faxed refill request.   Form must be filled out and faxed to pharmacy.  Placed in In Box.

## 2014-03-04 DIAGNOSIS — R350 Frequency of micturition: Secondary | ICD-10-CM | POA: Insufficient documentation

## 2014-03-04 MED ORDER — PREGABALIN 75 MG PO CAPS: ORAL_CAPSULE | ORAL | Status: DC

## 2014-03-04 NOTE — Telephone Encounter (Signed)
Printed.  Thanks.  

## 2014-03-04 NOTE — Telephone Encounter (Signed)
Written prescription faxed back with form as instructed.

## 2014-03-09 DIAGNOSIS — I639 Cerebral infarction, unspecified: Secondary | ICD-10-CM

## 2014-03-09 HISTORY — DX: Cerebral infarction, unspecified: I63.9

## 2014-03-10 ENCOUNTER — Other Ambulatory Visit (INDEPENDENT_AMBULATORY_CARE_PROVIDER_SITE_OTHER): Payer: BLUE CROSS/BLUE SHIELD

## 2014-03-10 DIAGNOSIS — E1149 Type 2 diabetes mellitus with other diabetic neurological complication: Secondary | ICD-10-CM

## 2014-03-10 DIAGNOSIS — E1140 Type 2 diabetes mellitus with diabetic neuropathy, unspecified: Secondary | ICD-10-CM

## 2014-03-10 DIAGNOSIS — E038 Other specified hypothyroidism: Secondary | ICD-10-CM

## 2014-03-10 LAB — HEMOGLOBIN A1C: HEMOGLOBIN A1C: 9.6 % — AB (ref 4.6–6.5)

## 2014-03-10 LAB — TSH: TSH: 49.23 u[IU]/mL — ABNORMAL HIGH (ref 0.35–4.50)

## 2014-03-13 ENCOUNTER — Encounter: Payer: Self-pay | Admitting: Family Medicine

## 2014-03-13 ENCOUNTER — Ambulatory Visit: Payer: BLUE CROSS/BLUE SHIELD | Admitting: Family Medicine

## 2014-03-13 ENCOUNTER — Ambulatory Visit (INDEPENDENT_AMBULATORY_CARE_PROVIDER_SITE_OTHER): Payer: BLUE CROSS/BLUE SHIELD | Admitting: Family Medicine

## 2014-03-13 VITALS — BP 158/90 | HR 69 | Temp 98.1°F | Wt 163.8 lb

## 2014-03-13 DIAGNOSIS — E1140 Type 2 diabetes mellitus with diabetic neuropathy, unspecified: Secondary | ICD-10-CM

## 2014-03-13 DIAGNOSIS — E119 Type 2 diabetes mellitus without complications: Secondary | ICD-10-CM

## 2014-03-13 DIAGNOSIS — E039 Hypothyroidism, unspecified: Secondary | ICD-10-CM

## 2014-03-13 DIAGNOSIS — E1149 Type 2 diabetes mellitus with other diabetic neurological complication: Secondary | ICD-10-CM

## 2014-03-13 MED ORDER — METFORMIN HCL 500 MG PO TABS: 500.0000 mg | ORAL_TABLET | Freq: Two times a day (BID) | ORAL | Status: DC

## 2014-03-13 MED ORDER — LEVOTHYROXINE SODIUM 175 MCG PO TABS: 175.0000 ug | ORAL_TABLET | Freq: Every day | ORAL | Status: DC

## 2014-03-13 NOTE — Patient Instructions (Signed)
Increase the thyroid medicine to 1 pill a day, every day.  Increase to 3 metformin a day for about 1 week, then up to 4 a day thereafter.  If you have GI upset, then back down by 1 metformin pill.  Max 4 metformin in a day.  Update me with your sugar in about 2 weeks.  Recheck labs before a visit in about 3 months. t Take care.  Glad to see you.

## 2014-03-13 NOTE — Progress Notes (Signed)
Pre visit review using our clinic review tool, if applicable. No additional management support is needed unless otherwise documented below in the visit note.  H/o prev controlled DM2 and hypothyroidism, both then uncontrolled with patient stopped her meds, now back on treatment.  D/w pt.  TSH much improved, but not back to normal.  Mood and energy are better, gait is improving, but still not back to normal.  Has tolerated increasing dose of replacement.  TSH d/w pt.  DM2-Sugars now <200 usually, which is improved.  A1c still up.  Has been on 2 metformin a day.  No ADE on meds.  Labs d/w pt.   She has seen Dr. Jacqlyn Larsen with uro in the meantime.    ROS: See HPI, otherwise noncontributory.  Meds, vitals, and allergies reviewed.   nad ncat Mmm Neck supple. No LA rrr ctab abd soft Ext w/o edema.   Ext w/o edema

## 2014-03-15 ENCOUNTER — Encounter: Payer: Self-pay | Admitting: Family Medicine

## 2014-03-15 NOTE — Assessment & Plan Note (Addendum)
Improving, likely with A1c lag, will inc metformin up to 4 tabs a day with routine cautions.  She agrees.  Being off metformin and her sig metabolic effects of skipping thyroid replacement likely caused the sig A1c and this should get better now that she is getting treatment for both.  We'll get her eye exam, foot exam done in time, neither was as important as getting her safely back on treatment.

## 2014-03-15 NOTE — Assessment & Plan Note (Signed)
TSH from ~300---> ~50 now, will inc her to 114mcg per day and recheck in about 3 months.  I expect her to continue to improve, d/w pt.  I don't ever want her to stop her thyroid replacement w/o talking to a doc.  She agrees.

## 2014-04-01 ENCOUNTER — Telehealth: Payer: Self-pay

## 2014-04-01 MED ORDER — SITAGLIPTIN PHOSPHATE 100 MG PO TABS: 100.0000 mg | ORAL_TABLET | Freq: Every day | ORAL | Status: DC

## 2014-04-01 NOTE — Telephone Encounter (Signed)
Patient notified as instructed by telephone and verbalized understanding. 

## 2014-04-01 NOTE — Telephone Encounter (Signed)
I sent some januvia to the pharmacy.  Try to get started on that (in addition to the metformin) and update me in a few days on her sugar and tremor.  It should help.  O/w get OV scheduled in the next few days.   Make sure to drink plenty of fluids in the meantime.  Thanks.

## 2014-04-01 NOTE — Telephone Encounter (Signed)
Pt left v/m;pt was seen on 03/13/14; BS is worse runs high every day and every night;FBS averaging 196-215 and BS averages 396-415 at suppertime and hs.pt taking metformin 500mg  two tabs in morning and evening. pt's legs are sore and pt still has tremors; it is difficult to get up and down and difficulty walking. Pt request [email or web address] [address]

## 2014-04-06 ENCOUNTER — Other Ambulatory Visit: Payer: Self-pay | Admitting: Neurology

## 2014-04-14 ENCOUNTER — Ambulatory Visit: Payer: BLUE CROSS/BLUE SHIELD | Admitting: Family Medicine

## 2014-05-05 ENCOUNTER — Ambulatory Visit (INDEPENDENT_AMBULATORY_CARE_PROVIDER_SITE_OTHER): Payer: BLUE CROSS/BLUE SHIELD | Admitting: Primary Care

## 2014-05-05 ENCOUNTER — Encounter: Payer: Self-pay | Admitting: Primary Care

## 2014-05-05 VITALS — BP 128/84 | HR 80 | Temp 98.7°F | Ht 63.0 in | Wt 158.0 lb

## 2014-05-05 DIAGNOSIS — R251 Tremor, unspecified: Secondary | ICD-10-CM | POA: Diagnosis not present

## 2014-05-05 DIAGNOSIS — R55 Syncope and collapse: Secondary | ICD-10-CM

## 2014-05-05 LAB — BASIC METABOLIC PANEL
BUN: 26 mg/dL — ABNORMAL HIGH (ref 6–23)
CO2: 27 mEq/L (ref 19–32)
CREATININE: 0.84 mg/dL (ref 0.40–1.20)
Calcium: 10 mg/dL (ref 8.4–10.5)
Chloride: 99 mEq/L (ref 96–112)
GFR: 72.97 mL/min (ref >60.00)
GLUCOSE: 157 mg/dL — AB (ref 70–99)
Potassium: 4.2 mEq/L (ref 3.5–5.1)
Sodium: 133 mEq/L — ABNORMAL LOW (ref 135–145)

## 2014-05-05 LAB — CBC WITH DIFFERENTIAL/PLATELET
BASOS ABS: 0 10*3/uL (ref 0.0–0.1)
Basophils Relative: 0.4 % (ref 0.0–3.0)
EOS PCT: 1.1 % (ref 0.0–5.0)
Eosinophils Absolute: 0.1 10*3/uL (ref 0.0–0.7)
HCT: 33.7 % — ABNORMAL LOW (ref 36.0–46.0)
Hemoglobin: 11.3 g/dL — ABNORMAL LOW (ref 12.0–15.0)
LYMPHS ABS: 1.1 10*3/uL (ref 0.7–4.0)
LYMPHS PCT: 14.6 % (ref 12.0–46.0)
MCHC: 33.4 g/dL (ref 30.0–36.0)
MCV: 82.9 fl (ref 78.0–100.0)
MONOS PCT: 3.7 % (ref 3.0–12.0)
Monocytes Absolute: 0.3 10*3/uL (ref 0.1–1.0)
Neutro Abs: 6.1 10*3/uL (ref 1.4–7.7)
Neutrophils Relative %: 80.2 % — ABNORMAL HIGH (ref 43.0–77.0)
Platelets: 205 10*3/uL (ref 150.0–400.0)
RBC: 4.06 Mil/uL (ref 3.87–5.11)
RDW: 13 % (ref 11.5–15.5)
WBC: 7.6 10*3/uL (ref 4.0–10.5)

## 2014-05-05 MED ORDER — METOPROLOL SUCCINATE ER 100 MG PO TB24: 100.0000 mg | ORAL_TABLET | Freq: Every day | ORAL | Status: DC

## 2014-05-05 NOTE — Progress Notes (Signed)
Subjective:    Patient ID: Elizabeth Rivera, female    DOB: 1950-05-17, 63 y.o.   MRN: [id number]  HPI  Ms. Elizabeth Rivera is a 64 year old female who presents today for follow up of syncopal episode that occurred yesterday afternoon. The patient had risen out of her chair after lunch and bent downward to pick up her purse. Upon rising she then became unconscious, fell on the floor, and woke up on the floor with people surrounding her. She was there with her mother who witnessed the entire episode. She denies chest pain, but reports recent episode of palpitations and tremors that have been present for weeks. EMS arrived on scene and checked her blood sugar which was 155. Her BP at the time was 158/98. She did hit her head on a table but denies headache today.  2) Tremors: Have been present for several months. She is recently being treated for an elevated TSH which started in 200's and is now 72. She'll get them at rest and during movement.   3) Fatigue: She's felt fatigued for several months but reports its worsening and that she feels quite sluggish throughout the day. She's currently being treated for an elevated A1C of >9 and an elevated TSH. She's not had any low blood sugars below 130. She does have a history of anemia and is taking an iron supplement. She does not snore or wake up in the middle of the night gasping for air.   Review of Systems  Constitutional: Positive for fatigue. Negative for fever.  HENT: Negative for rhinorrhea.   Respiratory: Negative for cough and shortness of breath.   Cardiovascular: Positive for palpitations. Negative for chest pain.  Gastrointestinal: Positive for nausea.  Genitourinary: Negative for dysuria and frequency.  Skin: Negative for wound.  Neurological: Positive for dizziness. Negative for numbness and headaches.  Psychiatric/Behavioral: Negative for confusion.       Past Medical History  Diagnosis Date  . Thyroid disease     hypothyroid  . Anxiety    . Hyperlipidemia   . Hypertension   . Other chest pain     chest discomfort  . Obesity   . Neuropathy   . Other esophagitis     erosive esophagitis  . Other specified gastritis without mention of hemorrhage     erosvie gastritis  . Pain in limb     Right leg pain  . Other specified disorders of adrenal glands 07/2007    adrenal mass via of CT 1.4cm left Adrenal no change(Dr.Cope)   . Other abnormal blood chemistry   . Diverticulosis of colon (without mention of hemorrhage) 05/20/2009    Internal hemms (Dr. Vira Agar)  . Irritable bowel syndrome     history of  . Insomnia, unspecified   . Nervous breakdown 08/2002    [facility]  . GERD (gastroesophageal reflux disease)     reflux HH 09/01/2002//egd reflux Esoph. HH Gastritis nonbleeding erosive gastropathy Duodentitis 08/15/2006  . Hyperglycemia   . Candidal vaginitis   . Forearm fracture     2013, left  . Anemia   . Anemia of other chronic disease 09/01/2013    History   Social History  . Marital Status: Married    Spouse Name: N/A  . Number of Children: N/A  . Years of Education: N/A   Occupational History  . Not on file.   Social History Main Topics  . Smoking status: Never Smoker   . Smokeless tobacco: Never Used  .  Alcohol Use: No  . Drug Use: No  . Sexual Activity: No   Other Topics Concern  . Not on file   Social History Narrative   No regular exercise.   Worked at [employer] in [place]   Her elderly mother had a CVA and moved in with patient    Past Surgical History  Procedure Laterality Date  . Cholecystectomy  03/2004  . Abdominal hysterectomy  1995    Part Hyst BSO DUB ovarian cyst B9  . Vaginal delivery  1977  . Breast reduction surgery  1984  . Cystoscopy  01/08/2008    Former site of inflammation healed (Dr. Jacqlyn Larsen)  . Nsvd x1  1977  . Esophagogastroduodenoscopy  1. 09/01/02  2. 08/15/06    1. Reflux, HH  2. Reflux esoph HH gaastritis nonbleeding erosive gastropathy duodenitis  .  Colonoscopy  1. 08/15/06  2. 05/20/09    Int Hemms  2. Divertics Int Hemms (Dr. Vira Agar)  . Ct abd w & pelvis wo cm  6/09    CT Scan abd stable adrenal adenoma 1.4cm left adrenal no change (Dr. Jacqlyn Larsen)    Family History  Problem Relation Age of Onset  . Hypertension Mother   . Hyperlipidemia Mother   . Stroke Mother     66  . Vision loss Father     vision problems  . Cancer Father     Colon   . Hyperlipidemia Sister   . Hypertension Sister   . Vision loss Sister     Allergies  Allergen Reactions  . Sulfonamide Derivatives     REACTION: rash  . Sulfa Antibiotics Rash    Current Outpatient Prescriptions on File Prior to Visit  Medication Sig Dispense Refill  . ALPRAZolam (XANAX XR) 1 MG 24 hr tablet Take 2 tablets (2 mg total) by mouth daily. 60 tablet 2  . ALPRAZolam (XANAX) 0.25 MG tablet TAKE 1 TO 2 TABLETS BY MOUTH THREE TIMES DAILY AS NEEDED FOR ANXIETY 30 tablet 2  . BAYER MICROLET LANCETS lancets Ck blood sugar twice a day and as directed. Dx 250.02 200 each 3  . ferrous sulfate 325 (65 FE) MG tablet Take 1 tablet (325 mg total) by mouth daily with breakfast.    . gabapentin (NEURONTIN) 300 MG capsule TAKE 1 CAPSULE BY MOUTH THREE TIMES DAILY 90 capsule 0  . glucose blood (BAYER CONTOUR NEXT TEST) test strip Ck blood sugar twice a day and as directed. Dx 250.02. 200 each 3  . imipramine (TOFRANIL) 25 MG tablet Take 1-2 tablets (25-50 mg total) by mouth at bedtime. 60 tablet 0  . levothyroxine (SYNTHROID, LEVOTHROID) 175 MCG tablet Take 1 tablet (175 mcg total) by mouth daily before breakfast. 90 tablet 3  . metFORMIN (GLUCOPHAGE) 500 MG tablet Take 1-2 tablets (500-1,000 mg total) by mouth 2 (two) times daily with a meal. 360 tablet 3  . metoCLOPramide (REGLAN) 10 MG tablet Take 1 tablet (10 mg total) by mouth 3 (three) times daily. 270 tablet 3  . omeprazole (PRILOSEC) 40 MG capsule Take 40 mg by mouth 3 (three) times daily.     . pregabalin (LYRICA) 75 MG capsule 2 tabs  in AM and 1 in PM. 270 capsule 1  . ranitidine (ZANTAC) 300 MG tablet Take 1 tablet (300 mg total) by mouth daily. 30 tablet 0  . sitaGLIPtin (JANUVIA) 100 MG tablet Take 1 tablet (100 mg total) by mouth daily. 30 tablet 5  . venlafaxine XR (EFFEXOR-XR) 150  MG 24 hr capsule TAKE 1 CAPSULE BY MOUTH TWICE DAILY. 60 capsule 3  . vitamin B-12 (CYANOCOBALAMIN) 1000 MCG tablet Take 1,000 mcg by mouth 2 (two) times daily.       No current facility-administered medications on file prior to visit.    BP 128/84 mmHg  Pulse 80  Temp(Src) 98.7 F (37.1 C) (Oral)  Ht 5\' 3"  (1.6 m)  Wt 158 lb (71.668 kg)  BMI 28.00 kg/m2  SpO2 98%    Objective:   Physical Exam  Constitutional: She is oriented to person, place, and time.  Eyes: EOM are normal. Pupils are equal, round, and reactive to light.  Neck: Neck supple.  Cardiovascular: Normal rate, regular rhythm and intact distal pulses.   Pulmonary/Chest: Effort normal and breath sounds normal.  Lymphadenopathy:    She has no cervical adenopathy.  Neurological: She is alert and oriented to person, place, and time. She has normal reflexes. She displays no tremor. No cranial nerve deficit or sensory deficit. She exhibits normal muscle tone. She displays a negative Romberg sign.  Skin: Skin is warm and dry.  Psychiatric: She has a normal mood and affect.          Assessment & Plan:  Positive orthostatics from laying to sitting/standing. ECG unremarkable and unchanged from prior.

## 2014-05-05 NOTE — Assessment & Plan Note (Addendum)
Bilateral upper extremities. Negative neuro exam. Likely due to elevated TSH that is currently being treated. Will obtain CBC and BMET today. She is to follow up as scheduled in May.

## 2014-05-05 NOTE — Progress Notes (Signed)
Pre visit review using our clinic review tool, if applicable. No additional management support is needed unless otherwise documented below in the visit note. 

## 2014-05-05 NOTE — Patient Instructions (Addendum)
Complete lab work prior to leaving today. We will notify you of any abnormal results. Your syncope was likely due to your Toprol-XL. Decrease your Toprol-XL to one tablet daily starting today. Please follow up with Dr. Damita Dunnings as scheduled in May. Notify us if you develop chest pain with shortness of breath, extreme sweating, or very fast heart rate.  Syncope Syncope is a medical term for fainting or passing out. This means you lose consciousness and drop to the ground. People are generally unconscious for less than 5 minutes. You may have some muscle twitches for up to 15 seconds before waking up and returning to normal. Syncope occurs more often in older adults, but it can happen to anyone. While most causes of syncope are not dangerous, syncope can be a sign of a serious medical problem. It is important to seek medical care.  CAUSES  Syncope is caused by a sudden drop in blood flow to the brain. The specific cause is often not determined. Factors that can bring on syncope include:  Taking medicines that lower blood pressure.  Sudden changes in posture, such as standing up quickly.  Taking more medicine than prescribed.  Standing in one place for too long.  Seizure disorders.  Dehydration and excessive exposure to heat.  Low blood sugar (hypoglycemia).  Straining to have a bowel movement.  Heart disease, irregular heartbeat, or other circulatory problems.  Fear, emotional distress, seeing blood, or severe pain. SYMPTOMS  Right before fainting, you may:  Feel dizzy or light-headed.  Feel nauseous.  See all white or all black in your field of vision.  Have cold, clammy skin. DIAGNOSIS  Your health care provider will ask about your symptoms, perform a physical exam, and perform an electrocardiogram (ECG) to record the electrical activity of your heart. Your health care provider may also perform other heart or blood tests to determine the cause of your syncope which may  include:  Transthoracic echocardiogram (TTE). During echocardiography, sound waves are used to evaluate how blood flows through your heart.  Transesophageal echocardiogram (TEE).  Cardiac monitoring. This allows your health care provider to monitor your heart rate and rhythm in real time.  Holter monitor. This is a portable device that records your heartbeat and can help diagnose heart arrhythmias. It allows your health care provider to track your heart activity for several days, if needed.  Stress tests by exercise or by giving medicine that makes the heart beat faster. TREATMENT  In most cases, no treatment is needed. Depending on the cause of your syncope, your health care provider may recommend changing or stopping some of your medicines. HOME CARE INSTRUCTIONS  Have someone stay with you until you feel stable.  Do not drive, use machinery, or play sports until your health care provider says it is okay.  Keep all follow-up appointments as directed by your health care provider.  Lie down right away if you start feeling like you might faint. Breathe deeply and steadily. Wait until all the symptoms have passed.  Drink enough fluids to keep your urine clear or pale yellow.  If you are taking blood pressure or heart medicine, get up slowly and take several minutes to sit and then stand. This can reduce dizziness. SEEK IMMEDIATE MEDICAL CARE IF:   You have a severe headache.  You have unusual pain in the chest, abdomen, or back.  You are bleeding from your mouth or rectum, or you have black or tarry stool.  You have an irregular or  very fast heartbeat.  You have pain with breathing.  You have repeated fainting or seizure-like jerking during an episode.  You faint when sitting or lying down.  You have confusion.  You have trouble walking.  You have severe weakness.  You have vision problems. If you fainted, call your local emergency services (911 in [place] Do not drive  yourself to the hospital.  MAKE SURE YOU:  Understand these instructions.  Will watch your condition.  Will get help right away if you are not doing well or get worse. Document Released: 01/23/2005 Document Revised: 01/28/2013 Document Reviewed: 03/24/2011 [facility] Patient Information 2015 [place], [place]. This information is not intended to replace advice given to you by your health care provider. Make sure you discuss any questions you have with your health care provider.

## 2014-05-05 NOTE — Assessment & Plan Note (Signed)
Reports this is worsening. Will check CBC to rule out worsening anemia.

## 2014-05-05 NOTE — Assessment & Plan Note (Signed)
Positive orthostatic vitals from laying to sitting/standing. This is likely the cause of her episode yesterday. EKG without changes from prior, no ST elevation. Decrease Toprol XL to once daily. Follow up with Dr. Damita Dunnings as scheduled in May or sooner if symptoms re-occur.

## 2014-05-08 ENCOUNTER — Ambulatory Visit (INDEPENDENT_AMBULATORY_CARE_PROVIDER_SITE_OTHER): Payer: BLUE CROSS/BLUE SHIELD | Admitting: Family Medicine

## 2014-05-08 ENCOUNTER — Encounter: Payer: Self-pay | Admitting: Family Medicine

## 2014-05-08 VITALS — BP 130/90 | HR 80 | Temp 98.8°F | Wt 157.0 lb

## 2014-05-08 DIAGNOSIS — E1140 Type 2 diabetes mellitus with diabetic neuropathy, unspecified: Secondary | ICD-10-CM | POA: Diagnosis not present

## 2014-05-08 DIAGNOSIS — E1149 Type 2 diabetes mellitus with other diabetic neurological complication: Secondary | ICD-10-CM

## 2014-05-08 DIAGNOSIS — E039 Hypothyroidism, unspecified: Secondary | ICD-10-CM | POA: Diagnosis not present

## 2014-05-08 MED ORDER — METFORMIN HCL 500 MG PO TABS: 500.0000 mg | ORAL_TABLET | Freq: Two times a day (BID) | ORAL | Status: DC

## 2014-05-08 NOTE — Patient Instructions (Addendum)
Go to the lab on the way out.  We'll contact you with your lab report (TSH).  Cut the metformin back to 1 tab twice a day.  Call and update me on the diarrhea and your sugars early next week.   Let me know how you are feeling at that point.  The only med change right now is the metformin.  Take care. Glad to see you.

## 2014-05-08 NOTE — Progress Notes (Signed)
Pre visit review using our clinic review tool, if applicable. No additional management support is needed unless otherwise documented below in the visit note.  She has had B leg weakness and tremulousness.    Sugar recently 155.  Diarrhea recently, unclear if from metformin.    She is losing weight, unclear if from diarrhea and thyroid replacement.    She has had diarrhea for years, but I didn't know that was going on until the OV. She hasn't fainted again in the meantime.  Her BB dose was decreased recently. She has chronic L sided abd pain, that waxes and wanes, again longstanding.   "I had so much going on, I didn't want to complain."   Meds, vitals, and allergies reviewed.   ROS: See HPI.  Otherwise, noncontributory.  GEN: nad, alert and oriented HEENT: mucous membranes moist NECK: supple w/o LA CV: rrr. PULM: ctab, no inc wob ABD: soft, +bs, abd not ttp, no rebound EXT: no edema SKIN: no acute rash

## 2014-05-09 LAB — TSH: TSH: 0.046 u[IU]/mL — ABNORMAL LOW (ref 0.350–4.500)

## 2014-05-10 NOTE — Assessment & Plan Note (Signed)
She has a benign abd exam.  Would taper back the metformin, esp with her weight loss and she'll report back if that helps the diarrhea and abd sx.  With her weight loss, she may not need as much metformin.  >25 minutes spent in face to face time with patient, >50% spent in counselling or coordination of care.

## 2014-05-10 NOTE — Assessment & Plan Note (Signed)
See notes on labs.  Now overcorrected.  This could be contributing to tremor and her generalized sx.

## 2014-05-21 ENCOUNTER — Ambulatory Visit (INDEPENDENT_AMBULATORY_CARE_PROVIDER_SITE_OTHER)
Admission: RE | Admit: 2014-05-21 | Discharge: 2014-05-21 | Disposition: A | Discharge status: Home / Self Care | Payer: BLUE CROSS/BLUE SHIELD | Source: Ambulatory Visit | Attending: Family Medicine | Admitting: Family Medicine

## 2014-05-21 ENCOUNTER — Ambulatory Visit (INDEPENDENT_AMBULATORY_CARE_PROVIDER_SITE_OTHER): Payer: BLUE CROSS/BLUE SHIELD | Admitting: Family Medicine

## 2014-05-21 ENCOUNTER — Other Ambulatory Visit: Payer: Self-pay

## 2014-05-21 ENCOUNTER — Encounter: Payer: Self-pay | Admitting: Family Medicine

## 2014-05-21 VITALS — BP 142/76 | HR 75 | Temp 97.8°F | Wt 154.8 lb

## 2014-05-21 DIAGNOSIS — E038 Other specified hypothyroidism: Secondary | ICD-10-CM

## 2014-05-21 DIAGNOSIS — R4182 Altered mental status, unspecified: Secondary | ICD-10-CM

## 2014-05-21 DIAGNOSIS — R251 Tremor, unspecified: Secondary | ICD-10-CM | POA: Diagnosis not present

## 2014-05-21 LAB — COMPREHENSIVE METABOLIC PANEL
ALBUMIN: 3.5 g/dL (ref 3.5–5.2)
ALT: 13 U/L (ref 0–35)
AST: 15 U/L (ref 0–37)
Alkaline Phosphatase: 90 U/L (ref 39–117)
BILIRUBIN TOTAL: 0.6 mg/dL (ref 0.2–1.2)
BUN: 33 mg/dL — ABNORMAL HIGH (ref 6–23)
CALCIUM: 9.7 mg/dL (ref 8.4–10.5)
CO2: 25 mEq/L (ref 19–32)
Chloride: 102 mEq/L (ref 96–112)
Creatinine, Ser: 0.89 mg/dL (ref 0.40–1.20)
GFR: 68.25 mL/min (ref >60.00)
GLUCOSE: 251 mg/dL — AB (ref 70–99)
POTASSIUM: 4.1 meq/L (ref 3.5–5.1)
SODIUM: 133 meq/L — AB (ref 135–145)
Total Protein: 7.2 g/dL (ref 6.0–8.3)

## 2014-05-21 LAB — CBC WITH DIFFERENTIAL/PLATELET
Basophils Absolute: 0 10*3/uL (ref 0.0–0.1)
Basophils Relative: 0.3 % (ref 0.0–3.0)
EOS PCT: 4.2 % (ref 0.0–5.0)
Eosinophils Absolute: 0.2 10*3/uL (ref 0.0–0.7)
HCT: 33 % — ABNORMAL LOW (ref 36.0–46.0)
Hemoglobin: 11.2 g/dL — ABNORMAL LOW (ref 12.0–15.0)
Lymphocytes Relative: 28.4 % (ref 12.0–46.0)
Lymphs Abs: 1.5 10*3/uL (ref 0.7–4.0)
MCHC: 34 g/dL (ref 30.0–36.0)
MCV: 81.7 fl (ref 78.0–100.0)
MONOS PCT: 5.1 % (ref 3.0–12.0)
Monocytes Absolute: 0.3 10*3/uL (ref 0.1–1.0)
Neutro Abs: 3.4 10*3/uL (ref 1.4–7.7)
Neutrophils Relative %: 62 % (ref 43.0–77.0)
PLATELETS: 171 10*3/uL (ref 150.0–400.0)
RBC: 4.04 Mil/uL (ref 3.87–5.11)
RDW: 13.4 % (ref 11.5–15.5)
WBC: 5.4 10*3/uL (ref 4.0–10.5)

## 2014-05-21 MED ORDER — OMEPRAZOLE 40 MG PO CPDR: 40.0000 mg | DELAYED_RELEASE_CAPSULE | Freq: Two times a day (BID) | ORAL | Status: DC

## 2014-05-21 MED ORDER — ALPRAZOLAM 0.25 MG PO TABS
ORAL_TABLET | ORAL | Status: DC
Start: 2014-05-21 — End: 2014-08-27

## 2014-05-21 MED ORDER — ALPRAZOLAM ER 1 MG PO TB24: 2.0000 mg | ORAL_TABLET | Freq: Every day | ORAL | Status: DC

## 2014-05-21 MED ORDER — METFORMIN HCL 500 MG PO TABS: 500.0000 mg | ORAL_TABLET | Freq: Two times a day (BID) | ORAL | Status: DC

## 2014-05-21 NOTE — Patient Instructions (Signed)
Go to the lab on the way out.  We'll contact you with your lab report. Rosaria Ferries will call about your referral. Check your meds at home and let me know if you are out of any of the listed med.

## 2014-05-21 NOTE — Telephone Encounter (Signed)
Metformin and omeprazole sent.  Would cut the omeprazole back to 1 tab BID.   Please call in both xanax rxs.  See lab result.  Note.  Thanks.

## 2014-05-21 NOTE — Telephone Encounter (Signed)
Pt was seen earlier today and pts husband was to cb with any med pt was out of; pt has 7 pills left of alprazolam 1 mg, pt is out of alprazolam 0.25 mg, omeprazole 40 mg, sitagliptin and metformin 500 mg. sitagliptin already has refills available at pharmacy. [place] Is OK to refill?

## 2014-05-21 NOTE — Telephone Encounter (Signed)
Patient advised. Medication phoned to pharmacy.  

## 2014-05-21 NOTE — Progress Notes (Signed)
Pre visit review using our clinic review tool, if applicable. No additional management support is needed unless otherwise documented below in the visit note.'  Had been doing well with anxiety and DM until she had stopped her thyroid and Dm2 meds prev.  Since then, we have been trying to re-equilibrate her dosing and TSH/sugar.  She has had more anxiety and tremors (episodically, in the ext x4) recently.  "I've been nervous" and she had been stating that to others.  She has been out of xanax, but I just found out about this today.   See following phone note.    She has been more forgetful recently.  We had to adjust her thyroid medicine downward after the last OV.  She has been compliant o/w.    She had an episode where she fell at [facility] on Monday.  EMS was called but she didn't need transport to hospital.  She isn't going to drive for now, she agrees to that.    She still has some abd pain, on the L side.  That is chronic and she attributes it to a spastic colon.  No blood in stool.  The abd pain has been going on for years. She has seen GI, as recently as last year.  Noted that her diarrhea is better on the lower dose of metfomin.    She has slower speech today, over the last few days.  She isn't oriented to year, but knows that it is April  (this is not acute, has been like this for a few days per family).  PMH and SH reviewed  ROS: See HPI, otherwise noncontributory.  Meds, vitals, and allergies reviewed.   GEN: nad, alert and oriented to month but not year, speech is fluent but slow for her, compared to prev HEENT: mucous membranes moist NECK: supple w/o LA CV: rrr.  PULM: ctab, no inc wob ABD: soft, +bs EXT: no edema SKIN: no acute rash CN 2-12 wnl B, S/S grossly intact x4

## 2014-05-24 ENCOUNTER — Encounter: Payer: Self-pay | Admitting: Family Medicine

## 2014-05-24 NOTE — Assessment & Plan Note (Signed)
She is clinically worse in the last few days.   At this point, check CT head- negative for acute changes.   Labs are okay except for elevated sugar. Getting back on her DM2 meds should help- see phone note.  The only think that I can think of that would be causing her new sx would be the thyroid medicine. If she hasn't tolerated the re-increase of the dose previously, it could be causing some of her sx.  I would stop the thyroid medicine now and not take it over the weekend.  I want her to stop it for the next 4 days and update me Monday with an OV scheduled for Monday. If she is some better, then we'll likely restart her thyroid medicine at a lower dose. If not better, we'll restart the medicine and make other plans, depending on her clinical presentation.  >25 minutes spent in face to face time with patient, >50% spent in counselling or coordination of care.

## 2014-05-24 NOTE — Assessment & Plan Note (Signed)
No tremor noted on exam today, see below.

## 2014-05-25 ENCOUNTER — Ambulatory Visit (INDEPENDENT_AMBULATORY_CARE_PROVIDER_SITE_OTHER): Payer: BLUE CROSS/BLUE SHIELD | Admitting: Family Medicine

## 2014-05-25 ENCOUNTER — Encounter: Payer: Self-pay | Admitting: Family Medicine

## 2014-05-25 VITALS — BP 166/88 | HR 73 | Temp 97.7°F | Wt 154.0 lb

## 2014-05-25 DIAGNOSIS — E1149 Type 2 diabetes mellitus with other diabetic neurological complication: Secondary | ICD-10-CM

## 2014-05-25 DIAGNOSIS — F459 Somatoform disorder, unspecified: Secondary | ICD-10-CM | POA: Diagnosis not present

## 2014-05-25 DIAGNOSIS — R413 Other amnesia: Secondary | ICD-10-CM

## 2014-05-25 DIAGNOSIS — E1140 Type 2 diabetes mellitus with diabetic neuropathy, unspecified: Secondary | ICD-10-CM

## 2014-05-25 DIAGNOSIS — R41843 Psychomotor deficit: Secondary | ICD-10-CM | POA: Insufficient documentation

## 2014-05-25 NOTE — Assessment & Plan Note (Signed)
She was doing well until she had stopped her thyroid replacement and DM meds prev.  We have worked to restart those effectively, but she is clearly worse in the meantime, recently but not acutely.  Would check MRI brain and will have her set back up with neuro.  D/w pt and family, all agree.  This is an atypical presentation and I am still not sure about her etiology.  She doesn't look to have a parkinson's termor on the exam today.  I need and appreciate neuro input.  >25 minutes spent in face to face time with patient, >50% spent in counselling or coordination of care. >25 minutes spent in face to face time with patient, >50% spent in counselling or coordination of care.

## 2014-05-25 NOTE — Assessment & Plan Note (Signed)
Family to check on meds that are covered instead of [place] and will update me.  Still on januvia for now.

## 2014-05-25 NOTE — Patient Instructions (Signed)
Elizabeth Rivera will call about your referral for the MRI and neurology.  See her on the way out.  Don't change your meds for now.   See what options are covered to replace the januvia and let me know.  Take care.  Glad to see you.

## 2014-05-25 NOTE — Progress Notes (Signed)
Pre visit review using our clinic review tool, if applicable. No additional management support is needed unless otherwise documented below in the visit note.  We have it on record that her xanax rxs were called in.  Never received per pharmacy.  I called both in at the [facility].    Here for fu.  Still falling occ, stumbling.  Her legs feel jumpy and she has noted a tremor.  Her short term memory has been worse recently.  We talked about skipping a few days of her thyroid replacement since she was overreplaced but she was afraid to do so.  No new focal weakness but a generalized psychomotor slowing noted by patient and family, with gait speech and fine motor work.  CT had no acute changes.    Meds, vitals, and allergies reviewed.   ROS: See HPI.  Otherwise, noncontributory.  GEN: nad, alert and oriented HEENT: mucous membranes moist NECK: supple w/o LA CV: rrr. PULM: ctab, no inc wob ABD: soft, +bs EXT: no edema SKIN: no acute rash CN 2-12 wnl B, S/S/DTR wnl x4 but slowing of gait speech and fine motor skills noted on exam.

## 2014-05-27 ENCOUNTER — Ambulatory Visit
Admission: RE | Admit: 2014-05-27 | Discharge: 2014-05-27 | Disposition: A | Discharge status: Home / Self Care | Payer: BLUE CROSS/BLUE SHIELD | Source: Ambulatory Visit | Attending: Family Medicine | Admitting: Family Medicine

## 2014-05-27 DIAGNOSIS — R413 Other amnesia: Secondary | ICD-10-CM

## 2014-05-28 ENCOUNTER — Telehealth: Payer: Self-pay | Admitting: Family Medicine

## 2014-05-28 NOTE — Telephone Encounter (Signed)
PLEASE NOTE: All timestamps contained within this report are represented as [place] Standard Time. CONFIDENTIALTY NOTICE: This fax transmission is intended only for the addressee. It contains information that is legally privileged, confidential or otherwise protected from use or disclosure. If you are not the intended recipient, you are strictly prohibited from reviewing, disclosing, copying using or disseminating any of this information or taking any action in reliance on or regarding this information. If you have received this fax in error, please notify us immediately by telephone so that we can arrange for its return to [place]. Phone: [phone], Toll-Free: [phone], Fax: [phone] Page: 1 of 1 Call Id: [id number] [place] Patient Name: Elizabeth Rivera Gender: Female DOB: September 18, 1950 Age: 64 Y 3 M 5 D Return Phone Number: Address: City/State/Zip: [place] Client [place] Night - Client Client Site [place] Physician Renford Dills Contact Type Call Call Type Page Only [name] Name Suanne Marker Relationship To Patient Provider Is this call to report lab results? No Return Phone Number Unavailable Initial Comment [name] states she is calling to give head MR results. [name] is Suanne Marker from [facility]. Return phone # is [phone]. Nurse Assessment Guidelines Guideline Title Affirmed Question Affirmed Notes Nurse Date/Time (Eastern Time) Disp. Time Eilene Ghazi Time) Disposition Final User 05/27/2014 6:59:57 PM Send to [place], [place] 05/27/2014 7:10:30 PM Send to Oblong, Mary Ann 05/27/2014 7:32:46 PM Send to [place], [place] 05/27/2014 8:00:44 PM Page Completed Yes Byrd Hesselbach After Care Instructions Given Call Event Type User Date / Time Description Comments User: Audrea Muscat,  [place] Date/Time Eilene Ghazi Time): 05/27/2014 7:49:23 PM There was no on call schedule and no on call physician listed - checked the pdf - there was not an on call dr listed for this practice. Had the back up check his emails to see if the practice had sent an on call schedule - they had not. - Called Rhonda back - was going to tell her to call the office in the morning if the results were not urgent, - she stated that Dr Damita Dunnings had called in and she had given him the results.

## 2014-05-28 NOTE — Telephone Encounter (Signed)
I had already called.  Please talk to me about this.  Thanks.

## 2014-05-29 ENCOUNTER — Encounter: Payer: Self-pay | Admitting: Neurology

## 2014-05-29 ENCOUNTER — Ambulatory Visit (INDEPENDENT_AMBULATORY_CARE_PROVIDER_SITE_OTHER): Payer: BLUE CROSS/BLUE SHIELD | Admitting: Neurology

## 2014-05-29 VITALS — BP 140/68 | HR 68 | Resp 16 | Ht 62.0 in | Wt 155.9 lb

## 2014-05-29 DIAGNOSIS — W19XXXA Unspecified fall, initial encounter: Secondary | ICD-10-CM

## 2014-05-29 DIAGNOSIS — I639 Cerebral infarction, unspecified: Secondary | ICD-10-CM

## 2014-05-29 DIAGNOSIS — R29818 Other symptoms and signs involving the nervous system: Secondary | ICD-10-CM

## 2014-05-29 DIAGNOSIS — R4189 Other symptoms and signs involving cognitive functions and awareness: Secondary | ICD-10-CM | POA: Diagnosis not present

## 2014-05-29 DIAGNOSIS — E039 Hypothyroidism, unspecified: Secondary | ICD-10-CM

## 2014-05-29 NOTE — Patient Instructions (Addendum)
1.  Start aspirin 81mg  daily 2.  Stop Reglan (metoclopramide) 3.  Check fasting lipid panel 4.  Check 2D echo, carotid doppler Zacarias Pontes  [facility] Main Entrance  06/01/14 12:30pm  5.  Follow up in 3 months and recheck cognitive testing

## 2014-05-29 NOTE — Progress Notes (Signed)
NEUROLOGY FOLLOW UP OFFICE NOTE  Adelmira Mckinsey OH:[id number]  HISTORY OF PRESENT ILLNESS: Elizabeth Rivera is a 64 year old right handed woman with anxiety, type 2 diabetes mellitus, hypothyroidism, hypertension and hyperlipidemia whom I previously saw for peripheral neuropathy and now returns for tremor and memory changes.  Records, labs, CT and MRI of head reviewed.  I previously saw her for numbness in the feet and hands.  She has a NCV-EMG performed in July which revealed on evidence of neuropathy.  She did not follow up afterwards.  She had previously controlled diabetes and hypothyroidism, but she discontinued therapy about 5 or 6 months ago due to miscommunication.  At that time, she began to experience extreme fatigue and generalized weakness.  She began to find it difficulty to walk and maneuver.  She began to fall.  She was unable to lift herself up from the floor if she fell.  Due to muscle weakness, she was advised to stop statin.  CK was normal.  About 4 months ago, her TSH was 290 and she was restarted on thyroid therapy.  Her blood sugars were ranging 200s to 400s.  She developed symptoms of possible overcorrected management.  Recent TSH was 0.046.  She began feeling tremulous.  She was also losing weight and experiencing diarrhea.  For nausea, she was prescribed Reglan.  She has been taking it daily since December, even though she is no longer nauseous.  He also began having increased tremor.  She also started noticing memory problems too.  It became significantly worse last Monday after she had a fall in the Parking [place].  She is unable to sometimes recognize or remember names of extended family such as cousins.  Memory has improved.  Since December, she has been shuffling and seems to be slower in regards to processing information and performing tasks.  She requires assistance with ADLs such as getting dress, due to both weakness as well as apraxia.     She had a CT of the head  performed on 05/21/14, which showed chronic small vessel ischemic changes.  She had an MRI of the brain performed on 05/27/14 which showed punctate acute right occipital infarct.  Chronic small vessel disease with remote lacunar infarcts in the right lentiform nucleus as well as old microhemorrhages note in the cerebellum.  Her daughter says that many years ago, the patient had a "nervous breakdown" where she could not recall events for a particular period of time.      Hgb A1c was found to be 9.2 in December and 9.6 in February.    PAST MEDICAL HISTORY: Past Medical History  Diagnosis Date  . Thyroid disease     hypothyroid  . Anxiety   . Hyperlipidemia   . Hypertension   . Other chest pain     chest discomfort  . Obesity   . Neuropathy   . Other esophagitis     erosive esophagitis  . Other specified gastritis without mention of hemorrhage     erosvie gastritis  . Pain in limb     Right leg pain  . Other specified disorders of adrenal glands 07/2007    adrenal mass via of CT 1.4cm left Adrenal no change(Dr.Cope)   . Other abnormal blood chemistry   . Diverticulosis of colon (without mention of hemorrhage) 05/20/2009    Internal hemms (Dr. Vira Agar)  . Irritable bowel syndrome     history of  . Insomnia, unspecified   . Nervous breakdown  08/2002    Hospital [facility]  . GERD (gastroesophageal reflux disease)     reflux HH 09/01/2002//egd reflux Esoph. HH Gastritis nonbleeding erosive gastropathy Duodentitis 08/15/2006  . Hyperglycemia   . Candidal vaginitis   . Forearm fracture     2013, left  . Anemia   . Anemia of other chronic disease 09/01/2013    MEDICATIONS: Current Outpatient Prescriptions on File Prior to Visit  Medication Sig Dispense Refill  . ALPRAZolam (XANAX XR) 1 MG 24 hr tablet Take 2 tablets (2 mg total) by mouth daily. 60 tablet 2  . ALPRAZolam (XANAX) 0.25 MG tablet TAKE 1 TO 2 TABLETS BY MOUTH THREE TIMES DAILY AS NEEDED FOR ANXIETY 30 tablet 2  . BAYER  MICROLET LANCETS lancets Ck blood sugar twice a day and as directed. Dx 250.02 200 each 3  . ferrous sulfate 325 (65 FE) MG tablet Take 1 tablet (325 mg total) by mouth daily with breakfast.    . gabapentin (NEURONTIN) 300 MG capsule TAKE 1 CAPSULE BY MOUTH THREE TIMES DAILY 90 capsule 0  . glucose blood (BAYER CONTOUR NEXT TEST) test strip Ck blood sugar twice a day and as directed. Dx 250.02. 200 each 3  . imipramine (TOFRANIL) 25 MG tablet Take 1-2 tablets (25-50 mg total) by mouth at bedtime. 60 tablet 0  . levothyroxine (SYNTHROID, LEVOTHROID) 175 MCG tablet Take 1 tablet (175 mcg total) by mouth daily before breakfast. 90 tablet 3  . metFORMIN (GLUCOPHAGE) 500 MG tablet Take 1 tablet (500 mg total) by mouth 2 (two) times daily with a meal. 180 tablet 1  . metoprolol succinate (TOPROL XL) 100 MG 24 hr tablet Take 1 tablet (100 mg total) by mouth daily. 30 tablet 3  . omeprazole (PRILOSEC) 40 MG capsule Take 1 capsule (40 mg total) by mouth 2 (two) times daily. 180 capsule 1  . pregabalin (LYRICA) 75 MG capsule 2 tabs in AM and 1 in PM. 270 capsule 1  . ranitidine (ZANTAC) 300 MG tablet Take 1 tablet (300 mg total) by mouth daily. 30 tablet 0  . sitaGLIPtin (JANUVIA) 100 MG tablet Take 1 tablet (100 mg total) by mouth daily. 30 tablet 5  . venlafaxine XR (EFFEXOR-XR) 150 MG 24 hr capsule TAKE 1 CAPSULE BY MOUTH TWICE DAILY. 60 capsule 3  . vitamin B-12 (CYANOCOBALAMIN) 1000 MCG tablet Take 1,000 mcg by mouth 2 (two) times daily.       No current facility-administered medications on file prior to visit.    ALLERGIES: Allergies  Allergen Reactions  . Sulfonamide Derivatives     REACTION: rash  . Sulfa Antibiotics Rash    FAMILY HISTORY: Family History  Problem Relation Age of Onset  . Hypertension Mother   . Hyperlipidemia Mother   . Stroke Mother     60  . Vision loss Father     vision problems  . Cancer Father     Colon   . Hyperlipidemia Sister   . Hypertension Sister     . Vision loss Sister     SOCIAL HISTORY: History   Social History  . Marital Status: Married    Spouse Name: N/A  . Number of Children: N/A  . Years of Education: N/A   Occupational History  . Not on file.   Social History Main Topics  . Smoking status: Never Smoker   . Smokeless tobacco: Never Used  . Alcohol Use: No  . Drug Use: No  . Sexual Activity: No   Other  Topics Concern  . Not on file   Social History Narrative   No regular exercise.   Worked at [employer] in [place]   Her elderly mother had a CVA and moved in with patient    REVIEW OF SYSTEMS: Constitutional: No fevers, chills, or sweats, no generalized fatigue, change in appetite Eyes: No visual changes, double vision, eye pain Ear, nose and throat: No hearing loss, ear pain, nasal congestion, sore throat Cardiovascular: No chest pain, palpitations Respiratory:  No shortness of breath at rest or with exertion, wheezes GastrointestinaI: No nausea, vomiting, diarrhea, abdominal pain, fecal incontinence Genitourinary:  No dysuria, urinary retention or frequency Musculoskeletal:  No neck pain, back pain Integumentary: No rash, pruritus, skin lesions Neurological: as above Psychiatric: No depression, insomnia, anxiety Endocrine: No palpitations, fatigue, diaphoresis, mood swings, change in appetite, change in weight, increased thirst Hematologic/Lymphatic:  No anemia, purpura, petechiae. Allergic/Immunologic: no itchy/runny eyes, nasal congestion, recent allergic reactions, rashes  PHYSICAL EXAM: Filed Vitals:   05/29/14 1412  BP: 140/68  Pulse: 68  Resp: 16   General: No acute distress Head:  Normocephalic/atraumatic Eyes:  Fundoscopic exam unremarkable without vessel changes, exudates, hemorrhages or papilledema. Neck: supple, no paraspinal tenderness, full range of motion Heart:  Regular rate and rhythm Lungs:  Clear to auscultation bilaterally Back: No paraspinal tenderness Neurological  Exam: alert and oriented to person, place, and time (except date and year). Attention span and concentration impaired, delayed recall poor, remote memory intact, fund of knowledge intact.  Speech fluent and not dysarthric, language intact.   Montreal Cognitive Assessment  05/29/2014  Visuospatial/ Executive (0/5) 1  Naming (0/3) 2  Attention: Read list of digits (0/2) 2  Attention: Read list of letters (0/1) 1  Attention: Serial 7 subtraction starting at 100 (0/3) 0  Language: Repeat phrase (0/2) 0  Language : Fluency (0/1) 1  Abstraction (0/2) 2  Delayed Recall (0/5) 0  Orientation (0/6) 4  Total 13  Adjusted Score (based on education) 14   CN II-XII intact. Fundoscopic exam unremarkable without vessel changes, exudates, hemorrhages or papilledema.  Bulk and tone normal, muscle strength 5/5 throughout.  Slowed movements.  No tremor noted.  Sensation to light touch, temperature and vibration intact.  Deep tendon reflexes 2+ throughout, toes downgoing.  Finger to nose testing intact.  Gait with flexed posture, decreased arm swing and short stride.  Requires several steps to turn.  Unable to walk in tandem.  Romberg negative.  IMPRESSION: 1.  Punctate right occipital infarct.  Incidental finding. 2.  Cognitive impairment.  Difficult to assess given that her thyroid is still being stabilized.   3.  Extrapyramidal symptoms, namely bradykinesia.  No rigidity or tremor noted.  She did not walk like this when I saw her last year.  Consider side effects of Reglan which she has continued to take daily.  Family feels these symptoms have been more prominent since this time.  PLAN: 1.  Start ASA 81mg  daily 2.  Check fasting lipid panel.  Will likely need to restart statin 3.  Check 2D echo and carotid doppler 4.  Stop Reglan.  May need to consider discontinuing other medication at some point (imipramine, Lyrica, gabapentin, Effexor) 5.  Reassess cognition (MOCA) in 3 months.  Metta Clines, DO  CC:   Elsie Stain, MD

## 2014-06-01 ENCOUNTER — Ambulatory Visit (HOSPITAL_COMMUNITY)
Admission: RE | Admit: 2014-06-01 | Discharge: 2014-06-01 | Disposition: A | Discharge status: Home / Self Care | Payer: BLUE CROSS/BLUE SHIELD | Source: Ambulatory Visit | Attending: Neurology | Admitting: Neurology

## 2014-06-01 ENCOUNTER — Other Ambulatory Visit (HOSPITAL_COMMUNITY): Payer: Self-pay | Admitting: Neurology

## 2014-06-01 DIAGNOSIS — R29818 Other symptoms and signs involving the nervous system: Secondary | ICD-10-CM

## 2014-06-01 DIAGNOSIS — I6523 Occlusion and stenosis of bilateral carotid arteries: Secondary | ICD-10-CM | POA: Insufficient documentation

## 2014-06-01 DIAGNOSIS — E039 Hypothyroidism, unspecified: Secondary | ICD-10-CM | POA: Diagnosis not present

## 2014-06-01 DIAGNOSIS — I639 Cerebral infarction, unspecified: Secondary | ICD-10-CM | POA: Insufficient documentation

## 2014-06-01 DIAGNOSIS — W19XXXA Unspecified fall, initial encounter: Secondary | ICD-10-CM

## 2014-06-01 DIAGNOSIS — R4189 Other symptoms and signs involving cognitive functions and awareness: Secondary | ICD-10-CM

## 2014-06-01 DIAGNOSIS — I6789 Other cerebrovascular disease: Secondary | ICD-10-CM | POA: Diagnosis not present

## 2014-06-01 NOTE — Progress Notes (Signed)
  Echocardiogram 2D Echocardiogram has been performed.  Donata Clay 06/01/2014, 1:53 PM

## 2014-06-01 NOTE — Progress Notes (Signed)
VASCULAR LAB PRELIMINARY  PRELIMINARY  PRELIMINARY  PRELIMINARY  Carotid Duplex completed.    Preliminary report: RT ICA/CCA ratio: 1.03  Right ICA velocities and ratio within the 1-39% range of stenosis.                                LT ICA/CCA ratio: 1.47  Left ICA velocities and ratio within the upper limits of the 1-39% range of stenosis.                                Vertebral arteries are patent and antegrade bilaterally.  August Albino, RVT 06/01/2014, 5:52 PM

## 2014-06-02 ENCOUNTER — Telehealth: Payer: Self-pay | Admitting: *Deleted

## 2014-06-02 NOTE — Telephone Encounter (Signed)
-----   Message from Pieter Partridge, DO sent at 06/01/2014  7:22 PM EDT ----- There are no changes on the echocardiogram that would warrant changing current management. ----- Message -----    From: [employer]    Sent: 06/01/2014   3:05 PM      To: Pieter Partridge, DO

## 2014-06-02 NOTE — Telephone Encounter (Signed)
Left message for patient to return call to office regarding 2D echo There are no changes on the echocardiogram that would warrant changing current management.

## 2014-06-03 ENCOUNTER — Emergency Department
Admit: 2014-06-03 | Disposition: A | Discharge status: Skilled Nursing Facility | Payer: Self-pay | Admitting: Emergency Medicine

## 2014-06-03 ENCOUNTER — Telehealth: Payer: Self-pay | Admitting: *Deleted

## 2014-06-03 LAB — URINALYSIS, COMPLETE
Bacteria: NONE SEEN
Bilirubin,UR: NEGATIVE
Blood: NEGATIVE
Ketone: NEGATIVE
NITRITE: NEGATIVE
PH: 6 (ref 4.5–8.0)
PROTEIN: NEGATIVE
RBC,UR: NONE SEEN /HPF (ref 0–5)
Specific Gravity: 1.01 (ref 1.003–1.030)

## 2014-06-03 LAB — BASIC METABOLIC PANEL
ANION GAP: 8 (ref 7–16)
BUN: 15 mg/dL
CHLORIDE: 100 mmol/L — AB
Calcium, Total: 9.2 mg/dL
Co2: 28 mmol/L
Creatinine: 0.71 mg/dL
Glucose: 323 mg/dL — ABNORMAL HIGH
POTASSIUM: 3.5 mmol/L
SODIUM: 136 mmol/L

## 2014-06-03 LAB — CBC WITH DIFFERENTIAL/PLATELET
BASOS PCT: 0.1 %
Basophil #: 0 10*3/uL (ref 0.0–0.1)
EOS PCT: 5.3 %
Eosinophil #: 0.3 10*3/uL (ref 0.0–0.7)
HCT: 31.1 % — ABNORMAL LOW (ref 35.0–47.0)
HGB: 10.4 g/dL — ABNORMAL LOW (ref 12.0–16.0)
LYMPHS PCT: 29.4 %
Lymphocyte #: 1.5 10*3/uL (ref 1.0–3.6)
MCH: 27.5 pg (ref 26.0–34.0)
MCHC: 33.6 g/dL (ref 32.0–36.0)
MCV: 82 fL (ref 80–100)
MONO ABS: 0.4 x10 3/mm (ref 0.2–0.9)
MONOS PCT: 7.5 %
Neutrophil #: 3 10*3/uL (ref 1.4–6.5)
Neutrophil %: 57.7 %
PLATELETS: 135 10*3/uL — AB (ref 150–440)
RBC: 3.81 10*6/uL (ref 3.80–5.20)
RDW: 13.9 % (ref 11.5–14.5)
WBC: 5.2 10*3/uL (ref 3.6–11.0)

## 2014-06-03 LAB — TROPONIN I: Troponin-I: <0.03 ng/mL

## 2014-06-03 NOTE — Telephone Encounter (Signed)
-----   Message from Pieter Partridge, DO sent at 06/03/2014  7:47 AM EDT ----- No significant narrowing seen in the arteries in her neck. ----- Message -----    From: [employer]    Sent: 06/02/2014   6:36 PM      To: Pieter Partridge, DO

## 2014-06-03 NOTE — Telephone Encounter (Signed)
Patient is aware of results from both test

## 2014-06-04 ENCOUNTER — Other Ambulatory Visit: Payer: BLUE CROSS/BLUE SHIELD

## 2014-06-04 ENCOUNTER — Ambulatory Visit: Payer: BLUE CROSS/BLUE SHIELD | Admitting: Family Medicine

## 2014-06-11 ENCOUNTER — Ambulatory Visit: Payer: BLUE CROSS/BLUE SHIELD | Admitting: Family Medicine

## 2014-06-12 ENCOUNTER — Encounter
Admission: RE | Admit: 2014-06-12 | Discharge: 2014-06-12 | Disposition: A | Discharge status: Home / Self Care | Payer: BLUE CROSS/BLUE SHIELD | Source: Ambulatory Visit | Attending: Internal Medicine | Admitting: Internal Medicine

## 2014-06-29 ENCOUNTER — Telehealth: Payer: Self-pay

## 2014-06-29 MED ORDER — FLUCONAZOLE 150 MG PO TABS: 150.0000 mg | ORAL_TABLET | Freq: Once | ORAL | Status: DC

## 2014-06-29 NOTE — Telephone Encounter (Signed)
Patient notified by telephone that script has been script to the pharmacy. Patient stated that she was at [facility] and she stated that she will call them and have them fax the records and discharge over to Dr. Damita Dunnings.

## 2014-06-29 NOTE — Telephone Encounter (Signed)
Pt left v/m; for last 3 weeks pt has been in rehab and pt cannot drive; pt request diflucan to [facility] [address] pharmacy; pt has not taken abx; pt having yellow vaginal discharge with itching vaginally and perineal itching as well. Pt thinks due to changing soap at rehab caused problem.Please advise.

## 2014-06-29 NOTE — Telephone Encounter (Signed)
Diflucan sent.  Thanks.   Please try to get records from rehab re: discharge.

## 2014-07-01 NOTE — Telephone Encounter (Signed)
Gregary Signs at [facility] stated that she will fax over the records requested by Dr. Damita Dunnings.

## 2014-07-02 NOTE — Telephone Encounter (Signed)
Thanks. I'll review the papers.

## 2014-07-02 NOTE — Telephone Encounter (Signed)
Paperwork from [employer] is on your desk.

## 2014-07-09 ENCOUNTER — Encounter: Payer: Self-pay | Admitting: Family Medicine

## 2014-07-09 ENCOUNTER — Telehealth: Payer: Self-pay | Admitting: Family Medicine

## 2014-07-09 NOTE — Telephone Encounter (Signed)
Elizabeth Rivera @ [facility] called she  Wants to get verbal order to add  more visit to Elizabeth Rivera cert period

## 2014-07-10 NOTE — Telephone Encounter (Signed)
Please give the order.  Thanks.   

## 2014-07-10 NOTE — Telephone Encounter (Signed)
Elizabeth Rivera with [facility] advised.

## 2014-07-11 DIAGNOSIS — F419 Anxiety disorder, unspecified: Secondary | ICD-10-CM | POA: Diagnosis not present

## 2014-07-11 DIAGNOSIS — E1140 Type 2 diabetes mellitus with diabetic neuropathy, unspecified: Secondary | ICD-10-CM | POA: Diagnosis not present

## 2014-07-11 DIAGNOSIS — I10 Essential (primary) hypertension: Secondary | ICD-10-CM | POA: Diagnosis not present

## 2014-07-11 DIAGNOSIS — E039 Hypothyroidism, unspecified: Secondary | ICD-10-CM | POA: Diagnosis not present

## 2014-07-15 DIAGNOSIS — E1149 Type 2 diabetes mellitus with other diabetic neurological complication: Secondary | ICD-10-CM | POA: Insufficient documentation

## 2014-07-15 DIAGNOSIS — E7800 Pure hypercholesterolemia, unspecified: Secondary | ICD-10-CM | POA: Insufficient documentation

## 2014-07-15 DIAGNOSIS — E039 Hypothyroidism, unspecified: Secondary | ICD-10-CM | POA: Insufficient documentation

## 2014-07-15 DIAGNOSIS — E1165 Type 2 diabetes mellitus with hyperglycemia: Secondary | ICD-10-CM

## 2014-07-15 DIAGNOSIS — IMO0002 Reserved for concepts with insufficient information to code with codable children: Secondary | ICD-10-CM | POA: Insufficient documentation

## 2014-07-15 DIAGNOSIS — F325 Major depressive disorder, single episode, in full remission: Secondary | ICD-10-CM | POA: Insufficient documentation

## 2014-07-20 ENCOUNTER — Telehealth: Payer: Self-pay

## 2014-07-20 NOTE — Telephone Encounter (Signed)
Esther with OT with [facility] left v/m to let Dr Damita Dunnings know that pt received OT evaluation; this is FYI for PCP no cb needed.

## 2014-07-20 NOTE — Telephone Encounter (Signed)
Noted. Thanks.

## 2014-07-29 ENCOUNTER — Telehealth: Payer: Self-pay

## 2014-07-29 NOTE — Telephone Encounter (Signed)
Pt states she is changing PCP's.

## 2014-08-27 ENCOUNTER — Other Ambulatory Visit: Payer: Self-pay | Admitting: Family Medicine

## 2014-08-27 ENCOUNTER — Other Ambulatory Visit: Payer: BC Managed Care – PPO

## 2014-08-27 NOTE — Telephone Encounter (Signed)
Electronic refill request. Last Filled:    30 tablet 2 RF on 05/21/2014  Please advise.

## 2014-08-27 NOTE — Telephone Encounter (Signed)
Please call in.  Thanks.   

## 2014-08-27 NOTE — Telephone Encounter (Signed)
Medication phoned to pharmacy.  

## 2014-08-28 ENCOUNTER — Ambulatory Visit (INDEPENDENT_AMBULATORY_CARE_PROVIDER_SITE_OTHER): Payer: BLUE CROSS/BLUE SHIELD | Admitting: Neurology

## 2014-08-28 ENCOUNTER — Encounter: Payer: Self-pay | Admitting: Neurology

## 2014-08-28 ENCOUNTER — Other Ambulatory Visit: Payer: Self-pay | Admitting: *Deleted

## 2014-08-28 VITALS — BP 140/82 | HR 68 | Resp 16 | Ht 62.0 in | Wt 147.5 lb

## 2014-08-28 DIAGNOSIS — R258 Other abnormal involuntary movements: Secondary | ICD-10-CM | POA: Diagnosis not present

## 2014-08-28 DIAGNOSIS — R4189 Other symptoms and signs involving cognitive functions and awareness: Secondary | ICD-10-CM

## 2014-08-28 DIAGNOSIS — I679 Cerebrovascular disease, unspecified: Secondary | ICD-10-CM | POA: Diagnosis not present

## 2014-08-28 NOTE — Patient Instructions (Addendum)
1.  We will check B12 and fasting lipid panel. Your provider has requested that you have labwork completed when you are fasting. Please go to [facility] on the second floor of this building ([address]) after stopping by our office to get on the lab schedule when fasting. You do not need to check in. If you are not called within 15 minutes please check with the front desk.  2.  We will refer you for Neurocognitive testing at [facility]. You have been referred to Dr Nathanial Rancher for Neuro Psych testing. They will call you directly to schedule an appointment. Please call [phone] if you do not hear from them.  3.  Follow up after the testing.

## 2014-08-28 NOTE — Progress Notes (Signed)
NEUROLOGY FOLLOW UP OFFICE NOTE  Elizabeth Rivera OH:[id number]  HISTORY OF PRESENT ILLNESS: Elizabeth Rivera is a 64 year old right handed woman with anxiety, type 2 diabetes mellitus, hypothyroidism, hypertension and hyperlipidemia who follows up for bradykinesia and memory changes.  Report of echo and carotid doppler reviewed.  She is accompanied by her husband who provides some history.  UPDATE: Last visit, it was advised to wait until her thyroid was stabilized before further evaluation.  Regarding the bradykinesia, she was advised to discontinue Reglan.  She had experienced some falls, so she went to rehab and had other medications discontinued, such as omeprazole and Zantac.  Her alprazolam was decreased to only 0.25mg  daily.  She does feel a little better.  Memory has improved somewhat.  She sometimes misplaces objects.  She performs all of her ADLs.  She reports that she cannot recall seeing me last visit with her daughter.    MRI showed incidental punctate infarct.  2D echo showed EF 60-65% with grade 1 diastolic dysfunction.  Carotid doppler showed no hemodynamically significant ICA stenosis.  Vertebral arteries were patent.  HISTORY: She had previously controlled diabetes and hypothyroidism, but she discontinued therapy about 5 or 6 months ago due to miscommunication.  At that time, she began to experience extreme fatigue and generalized weakness.  She began to find it difficulty to walk and maneuver.  She began to fall.  She was unable to lift herself up from the floor if she fell.  Due to muscle weakness, she was advised to stop statin.  CK was normal.  About 4 months ago, her TSH was 290 and she was restarted on thyroid therapy and aggressively treated.  Most recent TSH from April was low (0.046).  Her blood sugars were ranging 200s to 400s.  She developed symptoms of possible overcorrected management.  Recent TSH was 0.046.  She began feeling tremulous.  She was also losing weight and  experiencing diarrhea.  For nausea, she was prescribed Reglan.  She has been taking it daily since December, even though she is no longer nauseous.  He also began having increased tremor.  She also started noticing memory problems too.  She began having falls.  One time she fell in a parking lot.  She is unable to sometimes recognize or remember names of extended family such as cousins.  Memory has improved.  Since December, she has been shuffling and seems to be slower in regards to processing information and performing tasks.  She requires assistance with ADLs such as getting dress, due to both weakness as well as apraxia.     She had a CT of the head performed on 05/21/14, which showed chronic small vessel ischemic changes.  She had an MRI of the brain performed on 05/27/14 which showed punctate acute right occipital infarct.  Chronic small vessel disease with remote lacunar infarcts in the right lentiform nucleus as well as old microhemorrhages note in the cerebellum.  Her daughter says that many years ago, the patient had a "nervous breakdown" where she could not recall events for a particular period of time.     PAST MEDICAL HISTORY: Past Medical History  Diagnosis Date  . Thyroid disease     hypothyroid  . Anxiety   . Hyperlipidemia   . Hypertension   . Other chest pain     chest discomfort  . Obesity   . Neuropathy   . Other esophagitis     erosive esophagitis  .  Other specified gastritis without mention of hemorrhage     erosvie gastritis  . Pain in limb     Right leg pain  . Other specified disorders of adrenal glands 07/2007    adrenal mass via of CT 1.4cm left Adrenal no change(Dr.Cope)   . Other abnormal blood chemistry   . Diverticulosis of colon (without mention of hemorrhage) 05/20/2009    Internal hemms (Dr. Vira Agar)  . Irritable bowel syndrome     history of  . Insomnia, unspecified   . Nervous breakdown 08/2002    [facility]  . GERD (gastroesophageal reflux  disease)     reflux HH 09/01/2002//egd reflux Esoph. HH Gastritis nonbleeding erosive gastropathy Duodentitis 08/15/2006  . Hyperglycemia   . Candidal vaginitis   . Forearm fracture     2013, left  . Anemia   . Anemia of other chronic disease 09/01/2013    MEDICATIONS: Current Outpatient Prescriptions on File Prior to Visit  Medication Sig Dispense Refill  . ALPRAZolam (XANAX XR) 1 MG 24 hr tablet Take 2 tablets (2 mg total) by mouth daily. (Patient not taking: Reported on 08/28/2014) 60 tablet 2  . ALPRAZolam (XANAX) 0.25 MG tablet TAKE 1 TO 2 TABLETS BY MOUTH THREE TIMES DAILY AS NEEDED FOR ANXIETY 30 tablet 2  . BAYER MICROLET LANCETS lancets Ck blood sugar twice a day and as directed. Dx 250.02 200 each 3  . ferrous sulfate 325 (65 FE) MG tablet Take 1 tablet (325 mg total) by mouth daily with breakfast.    . fluconazole (DIFLUCAN) 150 MG tablet Take 1 tablet (150 mg total) by mouth once. (Patient not taking: Reported on 08/28/2014) 1 tablet 0  . gabapentin (NEURONTIN) 300 MG capsule TAKE 1 CAPSULE BY MOUTH THREE TIMES DAILY (Patient not taking: Reported on 08/28/2014) 90 capsule 0  . glucose blood (BAYER CONTOUR NEXT TEST) test strip Ck blood sugar twice a day and as directed. Dx 250.02. 200 each 3  . imipramine (TOFRANIL) 25 MG tablet Take 1-2 tablets (25-50 mg total) by mouth at bedtime. 60 tablet 0  . levothyroxine (SYNTHROID, LEVOTHROID) 175 MCG tablet Take 1 tablet (175 mcg total) by mouth daily before breakfast. 90 tablet 3  . metFORMIN (GLUCOPHAGE) 500 MG tablet Take 1 tablet (500 mg total) by mouth 2 (two) times daily with a meal. 180 tablet 1  . metoprolol succinate (TOPROL XL) 100 MG 24 hr tablet Take 1 tablet (100 mg total) by mouth daily. 30 tablet 3  . omeprazole (PRILOSEC) 40 MG capsule Take 1 capsule (40 mg total) by mouth 2 (two) times daily. (Patient not taking: Reported on 08/28/2014) 180 capsule 1  . pregabalin (LYRICA) 75 MG capsule 2 tabs in AM and 1 in PM. 270 capsule 1   . ranitidine (ZANTAC) 300 MG tablet Take 1 tablet (300 mg total) by mouth daily. (Patient not taking: Reported on 08/28/2014) 30 tablet 0  . sitaGLIPtin (JANUVIA) 100 MG tablet Take 1 tablet (100 mg total) by mouth daily. 30 tablet 5  . venlafaxine XR (EFFEXOR-XR) 150 MG 24 hr capsule TAKE 1 CAPSULE BY MOUTH TWICE DAILY. 60 capsule 3  . vitamin B-12 (CYANOCOBALAMIN) 1000 MCG tablet Take 1,000 mcg by mouth 2 (two) times daily.       No current facility-administered medications on file prior to visit.    ALLERGIES: Allergies  Allergen Reactions  . Sulfonamide Derivatives     REACTION: rash  . Sulfa Antibiotics Rash    FAMILY HISTORY: Family History  Problem Relation Age of Onset  . Hypertension Mother   . Hyperlipidemia Mother   . Stroke Mother     54  . Vision loss Father     vision problems  . Cancer Father     Colon   . Hyperlipidemia Sister   . Hypertension Sister   . Vision loss Sister     SOCIAL HISTORY: History   Social History  . Marital Status: Married    Spouse Name: N/A  . Number of Children: N/A  . Years of Education: N/A   Occupational History  . Not on file.   Social History Main Topics  . Smoking status: Never Smoker   . Smokeless tobacco: Never Used  . Alcohol Use: No  . Drug Use: No  . Sexual Activity: No   Other Topics Concern  . Not on file   Social History Narrative   No regular exercise.   Worked at [employer] in [place]   Her elderly mother had a CVA and moved in with patient    REVIEW OF SYSTEMS: Constitutional: No fevers, chills, or sweats, no generalized fatigue, change in appetite Eyes: No visual changes, double vision, eye pain Ear, nose and throat: No hearing loss, ear pain, nasal congestion, sore throat Cardiovascular: No chest pain, palpitations Respiratory:  No shortness of breath at rest or with exertion, wheezes GastrointestinaI: No nausea, vomiting, diarrhea, abdominal pain, fecal incontinence Genitourinary:  No  dysuria, urinary retention or frequency Musculoskeletal:  No neck pain, back pain Integumentary: No rash, pruritus, skin lesions Neurological: as above Psychiatric: Anxiety Endocrine: No palpitations, fatigue, diaphoresis, mood swings, change in appetite, change in weight, increased thirst Hematologic/Lymphatic:  No anemia, purpura, petechiae. Allergic/Immunologic: no itchy/runny eyes, nasal congestion, recent allergic reactions, rashes  PHYSICAL EXAM: Filed Vitals:   08/28/14 1430  BP: 140/82  Pulse: 68  Resp: 16   General: No acute distress.   Head:  Normocephalic/atraumatic Eyes:  Fundoscopic exam unremarkable without vessel changes, exudates, hemorrhages or papilledema. Neck: supple, no paraspinal tenderness, full range of motion Heart:  Regular rate and rhythm Lungs:  Clear to auscultation bilaterally Back: No paraspinal tenderness Neurological Exam: alert and oriented to person, place, and time. Attention span and concentration intact, delayed recall poor, remote memory intact, fund of knowledge intact.  Speech fluent and not dysarthric, language intact.   Montreal Cognitive Assessment  08/28/2014 05/29/2014  Visuospatial/ Executive (0/5) 3 1  Naming (0/3) 2 2  Attention: Read list of digits (0/2) 2 2  Attention: Read list of letters (0/1) 1 1  Attention: Serial 7 subtraction starting at 100 (0/3) 3 0  Language: Repeat phrase (0/2) 1 0  Language : Fluency (0/1) 1 1  Abstraction (0/2) 2 2  Delayed Recall (0/5) 0 0  Orientation (0/6) 6 4  Total 21 13  Adjusted Score (based on education) 22 14   Masked facies.  CN II-XII intact. Fundoscopic exam unremarkable without vessel changes, exudates, hemorrhages or papilledema.  Bulk and tone normal, muscle strength 5/5 throughout.  Sensation to light touch intact.  Deep tendon reflexes 2+ throughout except absent in ankles.  Bilateral bradykinesia.  No tremor or rigidity.  Finger to nose and heel to shin testing intact.  Gait still  with shuffle but faster stride and picking up feet better, several steps to turn, decreased arm swing, Romberg negative.  IMPRESSION: Cognitive impairment.  Etiology unclear but could be multifactorial (vascular, metabolic, anxiety-related) Cerebrovascular disease Bradykinesia.  She exhibits no other parkinsonian features.  PLAN: 1.  We will check B12 and fasting lipid panel.  2.  We will refer you for Neurocognitive testing 3.  Continue ASA 81mg  daily 4.  Follow up after the testing.  Metta Clines, DO  CC: Frazier Richards, MD

## 2014-09-01 ENCOUNTER — Other Ambulatory Visit
Admission: RE | Admit: 2014-09-01 | Discharge: 2014-09-01 | Disposition: A | Discharge status: Home / Self Care | Payer: BLUE CROSS/BLUE SHIELD | Source: Ambulatory Visit | Attending: Neurology | Admitting: Neurology

## 2014-09-01 DIAGNOSIS — R4189 Other symptoms and signs involving cognitive functions and awareness: Secondary | ICD-10-CM | POA: Insufficient documentation

## 2014-09-01 LAB — VITAMIN B12: Vitamin B-12: 2735 pg/mL — ABNORMAL HIGH (ref 180–914)

## 2014-09-03 ENCOUNTER — Ambulatory Visit: Payer: BC Managed Care – PPO | Admitting: Hematology and Oncology

## 2014-09-03 LAB — LIPID PANEL
Cholesterol: 232 mg/dL — ABNORMAL HIGH (ref 0–200)
HDL: 57 mg/dL (ref >40)
LDL CALC: 151 mg/dL — AB (ref 0–99)
TRIGLYCERIDES: 120 mg/dL (ref <150)
Total CHOL/HDL Ratio: 4.1 RATIO
VLDL: 24 mg/dL (ref 0–40)

## 2014-09-07 ENCOUNTER — Telehealth: Payer: Self-pay | Admitting: *Deleted

## 2014-09-07 NOTE — Telephone Encounter (Signed)
-----   Message from Cameron Sprang, MD sent at 09/02/2014 10:16 AM EDT ----- Pls let her know B12 level looks good. Thanks

## 2014-09-07 NOTE — Telephone Encounter (Signed)
Patient is aware of normal B12

## 2014-09-07 NOTE — Telephone Encounter (Signed)
Patient is awarethat her cholesterol is elevated and I would recommend that she start pravastatin 40mg  daily. Side effects include muscle aches, but most people tolerate it well. However patient does not want to start pravastatin she states her Dr took her off this kind of medication as it did not mix well with all her other medications.

## 2014-09-07 NOTE — Telephone Encounter (Signed)
-----   Message from Alda Berthold, DO sent at 09/03/2014  2:54 PM EDT ----- Please inform patient that her cholesterol is elevated and I would recommend that she start pravastatin 40mg  daily.  Side effects include muscle aches, but most people tolerate it well.  Thanks.

## 2014-09-09 ENCOUNTER — Encounter: Payer: Self-pay | Admitting: Family Medicine

## 2014-09-09 ENCOUNTER — Ambulatory Visit (INDEPENDENT_AMBULATORY_CARE_PROVIDER_SITE_OTHER)
Admission: RE | Admit: 2014-09-09 | Discharge: 2014-09-09 | Disposition: A | Discharge status: Home / Self Care | Payer: BLUE CROSS/BLUE SHIELD | Source: Ambulatory Visit | Attending: Family Medicine | Admitting: Family Medicine

## 2014-09-09 ENCOUNTER — Ambulatory Visit (INDEPENDENT_AMBULATORY_CARE_PROVIDER_SITE_OTHER): Payer: BLUE CROSS/BLUE SHIELD | Admitting: Family Medicine

## 2014-09-09 VITALS — BP 128/80 | HR 76 | Temp 98.4°F | Wt 146.5 lb

## 2014-09-09 DIAGNOSIS — M79641 Pain in right hand: Secondary | ICD-10-CM

## 2014-09-09 DIAGNOSIS — F411 Generalized anxiety disorder: Secondary | ICD-10-CM

## 2014-09-09 DIAGNOSIS — S62318A Displaced fracture of base of other metacarpal bone, initial encounter for closed fracture: Secondary | ICD-10-CM

## 2014-09-09 DIAGNOSIS — E1140 Type 2 diabetes mellitus with diabetic neuropathy, unspecified: Secondary | ICD-10-CM

## 2014-09-09 DIAGNOSIS — E1149 Type 2 diabetes mellitus with other diabetic neurological complication: Secondary | ICD-10-CM

## 2014-09-09 DIAGNOSIS — M79643 Pain in unspecified hand: Secondary | ICD-10-CM | POA: Insufficient documentation

## 2014-09-09 MED ORDER — OMEPRAZOLE 40 MG PO CPDR: 40.0000 mg | DELAYED_RELEASE_CAPSULE | Freq: Two times a day (BID) | ORAL | Status: DC

## 2014-09-09 MED ORDER — GLUCOSE BLOOD VI STRP: ORAL_STRIP | Status: DC

## 2014-09-09 MED ORDER — PREGABALIN 75 MG PO CAPS: ORAL_CAPSULE | ORAL | Status: DC

## 2014-09-09 MED ORDER — BAYER MICROLET LANCETS MISC: Status: DC

## 2014-09-09 NOTE — Patient Instructions (Addendum)
Get back on the iron.  You can get it over the counter.   Cut back on the insulin.   If your AM sugar is 79 or below, then take away 1 unit of insulin.   If AM sugar is between 80 and 120, then no change in the dose.  If AM sugar is 121 or above, then add a unit.   Recheck your labs in about 1 month before a visit.  I would hold off on the cholesterol medicine for now, until we can get your sugar straightened out.  Ibuprofen 400mg  up to 3 times a day with food for pain.  Use the splint and ice on your hand for pain.  Rosaria Ferries will call about your referral.

## 2014-09-09 NOTE — Assessment & Plan Note (Addendum)
Overtreated, will adjust her insulin based on AM sugars.  Dec 1 unit if Am sugar <80; inc if >120 inc 1 unit.  If AM sugar is between 80 and 120, then no change in the dose.  She agrees.  Update me as needed. Recheck labs before a visit in about 1 month. Hold off on statin start for now.  Lipid labs d/w pt.  >40 minutes spent in face to face time with patient, >50% spent in counselling or coordination of care.

## 2014-09-09 NOTE — Progress Notes (Signed)
Pre visit review using our clinic review tool, if applicable. No additional management support is needed unless otherwise documented below in the visit note.  To recap: now done with rehab at [facility].  Now with PT at home.   She has been having frequent low sugars, down to the 50s.  Occ in the 70s.   She is clearly symptomatic with the episodes.  She improves with taking a snack, when she has an episode.   Compliant with meds.    Neuro notes reviewed.  She has f/u with neurocog testing pending.   Lipid elevation noted, statin start was deferred given the other issues today at the [facility].  D/w pt.    [place] R hand, tripped last week.  No LOC.  Pain along R palm and dorsum, at the R 5th.    Meds, vitals, and allergies reviewed.   ROS: See HPI.  Otherwise, noncontributory.  nad ncat Mmm Neck supple, no LA rrr ctab  abd soft Normal ROM R and and wrist but R 5th MC ttp and R distal radius tender.  Normal radial pulse and distally NV intact.

## 2014-09-09 NOTE — Assessment & Plan Note (Signed)
Likely prox 5th MT fx with recent fall.  D/w pt.  Images reviewd, d/w pt.  She feels better in a cock up splint.  Refer to ortho in the meantime. See AVS.  She agrees.

## 2014-09-09 NOTE — Progress Notes (Signed)
Prescription for Lyrica faxed to mail order pharmacy as instructed.

## 2014-09-09 NOTE — Assessment & Plan Note (Signed)
Improved, now on lower dose of xanax.  Doing well with anxiety, will await neurocog testing.

## 2014-09-21 ENCOUNTER — Telehealth: Payer: Self-pay | Admitting: *Deleted

## 2014-09-21 NOTE — Telephone Encounter (Signed)
Received paper work to be completed for prior authorization for T/S [employer] Next Stp. On your desk for completion.

## 2014-09-22 ENCOUNTER — Telehealth: Payer: Self-pay | Admitting: Family Medicine

## 2014-09-22 NOTE — Telephone Encounter (Signed)
Faxed, awaiting response.

## 2014-09-22 NOTE — Telephone Encounter (Signed)
Elizabeth Rivera the home nurse called to let us know that pt had a fall yesterday. It was without injury, just a little sore.

## 2014-09-22 NOTE — Telephone Encounter (Signed)
Form done. Thanks. 

## 2014-09-23 NOTE — Telephone Encounter (Signed)
Spoke to patient and was advised that she did not hurt herself when she fell and does not feel that she needs to be seen. Patient stated that her regular nurse with [facility] is Debbie at [phone]  Called and spoke to [place] and advised her to continue with fall precautions with the patient and she verbalized understanding.

## 2014-09-23 NOTE — Telephone Encounter (Signed)
Noted, eval if needed.  O/w continue with fall precautions.

## 2014-09-23 NOTE — Telephone Encounter (Signed)
Unable to call Elizabeth Rivera back because of no return telephone number listed. Left message on voicemail for patient to call back.

## 2014-09-28 ENCOUNTER — Other Ambulatory Visit (INDEPENDENT_AMBULATORY_CARE_PROVIDER_SITE_OTHER): Payer: BLUE CROSS/BLUE SHIELD

## 2014-09-28 DIAGNOSIS — E119 Type 2 diabetes mellitus without complications: Secondary | ICD-10-CM | POA: Diagnosis not present

## 2014-09-28 LAB — MICROALBUMIN / CREATININE URINE RATIO
Creatinine,U: 70 mg/dL
Microalb Creat Ratio: 7 mg/g (ref 0.0–30.0)
Microalb, Ur: 4.9 mg/dL — ABNORMAL HIGH (ref 0.0–1.9)

## 2014-09-28 LAB — BASIC METABOLIC PANEL
BUN: 18 mg/dL (ref 6–23)
CO2: 27 meq/L (ref 19–32)
Calcium: 9.4 mg/dL (ref 8.4–10.5)
Chloride: 105 mEq/L (ref 96–112)
Creatinine, Ser: 0.74 mg/dL (ref 0.40–1.20)
GFR: 84.36 mL/min (ref >60.00)
GLUCOSE: 136 mg/dL — AB (ref 70–99)
POTASSIUM: 4.1 meq/L (ref 3.5–5.1)
Sodium: 140 mEq/L (ref 135–145)

## 2014-09-28 LAB — HEMOGLOBIN A1C: Hgb A1c MFr Bld: 7.3 % — ABNORMAL HIGH (ref 4.6–6.5)

## 2014-09-28 NOTE — Telephone Encounter (Signed)
PA denied, denial letter placed in your inbox.

## 2014-10-01 NOTE — Telephone Encounter (Signed)
Denial noted.  rx printed for strips, she can take that to pharmacy to get a covered set of strips.

## 2014-10-01 NOTE — Telephone Encounter (Signed)
.  left message left on cell phone to have patient return my call, no answer at home number.  It looks like on the paperwork that they will cover accu-chek or one touch, need to know which one pt would prefer.

## 2014-10-02 ENCOUNTER — Ambulatory Visit (INDEPENDENT_AMBULATORY_CARE_PROVIDER_SITE_OTHER): Payer: BLUE CROSS/BLUE SHIELD | Admitting: Family Medicine

## 2014-10-02 ENCOUNTER — Encounter: Payer: Self-pay | Admitting: Family Medicine

## 2014-10-02 VITALS — BP 152/92 | HR 72 | Temp 97.9°F | Wt 147.0 lb

## 2014-10-02 DIAGNOSIS — E119 Type 2 diabetes mellitus without complications: Secondary | ICD-10-CM | POA: Diagnosis not present

## 2014-10-02 DIAGNOSIS — E039 Hypothyroidism, unspecified: Secondary | ICD-10-CM

## 2014-10-02 DIAGNOSIS — E1140 Type 2 diabetes mellitus with diabetic neuropathy, unspecified: Secondary | ICD-10-CM

## 2014-10-02 DIAGNOSIS — E1149 Type 2 diabetes mellitus with other diabetic neurological complication: Secondary | ICD-10-CM

## 2014-10-02 DIAGNOSIS — Z119 Encounter for screening for infectious and parasitic diseases, unspecified: Secondary | ICD-10-CM

## 2014-10-02 MED ORDER — INSULIN GLARGINE 100 UNIT/ML SOLOSTAR PEN: 10.0000 [IU] | PEN_INJECTOR | Freq: Every day | SUBCUTANEOUS | Status: DC

## 2014-10-02 MED ORDER — VENLAFAXINE HCL ER 150 MG PO CP24: 150.0000 mg | ORAL_CAPSULE | Freq: Two times a day (BID) | ORAL | Status: DC

## 2014-10-02 MED ORDER — LISINOPRIL 2.5 MG PO TABS: 2.5000 mg | ORAL_TABLET | Freq: Every day | ORAL | Status: DC

## 2014-10-02 MED ORDER — METFORMIN HCL 1000 MG PO TABS: 1000.0000 mg | ORAL_TABLET | Freq: Two times a day (BID) | ORAL | Status: DC

## 2014-10-02 MED ORDER — ALPRAZOLAM 0.25 MG PO TABS: ORAL_TABLET | ORAL | Status: DC

## 2014-10-02 NOTE — Progress Notes (Signed)
Pre visit review using our clinic review tool, if applicable. No additional management support is needed unless otherwise documented below in the visit note.  Diabetes:  Using medications without difficulties: Hypoglycemic episodes: occ, with prolonged fasting, down to the 20s.  On lower dose of insulin now.  She didn't notify me re: the low sugars until today.   Hyperglycemic episodes: no Feet problems:no Blood Sugars averaging: usually 80-120 now.   eye exam within last year: due, d/w pt.   MALB up, A1c improved. D/w pt about ACE start for MALB.   She opts in for HIV and HCV screening with the next set of labs.    R wrist pain improved in brace.  Still in brace.    Hypothyroidism.  Will be due for TSH with next A1c.  Compliant with meds.  No missed doses.   PMH and SH reviewed  Meds, vitals, and allergies reviewed.   ROS: See HPI.  Otherwise negative.    GEN: nad, alert and oriented HEENT: mucous membranes moist NECK: supple w/o LA CV: rrr. PULM: ctab, no inc wob ABD: soft, +bs EXT: no edema SKIN: no acute rash R wrist in brace.  Diabetic foot exam: Normal inspection No skin breakdown No calluses  Normal DP pulses Normal sensation to light touch and monofilament Nails normal

## 2014-10-02 NOTE — Patient Instructions (Signed)
I would get a flu shot each fall.   Recheck labs in about 3 months before a visit.  Change your goal sugar 100-130.  Stay in the wrist brace.  Add on lisinopril to protect your kidneys.  Take care.  Glad to see you.

## 2014-10-04 ENCOUNTER — Encounter: Payer: Self-pay | Admitting: Family Medicine

## 2014-10-04 NOTE — Assessment & Plan Note (Signed)
Continue as is, recheck tsh with next set of labs.

## 2014-10-04 NOTE — Assessment & Plan Note (Signed)
MALB up, A1c improved. D/w pt about ACE start for MALB.  Start ACE.  Has had low sugars, will change target sugar, 100-130 for insulin titration.  Avoid prolonged fasting.  D/w pt.  She agrees. Recheck in ~3 months.  >25 minutes spent in face to face time with patient, >50% spent in counselling or coordination of care.

## 2014-10-05 ENCOUNTER — Other Ambulatory Visit: Payer: Self-pay | Admitting: *Deleted

## 2014-10-05 MED ORDER — GLUCOSE BLOOD VI STRP: ORAL_STRIP | Status: DC

## 2014-10-05 MED ORDER — ONETOUCH ULTRA 2 W/DEVICE KIT: PACK | Status: DC

## 2014-10-05 MED ORDER — ONETOUCH ULTRASOFT LANCETS MISC: Status: DC

## 2014-10-05 NOTE — Telephone Encounter (Signed)
OneTouch Meter with Device Kit, test strips and lancets sent to [facility] in response to denial of [employer] Contour Next Blood Glucose Test Kit.

## 2014-10-05 NOTE — Telephone Encounter (Signed)
Patient doesn't care which.

## 2014-10-06 ENCOUNTER — Other Ambulatory Visit: Payer: BLUE CROSS/BLUE SHIELD

## 2014-10-06 ENCOUNTER — Telehealth: Payer: Self-pay

## 2014-10-06 NOTE — Telephone Encounter (Signed)
I agree. Thanks.

## 2014-10-06 NOTE — Telephone Encounter (Signed)
Pt left v/m; pts FBS running 172 -175 the past 3 mornings and at night BS 190's. Pt seen 10/02/14. Pt has been taking 10 U of insulin. Spoke with pt and reviewed instructions with pt from med list; if BS 100-130 no change; add 1 unit if AM BS > 130 and decrease 1 unit if < 100. Pt voiced understanding. FYI for Dr Damita Dunnings unless Dr Damita Dunnings has further instruction for pt.

## 2014-10-08 ENCOUNTER — Ambulatory Visit: Payer: BLUE CROSS/BLUE SHIELD | Admitting: Family Medicine

## 2014-10-13 ENCOUNTER — Other Ambulatory Visit: Payer: Self-pay | Admitting: *Deleted

## 2014-10-13 NOTE — Telephone Encounter (Signed)
Received faxed refill request from pharmacy. Called and verified with patient that she is taking 150 mcg at this time. Please confirm that this is the right dose that patient should be taking. See last lab report

## 2014-10-14 MED ORDER — LEVOTHYROXINE SODIUM 150 MCG PO TABS: 150.0000 ug | ORAL_TABLET | Freq: Every day | ORAL | Status: DC

## 2014-10-14 NOTE — Telephone Encounter (Signed)
If she has been on 135mcg, then continue and we'll recheck TSH as planned. Thanks.  Sent.

## 2014-10-21 ENCOUNTER — Other Ambulatory Visit: Payer: Self-pay | Admitting: Family Medicine

## 2014-10-21 NOTE — Telephone Encounter (Signed)
Refill request for Alprazolam. Last refilled on 10/02/14 for #60 with 0 refills. Ok to refill this medication?

## 2014-10-22 NOTE — Telephone Encounter (Signed)
Called patient to verify which mail order pharmacy. Patient wanted Rx sent in to [facility]. Rx faxed and patient notified.

## 2014-10-22 NOTE — Telephone Encounter (Signed)
This looks early for local refill.   She was going to be due for rx to be printed and sent to mail order, so she wouldn't run out.   That was to be done so she would have lag time accounted for with the mail/processing.   This was discussed at the last OV.  rx printed for mail order.  Please fax in and notify pt.   Thanks.

## 2014-10-29 ENCOUNTER — Encounter: Payer: Self-pay | Admitting: Neurology

## 2014-11-24 ENCOUNTER — Other Ambulatory Visit: Payer: Self-pay | Admitting: *Deleted

## 2014-11-24 NOTE — Telephone Encounter (Signed)
Patient left a voicemail requesting a 30 day refill on Lyrica. Patient stated that she no longer has her insurance and can not call mail order for a refill. Patient stated that she will have new insurance 12/08/14. Pharmacy [facility] [address]

## 2014-11-25 MED ORDER — PREGABALIN 75 MG PO CAPS: ORAL_CAPSULE | ORAL | Status: DC

## 2014-11-25 NOTE — Telephone Encounter (Signed)
Please call in.  Thanks.   

## 2014-11-25 NOTE — Telephone Encounter (Signed)
Rx called to pharmacy as instructed. Left message on voicemail for patient to call back.

## 2014-12-08 ENCOUNTER — Ambulatory Visit (INDEPENDENT_AMBULATORY_CARE_PROVIDER_SITE_OTHER): Payer: PPO | Admitting: Family Medicine

## 2014-12-08 ENCOUNTER — Encounter: Payer: Self-pay | Admitting: Family Medicine

## 2014-12-08 VITALS — BP 152/100 | HR 77 | Temp 98.4°F | Wt 147.0 lb

## 2014-12-08 DIAGNOSIS — R197 Diarrhea, unspecified: Secondary | ICD-10-CM | POA: Diagnosis not present

## 2014-12-08 MED ORDER — METFORMIN HCL 1000 MG PO TABS: 500.0000 mg | ORAL_TABLET | Freq: Two times a day (BID) | ORAL | Status: DC

## 2014-12-08 MED ORDER — OMEPRAZOLE 40 MG PO CPDR: 40.0000 mg | DELAYED_RELEASE_CAPSULE | Freq: Two times a day (BID) | ORAL | Status: DC

## 2014-12-08 MED ORDER — PREGABALIN 75 MG PO CAPS: ORAL_CAPSULE | ORAL | Status: DC

## 2014-12-08 MED ORDER — INSULIN GLARGINE 100 UNIT/ML SOLOSTAR PEN: 9.0000 [IU] | PEN_INJECTOR | Freq: Every day | SUBCUTANEOUS | Status: DC

## 2014-12-08 NOTE — Progress Notes (Signed)
Pre visit review using our clinic review tool, if applicable. No additional management support is needed unless otherwise documented below in the visit note.  She slipped in her yard on wet grass.  No LOC.  No other recent falls.  Went to PT prev for gait changes.  She is improved overall- she benefited from PT.   Her mother was recently in a scam and she has talked to the police.  She has changed her mother's bank account numbers and phone numbers.    Diarrhea.  Worse recently.  Happens often at baseline, but worse recently.  She has some rectal pain.  She has known hemorrhoids.  No blood in stool usually except for once recently and that was a small amount of bright red blood.  On metformin.  Sugar usually 120-150s.  No sick contacts, no travel, no trigger foods.  She has some ongoing abd discomfort for cramping.  Known HH.   Meds, vitals, and allergies reviewed.   ROS: See HPI.  Otherwise, noncontributory.  nad ncat except for bruise on R side of jaw noted.  Doesn't appear acute.   Mmm OP wnl Neck supple, no LA rrr ctab abd soft, not ttp, normal BS, no rebound Ext w/o edema.

## 2014-12-08 NOTE — Patient Instructions (Signed)
Cut the metformin in half for now.  Update me in about 1 week on the diarrhea and your BP. Take care.  Glad to see you.

## 2014-12-09 NOTE — Assessment & Plan Note (Signed)
Likely from metformin.  D/w pt and spouse.  Would cut dose in 1/2 and she'll monitor sugar/diarrhea.  Update me in about 1 week.  Okay for outpatient f/u.  Doesn't appear infectious.  Nontoxic.  >25 minutes spent in face to face time with patient, >50% spent in counselling or coordination of care.

## 2015-01-04 ENCOUNTER — Other Ambulatory Visit: Payer: Self-pay | Admitting: Family Medicine

## 2015-01-04 ENCOUNTER — Other Ambulatory Visit (INDEPENDENT_AMBULATORY_CARE_PROVIDER_SITE_OTHER): Payer: PPO

## 2015-01-04 DIAGNOSIS — Z119 Encounter for screening for infectious and parasitic diseases, unspecified: Secondary | ICD-10-CM

## 2015-01-04 DIAGNOSIS — E039 Hypothyroidism, unspecified: Secondary | ICD-10-CM | POA: Diagnosis not present

## 2015-01-04 DIAGNOSIS — E119 Type 2 diabetes mellitus without complications: Secondary | ICD-10-CM

## 2015-01-04 LAB — TSH: TSH: 0.03 u[IU]/mL — AB (ref 0.35–4.50)

## 2015-01-04 LAB — HEMOGLOBIN A1C: Hgb A1c MFr Bld: 7.6 % — ABNORMAL HIGH (ref 4.6–6.5)

## 2015-01-05 LAB — HIV ANTIBODY (ROUTINE TESTING W REFLEX): HIV 1&2 Ab, 4th Generation: NONREACTIVE

## 2015-01-05 LAB — HEPATITIS C ANTIBODY: HCV Ab: NEGATIVE

## 2015-01-08 ENCOUNTER — Encounter: Payer: Self-pay | Admitting: Family Medicine

## 2015-01-08 ENCOUNTER — Ambulatory Visit (INDEPENDENT_AMBULATORY_CARE_PROVIDER_SITE_OTHER): Payer: PPO | Admitting: Family Medicine

## 2015-01-08 VITALS — BP 150/80 | HR 79 | Temp 98.0°F | Wt 146.5 lb

## 2015-01-08 DIAGNOSIS — E119 Type 2 diabetes mellitus without complications: Secondary | ICD-10-CM | POA: Diagnosis not present

## 2015-01-08 DIAGNOSIS — B80 Enterobiasis: Secondary | ICD-10-CM | POA: Diagnosis not present

## 2015-01-08 DIAGNOSIS — E1149 Type 2 diabetes mellitus with other diabetic neurological complication: Secondary | ICD-10-CM

## 2015-01-08 DIAGNOSIS — Z23 Encounter for immunization: Secondary | ICD-10-CM | POA: Diagnosis not present

## 2015-01-08 DIAGNOSIS — E039 Hypothyroidism, unspecified: Secondary | ICD-10-CM | POA: Diagnosis not present

## 2015-01-08 MED ORDER — VENLAFAXINE HCL ER 150 MG PO CP24: 150.0000 mg | ORAL_CAPSULE | Freq: Two times a day (BID) | ORAL | Status: AC

## 2015-01-08 MED ORDER — RANITIDINE HCL 300 MG PO TABS: 300.0000 mg | ORAL_TABLET | Freq: Every day | ORAL | Status: DC

## 2015-01-08 MED ORDER — ALBENDAZOLE 200 MG PO TABS: 400.0000 mg | ORAL_TABLET | Freq: Once | ORAL | Status: DC

## 2015-01-08 MED ORDER — LISINOPRIL 2.5 MG PO TABS: 2.5000 mg | ORAL_TABLET | Freq: Every day | ORAL | Status: DC

## 2015-01-08 MED ORDER — LEVOTHYROXINE SODIUM 137 MCG PO TABS: 137.0000 ug | ORAL_TABLET | Freq: Every day | ORAL | Status: DC

## 2015-01-08 NOTE — Progress Notes (Signed)
Pre visit review using our clinic review tool, if applicable. No additional management support is needed unless otherwise documented below in the visit note.  She has recently seen pinworms in her stools. D/w pt.  No one else sick at home.  No sick contacts known.    She had cut her metformin back due to diarrhea.  Sugar has been ~100-130 recently. No lows, none <75.  Still with foot pain, lyrica helps some.    Hypothyroid.  TSH low, ie overreplaced now.  D/w pt.  Compliant with med. Her tremor came back recently.    Meds, vitals, and allergies reviewed.   ROS: See HPI.  Otherwise, noncontributory.  GEN: nad, alert and oriented, not acutely ill appearing  HEENT: mucous membranes moist NECK: supple w/o LA CV: rrr. PULM: ctab, no inc wob ABD: soft, +bs EXT: no edema SKIN: no acute rash Faint B hand tremor noted.

## 2015-01-08 NOTE — Patient Instructions (Signed)
Change levothyroxine to 138mcg a day when it comes in from mail order.  In the meantime, alternate taking 75 and 162mcg a day.  Stop that when the new dose comes in.  Take 2 albendazole tabs and then repeat in 2 weeks.  Recheck labs in about 3 months before a visit.  Take care.  Glad to see you.

## 2015-01-10 DIAGNOSIS — B80 Enterobiasis: Secondary | ICD-10-CM | POA: Insufficient documentation

## 2015-01-10 NOTE — Assessment & Plan Note (Signed)
We didn't check her stool sample, based on her description of the sx.   Take 2 albendazole tabs and then repeat in 2 weeks.  She agrees.

## 2015-01-10 NOTE — Assessment & Plan Note (Signed)
Over replaced. Change levothyroxine to 110mcg a day when it comes in from mail order.  In the meantime, alternate taking 75 and 148mcg a day. Stop that when the new dose comes in.  D/w pt and husband. All in agreement.   That should help the tremor.   Recheck in about 2-3 months.

## 2015-01-10 NOTE — Assessment & Plan Note (Signed)
Reasonable control for now, no lows.  We need to address thyroid replacement more than DM2.  D/w pt.  She agrees.  No change in meds. >25 minutes spent in face to face time with patient, >50% spent in counselling or coordination of care.

## 2015-01-12 ENCOUNTER — Telehealth: Payer: Self-pay | Admitting: Family Medicine

## 2015-01-12 NOTE — Telephone Encounter (Signed)
Patient advised.

## 2015-01-12 NOTE — Telephone Encounter (Signed)
Continue with stool softeners and/or miralax prn. 1st dose of med may kill the worms but she'll still have to pass them.  It not any better in a few days, then update me.  Will still need second dose of med as scheduled for worms even if the sx resolve in the meantime. Thanks.

## 2015-01-12 NOTE — Telephone Encounter (Signed)
Pt took her 2 pills on Saturday.  She still cannot have a bowel movement.  She said if she does go it is very small and still full of pin worms.  Please call pt.

## 2015-01-19 ENCOUNTER — Telehealth: Payer: Self-pay | Admitting: Family Medicine

## 2015-01-19 MED ORDER — MEBENDAZOLE 100 MG PO CHEW: 100.0000 mg | CHEWABLE_TABLET | Freq: Once | ORAL | Status: DC

## 2015-01-19 NOTE — Telephone Encounter (Signed)
Change to mebendazole, take one dose.  That should take care of it.  rx sent.  Thanks.

## 2015-01-19 NOTE — Telephone Encounter (Signed)
Pt called to let Lugene know that the RX she was prescribed last week, Albenza, was discontinued and the pharmacy did offer her another option but it was $800.00 and out of her price range.   She would like to know if there was anything else Dr. Damita Dunnings could prescribe for her?  Thank you  CB# [phone]

## 2015-01-19 NOTE — Telephone Encounter (Signed)
Patient advised.

## 2015-01-21 ENCOUNTER — Telehealth: Payer: Self-pay

## 2015-01-21 MED ORDER — FLUCONAZOLE 150 MG PO TABS: 150.0000 mg | ORAL_TABLET | Freq: Once | ORAL | Status: DC

## 2015-01-21 NOTE — Telephone Encounter (Signed)
Sent diflucan.  Thanks.

## 2015-01-21 NOTE — Addendum Note (Signed)
Addended by: Tonia Ghent on: 01/21/2015 03:45 PM   Modules accepted: Orders

## 2015-01-21 NOTE — Telephone Encounter (Signed)
Patient left a voicemail stating that she believes that medication she was just prescribed, mebendazole, has caused her to have a yeast infection. Patient was wondering if there was anything that could be given to treat that, or if she needs to come in for an appt?

## 2015-01-21 NOTE — Telephone Encounter (Signed)
Husband advised. 

## 2015-01-25 ENCOUNTER — Telehealth: Payer: Self-pay | Admitting: Family Medicine

## 2015-01-25 ENCOUNTER — Encounter: Payer: Self-pay | Admitting: Emergency Medicine

## 2015-01-25 ENCOUNTER — Emergency Department
Admission: EM | Admit: 2015-01-25 | Discharge: 2015-01-25 | Discharge status: Against Medical Advice | Payer: PPO | Attending: Emergency Medicine | Admitting: Emergency Medicine

## 2015-01-25 DIAGNOSIS — Z794 Long term (current) use of insulin: Secondary | ICD-10-CM | POA: Diagnosis not present

## 2015-01-25 DIAGNOSIS — E1149 Type 2 diabetes mellitus with other diabetic neurological complication: Secondary | ICD-10-CM | POA: Insufficient documentation

## 2015-01-25 DIAGNOSIS — B80 Enterobiasis: Secondary | ICD-10-CM | POA: Insufficient documentation

## 2015-01-25 DIAGNOSIS — Z7982 Long term (current) use of aspirin: Secondary | ICD-10-CM | POA: Diagnosis not present

## 2015-01-25 DIAGNOSIS — Z7984 Long term (current) use of oral hypoglycemic drugs: Secondary | ICD-10-CM | POA: Insufficient documentation

## 2015-01-25 DIAGNOSIS — I10 Essential (primary) hypertension: Secondary | ICD-10-CM | POA: Diagnosis not present

## 2015-01-25 DIAGNOSIS — Z79899 Other long term (current) drug therapy: Secondary | ICD-10-CM | POA: Diagnosis not present

## 2015-01-25 MED ORDER — MEBENDAZOLE 100 MG PO CHEW: 100.0000 mg | CHEWABLE_TABLET | Freq: Once | ORAL | Status: DC

## 2015-01-25 NOTE — ED Provider Notes (Signed)
[facility] Emergency Department Provider Note  ____________________________________________  Time seen: Approximately 4:38 PM  I have reviewed the triage vital signs and the nursing notes.   HISTORY  Chief Complaint pinworms     HPI Elizabeth Rivera is a 64 y.o. female who presents emergency department for complaint of pinworms. Per the patient she has been diagnosed with same by her primary care provider and given "medication for same." She states that she was making "progress" she feels like she has not fully resolved this issue. Patient is requesting further medication for same.Patient denies any abdominal pain, nausea vomiting, diarrhea, bloody stools.   Past Medical History  Diagnosis Date  . Thyroid disease     hypothyroid  . Anxiety   . Hyperlipidemia   . Hypertension   . Other chest pain     chest discomfort  . Obesity   . Neuropathy ([place])   . Other esophagitis     erosive esophagitis  . Other specified gastritis without mention of hemorrhage     erosvie gastritis  . Pain in limb     Right leg pain  . Other specified disorders of adrenal glands 07/2007    adrenal mass via of CT 1.4cm left Adrenal no change(Dr.Cope)   . Other abnormal blood chemistry   . Diverticulosis of colon (without mention of hemorrhage) 05/20/2009    Internal hemms (Dr. Vira Agar)  . Irritable bowel syndrome     history of  . Insomnia, unspecified   . Nervous breakdown 08/2002    [facility]  . GERD (gastroesophageal reflux disease)     reflux HH 09/01/2002//egd reflux Esoph. HH Gastritis nonbleeding erosive gastropathy Duodentitis 08/15/2006  . Hyperglycemia   . Candidal vaginitis   . Forearm fracture     2013, left  . Anemia   . Anemia of other chronic disease 09/01/2013    Patient Active Problem List   Diagnosis Date Noted  . Pinworm infection 01/10/2015  . Hand pain 09/09/2014  . Psychomotor retardation 05/25/2014  . Syncope, cardiogenic 05/05/2014   . Tremor 05/05/2014  . Multiple falls 11/28/2013  . Elevated immunoglobulin A 09/02/2013  . Anemia of other chronic disease 09/01/2013  . Barrett's esophagus 08/18/2013  . Other malaise and fatigue 07/29/2013  . Diarrhea 03/18/2013  . UTI (urinary tract infection) 12/22/2012  . DM (diabetes mellitus), type 2 with neurological complications ([place]) 27/08/8673  . Bilateral leg pain 07/02/2012  . Urinary incontinence 09/25/2011  . Diaphoresis 12/21/2010  . Leg edema 05/11/2010  . B12 deficiency 05/11/2010  . BENIGN POSITIONAL VERTIGO 01/06/2009  . ABDOMINAL PAIN, LEFT UPPER QUADRANT 12/10/2008  . UNSPECIFIED VITAMIN D DEFICIENCY 09/07/2008  . ROSACEA 09/07/2008  . NEUROPATHY 08/24/2008  . Other chest pain 05/19/2008  . EROSIVE ESOPHAGITIS 02/03/2008  . EROSIVE GASTRITIS 02/03/2008  . Anxiety state 02/14/2007  . OBESITY 11/15/2006  . Extremity pain 09/28/2006  . ADRENAL MASS VIA CT 07/07/2006  . Hypothyroidism 07/05/2006  . HYPERLIPIDEMIA, MIXED 07/05/2006  . HYPERTENSION 07/05/2006  . GERD 07/05/2006  . DIVERTICULOSIS, COLON 07/05/2006  . IBS 07/05/2006  . INSOMNIA 07/05/2006    Past Surgical History  Procedure Laterality Date  . Cholecystectomy  03/2004  . Abdominal hysterectomy  1995    Part Hyst BSO DUB ovarian cyst B9  . Vaginal delivery  1977  . Breast reduction surgery  1984  . Cystoscopy  01/08/2008    Former site of inflammation healed (Dr. Jacqlyn Larsen)  . Nsvd x1  1977  . Esophagogastroduodenoscopy  1. 09/01/02  2. 08/15/06    1. Reflux, HH  2. Reflux esoph HH gaastritis nonbleeding erosive gastropathy duodenitis  . Colonoscopy  1. 08/15/06  2. 05/20/09    Int Hemms  2. Divertics Int Hemms (Dr. Vira Agar)  . Ct abd w & pelvis wo cm  6/09    CT Scan abd stable adrenal adenoma 1.4cm left adrenal no change (Dr. Jacqlyn Larsen)    Current Outpatient Rx  Name  Route  Sig  Dispense  Refill  . ALPRAZolam (XANAX) 0.25 MG tablet      TAKE 1 TABLET BY MOUTH TWICE DAILY AS NEEDED   180  tablet   1   . aspirin 81 MG tablet   Oral   Take 81 mg by mouth daily.         . Blood Glucose Monitoring Suppl (ONE TOUCH ULTRA 2) W/DEVICE KIT      Use to test blood sugar twice daily and as directed.  Diagnosis:  E11.40  Insulin-dependent.   1 each   0   . Cholecalciferol (VITAMIN D-1000 MAX ST) 1000 UNITS tablet   Oral   Take by mouth.         . ferrous sulfate 325 (65 FE) MG tablet   Oral   Take 1 tablet (325 mg total) by mouth daily with breakfast.         . fluconazole (DIFLUCAN) 150 MG tablet   Oral   Take 1 tablet (150 mg total) by mouth once.   2 tablet   0     Repeat in 1 week if needed.   Marland Kitchen glucose blood (ONE TOUCH ULTRA TEST) test strip      Use as instructed to test blood sugar twice daily and as directed.  Diagnosis:  E11.40  Insulin dependent.   200 each   3   . Insulin Glargine (LANTUS SOLOSTAR) 100 UNIT/ML Solostar Pen   Subcutaneous   Inject 9 Units into the skin daily at 10 pm. If sugar 100-130, no change.  Add 1 unit if AM sugar >130. Dec 1 unit if <100.   15 mL      . Lancets (ONETOUCH ULTRASOFT) lancets      Use as instructed to test blood sugar twice daily and as directed.  Diagnosis:  E11.40   Insulin-dependent.   200 each   3   . levothyroxine (SYNTHROID, LEVOTHROID) 137 MCG tablet   Oral   Take 1 tablet (137 mcg total) by mouth daily before breakfast.   90 tablet   3   . lisinopril (ZESTRIL) 2.5 MG tablet   Oral   Take 1 tablet (2.5 mg total) by mouth daily.   90 tablet   3   . mebendazole (VERMOX) 100 MG chewable tablet   Oral   Chew 1 tablet (100 mg total) by mouth once.   2 tablet   0     Take 1 tablet now. Take 1 tablet in 2 weeks.   . metFORMIN (GLUCOPHAGE) 1000 MG tablet   Oral   Take 0.5 tablets (500 mg total) by mouth 2 (two) times daily with a meal.   60 tablet   0   . metoprolol succinate (TOPROL XL) 100 MG 24 hr tablet   Oral   Take 1 tablet (100 mg total) by mouth daily.   30 tablet   3   .  omeprazole (PRILOSEC) 40 MG capsule   Oral   Take 1 capsule (40 mg total) by  mouth 2 (two) times daily.   180 capsule   1   . pregabalin (LYRICA) 75 MG capsule      2 tabs in AM and 1 in PM.   90 capsule   5   . ranitidine (ZANTAC) 300 MG tablet   Oral   Take 1 tablet (300 mg total) by mouth daily.   90 tablet   3   . venlafaxine XR (EFFEXOR-XR) 150 MG 24 hr capsule   Oral   Take 1 capsule (150 mg total) by mouth 2 (two) times daily.   180 capsule   3   . vitamin B-12 (CYANOCOBALAMIN) 1000 MCG tablet   Oral   Take 1,000 mcg by mouth 2 (two) times daily.             Allergies Sulfonamide derivatives and Sulfa antibiotics  Family History  Problem Relation Age of Onset  . Hypertension Mother   . Hyperlipidemia Mother   . Stroke Mother     63  . Vision loss Father     vision problems  . Cancer Father     Colon   . Hyperlipidemia Sister   . Hypertension Sister   . Vision loss Sister     Social History Social History  Substance Use Topics  . Smoking status: Never Smoker   . Smokeless tobacco: Never Used  . Alcohol Use: No    Review of Systems Constitutional: No fever/chills Eyes: No visual changes. ENT: No sore throat. Cardiovascular: Denies chest pain. Respiratory: Denies shortness of breath. Gastrointestinal: No abdominal pain.  No nausea, no vomiting.  No diarrhea.  No constipation. Endorses pinworm infection. Genitourinary: Negative for dysuria. Musculoskeletal: Negative for back pain. Skin: Negative for rash. Neurological: Negative for headaches, focal weakness or numbness.  10-point ROS otherwise negative.  ____________________________________________   PHYSICAL EXAM:  VITAL SIGNS: ED Triage Vitals  Enc Vitals Group     BP 01/25/15 1612 195/96 mmHg     Pulse Rate 01/25/15 1612 76     Resp 01/25/15 1612 20     Temp 01/25/15 1612 97.5 F (36.4 C)     Temp Source 01/25/15 1612 Oral     SpO2 01/25/15 1612 98 %     Weight 01/25/15  1612 140 lb (63.504 kg)     Height 01/25/15 1612 5' 2"  (1.575 m)     Head Cir --      Peak Flow --      Pain Score --      Pain Loc --      Pain Edu? --      Excl. in [place]? --     Constitutional: Alert and oriented. Well appearing and in no acute distress. Eyes: Conjunctivae are normal. PERRL. EOMI. Head: Atraumatic. Nose: No congestion/rhinnorhea. Mouth/Throat: Mucous membranes are moist.  Oropharynx non-erythematous. Neck: No stridor.   Cardiovascular: Normal rate, regular rhythm. Grossly normal heart sounds.  Good peripheral circulation. Respiratory: Normal respiratory effort.  No retractions. Lungs CTAB. Gastrointestinal: Soft and nontender. No distention. No abdominal bruits. No CVA tenderness. Musculoskeletal: No lower extremity tenderness nor edema.  No joint effusions. Neurologic:  Normal speech and language. No gross focal neurologic deficits are appreciated. No gait instability. Skin:  Skin is warm, dry and intact. No rash noted. Psychiatric: Mood and affect are normal. Speech and behavior are normal.  ____________________________________________   LABS (all labs ordered are listed, but only abnormal results are displayed)  Labs Reviewed - No data to display ____________________________________________  EKG  ____________________________________________  RADIOLOGY   ____________________________________________   PROCEDURES  Procedure(s) performed: None  Critical Care performed: No  ____________________________________________   INITIAL IMPRESSION / ASSESSMENT AND PLAN / ED COURSE  Pertinent labs & imaging results that were available during my care of the patient were reviewed by me and considered in my medical decision making (see chart for details).  Patient presents emergency Department with a known diagnosis of pinworm infection. Patient is requesting further medication for treatment. She states that she was placed on an unknown medication by her  primary care that she took once but states that she hasn't fully resolved complaint. She is requesting further medication at this time. Patient was placed on mebendazole 100 mg once this week and 1 tablet in 2 weeks. She will given a follow-up with gastroenterology. Patient verbalizes understanding of diagnosis and treatment plan and verbalizes that she will follow-up with GI after second dosing of medication.    New Prescriptions   MEBENDAZOLE (VERMOX) 100 MG CHEWABLE TABLET    Chew 1 tablet (100 mg total) by mouth once.    ____________________________________________   FINAL CLINICAL IMPRESSION(S) / ED DIAGNOSES  Final diagnoses:  Pinworm infection      Darletta Moll, PA-C 01/25/15 [place], MD 01/26/15 2300

## 2015-01-25 NOTE — Discharge Instructions (Signed)
Nonpathogenic Intestinal Amebae Infection  Nonpathogenic intestinal amebae are single-celled organisms that can live in your intestine without causing illness, even if you have a weak body defense system (immune system).  These amebae make up the Entamoeba species. These organisms are also called protozoa.  WHAT ARE SOME COMMON NONPATHOGENIC AMEBAE?  Species of nonpathogenic amebae include:  Entamoeba coli.  Entamoeba hartmanni.  Entamoeba polecki.  Entamoeba dispar. WHERE DO THESE PARASITES LIVE IN THE BODY?  All amebae of this type live inside your large intestine (colon). Your colon is at the end of your digestive system. That is where water is absorbed and stool is formed. Nonpathogenic amebae can live in your colon for years.  HOW DOES INFECTION WITH NONPATHOGENIC INTESTINAL AMEBAE HAPPEN?  These amebae are shed in an infected person's stool. If amebae get into the soil or water, they can contaminate food grown in the soil. This often happens in areas where human waste is used as a [employer] manager. Then the organisms can get into your digestive system when you eat food or drink water contaminated with amebae.  People who are shedding amebae in their stool can also pass the amebae to others if they do not wash their hands after going to the bathroom and then handle food eaten by others.  DO I NEED TO SEEK CARE IF I THINK I MIGHT BE INFECTED WITH ONE OR MORE OF THESE PARASITES?  Nonpathogenic intestinal amebae do not cause any signs or symptoms. If you have symptoms, it means something else is causing your illness.  Contact your health care provider if you are not feeling well or have symptoms of another intestinal infection. These symptoms can include:  Diarrhea.  Cramps or belly pain.  Nausea or vomiting.  Blood in your stool.  Fever. HOW IS INFECTION DIAGNOSED?  Your health care provider can diagnose this type of infection by examining a stool sample under a microscope.  IS ANY TREATMENT  REQUIRED IF I DO HAVE THIS TYPE OF INFECTION?  Nonpathogenic intestinal amebae infections do not need to be treated.  HOW LONG CAN THIS TYPE OF INFECTION LAST?  Nonpathogenic amebae infections can last for weeks to years.  This information is not intended to replace advice given to you by your health care provider. Make sure you discuss any questions you have with your health care provider.  Document Released: 12/22/2003 Document Revised: 02/13/2014 Document Reviewed: 05/09/2013  Elsevier Interactive Patient Education [employer].

## 2015-01-25 NOTE — ED Notes (Signed)
Pt states she has had pinworms for several weeks and has been treated w/ four different medications; pt reports that she could see progress w/ the last medication but feels that it "did not kill them all."

## 2015-01-25 NOTE — Telephone Encounter (Signed)
Patient Name: Elizabeth Rivera  DOB: 05-Feb-1951    Initial Comment [name] states she has had pinworms for over 9 wks. Has been taking medication, nothing working.   Nurse Assessment  Nurse: Leilani Merl, RN, Heather Date/Time (Eastern Time): 01/25/2015 1:27:18 PM  Confirm and document reason for call. If symptomatic, describe symptoms. ---[name] states she has had pinworms for over 9 wks. Has been taking medication, nothing working. She has taken 5 different pills she has taken in the last 9 weeks but she had black stool again yesterday, she has had the black stool for a while.  Has the patient traveled out of the country within the last 30 days? ---Not Applicable  Does the patient have any new or worsening symptoms? ---Yes  Will a triage be completed? ---Yes  Related visit to physician within the last 2 weeks? ---No  Does the PT have any chronic conditions? (i.e. diabetes, asthma, etc.) ---Yes  List chronic conditions. ---see MR  Is this a behavioral health or substance abuse call? ---No     Guidelines    Guideline Title Affirmed Question Affirmed Notes  Rectal Symptoms [1] Home treatment for > 3 days for rectal itching AND [2] not improved   Rectal Bleeding Tarry or jet black-colored stool (not dark green)    Final Disposition User   Go to ED Now [place], RN, [facility] - ED   Disagree/Comply: Comply    Disagree/Comply: Comply

## 2015-01-25 NOTE — ED Notes (Signed)
Pt currently being treated for pinworms, has had for three weeks now.

## 2015-01-26 NOTE — Telephone Encounter (Signed)
Spoke with husband who says he does not think patient's insurance requires a referral and they will be seeing the MD that she is established with.

## 2015-01-26 NOTE — Telephone Encounter (Signed)
I saw the ER note.  They mentioned GI follow up/referral but I don't see it in the EMR.  If she hasn't been given a GI referral and needs one, then let me know and I'll order.  Thanks.

## 2015-01-27 ENCOUNTER — Telehealth: Payer: Self-pay | Admitting: Family Medicine

## 2015-01-27 DIAGNOSIS — B80 Enterobiasis: Secondary | ICD-10-CM

## 2015-01-27 NOTE — Telephone Encounter (Signed)
Pt called - pt needs a referral for GI doctor - Dr Vira Agar in [place] at [facility]. The Ed doc wants her to be seen soon in a couple of weeks    cb number is [phone]

## 2015-01-28 NOTE — Telephone Encounter (Signed)
I put in the referral.  Thanks.  

## 2015-02-09 ENCOUNTER — Encounter: Payer: Self-pay | Admitting: Family Medicine

## 2015-02-09 ENCOUNTER — Ambulatory Visit (INDEPENDENT_AMBULATORY_CARE_PROVIDER_SITE_OTHER): Payer: PPO | Admitting: Family Medicine

## 2015-02-09 VITALS — BP 150/90 | HR 72 | Temp 97.8°F | Wt 142.0 lb

## 2015-02-09 DIAGNOSIS — B80 Enterobiasis: Secondary | ICD-10-CM

## 2015-02-09 DIAGNOSIS — I10 Essential (primary) hypertension: Secondary | ICD-10-CM

## 2015-02-09 MED ORDER — PREGABALIN 75 MG PO CAPS: ORAL_CAPSULE | ORAL | Status: DC

## 2015-02-09 MED ORDER — ALPRAZOLAM 0.25 MG PO TABS: 0.2500 mg | ORAL_TABLET | Freq: Two times a day (BID) | ORAL | Status: DC | PRN

## 2015-02-09 MED ORDER — METFORMIN HCL 1000 MG PO TABS: 500.0000 mg | ORAL_TABLET | Freq: Two times a day (BID) | ORAL | Status: DC

## 2015-02-09 MED ORDER — LISINOPRIL 5 MG PO TABS: 5.0000 mg | ORAL_TABLET | Freq: Every day | ORAL | Status: DC

## 2015-02-09 NOTE — Patient Instructions (Signed)
Increase the lisinopril to 5mg  a day.  If your BP stays up after about 10 days (>140/>90), then let me know.  I'll await the final GI testing in the meantime.  Continue miralax daily for now for constipation.   Take care.  Glad to see you.

## 2015-02-09 NOTE — Progress Notes (Signed)
Pre visit review using our clinic review tool, if applicable. No additional management support is needed unless otherwise documented below in the visit note.  She was seen at GI clinic for constipation, treated with miralax and OTC meds.  Noted stool burden on xray at GI clinic.  Has had a big BM in the meantime, last BM was 3 days ago.  She still feels bloated.  Has tolerated miralax.  Wasn't able to afford other meds for constipation.    She still claims to see worms in her stools.  She has labs pending for parasites at GI clinic.  She is still worried about the dx, stool contents, etc.  Husband has noted wife's stool abnormalities but he has no sx.  He has f/u with his MD pending for later this week.    She needed refills on meds, see below.    BP elevation noted at home, at GI clinic.  No CP, not SOB.  Compliant with meds.  No ADE on med.   Meds, vitals, and allergies reviewed.   ROS: See HPI.  Otherwise, noncontributory.  GEN: nad, alert and oriented HEENT: mucous membranes moist NECK: supple w/o LA CV: rrr.  PULM: ctab, no inc wob ABD: soft, +bs, not ttp EXT: no edema

## 2015-02-10 NOTE — Assessment & Plan Note (Signed)
Still presumed, s/p tx, I'll defer to GI and will await results.  She agrees.  Okay for outpatient f/u. >25 minutes spent in face to face time with patient, >50% spent in counselling or coordination of care.

## 2015-02-10 NOTE — Assessment & Plan Note (Signed)
Inc lisinopril to 5mg  a day and update me re: BP if still elevated.  She agrees.  rx sent.  Okay for outpatient f/u.

## 2015-02-12 ENCOUNTER — Telehealth: Payer: Self-pay

## 2015-02-12 NOTE — Telephone Encounter (Signed)
Pt left v/m and request cb about pin worms.

## 2015-02-12 NOTE — Telephone Encounter (Signed)
I wouldn't do anything else yet until she hears from GI about the parasite tests.  Has she heard anything from that?

## 2015-02-12 NOTE — Telephone Encounter (Signed)
Pt is aware as instructed 

## 2015-02-12 NOTE — Telephone Encounter (Signed)
Pt left /vm; pt has had pin worms for one month; pts husbands doctor gave him enberm 100 mg chewable tabs # 6 with instructions chew one tab by mouth bid. Pt request this med to get rid of pin worms. Pt last seen 02/09/15. Pt request cb.[facility] [address]

## 2015-03-01 ENCOUNTER — Telehealth: Payer: Self-pay | Admitting: Family Medicine

## 2015-03-01 NOTE — Telephone Encounter (Signed)
Pt last saw red bugs with antler like heads in BM; last time saw red bugs was 02/28/15. In 12 weeks pt has seen approx 1000 red bugs. Pt had BM on 03/01/15 and saw white bug that look like red bugs except the color was white. Pt said BM has been black for 2-3 months; pt has been taking iron for one month. Pt said that she has looked up blood worms and pt thinks that is why her stool is black due to blood worms eating her intestines. Pt has not seen blood in BM. Pt does not want to see GI doctor anymore because parasite test was negative and pt was not happy with GI doctor. 1st appt convenient for pt to see Dr Darden Palmer is only provider pt wants to see) was 03/04/15 at 2 PM. FYI to Dr Damita Dunnings. Advised pt if does see blood in BM to call [facility] or go to ED for eval. Pt voiced understanding.

## 2015-03-01 NOTE — Telephone Encounter (Signed)
[facility]  Patient Name: Elizabeth Rivera  DOB: [birth year]    Initial Comment [name] States: I was dx with pin worms, and now I am having bugs in my poop, they are red    Nurse Assessment      Guidelines    Guideline Title Affirmed Question Affirmed Notes       Final Disposition User   FINAL ATTEMPT MADE - no message left Harlow Mares, Therapist, sports, Suanne Marker

## 2015-03-01 NOTE — Telephone Encounter (Signed)
Noted. Thanks.

## 2015-03-04 ENCOUNTER — Other Ambulatory Visit: Payer: Self-pay | Admitting: *Deleted

## 2015-03-04 ENCOUNTER — Encounter: Payer: Self-pay | Admitting: Family Medicine

## 2015-03-04 ENCOUNTER — Ambulatory Visit (INDEPENDENT_AMBULATORY_CARE_PROVIDER_SITE_OTHER): Payer: PPO | Admitting: Family Medicine

## 2015-03-04 VITALS — BP 150/88 | HR 74 | Temp 97.4°F | Wt 141.0 lb

## 2015-03-04 DIAGNOSIS — B80 Enterobiasis: Secondary | ICD-10-CM

## 2015-03-04 DIAGNOSIS — E039 Hypothyroidism, unspecified: Secondary | ICD-10-CM

## 2015-03-04 DIAGNOSIS — R195 Other fecal abnormalities: Secondary | ICD-10-CM | POA: Diagnosis not present

## 2015-03-04 DIAGNOSIS — E119 Type 2 diabetes mellitus without complications: Secondary | ICD-10-CM

## 2015-03-04 LAB — TSH: TSH: 0.04 u[IU]/mL — AB (ref 0.35–4.50)

## 2015-03-04 MED ORDER — OMEPRAZOLE 40 MG PO CPDR: 40.0000 mg | DELAYED_RELEASE_CAPSULE | Freq: Two times a day (BID) | ORAL | Status: DC

## 2015-03-04 NOTE — Patient Instructions (Addendum)
Go to the lab on the way out.  We'll contact you with your lab report. Pick up the stool kit and get your TSH drawn.  Keep using the miralax and dulcolax for now.  Take care. Glad to see you.

## 2015-03-04 NOTE — Progress Notes (Signed)
Pre visit review using our clinic review tool, if applicable. No additional management support is needed unless otherwise documented below in the visit note.  Has seen GI re: possible stool changes, alleged worms.  "I think they're all dead now."   Still has seen "red bugs with black dots" in her stool.  Family asked if she was hallucinating, which upset the patient.  She clearly thinks she has seen these worms. Her stool studies are neg so are and she has been treated.  No one else is stick.  She is worried about the whole affair.   Hypothyroid.  Due for f/u testing.  Compliant with 15mcg a day.  Some numbness in the feet and hands, better with lyrica.    Meds, vitals, and allergies reviewed.   ROS: See HPI.  Otherwise, noncontributory.  nad Worried appearing but speech wnl Neck supple, no LA rrr ctab abd soft, not ttp Ext w/o edema

## 2015-03-05 ENCOUNTER — Other Ambulatory Visit: Payer: Self-pay | Admitting: Family Medicine

## 2015-03-05 DIAGNOSIS — E039 Hypothyroidism, unspecified: Secondary | ICD-10-CM

## 2015-03-05 MED ORDER — LEVOTHYROXINE SODIUM 125 MCG PO TABS: 125.0000 ug | ORAL_TABLET | Freq: Every day | ORAL | Status: DC

## 2015-03-05 NOTE — Assessment & Plan Note (Signed)
See notes on labs. 

## 2015-03-05 NOTE — Assessment & Plan Note (Signed)
Presumed, recheck stool studies.  I don't know if patient is mistaking the contents of her stools at this point.  Still okay for outpatient f/u.  We'll see what the next round of stools tests show. >25 minutes spent in face to face time with patient, >50% spent in counselling or coordination of care.

## 2015-03-08 NOTE — Addendum Note (Signed)
Addended by: Royann Shivers A on: 03/08/2015 02:35 PM   Modules accepted: Orders

## 2015-03-09 LAB — OVA AND PARASITE EXAMINATION: OP: NONE SEEN

## 2015-03-11 ENCOUNTER — Emergency Department: Payer: PPO

## 2015-03-11 ENCOUNTER — Encounter: Payer: Self-pay | Admitting: Emergency Medicine

## 2015-03-11 ENCOUNTER — Inpatient Hospital Stay: Payer: PPO

## 2015-03-11 ENCOUNTER — Inpatient Hospital Stay
Admission: EM | Admit: 2015-03-11 | Discharge: 2015-03-16 | DRG: 065 | Disposition: A | Discharge status: Skilled Nursing Facility | Payer: PPO | Attending: Internal Medicine | Admitting: Internal Medicine

## 2015-03-11 DIAGNOSIS — R112 Nausea with vomiting, unspecified: Secondary | ICD-10-CM | POA: Diagnosis present

## 2015-03-11 DIAGNOSIS — Z6826 Body mass index (BMI) 26.0-26.9, adult: Secondary | ICD-10-CM | POA: Diagnosis not present

## 2015-03-11 DIAGNOSIS — I639 Cerebral infarction, unspecified: Principal | ICD-10-CM | POA: Diagnosis present

## 2015-03-11 DIAGNOSIS — Z882 Allergy status to sulfonamides status: Secondary | ICD-10-CM | POA: Diagnosis not present

## 2015-03-11 DIAGNOSIS — Z7982 Long term (current) use of aspirin: Secondary | ICD-10-CM

## 2015-03-11 DIAGNOSIS — I669 Occlusion and stenosis of unspecified cerebral artery: Secondary | ICD-10-CM | POA: Diagnosis present

## 2015-03-11 DIAGNOSIS — Z9889 Other specified postprocedural states: Secondary | ICD-10-CM

## 2015-03-11 DIAGNOSIS — E782 Mixed hyperlipidemia: Secondary | ICD-10-CM | POA: Diagnosis present

## 2015-03-11 DIAGNOSIS — F419 Anxiety disorder, unspecified: Secondary | ICD-10-CM | POA: Diagnosis present

## 2015-03-11 DIAGNOSIS — R58 Hemorrhage, not elsewhere classified: Secondary | ICD-10-CM | POA: Diagnosis not present

## 2015-03-11 DIAGNOSIS — Z8249 Family history of ischemic heart disease and other diseases of the circulatory system: Secondary | ICD-10-CM

## 2015-03-11 DIAGNOSIS — E039 Hypothyroidism, unspecified: Secondary | ICD-10-CM | POA: Diagnosis present

## 2015-03-11 DIAGNOSIS — Z821 Family history of blindness and visual loss: Secondary | ICD-10-CM | POA: Diagnosis not present

## 2015-03-11 DIAGNOSIS — F329 Major depressive disorder, single episode, unspecified: Secondary | ICD-10-CM | POA: Diagnosis present

## 2015-03-11 DIAGNOSIS — K219 Gastro-esophageal reflux disease without esophagitis: Secondary | ICD-10-CM | POA: Diagnosis present

## 2015-03-11 DIAGNOSIS — E1149 Type 2 diabetes mellitus with other diabetic neurological complication: Secondary | ICD-10-CM | POA: Diagnosis present

## 2015-03-11 DIAGNOSIS — E1165 Type 2 diabetes mellitus with hyperglycemia: Secondary | ICD-10-CM | POA: Diagnosis present

## 2015-03-11 DIAGNOSIS — R1110 Vomiting, unspecified: Secondary | ICD-10-CM

## 2015-03-11 DIAGNOSIS — Z8673 Personal history of transient ischemic attack (TIA), and cerebral infarction without residual deficits: Secondary | ICD-10-CM

## 2015-03-11 DIAGNOSIS — Z794 Long term (current) use of insulin: Secondary | ICD-10-CM | POA: Diagnosis present

## 2015-03-11 DIAGNOSIS — N1330 Unspecified hydronephrosis: Secondary | ICD-10-CM | POA: Diagnosis present

## 2015-03-11 DIAGNOSIS — Z9049 Acquired absence of other specified parts of digestive tract: Secondary | ICD-10-CM

## 2015-03-11 DIAGNOSIS — E119 Type 2 diabetes mellitus without complications: Secondary | ICD-10-CM | POA: Diagnosis present

## 2015-03-11 DIAGNOSIS — D638 Anemia in other chronic diseases classified elsewhere: Secondary | ICD-10-CM | POA: Diagnosis present

## 2015-03-11 DIAGNOSIS — Z823 Family history of stroke: Secondary | ICD-10-CM | POA: Diagnosis not present

## 2015-03-11 DIAGNOSIS — I6350 Cerebral infarction due to unspecified occlusion or stenosis of unspecified cerebral artery: Secondary | ICD-10-CM | POA: Insufficient documentation

## 2015-03-11 DIAGNOSIS — Z800 Family history of malignant neoplasm of digestive organs: Secondary | ICD-10-CM | POA: Diagnosis not present

## 2015-03-11 DIAGNOSIS — I10 Essential (primary) hypertension: Secondary | ICD-10-CM | POA: Diagnosis present

## 2015-03-11 DIAGNOSIS — R233 Spontaneous ecchymoses: Secondary | ICD-10-CM | POA: Diagnosis present

## 2015-03-11 DIAGNOSIS — Z90710 Acquired absence of both cervix and uterus: Secondary | ICD-10-CM

## 2015-03-11 DIAGNOSIS — E669 Obesity, unspecified: Secondary | ICD-10-CM | POA: Diagnosis present

## 2015-03-11 DIAGNOSIS — D649 Anemia, unspecified: Secondary | ICD-10-CM | POA: Insufficient documentation

## 2015-03-11 DIAGNOSIS — Z79899 Other long term (current) drug therapy: Secondary | ICD-10-CM | POA: Diagnosis not present

## 2015-03-11 LAB — CBC WITH DIFFERENTIAL/PLATELET
BASOS ABS: 0 10*3/uL (ref 0–0.1)
BASOS PCT: 0 %
EOS ABS: 0 10*3/uL (ref 0–0.7)
Eosinophils Relative: 0 %
HEMATOCRIT: 38 % (ref 35.0–47.0)
Hemoglobin: 12.5 g/dL (ref 12.0–16.0)
Lymphocytes Relative: 12 %
Lymphs Abs: 0.8 10*3/uL — ABNORMAL LOW (ref 1.0–3.6)
MCH: 27.6 pg (ref 26.0–34.0)
MCHC: 32.8 g/dL (ref 32.0–36.0)
MCV: 84 fL (ref 80.0–100.0)
MONO ABS: 0.2 10*3/uL (ref 0.2–0.9)
MONOS PCT: 3 %
NEUTROS ABS: 5.9 10*3/uL (ref 1.4–6.5)
Neutrophils Relative %: 85 %
PLATELETS: 148 10*3/uL — AB (ref 150–440)
RBC: 4.53 MIL/uL (ref 3.80–5.20)
RDW: 14.9 % — AB (ref 11.5–14.5)
WBC: 7 10*3/uL (ref 3.6–11.0)

## 2015-03-11 LAB — URINALYSIS COMPLETE WITH MICROSCOPIC (ARMC ONLY)
BILIRUBIN URINE: NEGATIVE
HGB URINE DIPSTICK: NEGATIVE
Leukocytes, UA: NEGATIVE
NITRITE: NEGATIVE
Protein, ur: >500 mg/dL — AB
SPECIFIC GRAVITY, URINE: 1.01 (ref 1.005–1.030)
Squamous Epithelial / LPF: NONE SEEN
pH: 7 (ref 5.0–8.0)

## 2015-03-11 LAB — TROPONIN I

## 2015-03-11 LAB — COMPREHENSIVE METABOLIC PANEL
ALK PHOS: 76 U/L (ref 38–126)
ALT: 14 U/L (ref 14–54)
ANION GAP: 9 (ref 5–15)
AST: 16 U/L (ref 15–41)
Albumin: 4.3 g/dL (ref 3.5–5.0)
BILIRUBIN TOTAL: 0.7 mg/dL (ref 0.3–1.2)
BUN: 15 mg/dL (ref 6–20)
CALCIUM: 9.3 mg/dL (ref 8.9–10.3)
CO2: 27 mmol/L (ref 22–32)
Chloride: 101 mmol/L (ref 101–111)
Creatinine, Ser: 0.61 mg/dL (ref 0.44–1.00)
GFR calc Af Amer: >60 mL/min (ref >60)
GLUCOSE: 255 mg/dL — AB (ref 65–99)
POTASSIUM: 3.3 mmol/L — AB (ref 3.5–5.1)
Sodium: 137 mmol/L (ref 135–145)
TOTAL PROTEIN: 8 g/dL (ref 6.5–8.1)

## 2015-03-11 LAB — GLUCOSE, CAPILLARY: GLUCOSE-CAPILLARY: 215 mg/dL — AB (ref 65–99)

## 2015-03-11 LAB — LIPASE, BLOOD: LIPASE: 25 U/L (ref 11–51)

## 2015-03-11 MED ORDER — INSULIN ASPART 100 UNIT/ML ~~LOC~~ SOLN
0.0000 [IU] | Freq: Four times a day (QID) | SUBCUTANEOUS | Status: DC
  Administered 2015-03-12 (×2): 2 [IU] via SUBCUTANEOUS
  Administered 2015-03-12 – 2015-03-13 (×3): 3 [IU] via SUBCUTANEOUS
  Administered 2015-03-13: 17:00:00 2 [IU] via SUBCUTANEOUS
  Administered 2015-03-13: 06:00:00 1 [IU] via SUBCUTANEOUS
  Administered 2015-03-14: 13:00:00 3 [IU] via SUBCUTANEOUS
  Administered 2015-03-14 (×2): 2 [IU] via SUBCUTANEOUS
  Administered 2015-03-14: 3 [IU] via SUBCUTANEOUS
  Administered 2015-03-15: 06:00:00 1 [IU] via SUBCUTANEOUS
  Administered 2015-03-15 (×2): 3 [IU] via SUBCUTANEOUS
  Administered 2015-03-15: 2 [IU] via SUBCUTANEOUS
  Administered 2015-03-16: 9 [IU] via SUBCUTANEOUS
  Administered 2015-03-16 (×2): 1 [IU] via SUBCUTANEOUS
  Filled 2015-03-11: qty 3
  Filled 2015-03-11 (×2): qty 2
  Filled 2015-03-11: qty 3
  Filled 2015-03-11: qty 2
  Filled 2015-03-11 (×2): qty 3
  Filled 2015-03-11: qty 1
  Filled 2015-03-11: qty 2
  Filled 2015-03-11: qty 1
  Filled 2015-03-11: qty 3
  Filled 2015-03-11: qty 1
  Filled 2015-03-11: qty 2
  Filled 2015-03-11: qty 3
  Filled 2015-03-11: qty 2
  Filled 2015-03-11: qty 1
  Filled 2015-03-11: qty 3
  Filled 2015-03-11: qty 9

## 2015-03-11 MED ORDER — ALPRAZOLAM 0.25 MG PO TABS
0.2500 mg | ORAL_TABLET | Freq: Two times a day (BID) | ORAL | Status: DC
  Administered 2015-03-12 – 2015-03-16 (×9): 0.25 mg via ORAL
  Filled 2015-03-11 (×10): qty 1

## 2015-03-11 MED ORDER — ACETAMINOPHEN 325 MG PO TABS
650.0000 mg | ORAL_TABLET | Freq: Four times a day (QID) | ORAL | Status: DC | PRN
Start: 2015-03-11 — End: 2015-03-16
  Administered 2015-03-12 – 2015-03-14 (×7): 650 mg via ORAL
  Filled 2015-03-11 (×9): qty 2

## 2015-03-11 MED ORDER — IOHEXOL 240 MG/ML SOLN
25.0000 mL | Freq: Once | INTRAMUSCULAR | Status: AC | PRN
  Administered 2015-03-11: 25 mL via INTRAVENOUS

## 2015-03-11 MED ORDER — ACETAMINOPHEN 650 MG RE SUPP: 650.0000 mg | Freq: Four times a day (QID) | RECTAL | Status: DC | PRN

## 2015-03-11 MED ORDER — STROKE: EARLY STAGES OF RECOVERY BOOK
Freq: Once | Status: AC
  Administered 2015-03-12

## 2015-03-11 MED ORDER — SODIUM CHLORIDE 0.9% FLUSH
3.0000 mL | Freq: Two times a day (BID) | INTRAVENOUS | Status: DC
  Administered 2015-03-12 – 2015-03-16 (×9): 3 mL via INTRAVENOUS

## 2015-03-11 MED ORDER — PROMETHAZINE HCL 25 MG/ML IJ SOLN
25.0000 mg | Freq: Once | INTRAMUSCULAR | Status: AC
  Administered 2015-03-11: 25 mg via INTRAVENOUS
  Filled 2015-03-11: qty 1

## 2015-03-11 MED ORDER — METOCLOPRAMIDE HCL 5 MG/ML IJ SOLN
10.0000 mg | Freq: Once | INTRAMUSCULAR | Status: AC
  Administered 2015-03-11: 10 mg via INTRAVENOUS

## 2015-03-11 MED ORDER — LEVOTHYROXINE SODIUM 75 MCG PO TABS
125.0000 ug | ORAL_TABLET | Freq: Every day | ORAL | Status: DC
  Administered 2015-03-12 – 2015-03-16 (×5): 125 ug via ORAL
  Filled 2015-03-11 (×6): qty 1

## 2015-03-11 MED ORDER — ONDANSETRON HCL 4 MG/2ML IJ SOLN
INTRAMUSCULAR | Status: AC
Start: 2015-03-11 — End: 2015-03-11
  Administered 2015-03-11: 4 mg via INTRAVENOUS
  Filled 2015-03-11: qty 2

## 2015-03-11 MED ORDER — ONDANSETRON HCL 4 MG/2ML IJ SOLN
4.0000 mg | Freq: Once | INTRAMUSCULAR | Status: AC
  Administered 2015-03-11: 4 mg via INTRAVENOUS

## 2015-03-11 MED ORDER — ASPIRIN EC 81 MG PO TBEC
81.0000 mg | DELAYED_RELEASE_TABLET | Freq: Every day | ORAL | Status: DC
  Administered 2015-03-12: 81 mg via ORAL
  Filled 2015-03-11: qty 1

## 2015-03-11 MED ORDER — PANTOPRAZOLE SODIUM 40 MG PO TBEC
40.0000 mg | DELAYED_RELEASE_TABLET | Freq: Every day | ORAL | Status: DC
Start: 2015-03-12 — End: 2015-03-16
  Administered 2015-03-12 – 2015-03-16 (×5): 40 mg via ORAL
  Filled 2015-03-11 (×5): qty 1

## 2015-03-11 MED ORDER — ONDANSETRON HCL 4 MG PO TABS
4.0000 mg | ORAL_TABLET | Freq: Four times a day (QID) | ORAL | Status: DC | PRN
  Administered 2015-03-13 – 2015-03-14 (×2): 4 mg via ORAL
  Filled 2015-03-11 (×2): qty 1

## 2015-03-11 MED ORDER — PROMETHAZINE HCL 25 MG/ML IJ SOLN
12.5000 mg | Freq: Four times a day (QID) | INTRAMUSCULAR | Status: DC | PRN
Start: 2015-03-11 — End: 2015-03-16
  Administered 2015-03-12: 12.5 mg via INTRAVENOUS
  Filled 2015-03-11: qty 1

## 2015-03-11 MED ORDER — ONDANSETRON HCL 4 MG/2ML IJ SOLN
4.0000 mg | Freq: Four times a day (QID) | INTRAMUSCULAR | Status: DC | PRN
  Administered 2015-03-12 (×2): 4 mg via INTRAVENOUS
  Filled 2015-03-11 (×2): qty 2

## 2015-03-11 MED ORDER — SODIUM CHLORIDE 0.9 % IV BOLUS (SEPSIS)
1000.0000 mL | Freq: Once | INTRAVENOUS | Status: AC
  Administered 2015-03-11: 1000 mL via INTRAVENOUS

## 2015-03-11 MED ORDER — METOCLOPRAMIDE HCL 5 MG/ML IJ SOLN
INTRAMUSCULAR | Status: AC
  Administered 2015-03-11: 10 mg via INTRAVENOUS
  Filled 2015-03-11: qty 2

## 2015-03-11 MED ORDER — VENLAFAXINE HCL ER 75 MG PO CP24
150.0000 mg | ORAL_CAPSULE | Freq: Two times a day (BID) | ORAL | Status: DC
  Administered 2015-03-12 – 2015-03-16 (×9): 150 mg via ORAL
  Filled 2015-03-11 (×10): qty 2

## 2015-03-11 MED ORDER — ONDANSETRON HCL 4 MG/2ML IJ SOLN
4.0000 mg | Freq: Once | INTRAMUSCULAR | Status: AC
  Administered 2015-03-11: 4 mg via INTRAVENOUS
  Filled 2015-03-11: qty 2

## 2015-03-11 MED ORDER — ENOXAPARIN SODIUM 40 MG/0.4ML ~~LOC~~ SOLN
40.0000 mg | Freq: Every day | SUBCUTANEOUS | Status: DC
  Administered 2015-03-12 – 2015-03-15 (×4): 40 mg via SUBCUTANEOUS
  Filled 2015-03-11 (×5): qty 0.4

## 2015-03-11 MED ORDER — IOHEXOL 300 MG/ML  SOLN
100.0000 mL | Freq: Once | INTRAMUSCULAR | Status: AC | PRN
  Administered 2015-03-11: 100 mL via INTRAVENOUS

## 2015-03-11 NOTE — Progress Notes (Signed)
Update - MRI showed acute infarct, stroke order set placed  Jacqulyn [facility] 03/11/2015, 11:02 PM

## 2015-03-11 NOTE — ED Notes (Signed)
Pt assisted back to bed. Pt's clothing removed and pt placed in a gown. Belongings placed in bag and necklace removed and given to husband. Family at bedside. Pt placed on monitor.

## 2015-03-11 NOTE — ED Notes (Signed)
MRI called and spoke with family. States they will be here in about one hour.

## 2015-03-11 NOTE — ED Notes (Signed)
Patient resting comfortably. BP is continuing to improve. Patient medicated for nausea. Family at bedside. Patient to be admitted to hospital.

## 2015-03-11 NOTE — ED Notes (Signed)
Family at bedside. Family states pt vomited x 1.

## 2015-03-11 NOTE — ED Notes (Signed)
Patient up to bathroom. Able to void. Unable to obtain measurement. Patient states relief. Placed back on monitor. Warm blanket given. Family at bedside. Will continue to monitor.

## 2015-03-11 NOTE — H&P (Signed)
[facility] NAME: Elizabeth Rivera    MR#:  [id number]  DATE OF BIRTH:  September 09, 1950  DATE OF ADMISSION:  03/11/2015  PRIMARY CARE PHYSICIAN: Elsie Stain, MD   REQUESTING/REFERRING PHYSICIAN: Reita Cliche, M.D.  CHIEF COMPLAINT:   Chief Complaint  Patient presents with  . Nausea  . Emesis    HISTORY OF PRESENT ILLNESS:  Elizabeth Rivera  is a 65 y.o. female who presents with intractable nausea and vomiting with significantly elevated blood pressure. Patient is unable to contribute much to her history today due to lethargy. Her husband and daughter are present with her in the ED and contribute to history. Patient woke up this morning nauseated and vomiting, and when her husband checked her blood pressure and found her systolic in the 568S he called to have her brought to the ED for evaluation. She's been here in the ED for some time. She had a CT abdomen which did not reveal any significant acute finding for her nausea vomiting. She has multiple rounds of antiemetics without any improvement in her symptoms. Due to her vomiting she had not taken her blood pressure medicines, these were given here in the ED with improvement in her blood pressure. Family states that in the past the patient had a stroke where her only symptoms were some period of time of "slowed thinking" with no other focal neurological deficits at that time.Marland Kitchen She was evaluated about a month after her symptoms started and was found to have had a stroke. Some concern upfront for recurrent stroke. CT head was negative, but MRI brain is pending. Hospitalists were called for admission.  PAST MEDICAL HISTORY:   Past Medical History  Diagnosis Date  . Thyroid disease     hypothyroid  . Anxiety   . Hyperlipidemia   . Hypertension   . Other chest pain     chest discomfort  . Obesity   . Neuropathy ([place])   . Other esophagitis     erosive esophagitis  . Other specified gastritis  without mention of hemorrhage     erosvie gastritis  . Pain in limb     Right leg pain  . Other specified disorders of adrenal glands 07/2007    adrenal mass via of CT 1.4cm left Adrenal no change(Dr.Cope)   . Other abnormal blood chemistry   . Diverticulosis of colon (without mention of hemorrhage) 05/20/2009    Internal hemms (Dr. Vira Agar)  . Irritable bowel syndrome     history of  . Insomnia, unspecified   . Nervous breakdown 08/2002    [facility]  . GERD (gastroesophageal reflux disease)     reflux HH 09/01/2002//egd reflux Esoph. HH Gastritis nonbleeding erosive gastropathy Duodentitis 08/15/2006  . Hyperglycemia   . Candidal vaginitis   . Forearm fracture     2013, left  . Anemia   . Anemia of other chronic disease 09/01/2013    PAST SURGICAL HISTORY:   Past Surgical History  Procedure Laterality Date  . Cholecystectomy  03/2004  . Abdominal hysterectomy  1995    Part Hyst BSO DUB ovarian cyst B9  . Vaginal delivery  1977  . Breast reduction surgery  1984  . Cystoscopy  01/08/2008    Former site of inflammation healed (Dr. Jacqlyn Larsen)  . Nsvd x1  1977  . Esophagogastroduodenoscopy  1. 09/01/02  2. 08/15/06    1. Reflux, HH  2. Reflux esoph HH gaastritis nonbleeding erosive gastropathy duodenitis  .  Colonoscopy  1. 08/15/06  2. 05/20/09    Int Hemms  2. Divertics Int Hemms (Dr. Vira Agar)  . Ct abd w & pelvis wo cm  6/09    CT Scan abd stable adrenal adenoma 1.4cm left adrenal no change (Dr. Jacqlyn Larsen)    SOCIAL HISTORY:   Social History  Substance Use Topics  . Smoking status: Never Smoker   . Smokeless tobacco: Never Used  . Alcohol Use: No    FAMILY HISTORY:   Family History  Problem Relation Age of Onset  . Hypertension Mother   . Hyperlipidemia Mother   . Stroke Mother     91  . Vision loss Father     vision problems  . Cancer Father     Colon   . Hyperlipidemia Sister   . Hypertension Sister   . Vision loss Sister     DRUG ALLERGIES:   Allergies   Allergen Reactions  . Sulfonamide Derivatives     REACTION: rash  . Sulfa Antibiotics Rash    MEDICATIONS AT HOME:   Prior to Admission medications   Medication Sig Start Date End Date Taking? Authorizing Provider  ALPRAZolam (XANAX) 0.25 MG tablet Take 1 tablet (0.25 mg total) by mouth 2 (two) times daily as needed. Patient taking differently: Take 0.25 mg by mouth 2 (two) times daily.  02/09/15  Yes Tonia Ghent, MD  aspirin EC 81 MG tablet Take 81 mg by mouth daily.   Yes Historical Provider, MD  Blood Glucose Monitoring Suppl (ONE TOUCH ULTRA 2) W/DEVICE KIT Use to test blood sugar twice daily and as directed.  Diagnosis:  E11.40  Insulin-dependent. 10/05/14  Yes Tonia Ghent, MD  cholecalciferol (VITAMIN D) 1000 units tablet Take 1,000 Units by mouth daily.   Yes Historical Provider, MD  ferrous sulfate 325 (65 FE) MG tablet Take 1 tablet (325 mg total) by mouth daily with breakfast. 08/03/13  Yes Tonia Ghent, MD  glucose blood (ONE TOUCH ULTRA TEST) test strip Use as instructed to test blood sugar twice daily and as directed.  Diagnosis:  E11.40  Insulin dependent. 10/05/14  Yes Tonia Ghent, MD  Insulin Glargine (LANTUS SOLOSTAR) 100 UNIT/ML Solostar Pen Inject 9 Units into the skin daily at 10 pm. If sugar 100-130, no change.  Add 1 unit if AM sugar >130. Dec 1 unit if <100. Patient taking differently: Inject 9 Units into the skin at bedtime. If sugar 100-130, no change.  Add 1 unit if AM sugar >130. Dec 1 unit if <100. 12/08/14  Yes Tonia Ghent, MD  Lancets [facility] ULTRASOFT) lancets Use as instructed to test blood sugar twice daily and as directed.  Diagnosis:  E11.40   Insulin-dependent. 10/05/14  Yes Tonia Ghent, MD  levothyroxine (SYNTHROID, LEVOTHROID) 125 MCG tablet Take 1 tablet (125 mcg total) by mouth daily before breakfast. 03/05/15  Yes Tonia Ghent, MD  lisinopril (PRINIVIL,ZESTRIL) 5 MG tablet Take 1 tablet (5 mg total) by mouth daily. 02/09/15  Yes Tonia Ghent, MD  metFORMIN (GLUCOPHAGE) 1000 MG tablet Take 0.5 tablets (500 mg total) by mouth 2 (two) times daily with a meal. 02/09/15  Yes Tonia Ghent, MD  metoprolol succinate (TOPROL XL) 100 MG 24 hr tablet Take 1 tablet (100 mg total) by mouth daily. 05/05/14  Yes Pleas Koch, NP  omeprazole (PRILOSEC) 40 MG capsule Take 1 capsule (40 mg total) by mouth 2 (two) times daily. 03/04/15  Yes Tonia Ghent,  MD  pregabalin (LYRICA) 75 MG capsule 2 tabs in AM and 1 in PM. Patient taking differently: Take 75-150 mg by mouth See admin instructions. 150 mg every morning and 75 mg every evening 02/09/15  Yes Tonia Ghent, MD  ranitidine (ZANTAC) 300 MG tablet Take 1 tablet (300 mg total) by mouth daily. 01/08/15  Yes Tonia Ghent, MD  venlafaxine XR (EFFEXOR-XR) 150 MG 24 hr capsule Take 1 capsule (150 mg total) by mouth 2 (two) times daily. 01/08/15  Yes Tonia Ghent, MD  vitamin B-12 (CYANOCOBALAMIN) 1000 MCG tablet Take 1,000 mcg by mouth 2 (two) times daily.     Yes Historical Provider, MD    REVIEW OF SYSTEMS:  Review of Systems  Constitutional: Positive for malaise/fatigue. Negative for fever, chills and weight loss.  HENT: Negative for ear pain, hearing loss and tinnitus.   Eyes: Negative for blurred vision, double vision, pain and redness.  Respiratory: Negative for cough, hemoptysis and shortness of breath.   Cardiovascular: Negative for chest pain, palpitations, orthopnea and leg swelling.  Gastrointestinal: Positive for nausea and vomiting. Negative for abdominal pain, diarrhea and constipation.  Genitourinary: Negative for dysuria, frequency and hematuria.  Musculoskeletal: Negative for back pain, joint pain and neck pain.  Skin:       No acne, rash, or lesions  Neurological: Positive for weakness. Negative for dizziness, tremors and focal weakness.  Endo/Heme/Allergies: Negative for polydipsia. Does not bruise/bleed easily.  Psychiatric/Behavioral: Negative for depression.  The patient is not nervous/anxious and does not have insomnia.     Review of systems is complete as above, but limited in reliability as the patient is responsive, but minimally so due to being so lethargic VITAL SIGNS:   Filed Vitals:   03/11/15 1915 03/11/15 1930 03/11/15 1945 03/11/15 2000  BP: 184/105 183/102  172/95  Pulse: 82 89 111 86  Temp:      TempSrc:      Resp:  14 16 15   Height:      Weight:      SpO2: 99% 98% 95% 99%   Wt Readings from Last 3 Encounters:  03/11/15 62.143 kg (137 lb)  03/04/15 63.957 kg (141 lb)  02/09/15 64.411 kg (142 lb)    PHYSICAL EXAMINATION:  Physical Exam  Vitals reviewed. Constitutional: She is oriented to person, place, and time. She appears well-developed and well-nourished. No distress.  HENT:  Head: Normocephalic and atraumatic.  Mouth/Throat: Oropharynx is clear and moist.  Eyes: Conjunctivae and EOM are normal. Pupils are equal, round, and reactive to light. No scleral icterus.  Neck: Normal range of motion. Neck supple. No JVD present. No thyromegaly present.  Cardiovascular: Normal rate, regular rhythm and intact distal pulses.  Exam reveals no gallop and no friction rub.   No murmur heard. Respiratory: Effort normal and breath sounds normal. No respiratory distress. She has no wheezes. She has no rales.  GI: Soft. Bowel sounds are normal. She exhibits no distension. There is no tenderness.  Musculoskeletal: Normal range of motion. She exhibits no edema.  No arthritis, no gout  Lymphadenopathy:    She has no cervical adenopathy.  Neurological: She is alert and oriented to person, place, and time. No cranial nerve deficit.  No dysarthria, no aphasia  Skin: Skin is warm and dry. No rash noted. No erythema.  Psychiatric: She has a normal mood and affect. Her behavior is normal. Judgment and thought content normal.    LABORATORY PANEL:   CBC  Recent Labs  Lab 03/11/15 1227  WBC 7.0  HGB 12.5  HCT 38.0  PLT 148*    ------------------------------------------------------------------------------------------------------------------  Chemistries   Recent Labs Lab 03/11/15 1227  NA 137  K 3.3*  CL 101  CO2 27  GLUCOSE 255*  BUN 15  CREATININE 0.61  CALCIUM 9.3  AST 16  ALT 14  ALKPHOS 76  BILITOT 0.7   ------------------------------------------------------------------------------------------------------------------  Cardiac Enzymes  Recent Labs Lab 03/11/15 1227  TROPONINI <0.03   ------------------------------------------------------------------------------------------------------------------  RADIOLOGY:  Ct Head Wo Contrast  03/11/2015  CLINICAL DATA:  The patient woke up at 4 a.m. last night with nausea and vomiting. Hypertension. Initial encounter. EXAM: CT HEAD WITHOUT CONTRAST TECHNIQUE: Contiguous axial images were obtained from the base of the skull through the vertex without intravenous contrast. COMPARISON:  Head CT scan 06/03/2014.  Brain MRI 05/27/2014. FINDINGS: Extensive chronic microvascular ischemic change is identified. No evidence of acute abnormality including infarct, hemorrhage, mass lesion, mass effect, midline shift or abnormal extra-axial fluid collection is seen. There is no hydrocephalus or pneumocephalus. The calvarium is intact. Imaged paranasal sinuses and mastoid air cells are clear. IMPRESSION: No acute abnormality. Extensive chronic microvascular ischemic change. Electronically Signed   By: Inge Rise M.D.   On: 03/11/2015 19:21   Ct Abdomen Pelvis W Contrast  03/11/2015  CLINICAL DATA:  Nausea and vomiting starting this morning at 4 a.m. EXAM: CT ABDOMEN AND PELVIS WITH CONTRAST TECHNIQUE: Multidetector CT imaging of the abdomen and pelvis was performed using the standard protocol following bolus administration of intravenous contrast. CONTRAST:  18m OMNIPAQUE IOHEXOL 300 MG/ML  SOLN COMPARISON:  05/06/2009 and lumbar spine 12/19/2011 FINDINGS: The lung  bases are unremarkable. Sagittal images of the spine shows mild degenerative changes thoracolumbar spine. There is diffuse osteopenia. There is mild compression deformity upper endplate of L1 vertebral body. Schmorl's node deformity noted upper endplate of TZ61vertebral body. The patient is status post cholecystectomy. No focal hepatic mass. Enhanced pancreas, spleen and right adrenal are stable. Stable fat containing lesion in left adrenal benign in nature measures 1.5 cm. Atherosclerotic calcifications of abdominal aorta and iliac arteries. No aortic aneurysm. There is mild right hydronephrosis and proximal right hydroureter. Mild dilatation of the left renal pelvis without frank hydronephrosis. No calcified ureteral calculi are noted bilaterally. There is a cyst in midpole posterior aspect of the right kidney measures 1.3 cm. Delayed renal images shows bilateral renal symmetrical excretion. Bilateral distal ureter is small caliber. Mild dilatation of mid right ureter. A distal right ureteral stricture cannot be excluded. Correlation with urology exam is recommended. No definite evidence of ureteral mass. Significant distended urinary bladder. The urinary bladder measures at least 19 cm cranial caudally by 8.8 cm AP diameter. There is no small bowel obstruction. No ascites or free air. No adenopathy. Normal appendix. No pericecal inflammation. Again noted a small supraumbilical hernia containing fat without evidence of acute complication. There is some loculated fluid umbilical region superficial measures 1.8 cm. Cellulitis or small umbilical abscess cannot be excluded. Clinical correlation is necessary. There is moderate stool noted in distal sigmoid colon and rectum. No colitis or diverticulitis. No distal colonic obstruction. Few diverticula are noted in descending colon. The patient is status post hysterectomy. IMPRESSION: 1. There is mild right hydronephrosis and proximal right hydroureter. No calcified  ureteral calculi are noted bilaterally. Delayed renal images shows bilateral renal symmetrical excretion. Correlation with urology exam is recommended to exclude subtle distal right ureteral stricture. 2. Significant distended urinary bladder. Urinary bladder measures  at least 19 cm cranial caudally by 9 cm AP diameter. 3. No small bowel obstruction. 4. Status postcholecystectomy. 5. Stable fat containing lesion in left adrenal gland benign in nature. 6. There is small amount of loculated fluid in the umbilical region please see axial image 46 measures about 1.8 cm. Cellulitis or small abscess cannot be excluded. Clinical correlation is necessary. 7. Stable small supraumbilical hernia containing fat without evidence of acute complication. 8. Status post hysterectomy. 9. Atherosclerotic calcifications of abdominal aorta and iliac arteries. Electronically Signed   By: Lahoma Crocker M.D.   On: 03/11/2015 16:58   Dg Abd Acute W/chest  03/11/2015  CLINICAL DATA:  Left upper quadrant pain, constipation, nausea starting this morning EXAM: DG ABDOMEN ACUTE W/ 1V CHEST COMPARISON:  01/27/2013 FINDINGS: Cardiomediastinal silhouette is stable. No acute infiltrate or pleural effusion. No pulmonary edema. Mild dextroscoliosis lower thoracic spine. Mild levoscoliosis lumbar spine. There is normal small bowel gas pattern. Postcholecystectomy surgical clips are noted. Moderate stool noted in distal sigmoid colon and rectum. No free abdominal air. IMPRESSION: Negative abdominal radiographs. No acute cardiopulmonary disease. No free abdominal air. Postcholecystectomy surgical clips are noted. Thoracolumbar scoliosis. Moderate stool are noted in distal sigmoid colon and rectum. Electronically Signed   By: Lahoma Crocker M.D.   On: 03/11/2015 13:55    EKG:   Orders placed or performed during the hospital encounter of 03/11/15  . EKG 12-Lead  . EKG 12-Lead    IMPRESSION AND PLAN:  Principal Problem:   Accelerated hypertension -  unclear for nausea and vomiting is due to a primary accelerated hypertension, or if her hypertension is compensatory post stroke. Stat MRI brain pending. We will follow this closely in order to know how to handle her blood pressure. If she is stroke negative, we will begin reducing her blood pressure with antihypertensive medications. If she is stroke positive, we will allow for permissive hypertension, and pursue stroke workup be a stroke order set. Active Problems:   Intractable nausea and vomiting - continue with IV when necessary antiemetics. This seems to control her vomiting some, though her nausea persists.   DM (diabetes mellitus), type 2 with neurological complications ([place]) - on oral anti-glycemic status at home, hold them here and use sliding scale insulin with corresponding glucose checks every 6 hours while nothing by mouth at present. If she is stroke negative then we can begin a diet and will change her insulin order to before meals at bedtime at that time.   Anxiety - continue home meds once patient is taking by mouth   Hypothyroidism - home meds when taking by mouth   HYPERLIPIDEMIA, MIXED - restart home meds when taking by mouth   GERD - oral PPI once taking by mouth  All the records are reviewed and case discussed with ED provider. Management plans discussed with the patient and/or family.  DVT PROPHYLAXIS: Subcutaneous Lovenox  GI PROPHYLAXIS: PPI  ADMISSION STATUS: Inpatient  CODE STATUS: Full Code Status History    This patient does not have a recorded code status. Please follow your organizational policy for patients in this situation.      TOTAL TIME TAKING CARE OF THIS PATIENT: 50 minutes.    Darvell Monteforte FIELDING 03/11/2015, 8:46 PM  [facility]  Office  [phone]  CC: Primary care physician; Elsie Stain, MD

## 2015-03-11 NOTE — ED Notes (Signed)
Pt moved into room 19. Pt assisted with ambulation to bathroom. Pt complains of weakness and dizziness. Staff remain at bedside with pt.

## 2015-03-11 NOTE — ED Provider Notes (Addendum)
[facility] Emergency Department Provider Note   ____________________________________________  Time seen: Approximately 12 PM I have reviewed the triage vital signs and the triage nursing note.  HISTORY  Chief Complaint Nausea and Emesis   Historian Patient, spouse and son  HPI Elizabeth Rivera is a 65 y.o. female with a history of hypertension and is here for vomiting since this morning. Numerous emesis, nonbloody and nonbilious. She thinks she may be dehydrated. Some mild abdominal cramping and history of constipation, but no diarrhea. No fever. No chills. Mild dry cough. No chest pain. Headache. Symptoms are moderate. She does take blood pressure pills.No aggravating or alleviating factors    Past Medical History  Diagnosis Date  . Thyroid disease     hypothyroid  . Anxiety   . Hyperlipidemia   . Hypertension   . Other chest pain     chest discomfort  . Obesity   . Neuropathy ([place])   . Other esophagitis     erosive esophagitis  . Other specified gastritis without mention of hemorrhage     erosvie gastritis  . Pain in limb     Right leg pain  . Other specified disorders of adrenal glands 07/2007    adrenal mass via of CT 1.4cm left Adrenal no change(Dr.Cope)   . Other abnormal blood chemistry   . Diverticulosis of colon (without mention of hemorrhage) 05/20/2009    Internal hemms (Dr. Vira Agar)  . Irritable bowel syndrome     history of  . Insomnia, unspecified   . Nervous breakdown 08/2002    [facility]  . GERD (gastroesophageal reflux disease)     reflux HH 09/01/2002//egd reflux Esoph. HH Gastritis nonbleeding erosive gastropathy Duodentitis 08/15/2006  . Hyperglycemia   . Candidal vaginitis   . Forearm fracture     2013, left  . Anemia   . Anemia of other chronic disease 09/01/2013    Patient Active Problem List   Diagnosis Date Noted  . Pinworm infection 01/10/2015  . Hand pain 09/09/2014  . Psychomotor retardation 05/25/2014   . Syncope, cardiogenic 05/05/2014  . Tremor 05/05/2014  . Multiple falls 11/28/2013  . Elevated immunoglobulin A 09/02/2013  . Anemia of other chronic disease 09/01/2013  . Barrett's esophagus 08/18/2013  . Other malaise and fatigue 07/29/2013  . Diarrhea 03/18/2013  . DM (diabetes mellitus), type 2 with neurological complications ([place]) 29/51/8841  . Bilateral leg pain 07/02/2012  . Urinary incontinence 09/25/2011  . Diaphoresis 12/21/2010  . Leg edema 05/11/2010  . B12 deficiency 05/11/2010  . BENIGN POSITIONAL VERTIGO 01/06/2009  . ABDOMINAL PAIN, LEFT UPPER QUADRANT 12/10/2008  . UNSPECIFIED VITAMIN D DEFICIENCY 09/07/2008  . ROSACEA 09/07/2008  . NEUROPATHY 08/24/2008  . Other chest pain 05/19/2008  . EROSIVE ESOPHAGITIS 02/03/2008  . EROSIVE GASTRITIS 02/03/2008  . Anxiety state 02/14/2007  . OBESITY 11/15/2006  . Extremity pain 09/28/2006  . ADRENAL MASS VIA CT 07/07/2006  . Hypothyroidism 07/05/2006  . HYPERLIPIDEMIA, MIXED 07/05/2006  . Essential hypertension 07/05/2006  . GERD 07/05/2006  . DIVERTICULOSIS, COLON 07/05/2006  . IBS 07/05/2006  . INSOMNIA 07/05/2006    Past Surgical History  Procedure Laterality Date  . Cholecystectomy  03/2004  . Abdominal hysterectomy  1995    Part Hyst BSO DUB ovarian cyst B9  . Vaginal delivery  1977  . Breast reduction surgery  1984  . Cystoscopy  01/08/2008    Former site of inflammation healed (Dr. Jacqlyn Larsen)  . Nsvd x1  1977  . Esophagogastroduodenoscopy  1. 09/01/02  2. 08/15/06    1. Reflux, HH  2. Reflux esoph HH gaastritis nonbleeding erosive gastropathy duodenitis  . Colonoscopy  1. 08/15/06  2. 05/20/09    Int Hemms  2. Divertics Int Hemms (Dr. Vira Agar)  . Ct abd w & pelvis wo cm  6/09    CT Scan abd stable adrenal adenoma 1.4cm left adrenal no change (Dr. Jacqlyn Larsen)    Current Outpatient Rx  Name  Route  Sig  Dispense  Refill  . ALPRAZolam (XANAX) 0.25 MG tablet   Oral   Take 1 tablet (0.25 mg total) by mouth 2 (two)  times daily as needed. Patient taking differently: Take 0.25 mg by mouth 2 (two) times daily.    180 tablet   1   . aspirin EC 81 MG tablet   Oral   Take 81 mg by mouth daily.         . Blood Glucose Monitoring Suppl (ONE TOUCH ULTRA 2) W/DEVICE KIT      Use to test blood sugar twice daily and as directed.  Diagnosis:  E11.40  Insulin-dependent.   1 each   0   . cholecalciferol (VITAMIN D) 1000 units tablet   Oral   Take 1,000 Units by mouth daily.         . ferrous sulfate 325 (65 FE) MG tablet   Oral   Take 1 tablet (325 mg total) by mouth daily with breakfast.         . glucose blood (ONE TOUCH ULTRA TEST) test strip      Use as instructed to test blood sugar twice daily and as directed.  Diagnosis:  E11.40  Insulin dependent.   200 each   3   . Insulin Glargine (LANTUS SOLOSTAR) 100 UNIT/ML Solostar Pen   Subcutaneous   Inject 9 Units into the skin daily at 10 pm. If sugar 100-130, no change.  Add 1 unit if AM sugar >130. Dec 1 unit if <100. Patient taking differently: Inject 9 Units into the skin at bedtime. If sugar 100-130, no change.  Add 1 unit if AM sugar >130. Dec 1 unit if <100.   15 mL      . Lancets (ONETOUCH ULTRASOFT) lancets      Use as instructed to test blood sugar twice daily and as directed.  Diagnosis:  E11.40   Insulin-dependent.   200 each   3   . levothyroxine (SYNTHROID, LEVOTHROID) 125 MCG tablet   Oral   Take 1 tablet (125 mcg total) by mouth daily before breakfast.   90 tablet   3   . lisinopril (PRINIVIL,ZESTRIL) 5 MG tablet   Oral   Take 1 tablet (5 mg total) by mouth daily.   90 tablet   3   . metFORMIN (GLUCOPHAGE) 1000 MG tablet   Oral   Take 0.5 tablets (500 mg total) by mouth 2 (two) times daily with a meal.   90 tablet   3   . metoprolol succinate (TOPROL XL) 100 MG 24 hr tablet   Oral   Take 1 tablet (100 mg total) by mouth daily.   30 tablet   3   . omeprazole (PRILOSEC) 40 MG capsule   Oral   Take 1  capsule (40 mg total) by mouth 2 (two) times daily.   180 capsule   3   . pregabalin (LYRICA) 75 MG capsule      2 tabs in AM and 1 in PM. Patient  taking differently: Take 75-150 mg by mouth See admin instructions. 150 mg every morning and 75 mg every evening   270 capsule   1   . ranitidine (ZANTAC) 300 MG tablet   Oral   Take 1 tablet (300 mg total) by mouth daily.   90 tablet   3   . venlafaxine XR (EFFEXOR-XR) 150 MG 24 hr capsule   Oral   Take 1 capsule (150 mg total) by mouth 2 (two) times daily.   180 capsule   3   . vitamin B-12 (CYANOCOBALAMIN) 1000 MCG tablet   Oral   Take 1,000 mcg by mouth 2 (two) times daily.             Allergies Sulfonamide derivatives and Sulfa antibiotics  Family History  Problem Relation Age of Onset  . Hypertension Mother   . Hyperlipidemia Mother   . Stroke Mother     39  . Vision loss Father     vision problems  . Cancer Father     Colon   . Hyperlipidemia Sister   . Hypertension Sister   . Vision loss Sister     Social History Social History  Substance Use Topics  . Smoking status: Never Smoker   . Smokeless tobacco: Never Used  . Alcohol Use: No    Review of Systems  Constitutional: Negative for fever. Eyes: Negative for visual changes. ENT: Negative for sore throat. Cardiovascular: Negative for chest pain. Respiratory: Negative for shortness of breath. Gastrointestinal: Negative for black or bloody stools. Genitourinary: Negative for dysuria. Musculoskeletal: Negative for back pain. Skin: Negative for rash. Neurological: Negative for headache. Mild dizziness. 10 point Review of Systems otherwise negative ____________________________________________   PHYSICAL EXAM:  VITAL SIGNS: ED Triage Vitals  Enc Vitals Group     BP 03/11/15 1137 224/127 mmHg     Pulse Rate 03/11/15 1137 75     Resp 03/11/15 1137 18     Temp 03/11/15 1137 97.4 F (36.3 C)     Temp Source 03/11/15 1137 Oral     SpO2  03/11/15 1137 98 %     Weight 03/11/15 1137 137 lb (62.143 kg)     Height 03/11/15 1137 5' 2"  (1.575 m)     Head Cir --      Peak Flow --      Pain Score 03/11/15 1137 5     Pain Loc --      Pain Edu? --      Excl. in [place]? --      Constitutional: Alert and oriented. Well appearing and in no distress. Eyes: Conjunctivae are normal. PERRL. Normal extraocular movements. ENT   Head: Normocephalic and atraumatic.   Nose: No congestion/rhinnorhea.   Mouth/Throat: Mucous membranes are moderately dry..   Neck: No stridor. Cardiovascular/Chest: Normal rate, regular rhythm.  No murmurs, rubs, or gallops. Respiratory: Normal respiratory effort without tachypnea nor retractions. Breath sounds are clear and equal bilaterally. No wheezes/rales/rhonchi. Gastrointestinal: Soft. No distention, no guarding, no rebound.  Mild tenderness suprapubically, patient states her bladder is full.  Genitourinary/rectal:Deferred Musculoskeletal: Nontender with normal range of motion in all extremities. No joint effusions.  No lower extremity tenderness.  No edema. Neurologic:  Normal speech and language. No gross or focal neurologic deficits are appreciated. Skin:  Skin is warm, dry and intact. No rash noted. Psychiatric: Mood and affect are normal. Speech and behavior are normal. Patient exhibits appropriate insight and judgment.  ____________________________________________   EKG I, Lisa Roca, MD,  the attending physician have personally viewed and interpreted all ECGs.  71 bpm. Normal sinus rhythm with sinus arrhythmia. Narrow, tall QRS. Normal axis. T-wave inversions inferiorly and laterally consistent with LVH with repolarization abnormality. ____________________________________________  LABS (pertinent positives/negatives)  Comprehensive metabolic panel significant for potassium 3.3 Troponin less than 0.03 White blood count 7.0, hemoglobin 12.5, platelet count 148 Lipase 25 Urinalysis  rare bacteria, trace ketones, 0-5 white blood cells and 0-5 red blood cells, negative for leukocytes  ____________________________________________  RADIOLOGY All Xrays were viewed by me. Imaging interpreted by Radiologist.  Chest and abdomen x-ray:  IMPRESSION: Negative abdominal radiographs. No acute cardiopulmonary disease. No free abdominal air. Postcholecystectomy surgical clips are noted. Thoracolumbar scoliosis. Moderate stool are noted in distal sigmoid colon and rectum.   CT abdomen and pelvis with contrast:  IMPRESSION: 1. There is mild right hydronephrosis and proximal right hydroureter. No calcified ureteral calculi are noted bilaterally. Delayed renal images shows bilateral renal symmetrical excretion. Correlation with urology exam is recommended to exclude subtle distal right ureteral stricture. 2. Significant distended urinary bladder. Urinary bladder measures at least 19 cm cranial caudally by 9 cm AP diameter. 3. No small bowel obstruction. 4. Status postcholecystectomy. 5. Stable fat containing lesion in left adrenal gland benign in nature. 6. There is small amount of loculated fluid in the umbilical region please see axial image 46 measures about 1.8 cm. Cellulitis or small abscess cannot be excluded. Clinical correlation is necessary. 7. Stable small supraumbilical hernia containing fat without evidence of acute complication. 8. Status post hysterectomy. 9. Atherosclerotic calcifications of abdominal aorta and iliac arteries __________  CT head noncontrast: IMPRESSION: No acute abnormality.  Extensive chronic microvascular ischemic change. ____________________________________________  PROCEDURES  Procedure(s) performed: None  Critical Care performed: None  ____________________________________________   ED COURSE / ASSESSMENT AND PLAN  Pertinent labs & imaging results that were available during my care of the patient were reviewed by me and  considered in my medical decision making (see chart for details).   Patient is here with what sounds like essentially isolated vomiting. I am most suspicious of likely viral illness.  Blood pressure was initially elevated and nitro paste was placed. Patient does have chronic hypertension is not complaining of any headache, or chest pain. I don't think this is hypertensive emergency.  Patient did have recurrent episodes of nausea. After 2 doses of Zofran and fluids, still nauseated with an episode of emesis. At this point I did discuss with family obtaining abdomen CT to rule out obstruction and she was complaining of a little bit of belly pain.  CT the abdomen pelvis did not show any acute cause of nausea and vomiting. Her bladder was distended, but she did get up and go the bathroom and had a large amount of output although it was not measured.  Patient was given her home blood pressure medicines and blood pressure did remain elevated around 180/104. Patient was pretty sleepy after the Phenergan. Had not been reporting headache.  Family states that she has been complaining  of dizziness. At this point we did discuss obtaining head CT, given the dizziness and the persistent vomiting along with the high blood pressure which should not come down after her blood pressure medications.  Her head CT shows no acute findings.  Patient did vomit one more time and she was given Reglan.  She will be admitted for observation and treatment of intractable nausea and vomiting.    CONSULTATIONS:   Hospitalist for admission   Patient /  Family / Caregiver informed of clinical course, medical decision-making process, and agree with plan.  ----------------------------------------- 8:35 PM on 03/11/2015 -----------------------------------------  In discussion with Dr. Jannifer Franklin, hospitalist, I will go ahead and place an order for stat MRI of the brain to rule out  stroke.   ___________________________________________   FINAL CLINICAL IMPRESSION(S) / ED DIAGNOSES   Final diagnoses:  Intractable vomiting without nausea, vomiting of unspecified type              Note: This dictation was prepared with Dragon dictation. Any transcriptional errors that result from this process are unintentional   Lisa Roca, MD 03/11/15 2015  Lisa Roca, MD 03/11/15 2035

## 2015-03-11 NOTE — ED Notes (Signed)
Patient transported to CT 

## 2015-03-11 NOTE — ED Notes (Signed)
EMS states that pt woke up around 4 am with nausea and vomiting. Pt states she just hasn't felt good this morning. Pt complains of pain under left breast. Pt has 1 inch of Nitro paste that she placed around 11 am for BP. EMS states pt's initial BP was 264/142. Pt was given 4 mg Zofran IV with relief.

## 2015-03-12 ENCOUNTER — Inpatient Hospital Stay (HOSPITAL_COMMUNITY)
Admit: 2015-03-12 | Discharge: 2015-03-12 | Disposition: A | Discharge status: Home / Self Care | Payer: PPO | Attending: Internal Medicine | Admitting: Internal Medicine

## 2015-03-12 ENCOUNTER — Inpatient Hospital Stay: Payer: PPO

## 2015-03-12 DIAGNOSIS — I6350 Cerebral infarction due to unspecified occlusion or stenosis of unspecified cerebral artery: Secondary | ICD-10-CM

## 2015-03-12 LAB — LIPID PANEL
CHOL/HDL RATIO: 3.9 ratio
Cholesterol: 250 mg/dL — ABNORMAL HIGH (ref 0–200)
HDL: 64 mg/dL (ref >40)
LDL Cholesterol: 160 mg/dL — ABNORMAL HIGH (ref 0–99)
Triglycerides: 129 mg/dL (ref <150)
VLDL: 26 mg/dL (ref 0–40)

## 2015-03-12 LAB — BASIC METABOLIC PANEL
Anion gap: 11 (ref 5–15)
BUN: 18 mg/dL (ref 6–20)
CO2: 25 mmol/L (ref 22–32)
Calcium: 9.2 mg/dL (ref 8.9–10.3)
Chloride: 101 mmol/L (ref 101–111)
Creatinine, Ser: 0.78 mg/dL (ref 0.44–1.00)
GFR calc Af Amer: >60 mL/min (ref >60)
GLUCOSE: 182 mg/dL — AB (ref 65–99)
POTASSIUM: 3.4 mmol/L — AB (ref 3.5–5.1)
Sodium: 137 mmol/L (ref 135–145)

## 2015-03-12 LAB — HEMOGLOBIN A1C: HEMOGLOBIN A1C: 7.4 % — AB (ref 4.0–6.0)

## 2015-03-12 LAB — CBC
HEMATOCRIT: 35.4 % (ref 35.0–47.0)
Hemoglobin: 11.9 g/dL — ABNORMAL LOW (ref 12.0–16.0)
MCH: 28.1 pg (ref 26.0–34.0)
MCHC: 33.7 g/dL (ref 32.0–36.0)
MCV: 83.3 fL (ref 80.0–100.0)
Platelets: 180 10*3/uL (ref 150–440)
RBC: 4.25 MIL/uL (ref 3.80–5.20)
RDW: 14.9 % — AB (ref 11.5–14.5)
WBC: 8.4 10*3/uL (ref 3.6–11.0)

## 2015-03-12 LAB — GLUCOSE, CAPILLARY
GLUCOSE-CAPILLARY: 132 mg/dL — AB (ref 65–99)
GLUCOSE-CAPILLARY: 220 mg/dL — AB (ref 65–99)
Glucose-Capillary: 164 mg/dL — ABNORMAL HIGH (ref 65–99)
Glucose-Capillary: 161 mg/dL — ABNORMAL HIGH (ref 65–99)

## 2015-03-12 MED ORDER — ATORVASTATIN CALCIUM 20 MG PO TABS
40.0000 mg | ORAL_TABLET | Freq: Every day | ORAL | Status: DC
  Administered 2015-03-12 – 2015-03-15 (×4): 40 mg via ORAL
  Filled 2015-03-12 (×4): qty 2

## 2015-03-12 MED ORDER — CLOPIDOGREL BISULFATE 75 MG PO TABS
75.0000 mg | ORAL_TABLET | Freq: Every day | ORAL | Status: DC
  Administered 2015-03-12 – 2015-03-16 (×5): 75 mg via ORAL
  Filled 2015-03-12 (×5): qty 1

## 2015-03-12 MED ORDER — GLUCERNA SHAKE PO LIQD
237.0000 mL | Freq: Two times a day (BID) | ORAL | Status: DC
  Administered 2015-03-12 – 2015-03-15 (×4): 237 mL via ORAL

## 2015-03-12 MED ORDER — SODIUM CHLORIDE 0.9 % IJ SOLN
INTRAMUSCULAR | Status: AC
  Administered 2015-03-12: 01:00:00 3 mL
  Filled 2015-03-12: qty 10

## 2015-03-12 MED ORDER — GADOBENATE DIMEGLUMINE 529 MG/ML IV SOLN
15.0000 mL | Freq: Once | INTRAVENOUS | Status: AC | PRN
  Administered 2015-03-12: 12 mL via INTRAVENOUS

## 2015-03-12 MED ORDER — INSULIN GLARGINE 100 UNIT/ML ~~LOC~~ SOLN
8.0000 [IU] | Freq: Every day | SUBCUTANEOUS | Status: DC
  Administered 2015-03-12 – 2015-03-16 (×4): 8 [IU] via SUBCUTANEOUS
  Filled 2015-03-12 (×8): qty 0.08

## 2015-03-12 MED ORDER — POTASSIUM CHLORIDE CRYS ER 20 MEQ PO TBCR
40.0000 meq | EXTENDED_RELEASE_TABLET | Freq: Once | ORAL | Status: AC
  Administered 2015-03-12: 17:00:00 40 meq via ORAL
  Filled 2015-03-12: qty 2

## 2015-03-12 NOTE — Plan of Care (Signed)
Problem: Education: Goal: Knowledge of disease or condition will improve Outcome: Progressing Pt likes to be called Pam    Past Medical History   Diagnosis  Date   .  Thyroid disease         hypothyroid   .  Anxiety     .  Hyperlipidemia     .  Hypertension     .  Other chest pain         chest discomfort   .  Obesity     .  Neuropathy ([place])     .  Other esophagitis         erosive esophagitis   .  Other specified gastritis without mention of hemorrhage         erosvie gastritis   .  Pain in limb         Right leg pain   .  Other specified disorders of adrenal glands  07/2007       adrenal mass via of CT 1.4cm left Adrenal no change(Dr.Cope)    .  Other abnormal blood chemistry     .  Diverticulosis of colon (without mention of hemorrhage)  05/20/2009       Internal hemms (Dr. Vira Agar)   .  Irritable bowel syndrome         history of   .  Insomnia, unspecified     .  Nervous breakdown  08/2002       [facility]   .  GERD (gastroesophageal reflux disease)         reflux HH 09/01/2002//egd reflux Esoph. HH Gastritis nonbleeding erosive gastropathy Duodentitis 08/15/2006   .  Hyperglycemia     .  Candidal vaginitis     .  Forearm fracture         2013, left   .  Anemia     .  Anemia of other chronic disease  09/01/2013          Pt is well controlled with home medications

## 2015-03-12 NOTE — Progress Notes (Signed)
Initial Nutrition Assessment   INTERVENTION:   Meals and Snacks: Cater to patient preferences; pt may benefit from Carb Modified diet order if po intake adequate. Will not send breakfast eggs per pt request. RD took  dinner order on visit. Medical Food Supplement Therapy: will recommend Glucerna Shake po BID, each supplement provides 220 kcal and 10 grams of protein   NUTRITION DIAGNOSIS:   Inadequate oral intake related to acute illness as evidenced by per patient/family report.  GOAL:   Patient will meet greater than or equal to 90% of their needs  MONITOR:    (energy Intake, Glucose Profile, Electrolyte and rneal Profile, Digestive System)  REASON FOR ASSESSMENT:   Malnutrition Screening Tool    ASSESSMENT:   Pt admitted with acute CVA.  Past Medical History  Diagnosis Date  . Thyroid disease     hypothyroid  . Anxiety   . Hyperlipidemia   . Hypertension   . Other chest pain     chest discomfort  . Obesity   . Neuropathy ([place])   . Other esophagitis     erosive esophagitis  . Other specified gastritis without mention of hemorrhage     erosvie gastritis  . Pain in limb     Right leg pain  . Other specified disorders of adrenal glands 07/2007    adrenal mass via of CT 1.4cm left Adrenal no change(Dr.Cope)   . Other abnormal blood chemistry   . Diverticulosis of colon (without mention of hemorrhage) 05/20/2009    Internal hemms (Dr. Vira Agar)  . Irritable bowel syndrome     history of  . Insomnia, unspecified   . Nervous breakdown 08/2002    [facility]  . GERD (gastroesophageal reflux disease)     reflux HH 09/01/2002//egd reflux Esoph. HH Gastritis nonbleeding erosive gastropathy Duodentitis 08/15/2006  . Hyperglycemia   . Candidal vaginitis   . Forearm fracture     2013, left  . Anemia   . Anemia of other chronic disease 09/01/2013     Diet Order:  Diet Heart Room service appropriate?: Yes; Fluid consistency:: Thin    Current Nutrition: Pt  reports eating 3/4 of sandwich at lunch today but that is all she has had.  Food/Nutrition-Related History: Pt reports usually not eating breakfast at all but usually eating 2 meals per day later in the day. Pt reports liking Ensure/Glucerna PTA.    Scheduled Medications:  . ALPRAZolam  0.25 mg Oral BID  . atorvastatin  40 mg Oral q1800  . clopidogrel  75 mg Oral Daily  . enoxaparin (LOVENOX) injection  40 mg Subcutaneous QHS  . feeding supplement (GLUCERNA SHAKE)  237 mL Oral BID BM  . insulin aspart  0-9 Units Subcutaneous Q6H  . insulin glargine  8 Units Subcutaneous QHS  . levothyroxine  125 mcg Oral QAC breakfast  . pantoprazole  40 mg Oral QAC breakfast  . potassium chloride  40 mEq Oral Once  . sodium chloride flush  3 mL Intravenous Q12H  . venlafaxine XR  150 mg Oral BID      Electrolyte/Renal Profile and Glucose Profile:   Recent Labs Lab 03/11/15 1227 03/12/15 0518  NA 137 137  K 3.3* 3.4*  CL 101 101  CO2 27 25  BUN 15 18  CREATININE 0.61 0.78  CALCIUM 9.3 9.2  GLUCOSE 255* 182*   Protein Profile:  Recent Labs Lab 03/11/15 1227  ALBUMIN 4.3    Gastrointestinal Profile: Last BM:  03/08/2015   Nutrition-Focused Physical  Exam Findings:  Unable to complete Nutrition-Focused physical exam at this time.    Weight Change: Pt reports now knowing her weight trend. Per [facility] weight loss of 7% in 7 months.   Height:   Ht Readings from Last 1 Encounters:  03/11/15 5' (1.524 m)    Weight:   Wt Readings from Last 1 Encounters:  03/11/15 136 lb (61.689 kg)   Wt Readings from Last 10 Encounters:  03/11/15 136 lb (61.689 kg)  03/04/15 141 lb (63.957 kg)  02/09/15 142 lb (64.411 kg)  01/25/15 140 lb (63.504 kg)  01/08/15 146 lb 8 oz (66.452 kg)  12/08/14 147 lb (66.679 kg)  10/02/14 147 lb (66.679 kg)  09/09/14 146 lb 8 oz (66.452 kg)  08/28/14 147 lb 8 oz (66.906 kg)  05/29/14 155 lb 14.4 oz (70.716 kg)    BMI:  Body mass index is 26.56  kg/(m^2).  Estimated Nutritional Needs:   Kcal:  BEE: 1093kcals, TEE: (IF 1.1-1.3)(AF 1.2) 1442-1704kcals  Protein:  68-80g protein (1.0-1.2g/kg)  Fluid:  1540-1875mL of fluid (25-71mL/kg)  EDUCATION NEEDS:   No education needs identified at this time   [place], RD, LDN Pager [phone] Weekend/On-Call Pager [phone]

## 2015-03-12 NOTE — Progress Notes (Signed)
OT Cancellation Note  Patient Details Name: Elizabeth Rivera MRN: [id number] DOB: 24-Aug-1950   Cancelled Treatment:    Reason Eval/Treat Not Completed: Patient at procedure or test/ unavailable. Patient off floor. Will re-atempt time permitting.  Sharon Mt 03/12/2015, 12:00 PM

## 2015-03-12 NOTE — Consult Note (Signed)
Referring Physician: Sudini    Chief Complaint: Nausea/vomiting  HPI: Elizabeth Rivera is an 65 y.o. female who reportedly earlier in the week was noted to have slurred speech by her sister.  ON the night prior to admission she was felt to have gone to bed at baseline.  On awakening she had severe nausea and vomiting.  Patient described dizziness as well that she characterizes as lightheadedness that was positional.  Patient presented for evaluation at that time.  No focal weakness noted.    Date last known well: Unable to determine Time last known well: Unable to determine tPA Given: No: Unable to determine LKW  Past Medical History  Diagnosis Date  . Thyroid disease     hypothyroid  . Anxiety   . Hyperlipidemia   . Hypertension   . Other chest pain     chest discomfort  . Obesity   . Neuropathy ([place])   . Other esophagitis     erosive esophagitis  . Other specified gastritis without mention of hemorrhage     erosvie gastritis  . Pain in limb     Right leg pain  . Other specified disorders of adrenal glands 07/2007    adrenal mass via of CT 1.4cm left Adrenal no change(Dr.Cope)   . Other abnormal blood chemistry   . Diverticulosis of colon (without mention of hemorrhage) 05/20/2009    Internal hemms (Dr. Vira Agar)  . Irritable bowel syndrome     history of  . Insomnia, unspecified   . Nervous breakdown 08/2002    [facility]  . GERD (gastroesophageal reflux disease)     reflux HH 09/01/2002//egd reflux Esoph. HH Gastritis nonbleeding erosive gastropathy Duodentitis 08/15/2006  . Hyperglycemia   . Candidal vaginitis   . Forearm fracture     2013, left  . Anemia   . Anemia of other chronic disease 09/01/2013    Past Surgical History  Procedure Laterality Date  . Cholecystectomy  03/2004  . Abdominal hysterectomy  1995    Part Hyst BSO DUB ovarian cyst B9  . Vaginal delivery  1977  . Breast reduction surgery  1984  . Cystoscopy  01/08/2008    Former site of  inflammation healed (Dr. Jacqlyn Larsen)  . Nsvd x1  1977  . Esophagogastroduodenoscopy  1. 09/01/02  2. 08/15/06    1. Reflux, HH  2. Reflux esoph HH gaastritis nonbleeding erosive gastropathy duodenitis  . Colonoscopy  1. 08/15/06  2. 05/20/09    Int Hemms  2. Divertics Int Hemms (Dr. Vira Agar)  . Ct abd w & pelvis wo cm  6/09    CT Scan abd stable adrenal adenoma 1.4cm left adrenal no change (Dr. Jacqlyn Larsen)    Family History  Problem Relation Age of Onset  . Hypertension Mother   . Hyperlipidemia Mother   . Stroke Mother     64  . Vision loss Father     vision problems  . Cancer Father     Colon   . Hyperlipidemia Sister   . Hypertension Sister   . Vision loss Sister    Social History:  reports that she has never smoked. She has never used smokeless tobacco. She reports that she does not drink alcohol or use illicit drugs.  Allergies:  Allergies  Allergen Reactions  . Sulfonamide Derivatives     REACTION: rash  . Sulfa Antibiotics Rash    Medications:  I have reviewed the patient's current medications. Prior to Admission:  Prescriptions prior to admission  Medication Sig Dispense Refill Last Dose  . ALPRAZolam (XANAX) 0.25 MG tablet Take 1 tablet (0.25 mg total) by mouth 2 (two) times daily as needed. (Patient taking differently: Take 0.25 mg by mouth 2 (two) times daily. ) 180 tablet 1 03/10/2015 at pm  . aspirin EC 81 MG tablet Take 81 mg by mouth daily.   03/10/2015 at am  . Blood Glucose Monitoring Suppl (ONE TOUCH ULTRA 2) W/DEVICE KIT Use to test blood sugar twice daily and as directed.  Diagnosis:  E11.40  Insulin-dependent. 1 each 0 Taking  . cholecalciferol (VITAMIN D) 1000 units tablet Take 1,000 Units by mouth daily.   03/10/2015 at am  . ferrous sulfate 325 (65 FE) MG tablet Take 1 tablet (325 mg total) by mouth daily with breakfast.   03/10/2015 at am  . glucose blood (ONE TOUCH ULTRA TEST) test strip Use as instructed to test blood sugar twice daily and as directed.  Diagnosis:   E11.40  Insulin dependent. 200 each 3 Taking  . Insulin Glargine (LANTUS SOLOSTAR) 100 UNIT/ML Solostar Pen Inject 9 Units into the skin daily at 10 pm. If sugar 100-130, no change.  Add 1 unit if AM sugar >130. Dec 1 unit if <100. (Patient taking differently: Inject 9 Units into the skin at bedtime. If sugar 100-130, no change.  Add 1 unit if AM sugar >130. Dec 1 unit if <100.) 15 mL  03/10/2015 at pm  . Lancets (ONETOUCH ULTRASOFT) lancets Use as instructed to test blood sugar twice daily and as directed.  Diagnosis:  E11.40   Insulin-dependent. 200 each 3 Taking  . levothyroxine (SYNTHROID, LEVOTHROID) 125 MCG tablet Take 1 tablet (125 mcg total) by mouth daily before breakfast. 90 tablet 3 03/10/2015 at am  . lisinopril (PRINIVIL,ZESTRIL) 5 MG tablet Take 1 tablet (5 mg total) by mouth daily. 90 tablet 3 03/10/2015 at am  . metFORMIN (GLUCOPHAGE) 1000 MG tablet Take 0.5 tablets (500 mg total) by mouth 2 (two) times daily with a meal. 90 tablet 3 03/10/2015 at pm  . metoprolol succinate (TOPROL XL) 100 MG 24 hr tablet Take 1 tablet (100 mg total) by mouth daily. 30 tablet 3 03/10/2015 at 0900  . omeprazole (PRILOSEC) 40 MG capsule Take 1 capsule (40 mg total) by mouth 2 (two) times daily. 180 capsule 3 03/10/2015 at am  . pregabalin (LYRICA) 75 MG capsule 2 tabs in AM and 1 in PM. (Patient taking differently: Take 75-150 mg by mouth See admin instructions. 150 mg every morning and 75 mg every evening) 270 capsule 1 03/10/2015 at pm  . ranitidine (ZANTAC) 300 MG tablet Take 1 tablet (300 mg total) by mouth daily. 90 tablet 3 03/10/2015 at am  . venlafaxine XR (EFFEXOR-XR) 150 MG 24 hr capsule Take 1 capsule (150 mg total) by mouth 2 (two) times daily. 180 capsule 3 03/10/2015 at pm  . vitamin B-12 (CYANOCOBALAMIN) 1000 MCG tablet Take 1,000 mcg by mouth 2 (two) times daily.     03/10/2015 at pm   Scheduled: . ALPRAZolam  0.25 mg Oral BID  . aspirin EC  81 mg Oral Daily  . atorvastatin  40 mg Oral q1800  . enoxaparin  (LOVENOX) injection  40 mg Subcutaneous QHS  . insulin aspart  0-9 Units Subcutaneous Q6H  . levothyroxine  125 mcg Oral QAC breakfast  . pantoprazole  40 mg Oral QAC breakfast  . sodium chloride flush  3 mL Intravenous Q12H  . venlafaxine XR  150 mg Oral  BID    ROS: History obtained from the patient  General ROS: negative for - chills, fatigue, fever, night sweats, weight gain or weight loss Psychological ROS: negative for - behavioral disorder, hallucinations, memory difficulties, mood swings or suicidal ideation Ophthalmic ROS: negative for - blurry vision, double vision, eye pain or loss of vision ENT ROS: tooth pain Allergy and Immunology ROS: negative for - hives or itchy/watery eyes Hematological and Lymphatic ROS: negative for - bleeding problems, bruising or swollen lymph nodes Endocrine ROS: negative for - galactorrhea, hair pattern changes, polydipsia/polyuria or temperature intolerance Respiratory ROS: negative for - cough, hemoptysis, shortness of breath or wheezing Cardiovascular ROS: negative for - chest pain, dyspnea on exertion, edema or irregular heartbeat Gastrointestinal ROS:  as noted in HPI Genito-Urinary ROS: negative for - dysuria, hematuria, incontinence or urinary frequency/urgency Musculoskeletal ROS: negative for - joint swelling or muscular weakness Neurological ROS: as noted in HPI Dermatological ROS: negative for rash and skin lesion changes  Physical Examination: Blood pressure 165/78, pulse 84, temperature 98.2 F (36.8 C), temperature source Oral, resp. rate 18, height 5' (1.524 m), weight 61.689 kg (136 lb), SpO2 95 %.  HEENT-  Normocephalic, no lesions, without obvious abnormality.  Normal external eye and conjunctiva.  Normal TM's bilaterally.  Normal auditory canals and external ears. Normal external nose, mucus membranes and septum.  Normal pharynx. Cardiovascular- S1, S2 normal, pulses palpable throughout   Lungs- chest clear, no wheezing,  rales, normal symmetric air entry Abdomen- soft, non-tender; bowel sounds normal; no masses,  no organomegaly Extremities- no edema Lymph-no adenopathy palpable Musculoskeletal-no joint tenderness, deformity or swelling Skin-warm and dry, no hyperpigmentation, vitiligo, or suspicious lesions  Neurological Examination Mental Status: Alert, oriented, thought content appropriate.  Speech fluent but delayed.  Able to follow 3 step commands without difficulty. Cranial Nerves: II: Discs flat bilaterally; Visual fields grossly normal, pupils equal, round, reactive to light and accommodation III,IV, VI: ptosis not present, extra-ocular motions intact bilaterally V,VII: decrease in right NLF, facial light touch sensation normal bilaterally VIII: hearing normal bilaterally IX,X: gag reflex present XI: bilateral shoulder shrug XII: midline tongue extension Motor: Right : Upper extremity   5/5 with pronator drift   Left:     Upper extremity   5/5  Lower extremity   5/5       Lower extremity   5/5 Tone and bulk:normal tone throughout; no atrophy noted Sensory: Pinprick and light touch intact throughout, bilaterally Deep Tendon Reflexes: 2+ and symmetric with absent AJ's bilaterally Plantars: Right: downgoing   Left: downgoing Cerebellar: normal finger-to-nose and normal heel-to-shin testing bilaterally Gait: not tested due to safety concerns.   Laboratory Studies:  Basic Metabolic Panel:  Recent Labs Lab 03/11/15 1227 03/12/15 0518  NA 137 137  K 3.3* 3.4*  CL 101 101  CO2 27 25  GLUCOSE 255* 182*  BUN 15 18  CREATININE 0.61 0.78  CALCIUM 9.3 9.2    Liver Function Tests:  Recent Labs Lab 03/11/15 1227  AST 16  ALT 14  ALKPHOS 76  BILITOT 0.7  PROT 8.0  ALBUMIN 4.3    Recent Labs Lab 03/11/15 1227  LIPASE 25   No results for input(s): AMMONIA in the last 168 hours.  CBC:  Recent Labs Lab 03/11/15 1227 03/12/15 0518  WBC 7.0 8.4  NEUTROABS 5.9  --   HGB  12.5 11.9*  HCT 38.0 35.4  MCV 84.0 83.3  PLT 148* 180    Cardiac Enzymes:  Recent Labs Lab 03/11/15  [place] <0.03    BNP: Invalid input(s): POCBNP  CBG:  Recent Labs Lab 03/11/15 2344 03/12/15 0602  GLUCAP 215* 164*    Microbiology: Results for orders placed or performed in visit on 03/04/15  Ova and Parasite Examination     Status: None   Collection Time: 03/08/15  2:35 PM  Result Value Ref Range Status   OP No Ova or Parasites Seen   Final   OP Moderate  Final   OP Yeast  Final    Coagulation Studies: No results for input(s): LABPROT, INR in the last 72 hours.  Urinalysis:  Recent Labs Lab 03/11/15 1227  COLORURINE YELLOW*  LABSPEC 1.010  PHURINE 7.0  GLUCOSEU >500*  HGBUR NEGATIVE  BILIRUBINUR NEGATIVE  KETONESUR TRACE*  PROTEINUR >500*  NITRITE NEGATIVE  LEUKOCYTESUR NEGATIVE    Lipid Panel:    Component Value Date/Time   CHOL 250* 03/12/2015 0518   TRIG 129 03/12/2015 0518   HDL 64 03/12/2015 0518   CHOLHDL 3.9 03/12/2015 0518   VLDL 26 03/12/2015 0518   LDLCALC 160* 03/12/2015 0518    HgbA1C:  Lab Results  Component Value Date   HGBA1C 7.6* 01/04/2015    Urine Drug Screen:  No results found for: LABOPIA, COCAINSCRNUR, LABBENZ, AMPHETMU, THCU, LABBARB  Alcohol Level: No results for input(s): ETH in the last 168 hours.  Other results: EKG: sinus rhythm at 71 bpm.  Imaging: Ct Head Wo Contrast  03/11/2015  CLINICAL DATA:  The patient woke up at 4 a.m. last night with nausea and vomiting. Hypertension. Initial encounter. EXAM: CT HEAD WITHOUT CONTRAST TECHNIQUE: Contiguous axial images were obtained from the base of the skull through the vertex without intravenous contrast. COMPARISON:  Head CT scan 06/03/2014.  Brain MRI 05/27/2014. FINDINGS: Extensive chronic microvascular ischemic change is identified. No evidence of acute abnormality including infarct, hemorrhage, mass lesion, mass effect, midline shift or abnormal  extra-axial fluid collection is seen. There is no hydrocephalus or pneumocephalus. The calvarium is intact. Imaged paranasal sinuses and mastoid air cells are clear. IMPRESSION: No acute abnormality. Extensive chronic microvascular ischemic change. Electronically Signed   By: Inge Rise M.D.   On: 03/11/2015 19:21   Mr Angiogram Neck W Wo Contrast  03/12/2015  CLINICAL DATA:  Lethargy, nausea and vomiting.  Hypertension. EXAM: MRA NECK WITHOUT AND WITH CONTRAST MRA HEAD WITHOUT CONTRAST TECHNIQUE: Multiplanar and multiecho pulse sequences of the neck were obtained without and with intravenous contrast. Angiographic images of the neck were obtained using MRA technique without and with intravenous contast.; Angiographic images of the Circle of Willis were obtained using MRA technique without intravenous contrast. CONTRAST:  70m MULTIHANCE GADOBENATE DIMEGLUMINE 529 MG/ML IV SOLN COMPARISON:  MRI 03/11/2015 FINDINGS: MRA NECK FINDINGS Branching pattern of the brachiocephalic vessels from the arch is normal. Both common carotid arteries are widely patent to the bifurcation region. Both carotid bifurcations are widely patent without ICA stenosis or irregularity. There is mild narrowing of the proximal external carotid arteries. Both vertebral arteries are widely patent without significant proximal stenosis. Both appear normal through the cervical region. MRA HEAD FINDINGS Both internal carotid arteries are patent through the skullbase and siphon regions. The anterior and middle cerebral vessels are patent. Vessels show mild irregularity consistent with intracranial atherosclerotic change. There is an approximately 50% stenosis in the A1 segment on the right. More distal MCA branches vessels show some atherosclerotic irregularity, more extensive on the right than the left. There may even be a missing branch  or 2 on the right in the temporal region. Both vertebral arteries are patent at the foramen magnum with the  left being dominant. Both vertebral arteries reach the basilar. The distal right vertebral artery shows 50% narrowing. There is no basilar stenosis. Superior cerebellar and posterior cerebral arteries are patent, with the right PCA receiving most of it supply from the anterior circulation. More distal branch vessels in the PCA territories showed narrowing and irregularity. IMPRESSION: No significant disease in the neck. Wide patency of the carotid bifurcations and of the proximal vertebral arteries. Intracranial atherosclerotic disease with narrowing and irregularity of the medium and small vessels. Diminished number of MCA branches on the right compared to the left consistent with recent infarction in the right temporal lobe. 50% stenosis of the A1 segment on the right. Fairly considerable atherosclerotic irregularity of the more distal PCA branch vessels bilaterally. Electronically Signed   By: Nelson Chimes M.D.   On: 03/12/2015 11:48   Mr Brain Wo Contrast  03/11/2015  CLINICAL DATA:  Lethargy, nausea and vomiting. Hypertensive. History of hypertension, hyperlipidemia, diabetes. EXAM: MRI HEAD WITHOUT CONTRAST TECHNIQUE: Multiplanar, multiecho pulse sequences of the brain and surrounding structures were obtained without intravenous contrast. COMPARISON:  CT head March 11, 2015 at 1913 hours FINDINGS: Fast protocol used. 1 cm focus of reduced diffusion RIGHT posterior temporal lobe with corresponding low ADC value. The ventricles and sulci are normal for patient's age. No mass lesions, mass effect. Confluent supratentorial and to lesser extent pontine white matter FLAIR T2 hyperintensities. Old RIGHT basal ganglia lacunar infarct. A few scattered peripheral punctate foci of susceptibility artifact can be seen in setting of chronic hypertension. No lobar hematoma. No abnormal extra-axial fluid collections. No extra-axial masses though, contrast enhanced sequences would be more sensitive. Normal major  intracranial vascular flow voids seen at the skull base. Ocular globes and orbital contents are unremarkable though not tailored for evaluation. No abnormal sellar expansion. No suspicious calvarial bone marrow signal. Craniocervical junction maintained. Visualized paranasal sinuses and mastoid air cells are well-aerated. IMPRESSION: 1 cm acute RIGHT posterior temporal lobe infarct. Severe white matter changes compatible with chronic small vessel ischemic disease. Old RIGHT basal ganglia lacunar infarct. Electronically Signed   By: Elon Alas M.D.   On: 03/11/2015 22:47   Ct Abdomen Pelvis W Contrast  03/11/2015  CLINICAL DATA:  Nausea and vomiting starting this morning at 4 a.m. EXAM: CT ABDOMEN AND PELVIS WITH CONTRAST TECHNIQUE: Multidetector CT imaging of the abdomen and pelvis was performed using the standard protocol following bolus administration of intravenous contrast. CONTRAST:  126m OMNIPAQUE IOHEXOL 300 MG/ML  SOLN COMPARISON:  05/06/2009 and lumbar spine 12/19/2011 FINDINGS: The lung bases are unremarkable. Sagittal images of the spine shows mild degenerative changes thoracolumbar spine. There is diffuse osteopenia. There is mild compression deformity upper endplate of L1 vertebral body. Schmorl's node deformity noted upper endplate of TD14vertebral body. The patient is status post cholecystectomy. No focal hepatic mass. Enhanced pancreas, spleen and right adrenal are stable. Stable fat containing lesion in left adrenal benign in nature measures 1.5 cm. Atherosclerotic calcifications of abdominal aorta and iliac arteries. No aortic aneurysm. There is mild right hydronephrosis and proximal right hydroureter. Mild dilatation of the left renal pelvis without frank hydronephrosis. No calcified ureteral calculi are noted bilaterally. There is a cyst in midpole posterior aspect of the right kidney measures 1.3 cm. Delayed renal images shows bilateral renal symmetrical excretion. Bilateral distal  ureter is small caliber. Mild dilatation of mid  right ureter. A distal right ureteral stricture cannot be excluded. Correlation with urology exam is recommended. No definite evidence of ureteral mass. Significant distended urinary bladder. The urinary bladder measures at least 19 cm cranial caudally by 8.8 cm AP diameter. There is no small bowel obstruction. No ascites or free air. No adenopathy. Normal appendix. No pericecal inflammation. Again noted a small supraumbilical hernia containing fat without evidence of acute complication. There is some loculated fluid umbilical region superficial measures 1.8 cm. Cellulitis or small umbilical abscess cannot be excluded. Clinical correlation is necessary. There is moderate stool noted in distal sigmoid colon and rectum. No colitis or diverticulitis. No distal colonic obstruction. Few diverticula are noted in descending colon. The patient is status post hysterectomy. IMPRESSION: 1. There is mild right hydronephrosis and proximal right hydroureter. No calcified ureteral calculi are noted bilaterally. Delayed renal images shows bilateral renal symmetrical excretion. Correlation with urology exam is recommended to exclude subtle distal right ureteral stricture. 2. Significant distended urinary bladder. Urinary bladder measures at least 19 cm cranial caudally by 9 cm AP diameter. 3. No small bowel obstruction. 4. Status postcholecystectomy. 5. Stable fat containing lesion in left adrenal gland benign in nature. 6. There is small amount of loculated fluid in the umbilical region please see axial image 46 measures about 1.8 cm. Cellulitis or small abscess cannot be excluded. Clinical correlation is necessary. 7. Stable small supraumbilical hernia containing fat without evidence of acute complication. 8. Status post hysterectomy. 9. Atherosclerotic calcifications of abdominal aorta and iliac arteries. Electronically Signed   By: Lahoma Crocker M.D.   On: 03/11/2015 16:58   Dg Abd  Acute W/chest  03/11/2015  CLINICAL DATA:  Left upper quadrant pain, constipation, nausea starting this morning EXAM: DG ABDOMEN ACUTE W/ 1V CHEST COMPARISON:  01/27/2013 FINDINGS: Cardiomediastinal silhouette is stable. No acute infiltrate or pleural effusion. No pulmonary edema. Mild dextroscoliosis lower thoracic spine. Mild levoscoliosis lumbar spine. There is normal small bowel gas pattern. Postcholecystectomy surgical clips are noted. Moderate stool noted in distal sigmoid colon and rectum. No free abdominal air. IMPRESSION: Negative abdominal radiographs. No acute cardiopulmonary disease. No free abdominal air. Postcholecystectomy surgical clips are noted. Thoracolumbar scoliosis. Moderate stool are noted in distal sigmoid colon and rectum. Electronically Signed   By: Lahoma Crocker M.D.   On: 03/11/2015 13:55   Mr Lovenia Kim  03/12/2015  CLINICAL DATA:  Lethargy, nausea and vomiting.  Hypertension. EXAM: MRA NECK WITHOUT AND WITH CONTRAST MRA HEAD WITHOUT CONTRAST TECHNIQUE: Multiplanar and multiecho pulse sequences of the neck were obtained without and with intravenous contrast. Angiographic images of the neck were obtained using MRA technique without and with intravenous contast.; Angiographic images of the Circle of Willis were obtained using MRA technique without intravenous contrast. CONTRAST:  1m MULTIHANCE GADOBENATE DIMEGLUMINE 529 MG/ML IV SOLN COMPARISON:  MRI 03/11/2015 FINDINGS: MRA NECK FINDINGS Branching pattern of the brachiocephalic vessels from the arch is normal. Both common carotid arteries are widely patent to the bifurcation region. Both carotid bifurcations are widely patent without ICA stenosis or irregularity. There is mild narrowing of the proximal external carotid arteries. Both vertebral arteries are widely patent without significant proximal stenosis. Both appear normal through the cervical region. MRA HEAD FINDINGS Both internal carotid arteries are patent through the skullbase  and siphon regions. The anterior and middle cerebral vessels are patent. Vessels show mild irregularity consistent with intracranial atherosclerotic change. There is an approximately 50% stenosis in the A1 segment on the right. More distal MCA  branches vessels show some atherosclerotic irregularity, more extensive on the right than the left. There may even be a missing branch or 2 on the right in the temporal region. Both vertebral arteries are patent at the foramen magnum with the left being dominant. Both vertebral arteries reach the basilar. The distal right vertebral artery shows 50% narrowing. There is no basilar stenosis. Superior cerebellar and posterior cerebral arteries are patent, with the right PCA receiving most of it supply from the anterior circulation. More distal branch vessels in the PCA territories showed narrowing and irregularity. IMPRESSION: No significant disease in the neck. Wide patency of the carotid bifurcations and of the proximal vertebral arteries. Intracranial atherosclerotic disease with narrowing and irregularity of the medium and small vessels. Diminished number of MCA branches on the right compared to the left consistent with recent infarction in the right temporal lobe. 50% stenosis of the A1 segment on the right. Fairly considerable atherosclerotic irregularity of the more distal PCA branch vessels bilaterally. Electronically Signed   By: Nelson Chimes M.D.   On: 03/12/2015 11:48    Assessment: 65 y.o. female presenting with nausea and vomiting.  Neurological examination is unchanged from baseline with only some mild speech and right sided weakness noted.  MRI of the brain personally reviewed and shows a small right posterior temporal acute infarct.  MRA shows no hemodynamically significant stenosis.  Lipid panel shows an elevated LDL at 160.  Echocardiogram pending.    Stroke Risk Factors - hyperlipidemia and hypertension  Plan: 1. HgbA1c pending 2. Agree with initiation  of a statin for lipid management 3. PT consult, OT consult, Speech consult 4. Echocardiogram pending 5. Prophylactic therapy-Antiplatelet med: Plavix - dose 51m daily 6. NPO until RN stroke swallow screen 7. Telemetry monitoring 8. Frequent neuro checks 9. Follow up with Dr. JTomi Likensafter discharge.     LAlexis Goodell MD Neurology [phone]/04/2015, 12:38 PM

## 2015-03-12 NOTE — Progress Notes (Signed)
[facility] [place] NAME: Elizabeth Rivera    MR#:  [id number]  DATE OF BIRTH:  [birth year]  SUBJECTIVE:  CHIEF COMPLAINT:   Chief Complaint  Patient presents with  . Nausea  . Emesis   Admitted for vomiting. Found to have acute CVA on MRI. Patient's vomiting is improved but still has significant nausea. Doesn't complain of any focal neurological deficits. She did have a CVA 1 year back which left few cognitive deficits according to the family.  REVIEW OF SYSTEMS:    Review of Systems  Constitutional: Positive for malaise/fatigue. Negative for fever and chills.  HENT: Negative for sore throat.   Eyes: Negative for blurred vision, double vision and pain.  Respiratory: Negative for cough, hemoptysis, shortness of breath and wheezing.   Cardiovascular: Negative for chest pain, palpitations, orthopnea and leg swelling.  Gastrointestinal: Positive for nausea and vomiting. Negative for heartburn, abdominal pain, diarrhea and constipation.  Genitourinary: Negative for dysuria and hematuria.  Musculoskeletal: Negative for back pain and joint pain.  Skin: Negative for rash.  Neurological: Negative for sensory change, speech change, focal weakness and headaches.  Endo/Heme/Allergies: Does not bruise/bleed easily.  Psychiatric/Behavioral: Positive for memory loss. Negative for depression. The patient is not nervous/anxious.       DRUG ALLERGIES:   Allergies  Allergen Reactions  . Sulfonamide Derivatives     REACTION: rash  . Sulfa Antibiotics Rash    VITALS:  Blood pressure 165/78, pulse 84, temperature 98.2 F (36.8 C), temperature source Oral, resp. rate 18, height 5' (1.524 m), weight 61.689 kg (136 lb), SpO2 95 %.  PHYSICAL EXAMINATION:   Physical Exam  GENERAL:  65 y.o.-year-old patient lying in the bed with no acute distress.  EYES: Pupils equal, round, reactive to light and accommodation. No scleral icterus. Extraocular  muscles intact.  HEENT: Head atraumatic, normocephalic. Oropharynx and nasopharynx clear.  NECK:  Supple, no jugular venous distention. No thyroid enlargement, no tenderness.  LUNGS: Normal breath sounds bilaterally, no wheezing, rales, rhonchi. No use of accessory muscles of respiration.  CARDIOVASCULAR: S1, S2 normal. No murmurs, rubs, or gallops.  ABDOMEN: Soft, nontender, nondistended. Bowel sounds present. No organomegaly or mass.  EXTREMITIES: No cyanosis, clubbing or edema b/l.    NEUROLOGIC: Cranial nerves II through XII are intact. No focal Motor or sensory deficits b/l.   Right pronator drift PSYCHIATRIC: The patient is alert and oriented x 3. Slow to respond SKIN: No obvious rash, lesion, or ulcer.    LABORATORY PANEL:   CBC  Recent Labs Lab 03/12/15 0518  WBC 8.4  HGB 11.9*  HCT 35.4  PLT 180   ------------------------------------------------------------------------------------------------------------------  Chemistries   Recent Labs Lab 03/11/15 1227 03/12/15 0518  NA 137 137  K 3.3* 3.4*  CL 101 101  CO2 27 25  GLUCOSE 255* 182*  BUN 15 18  CREATININE 0.61 0.78  CALCIUM 9.3 9.2  AST 16  --   ALT 14  --   ALKPHOS 76  --   BILITOT 0.7  --    ------------------------------------------------------------------------------------------------------------------  Cardiac Enzymes  Recent Labs Lab 03/11/15 1227  TROPONINI <0.03   ------------------------------------------------------------------------------------------------------------------  RADIOLOGY:  Ct Head Wo Contrast  03/11/2015  CLINICAL DATA:  The patient woke up at 4 a.m. last night with nausea and vomiting. Hypertension. Initial encounter. EXAM: CT HEAD WITHOUT CONTRAST TECHNIQUE: Contiguous axial images were obtained from the base of the skull through the vertex without intravenous contrast. COMPARISON:  Head CT  scan 06/03/2014.  Brain MRI 05/27/2014. FINDINGS: Extensive chronic microvascular  ischemic change is identified. No evidence of acute abnormality including infarct, hemorrhage, mass lesion, mass effect, midline shift or abnormal extra-axial fluid collection is seen. There is no hydrocephalus or pneumocephalus. The calvarium is intact. Imaged paranasal sinuses and mastoid air cells are clear. IMPRESSION: No acute abnormality. Extensive chronic microvascular ischemic change. Electronically Signed   By: Inge Rise M.D.   On: 03/11/2015 19:21   Mr Angiogram Neck W Wo Contrast  03/12/2015  CLINICAL DATA:  Lethargy, nausea and vomiting.  Hypertension. EXAM: MRA NECK WITHOUT AND WITH CONTRAST MRA HEAD WITHOUT CONTRAST TECHNIQUE: Multiplanar and multiecho pulse sequences of the neck were obtained without and with intravenous contrast. Angiographic images of the neck were obtained using MRA technique without and with intravenous contast.; Angiographic images of the Circle of Willis were obtained using MRA technique without intravenous contrast. CONTRAST:  26mL MULTIHANCE GADOBENATE DIMEGLUMINE 529 MG/ML IV SOLN COMPARISON:  MRI 03/11/2015 FINDINGS: MRA NECK FINDINGS Branching pattern of the brachiocephalic vessels from the arch is normal. Both common carotid arteries are widely patent to the bifurcation region. Both carotid bifurcations are widely patent without ICA stenosis or irregularity. There is mild narrowing of the proximal external carotid arteries. Both vertebral arteries are widely patent without significant proximal stenosis. Both appear normal through the cervical region. MRA HEAD FINDINGS Both internal carotid arteries are patent through the skullbase and siphon regions. The anterior and middle cerebral vessels are patent. Vessels show mild irregularity consistent with intracranial atherosclerotic change. There is an approximately 50% stenosis in the A1 segment on the right. More distal MCA branches vessels show some atherosclerotic irregularity, more extensive on the right than the  left. There may even be a missing branch or 2 on the right in the temporal region. Both vertebral arteries are patent at the foramen magnum with the left being dominant. Both vertebral arteries reach the basilar. The distal right vertebral artery shows 50% narrowing. There is no basilar stenosis. Superior cerebellar and posterior cerebral arteries are patent, with the right PCA receiving most of it supply from the anterior circulation. More distal branch vessels in the PCA territories showed narrowing and irregularity. IMPRESSION: No significant disease in the neck. Wide patency of the carotid bifurcations and of the proximal vertebral arteries. Intracranial atherosclerotic disease with narrowing and irregularity of the medium and small vessels. Diminished number of MCA branches on the right compared to the left consistent with recent infarction in the right temporal lobe. 50% stenosis of the A1 segment on the right. Fairly considerable atherosclerotic irregularity of the more distal PCA branch vessels bilaterally. Electronically Signed   By: Nelson Chimes M.D.   On: 03/12/2015 11:48   Mr Brain Wo Contrast  03/11/2015  CLINICAL DATA:  Lethargy, nausea and vomiting. Hypertensive. History of hypertension, hyperlipidemia, diabetes. EXAM: MRI HEAD WITHOUT CONTRAST TECHNIQUE: Multiplanar, multiecho pulse sequences of the brain and surrounding structures were obtained without intravenous contrast. COMPARISON:  CT head March 11, 2015 at 1913 hours FINDINGS: Fast protocol used. 1 cm focus of reduced diffusion RIGHT posterior temporal lobe with corresponding low ADC value. The ventricles and sulci are normal for patient's age. No mass lesions, mass effect. Confluent supratentorial and to lesser extent pontine white matter FLAIR T2 hyperintensities. Old RIGHT basal ganglia lacunar infarct. A few scattered peripheral punctate foci of susceptibility artifact can be seen in setting of chronic hypertension. No lobar hematoma.  No abnormal extra-axial fluid collections. No extra-axial masses though,  contrast enhanced sequences would be more sensitive. Normal major intracranial vascular flow voids seen at the skull base. Ocular globes and orbital contents are unremarkable though not tailored for evaluation. No abnormal sellar expansion. No suspicious calvarial bone marrow signal. Craniocervical junction maintained. Visualized paranasal sinuses and mastoid air cells are well-aerated. IMPRESSION: 1 cm acute RIGHT posterior temporal lobe infarct. Severe white matter changes compatible with chronic small vessel ischemic disease. Old RIGHT basal ganglia lacunar infarct. Electronically Signed   By: Elon Alas M.D.   On: 03/11/2015 22:47   Ct Abdomen Pelvis W Contrast  03/11/2015  CLINICAL DATA:  Nausea and vomiting starting this morning at 4 a.m. EXAM: CT ABDOMEN AND PELVIS WITH CONTRAST TECHNIQUE: Multidetector CT imaging of the abdomen and pelvis was performed using the standard protocol following bolus administration of intravenous contrast. CONTRAST:  125mL OMNIPAQUE IOHEXOL 300 MG/ML  SOLN COMPARISON:  05/06/2009 and lumbar spine 12/19/2011 FINDINGS: The lung bases are unremarkable. Sagittal images of the spine shows mild degenerative changes thoracolumbar spine. There is diffuse osteopenia. There is mild compression deformity upper endplate of L1 vertebral body. Schmorl's node deformity noted upper endplate of 624THL vertebral body. The patient is status post cholecystectomy. No focal hepatic mass. Enhanced pancreas, spleen and right adrenal are stable. Stable fat containing lesion in left adrenal benign in nature measures 1.5 cm. Atherosclerotic calcifications of abdominal aorta and iliac arteries. No aortic aneurysm. There is mild right hydronephrosis and proximal right hydroureter. Mild dilatation of the left renal pelvis without frank hydronephrosis. No calcified ureteral calculi are noted bilaterally. There is a cyst in midpole  posterior aspect of the right kidney measures 1.3 cm. Delayed renal images shows bilateral renal symmetrical excretion. Bilateral distal ureter is small caliber. Mild dilatation of mid right ureter. A distal right ureteral stricture cannot be excluded. Correlation with urology exam is recommended. No definite evidence of ureteral mass. Significant distended urinary bladder. The urinary bladder measures at least 19 cm cranial caudally by 8.8 cm AP diameter. There is no small bowel obstruction. No ascites or free air. No adenopathy. Normal appendix. No pericecal inflammation. Again noted a small supraumbilical hernia containing fat without evidence of acute complication. There is some loculated fluid umbilical region superficial measures 1.8 cm. Cellulitis or small umbilical abscess cannot be excluded. Clinical correlation is necessary. There is moderate stool noted in distal sigmoid colon and rectum. No colitis or diverticulitis. No distal colonic obstruction. Few diverticula are noted in descending colon. The patient is status post hysterectomy. IMPRESSION: 1. There is mild right hydronephrosis and proximal right hydroureter. No calcified ureteral calculi are noted bilaterally. Delayed renal images shows bilateral renal symmetrical excretion. Correlation with urology exam is recommended to exclude subtle distal right ureteral stricture. 2. Significant distended urinary bladder. Urinary bladder measures at least 19 cm cranial caudally by 9 cm AP diameter. 3. No small bowel obstruction. 4. Status postcholecystectomy. 5. Stable fat containing lesion in left adrenal gland benign in nature. 6. There is small amount of loculated fluid in the umbilical region please see axial image 46 measures about 1.8 cm. Cellulitis or small abscess cannot be excluded. Clinical correlation is necessary. 7. Stable small supraumbilical hernia containing fat without evidence of acute complication. 8. Status post hysterectomy. 9.  Atherosclerotic calcifications of abdominal aorta and iliac arteries. Electronically Signed   By: Lahoma Crocker M.D.   On: 03/11/2015 16:58   Dg Abd Acute W/chest  03/11/2015  CLINICAL DATA:  Left upper quadrant pain, constipation, nausea starting this morning  EXAM: DG ABDOMEN ACUTE W/ 1V CHEST COMPARISON:  01/27/2013 FINDINGS: Cardiomediastinal silhouette is stable. No acute infiltrate or pleural effusion. No pulmonary edema. Mild dextroscoliosis lower thoracic spine. Mild levoscoliosis lumbar spine. There is normal small bowel gas pattern. Postcholecystectomy surgical clips are noted. Moderate stool noted in distal sigmoid colon and rectum. No free abdominal air. IMPRESSION: Negative abdominal radiographs. No acute cardiopulmonary disease. No free abdominal air. Postcholecystectomy surgical clips are noted. Thoracolumbar scoliosis. Moderate stool are noted in distal sigmoid colon and rectum. Electronically Signed   By: Lahoma Crocker M.D.   On: 03/11/2015 13:55   Mr Lovenia Kim  03/12/2015  CLINICAL DATA:  Lethargy, nausea and vomiting.  Hypertension. EXAM: MRA NECK WITHOUT AND WITH CONTRAST MRA HEAD WITHOUT CONTRAST TECHNIQUE: Multiplanar and multiecho pulse sequences of the neck were obtained without and with intravenous contrast. Angiographic images of the neck were obtained using MRA technique without and with intravenous contast.; Angiographic images of the Circle of Willis were obtained using MRA technique without intravenous contrast. CONTRAST:  23mL MULTIHANCE GADOBENATE DIMEGLUMINE 529 MG/ML IV SOLN COMPARISON:  MRI 03/11/2015 FINDINGS: MRA NECK FINDINGS Branching pattern of the brachiocephalic vessels from the arch is normal. Both common carotid arteries are widely patent to the bifurcation region. Both carotid bifurcations are widely patent without ICA stenosis or irregularity. There is mild narrowing of the proximal external carotid arteries. Both vertebral arteries are widely patent without significant  proximal stenosis. Both appear normal through the cervical region. MRA HEAD FINDINGS Both internal carotid arteries are patent through the skullbase and siphon regions. The anterior and middle cerebral vessels are patent. Vessels show mild irregularity consistent with intracranial atherosclerotic change. There is an approximately 50% stenosis in the A1 segment on the right. More distal MCA branches vessels show some atherosclerotic irregularity, more extensive on the right than the left. There may even be a missing branch or 2 on the right in the temporal region. Both vertebral arteries are patent at the foramen magnum with the left being dominant. Both vertebral arteries reach the basilar. The distal right vertebral artery shows 50% narrowing. There is no basilar stenosis. Superior cerebellar and posterior cerebral arteries are patent, with the right PCA receiving most of it supply from the anterior circulation. More distal branch vessels in the PCA territories showed narrowing and irregularity. IMPRESSION: No significant disease in the neck. Wide patency of the carotid bifurcations and of the proximal vertebral arteries. Intracranial atherosclerotic disease with narrowing and irregularity of the medium and small vessels. Diminished number of MCA branches on the right compared to the left consistent with recent infarction in the right temporal lobe. 50% stenosis of the A1 segment on the right. Fairly considerable atherosclerotic irregularity of the more distal PCA branch vessels bilaterally. Electronically Signed   By: Nelson Chimes M.D.   On: 03/12/2015 11:48     ASSESSMENT AND PLAN:   * Acute right temporal CVA - MRI done. Echo and carotids pending. - Start Plavix and statin. - Lovenox for DVT prophylaxis. - PT and OT consult. - Neuro checks every 4 hours for 24 hours. - Consulted neurology and discussed with Dr. Doy Mince.  * Nausea and vomiting Likely from CVA and elevated blood pressure. Slowly  improving. Symptomatically treatment.  * Hypertension Hold medications for permissive hypertension. We will use IV medications if blood pressure greater than 220/120.  * Diabetes mellitus type 2 Continue home medications along with sliding scale insulin. Restart Lantus at 8 units at bedtime.  * DVT prophylaxis  on Lovenox   All the records are reviewed and case discussed with Care Management/Social Workerr. Management plans discussed with the patient, family and they are in agreement.  CODE STATUS: FULL  DVT Prophylaxis: SCDs  TOTAL TIME TAKING CARE OF THIS PATIENT: 35 minutes.   POSSIBLE D/C IN 1-2 DAYS, DEPENDING ON CLINICAL CONDITION.   Hillary Bow R M.D on 03/12/2015 at 1:20 PM  Between 7am to 6pm - Pager - [phone]  After 6pm go to [email or web address] - password [email or web address] [facility]  Office  [phone]  CC: Primary care physician; Elsie Stain, MD    Note: This dictation was prepared with Dragon dictation along with smaller phrase technology. Any transcriptional errors that result from this process are unintentional.

## 2015-03-12 NOTE — Progress Notes (Signed)
*  PRELIMINARY RESULTS* Echocardiogram 2D Echocardiogram has been performed.  Elizabeth Rivera 03/12/2015, 7:10 PM

## 2015-03-12 NOTE — Progress Notes (Signed)
PT Cancellation Note  Patient Details Name: Elizabeth Rivera MRN: [id number] DOB: 22-Apr-1950   Cancelled Treatment:    Reason Eval/Treat Not Completed: Patient at procedure or test/unavailable (Pt off floor at imaging.)  Will re-attempt PT eval at a later date/time.   Raquel Sarna Rajanae Mantia 03/12/2015, 10:46 AM Leitha Bleak, [place]

## 2015-03-12 NOTE — Progress Notes (Signed)
OT Cancellation Note  Patient Details Name: Elizabeth Rivera MRN: [id number] DOB: 10/31/1950   Cancelled Treatment:    Reason Eval/Treat Not Completed: Other (comment). 2nd attempt, patient using the bathroom.  Sharon Mt 03/12/2015, 3:30 PM

## 2015-03-12 NOTE — Evaluation (Signed)
Occupational Therapy Evaluation Patient Details Name: Elizabeth Rivera MRN: [id number] DOB: May 13, 1950 Today's Date: 03/12/2015    History of Present Illness This patient is a 65 year old female who came to [facility] with vomiting. MRI results are 1 cm acute RIGHT posterior temporal lobe infarct.Severe white matter changes compatible with chronic small vessel ischemic disease. Old RIGHT basal ganglia lacunar infarct.   Clinical Impression   This patient is a 65 year old female who came to [facility] with vomiting. She lives in a one story home with her husband. She was independent with BADL but needed assist for IADL. She has good upper extremity strength and sensation is normal. She shows deficits in balance and activities of daily living and would benefit from Occupational Therapy for ADL/functional mobility training.    Follow Up Recommendations  SNF    Equipment Recommendations       Recommendations for Other Services       Precautions / Restrictions Precautions Precautions: Fall Restrictions Weight Bearing Restrictions: No      Mobility Bed Mobility               General bed mobility comments: supine to sit and sit to supine minimal assist. can sit unassisted for 45 seconds then becomes dizzy and needs to lay down.  Transfers Overall transfer level:  (Unable as she was unable to sit unassisted for to long)                    Balance Overall balance assessment: Needs assistance                                          ADL                                         General ADL Comments: Had been independent with Basic ADL assist with IADL. She now will need assist for toileting, dressing as she is unable to maintain sitting for more than 45 seconds.       Vision     Perception     Praxis      Pertinent Vitals/Pain Pain Assessment:  (tooth ache)     Hand Dominance     Extremity/Trunk Assessment Upper Extremity  Assessment Upper Extremity Assessment:  (B UE 5/5 through out except for right tricep 4+/5. grip is R 35 lbs - L 30 lbs sensation intact for light touch, sharp, temp and stereognosis. 9 hole peg test  R 57 seconds L 47 seconds. )   Lower Extremity Assessment Lower Extremity Assessment: Defer to PT evaluation       Communication     Cognition Arousal/Alertness:  (A little sleepy) Behavior During Therapy: WFL for tasks assessed/performed Overall Cognitive Status:  (oriented x 2 - somewhat flat affect.)                     General Comments       Exercises       Shoulder Instructions      Home Living Family/patient expects to be discharged to:: Private residence Living Arrangements: Spouse/significant other Available Help at Discharge: Family Type of Home: House       Home Layout: One level     Bathroom Shower/Tub: Tub/shower unit;Walk-in shower (preferes  the tub, has a bench.)         Home Equipment: Cane - single point          Prior Functioning/Environment          Comments: Patient was independent with basic ADL. dresses, bathes and toilets independently, could make a sandwich and light cooking.    OT Diagnosis:  (cva)   OT Problem List: Decreased activity tolerance;Impaired balance (sitting and/or standing);Decreased coordination   OT Treatment/Interventions: Self-care/ADL training    OT Goals(Current goals can be found in the care plan section) Acute Rehab OT Goals Patient Stated Goal: to get better OT Goal Formulation: With patient/family Time For Goal Achievement: 03/26/15 Potential to Achieve Goals: Good  OT Frequency: Min 1X/week   Barriers to D/C:            Co-evaluation              End of Session Equipment Utilized During Treatment:  (stroke test kit )  Activity Tolerance:   Patient left: in bed;with call bell/phone within reach;with bed alarm set;with family/visitor present   Time: 2761-8485 OT Time Calculation  (min): 25 min Charges:  OT General Charges $OT Visit: 1 Procedure OT Evaluation $OT Eval Low Complexity: 1 Procedure G-Codes:    Myrene Galas, MS/OTR/L   03/12/2015, 4:55 PM

## 2015-03-12 NOTE — Evaluation (Signed)
Clinical/Bedside Swallow Evaluation Patient Details  Name: Elizabeth Rivera MRN: [id number] Date of Birth: Dec 29, 1950  Today's Date: 03/12/2015 Time: SLP Start Time (ACUTE ONLY): [id number] SLP Stop Time (ACUTE ONLY): 1415 SLP Time Calculation (min) (ACUTE ONLY): 30 min  Past Medical History:  Past Medical History  Diagnosis Date  . Thyroid disease     hypothyroid  . Anxiety   . Hyperlipidemia   . Hypertension   . Other chest pain     chest discomfort  . Obesity   . Neuropathy ([place])   . Other esophagitis     erosive esophagitis  . Other specified gastritis without mention of hemorrhage     erosvie gastritis  . Pain in limb     Right leg pain  . Other specified disorders of adrenal glands 07/2007    adrenal mass via of CT 1.4cm left Adrenal no change(Dr.Cope)   . Other abnormal blood chemistry   . Diverticulosis of colon (without mention of hemorrhage) 05/20/2009    Internal hemms (Dr. Vira Agar)  . Irritable bowel syndrome     history of  . Insomnia, unspecified   . Nervous breakdown 08/2002    [facility]  . GERD (gastroesophageal reflux disease)     reflux HH 09/01/2002//egd reflux Esoph. HH Gastritis nonbleeding erosive gastropathy Duodentitis 08/15/2006  . Hyperglycemia   . Candidal vaginitis   . Forearm fracture     2013, left  . Anemia   . Anemia of other chronic disease 09/01/2013   Past Surgical History:  Past Surgical History  Procedure Laterality Date  . Cholecystectomy  03/2004  . Abdominal hysterectomy  1995    Part Hyst BSO DUB ovarian cyst B9  . Vaginal delivery  1977  . Breast reduction surgery  1984  . Cystoscopy  01/08/2008    Former site of inflammation healed (Dr. Jacqlyn Larsen)  . Nsvd x1  1977  . Esophagogastroduodenoscopy  1. 09/01/02  2. 08/15/06    1. Reflux, HH  2. Reflux esoph HH gaastritis nonbleeding erosive gastropathy duodenitis  . Colonoscopy  1. 08/15/06  2. 05/20/09    Int Hemms  2. Divertics Int Hemms (Dr. Vira Agar)  . Ct abd w & pelvis wo cm  6/09     CT Scan abd stable adrenal adenoma 1.4cm left adrenal no change (Dr. Jacqlyn Larsen)   HPI:  Elizabeth Rivera is a 65 y.o. female who presents with intractable nausea and vomiting with significantly elevated blood pressure. Patient is unable to contribute much to her history today due to lethargy. Her husband and daughter are present with her in the ED and contribute to history. Patient woke up this morning nauseated and vomiting, and when her husband checked her blood pressure and found her systolic in the 123456 he called to have her brought to the ED for evaluation. She's been here in the ED for some time. She had a CT abdomen which did not reveal any significant acute finding for her nausea vomiting. She has multiple rounds of antiemetics without any improvement in her symptoms. Due to her vomiting she had not taken her blood pressure medicines, these were given here in the ED with improvement in her blood pressure. Family states that in the past the patient had a stroke where her only symptoms were some period of time of "slowed thinking" with no other focal neurological deficits at that time.Marland Kitchen She was evaluated about a month after her symptoms started and was found to have had a stroke. Some concern upfront for  recurrent stroke. CT head was negative, but MRI brain is pending.   Assessment / Plan / Recommendation Clinical Impression  Pt presents w/no apparent oropharyngeal dysphagia. Pt demonstrated no overt s/s of aspiration w/any tested consistency. Vocal quality remained clear throughout and laryngeal elevation appeared adequate. Oral phase appeared [facility]. No pocketing or holding was observed. Pt was able to adequately masticate solid consistency and A-P transit appeared appropriate. Oral mech exam revelaed oral motor abilities to be [facility] Recommend continue w/regular diet w/thin liquids and aspiration precautions. Pt is mild increased risk of aspiraiton d/t possible cognitive changes and lethargy. Pt noted to have  increased response time to questions, but unsure if this is related to lethargy or if it is indicative or receptive aphasia. Will f/u and further evaluate as indicated.     Aspiration Risk  Mild aspiration risk    Diet Recommendation Regular;Thin liquid   Liquid Administration via: Cup;Straw Medication Administration: Whole meds with liquid Supervision: Patient able to self feed Compensations: Minimize environmental distractions;Slow rate;Small sips/bites Postural Changes: Seated upright at 90 degrees    Other  Recommendations Oral Care Recommendations: Oral care BID;Patient independent with oral care   Follow up Recommendations  None    Frequency and Duration min 2x/week  1 week       Prognosis Prognosis for Safe Diet Advancement: Good      Swallow Study   General Date of Onset: 03/12/15 HPI: Elizabeth Rivera is a 65 y.o. female who presents with intractable nausea and vomiting with significantly elevated blood pressure. Patient is unable to contribute much to her history today due to lethargy. Her husband and daughter are present with her in the ED and contribute to history. Patient woke up this morning nauseated and vomiting, and when her husband checked her blood pressure and found her systolic in the [id number] he called to have her brought to the ED for evaluation. She's been here in the ED for some time. She had a CT abdomen which did not reveal any significant acute finding for her nausea vomiting. She has multiple rounds of antiemetics without any improvement in her symptoms. Due to her vomiting she had not taken her blood pressure medicines, these were given here in the ED with improvement in her blood pressure. Family states that in the past the patient had a stroke where her only symptoms were some period of time of "slowed thinking" with no other focal neurological deficits at that time.Marland Kitchen She was evaluated about a month after her symptoms started and was found to have had a stroke.  Some concern upfront for recurrent stroke. CT head was negative, but MRI brain is pending. Type of Study: Bedside Swallow Evaluation Previous Swallow Assessment: none Diet Prior to this Study: Regular;Thin liquids Temperature Spikes Noted: No Respiratory Status: Room air History of Recent Intubation: No Behavior/Cognition: Lethargic/Drowsy;Requires cueing Oral Cavity Assessment: Within Functional Limits Oral Care Completed by SLP: No Oral Cavity - Dentition: Adequate natural dentition Vision: Functional for self-feeding Self-Feeding Abilities: Able to feed self Patient Positioning: Upright in bed Baseline Vocal Quality: Normal;Low vocal intensity Volitional Swallow: Able to elicit    Oral/Motor/Sensory Function Overall Oral Motor/Sensory Function: Within functional limits   Ice Chips Ice chips: Not tested   Thin Liquid Thin Liquid: Within functional limits Presentation: Cup;Straw;Self Fed    Nectar Thick Nectar Thick Liquid: Not tested   Honey Thick Honey Thick Liquid: Not tested   Puree Puree: Within functional limits Presentation: Spoon   Solid   GO  Solid: Within functional limits        ,Eulogio Requena 03/12/2015,2:20 PM

## 2015-03-13 DIAGNOSIS — Z8673 Personal history of transient ischemic attack (TIA), and cerebral infarction without residual deficits: Secondary | ICD-10-CM

## 2015-03-13 DIAGNOSIS — R58 Hemorrhage, not elsewhere classified: Secondary | ICD-10-CM | POA: Insufficient documentation

## 2015-03-13 DIAGNOSIS — I639 Cerebral infarction, unspecified: Principal | ICD-10-CM

## 2015-03-13 DIAGNOSIS — I10 Essential (primary) hypertension: Secondary | ICD-10-CM

## 2015-03-13 DIAGNOSIS — R112 Nausea with vomiting, unspecified: Secondary | ICD-10-CM

## 2015-03-13 DIAGNOSIS — R233 Spontaneous ecchymoses: Secondary | ICD-10-CM

## 2015-03-13 DIAGNOSIS — D649 Anemia, unspecified: Secondary | ICD-10-CM | POA: Insufficient documentation

## 2015-03-13 DIAGNOSIS — I6350 Cerebral infarction due to unspecified occlusion or stenosis of unspecified cerebral artery: Secondary | ICD-10-CM

## 2015-03-13 DIAGNOSIS — E785 Hyperlipidemia, unspecified: Secondary | ICD-10-CM

## 2015-03-13 DIAGNOSIS — N1330 Unspecified hydronephrosis: Secondary | ICD-10-CM

## 2015-03-13 LAB — URINE CULTURE: SPECIAL REQUESTS: NORMAL

## 2015-03-13 LAB — GLUCOSE, CAPILLARY
GLUCOSE-CAPILLARY: 147 mg/dL — AB (ref 65–99)
GLUCOSE-CAPILLARY: 216 mg/dL — AB (ref 65–99)
GLUCOSE-CAPILLARY: 191 mg/dL — AB (ref 65–99)
Glucose-Capillary: 163 mg/dL — ABNORMAL HIGH (ref 65–99)
Glucose-Capillary: 220 mg/dL — ABNORMAL HIGH (ref 65–99)
Glucose-Capillary: 221 mg/dL — ABNORMAL HIGH (ref 65–99)

## 2015-03-13 LAB — BASIC METABOLIC PANEL
ANION GAP: 5 (ref 5–15)
BUN: 20 mg/dL (ref 6–20)
CHLORIDE: 105 mmol/L (ref 101–111)
CO2: 29 mmol/L (ref 22–32)
Calcium: 9.5 mg/dL (ref 8.9–10.3)
Creatinine, Ser: 0.82 mg/dL (ref 0.44–1.00)
GFR calc Af Amer: >60 mL/min (ref >60)
GLUCOSE: 152 mg/dL — AB (ref 65–99)
POTASSIUM: 3.8 mmol/L (ref 3.5–5.1)
Sodium: 139 mmol/L (ref 135–145)

## 2015-03-13 LAB — CBC
HEMATOCRIT: 32 % — AB (ref 35.0–47.0)
HEMOGLOBIN: 10.8 g/dL — AB (ref 12.0–16.0)
MCH: 28.1 pg (ref 26.0–34.0)
MCHC: 33.8 g/dL (ref 32.0–36.0)
MCV: 83.1 fL (ref 80.0–100.0)
PLATELETS: 145 10*3/uL — AB (ref 150–440)
RBC: 3.85 MIL/uL (ref 3.80–5.20)
RDW: 15.4 % — ABNORMAL HIGH (ref 11.5–14.5)
WBC: 6.6 10*3/uL (ref 3.6–11.0)

## 2015-03-13 LAB — PROTIME-INR
INR: 1.09
Prothrombin Time: 14.3 seconds (ref 11.4–15.0)

## 2015-03-13 MED ORDER — HYDROCODONE-ACETAMINOPHEN 5-325 MG PO TABS
1.0000 | ORAL_TABLET | ORAL | Status: DC | PRN
Start: 2015-03-13 — End: 2015-03-16
  Administered 2015-03-13 – 2015-03-16 (×6): 1 via ORAL
  Filled 2015-03-13 (×6): qty 1

## 2015-03-13 MED ORDER — MECLIZINE HCL 25 MG PO TABS
12.5000 mg | ORAL_TABLET | Freq: Two times a day (BID) | ORAL | Status: DC
  Administered 2015-03-13 – 2015-03-14 (×4): 12.5 mg via ORAL
  Filled 2015-03-13 (×4): qty 1

## 2015-03-13 MED ORDER — MILK AND MOLASSES ENEMA
1.0000 | Freq: Once | RECTAL | Status: AC
  Administered 2015-03-14: 250 mL via RECTAL
  Filled 2015-03-13: qty 250

## 2015-03-13 MED ORDER — CLONIDINE HCL 0.1 MG PO TABS
0.1000 mg | ORAL_TABLET | Freq: Four times a day (QID) | ORAL | Status: DC | PRN
  Administered 2015-03-13 – 2015-03-16 (×3): 0.1 mg via ORAL
  Filled 2015-03-13 (×4): qty 1

## 2015-03-13 MED ORDER — BISACODYL 10 MG RE SUPP: 10.0000 mg | Freq: Every day | RECTAL | Status: DC | PRN

## 2015-03-13 MED ORDER — LABETALOL HCL 5 MG/ML IV SOLN
10.0000 mg | INTRAVENOUS | Status: DC | PRN
  Administered 2015-03-14: 06:00:00 10 mg via INTRAVENOUS
  Filled 2015-03-13 (×4): qty 4

## 2015-03-13 NOTE — Plan of Care (Signed)
Problem: Self-Care: Goal: Ability to participate in self-care as condition permits will improve Outcome: Progressing Alert and oriented. Tylenol prn for tooth ace with relief. No new neuro deficits noted. Bleeding from iv site that was d/c a night before. MD saw, surgical consult in place. SR on monitor. Continue to monitor.

## 2015-03-13 NOTE — Progress Notes (Addendum)
[facility] NAME: Elizabeth Rivera    MR#:  [id number]  DATE OF BIRTH:  03-08-1950  SUBJECTIVE:   Patient's left arm was bleeding at the site of an IV.  REVIEW OF SYSTEMS:    Review of Systems  Constitutional: Positive for malaise/fatigue. Negative for fever and chills.  HENT: Negative for sore throat.   Eyes: Negative for blurred vision.  Respiratory: Negative for cough, hemoptysis, shortness of breath and wheezing.   Cardiovascular: Negative for chest pain, palpitations and leg swelling.  Gastrointestinal: Positive for nausea. Negative for vomiting, abdominal pain, diarrhea and blood in stool.  Genitourinary: Negative for dysuria.  Musculoskeletal: Negative for back pain.  Neurological: Positive for dizziness and weakness. Negative for tremors and headaches.  Endo/Heme/Allergies: Does not bruise/bleed easily.    Tolerating Diet: Yes      DRUG ALLERGIES:   Allergies  Allergen Reactions  . Sulfonamide Derivatives     REACTION: rash  . Sulfa Antibiotics Rash    VITALS:  Blood pressure 167/84, pulse 81, temperature 98.3 F (36.8 C), temperature source Oral, resp. rate 18, height 5' (1.524 m), weight 61.689 kg (136 lb), SpO2 96 %.  PHYSICAL EXAMINATION:   Physical Exam  Constitutional: She is oriented to person, place, and time and well-developed, well-nourished, and in no distress. No distress.  HENT:  Head: Normocephalic.  Eyes: No scleral icterus.  Neck: Normal range of motion. Neck supple. No JVD present. No tracheal deviation present.  Cardiovascular: Normal rate, regular rhythm and normal heart sounds.  Exam reveals no gallop and no friction rub.   No murmur heard. Pulmonary/Chest: Effort normal and breath sounds normal. No respiratory distress. She has no wheezes. She has no rales. She exhibits no tenderness.  Abdominal: Soft. Bowel sounds are normal. She exhibits no distension and no mass. There is no  tenderness. There is no rebound and no guarding.  Musculoskeletal: Normal range of motion. She exhibits no edema.  Neurological: She is alert and oriented to person, place, and time. She displays normal reflexes. No cranial nerve deficit. She exhibits normal muscle tone. Coordination normal.  Mild right sided weakness  Skin: Skin is warm. No rash noted. No erythema.  Psychiatric: Affect and judgment normal.      LABORATORY PANEL:   CBC  Recent Labs Lab 03/13/15 0532  WBC 6.6  HGB 10.8*  HCT 32.0*  PLT 145*   ------------------------------------------------------------------------------------------------------------------  Chemistries   Recent Labs Lab 03/11/15 1227  03/13/15 0532  NA 137  < > 139  K 3.3*  < > 3.8  CL 101  < > 105  CO2 27  < > 29  GLUCOSE 255*  < > 152*  BUN 15  < > 20  CREATININE 0.61  < > 0.82  CALCIUM 9.3  < > 9.5  AST 16  --   --   ALT 14  --   --   ALKPHOS 76  --   --   BILITOT 0.7  --   --   < > = values in this interval not displayed. ------------------------------------------------------------------------------------------------------------------  Cardiac Enzymes  Recent Labs Lab 03/11/15 1227  TROPONINI <0.03   ------------------------------------------------------------------------------------------------------------------  RADIOLOGY:  Ct Head Wo Contrast  03/11/2015  CLINICAL DATA:  The patient woke up at 4 a.m. last night with nausea and vomiting. Hypertension. Initial encounter. EXAM: CT HEAD WITHOUT CONTRAST TECHNIQUE: Contiguous axial images were obtained from the base of the skull through the vertex without  intravenous contrast. COMPARISON:  Head CT scan 06/03/2014.  Brain MRI 05/27/2014. FINDINGS: Extensive chronic microvascular ischemic change is identified. No evidence of acute abnormality including infarct, hemorrhage, mass lesion, mass effect, midline shift or abnormal extra-axial fluid collection is seen. There is no  hydrocephalus or pneumocephalus. The calvarium is intact. Imaged paranasal sinuses and mastoid air cells are clear. IMPRESSION: No acute abnormality. Extensive chronic microvascular ischemic change. Electronically Signed   By: Inge Rise M.D.   On: 03/11/2015 19:21   Mr Angiogram Neck W Wo Contrast  03/12/2015  CLINICAL DATA:  Lethargy, nausea and vomiting.  Hypertension. EXAM: MRA NECK WITHOUT AND WITH CONTRAST MRA HEAD WITHOUT CONTRAST TECHNIQUE: Multiplanar and multiecho pulse sequences of the neck were obtained without and with intravenous contrast. Angiographic images of the neck were obtained using MRA technique without and with intravenous contast.; Angiographic images of the Circle of Willis were obtained using MRA technique without intravenous contrast. CONTRAST:  6mL MULTIHANCE GADOBENATE DIMEGLUMINE 529 MG/ML IV SOLN COMPARISON:  MRI 03/11/2015 FINDINGS: MRA NECK FINDINGS Branching pattern of the brachiocephalic vessels from the arch is normal. Both common carotid arteries are widely patent to the bifurcation region. Both carotid bifurcations are widely patent without ICA stenosis or irregularity. There is mild narrowing of the proximal external carotid arteries. Both vertebral arteries are widely patent without significant proximal stenosis. Both appear normal through the cervical region. MRA HEAD FINDINGS Both internal carotid arteries are patent through the skullbase and siphon regions. The anterior and middle cerebral vessels are patent. Vessels show mild irregularity consistent with intracranial atherosclerotic change. There is an approximately 50% stenosis in the A1 segment on the right. More distal MCA branches vessels show some atherosclerotic irregularity, more extensive on the right than the left. There may even be a missing branch or 2 on the right in the temporal region. Both vertebral arteries are patent at the foramen magnum with the left being dominant. Both vertebral arteries  reach the basilar. The distal right vertebral artery shows 50% narrowing. There is no basilar stenosis. Superior cerebellar and posterior cerebral arteries are patent, with the right PCA receiving most of it supply from the anterior circulation. More distal branch vessels in the PCA territories showed narrowing and irregularity. IMPRESSION: No significant disease in the neck. Wide patency of the carotid bifurcations and of the proximal vertebral arteries. Intracranial atherosclerotic disease with narrowing and irregularity of the medium and small vessels. Diminished number of MCA branches on the right compared to the left consistent with recent infarction in the right temporal lobe. 50% stenosis of the A1 segment on the right. Fairly considerable atherosclerotic irregularity of the more distal PCA branch vessels bilaterally. Electronically Signed   By: Nelson Chimes M.D.   On: 03/12/2015 11:48   Mr Brain Wo Contrast  03/11/2015  CLINICAL DATA:  Lethargy, nausea and vomiting. Hypertensive. History of hypertension, hyperlipidemia, diabetes. EXAM: MRI HEAD WITHOUT CONTRAST TECHNIQUE: Multiplanar, multiecho pulse sequences of the brain and surrounding structures were obtained without intravenous contrast. COMPARISON:  CT head March 11, 2015 at 1913 hours FINDINGS: Fast protocol used. 1 cm focus of reduced diffusion RIGHT posterior temporal lobe with corresponding low ADC value. The ventricles and sulci are normal for patient's age. No mass lesions, mass effect. Confluent supratentorial and to lesser extent pontine white matter FLAIR T2 hyperintensities. Old RIGHT basal ganglia lacunar infarct. A few scattered peripheral punctate foci of susceptibility artifact can be seen in setting of chronic hypertension. No lobar hematoma. No abnormal extra-axial fluid  collections. No extra-axial masses though, contrast enhanced sequences would be more sensitive. Normal major intracranial vascular flow voids seen at the skull  base. Ocular globes and orbital contents are unremarkable though not tailored for evaluation. No abnormal sellar expansion. No suspicious calvarial bone marrow signal. Craniocervical junction maintained. Visualized paranasal sinuses and mastoid air cells are well-aerated. IMPRESSION: 1 cm acute RIGHT posterior temporal lobe infarct. Severe white matter changes compatible with chronic small vessel ischemic disease. Old RIGHT basal ganglia lacunar infarct. Electronically Signed   By: Elon Alas M.D.   On: 03/11/2015 22:47   Ct Abdomen Pelvis W Contrast  03/11/2015  CLINICAL DATA:  Nausea and vomiting starting this morning at 4 a.m. EXAM: CT ABDOMEN AND PELVIS WITH CONTRAST TECHNIQUE: Multidetector CT imaging of the abdomen and pelvis was performed using the standard protocol following bolus administration of intravenous contrast. CONTRAST:  127mL OMNIPAQUE IOHEXOL 300 MG/ML  SOLN COMPARISON:  05/06/2009 and lumbar spine 12/19/2011 FINDINGS: The lung bases are unremarkable. Sagittal images of the spine shows mild degenerative changes thoracolumbar spine. There is diffuse osteopenia. There is mild compression deformity upper endplate of L1 vertebral body. Schmorl's node deformity noted upper endplate of 624THL vertebral body. The patient is status post cholecystectomy. No focal hepatic mass. Enhanced pancreas, spleen and right adrenal are stable. Stable fat containing lesion in left adrenal benign in nature measures 1.5 cm. Atherosclerotic calcifications of abdominal aorta and iliac arteries. No aortic aneurysm. There is mild right hydronephrosis and proximal right hydroureter. Mild dilatation of the left renal pelvis without frank hydronephrosis. No calcified ureteral calculi are noted bilaterally. There is a cyst in midpole posterior aspect of the right kidney measures 1.3 cm. Delayed renal images shows bilateral renal symmetrical excretion. Bilateral distal ureter is small caliber. Mild dilatation of mid right  ureter. A distal right ureteral stricture cannot be excluded. Correlation with urology exam is recommended. No definite evidence of ureteral mass. Significant distended urinary bladder. The urinary bladder measures at least 19 cm cranial caudally by 8.8 cm AP diameter. There is no small bowel obstruction. No ascites or free air. No adenopathy. Normal appendix. No pericecal inflammation. Again noted a small supraumbilical hernia containing fat without evidence of acute complication. There is some loculated fluid umbilical region superficial measures 1.8 cm. Cellulitis or small umbilical abscess cannot be excluded. Clinical correlation is necessary. There is moderate stool noted in distal sigmoid colon and rectum. No colitis or diverticulitis. No distal colonic obstruction. Few diverticula are noted in descending colon. The patient is status post hysterectomy. IMPRESSION: 1. There is mild right hydronephrosis and proximal right hydroureter. No calcified ureteral calculi are noted bilaterally. Delayed renal images shows bilateral renal symmetrical excretion. Correlation with urology exam is recommended to exclude subtle distal right ureteral stricture. 2. Significant distended urinary bladder. Urinary bladder measures at least 19 cm cranial caudally by 9 cm AP diameter. 3. No small bowel obstruction. 4. Status postcholecystectomy. 5. Stable fat containing lesion in left adrenal gland benign in nature. 6. There is small amount of loculated fluid in the umbilical region please see axial image 46 measures about 1.8 cm. Cellulitis or small abscess cannot be excluded. Clinical correlation is necessary. 7. Stable small supraumbilical hernia containing fat without evidence of acute complication. 8. Status post hysterectomy. 9. Atherosclerotic calcifications of abdominal aorta and iliac arteries. Electronically Signed   By: Lahoma Crocker M.D.   On: 03/11/2015 16:58   Dg Abd Acute W/chest  03/11/2015  CLINICAL DATA:  Left upper  quadrant  pain, constipation, nausea starting this morning EXAM: DG ABDOMEN ACUTE W/ 1V CHEST COMPARISON:  01/27/2013 FINDINGS: Cardiomediastinal silhouette is stable. No acute infiltrate or pleural effusion. No pulmonary edema. Mild dextroscoliosis lower thoracic spine. Mild levoscoliosis lumbar spine. There is normal small bowel gas pattern. Postcholecystectomy surgical clips are noted. Moderate stool noted in distal sigmoid colon and rectum. No free abdominal air. IMPRESSION: Negative abdominal radiographs. No acute cardiopulmonary disease. No free abdominal air. Postcholecystectomy surgical clips are noted. Thoracolumbar scoliosis. Moderate stool are noted in distal sigmoid colon and rectum. Electronically Signed   By: Lahoma Crocker M.D.   On: 03/11/2015 13:55   Mr Lovenia Kim  03/12/2015  CLINICAL DATA:  Lethargy, nausea and vomiting.  Hypertension. EXAM: MRA NECK WITHOUT AND WITH CONTRAST MRA HEAD WITHOUT CONTRAST TECHNIQUE: Multiplanar and multiecho pulse sequences of the neck were obtained without and with intravenous contrast. Angiographic images of the neck were obtained using MRA technique without and with intravenous contast.; Angiographic images of the Circle of Willis were obtained using MRA technique without intravenous contrast. CONTRAST:  17mL MULTIHANCE GADOBENATE DIMEGLUMINE 529 MG/ML IV SOLN COMPARISON:  MRI 03/11/2015 FINDINGS: MRA NECK FINDINGS Branching pattern of the brachiocephalic vessels from the arch is normal. Both common carotid arteries are widely patent to the bifurcation region. Both carotid bifurcations are widely patent without ICA stenosis or irregularity. There is mild narrowing of the proximal external carotid arteries. Both vertebral arteries are widely patent without significant proximal stenosis. Both appear normal through the cervical region. MRA HEAD FINDINGS Both internal carotid arteries are patent through the skullbase and siphon regions. The anterior and middle cerebral  vessels are patent. Vessels show mild irregularity consistent with intracranial atherosclerotic change. There is an approximately 50% stenosis in the A1 segment on the right. More distal MCA branches vessels show some atherosclerotic irregularity, more extensive on the right than the left. There may even be a missing branch or 2 on the right in the temporal region. Both vertebral arteries are patent at the foramen magnum with the left being dominant. Both vertebral arteries reach the basilar. The distal right vertebral artery shows 50% narrowing. There is no basilar stenosis. Superior cerebellar and posterior cerebral arteries are patent, with the right PCA receiving most of it supply from the anterior circulation. More distal branch vessels in the PCA territories showed narrowing and irregularity. IMPRESSION: No significant disease in the neck. Wide patency of the carotid bifurcations and of the proximal vertebral arteries. Intracranial atherosclerotic disease with narrowing and irregularity of the medium and small vessels. Diminished number of MCA branches on the right compared to the left consistent with recent infarction in the right temporal lobe. 50% stenosis of the A1 segment on the right. Fairly considerable atherosclerotic irregularity of the more distal PCA branch vessels bilaterally. Electronically Signed   By: Nelson Chimes M.D.   On: 03/12/2015 11:48     ASSESSMENT AND PLAN:   65 year old female with a history of stroke presented with intractable nausea and vomiting and significantly elevated blood pressure and found to have 1 cm acute RIGHT posterior temporal lobe infarct.  1. 1 cm acute RIGHT posterior temporal lobe infarct: MRA shows no significant carotid stenosis or aneurysm. Echocardiogram shows normal ejection fraction without valvular abnormality. Occupational therapy is recommending skilled nursing facility. PT consult is pending. Continue Plavix and statin. LDL 160 not at goAl.  2.  Intractable nausea and vomiting: This is due to CVA and appears positional in nature. Try meclizine twice a day continue  supportive care  3. Essential hypertension: Due to the acute nature of the stroke will allow permissive hypertension.   4. Type 2 diabetes without medication: Continue sliding scale insulin and Lantus.  5. Hypothyroid: Continue Synthroid  6. Depression and anxiety: Continue Effexor and Xanax.  7. Bleeding from IV site: I am wondering if this was an arterial stick the cause continues bleeding. It appears that bleeding has stopped.    Management plans discussed with the patient and she is in agreement.  CODE STATUS: FULL  TOTAL TIME TAKING CARE OF THIS PATIENT: 30 minutes.     POSSIBLE D/C tomorrow, DEPENDING ON CLINICAL CONDITION.   Elizabeth Rivera M.D on 03/13/2015 at 10:14 AM  Between 7am to 6pm - Pager - [phone] After 6pm go to [email or web address] - password [email or web address] [facility]  Office  [phone]  CC: Primary care physician; Elsie Stain, MD  Note: This dictation was prepared with Dragon dictation along with smaller phrase technology. Any transcriptional errors that result from this process are unintentional.

## 2015-03-13 NOTE — Plan of Care (Signed)
Problem: Physical Regulation: Goal: Ability to maintain clinical measurements within normal limits will improve Outcome: Progressing Surgery consult done. No further bleeding noted from L forearm. Dressing dry and intact. Plavix initiated.

## 2015-03-13 NOTE — Progress Notes (Signed)
SLP Cancellation Note  Patient Details Name: ANNAMAE QUAIN MRN: [id number] DOB: [birth year]   Cancelled treatment:       Reason Eval/Treat Not Completed: Medical issues which prohibited therapy Reviewed chart and spoke w/pt's family. Pt is currently somnolent and has been having bleeding issues and iv site. Pt's family reports that pt is currently tolerating her diet well. Will defer tx today d/t pt's lethargy. Will f/u in 1-3 days.    Menan,Maaran 03/13/2015, 1:53 PM

## 2015-03-13 NOTE — Progress Notes (Signed)
Elizabeth Rivera  Date of admission:  03/11/2015  Inpatient day:  03/13/2015  Consulting physician: Bettey Costa, MD  Reason for Consultation:  Patient with bleeding arm and petechiae ? Plavix vs HIT?  Chief Complaint: Elizabeth Rivera is a 65 y.o. female with a history of CVA admitted with admitted with intractable nausea nd vomiting and subsequent imaging revealing an occipital infarct.  HPI: The patient notes a history of CVA in 05/2014. She states that following her to a slower" and unable to do multitask. She also had memory issues. Following her stroke she is lost 35 pounds which 20 have been loss in the past few months. He notes a poor appetite.  She presented with intractable nausea, vomiting, and hypertension  (systolic in the 416S). Abdominal and pelvic CT scan with contrast revealed right hydronephrosis and proximal right hydroureter and significant distended urinary bladder.  Chest x-ray was negative.  Head CT without contrast on 03/11/2015 revealed no acute abnormality and extensive chronic microvascular ischemic change.  Head MRI on 03/11/2015 revealed a 1 cm right posterior temporal lobe infarct.  Prior to admission she notes taking a baby aspirin and extra strength Excedrin.  She was started on Lovenox 40 mg SQ on 03/11/2015 and Plavix on 03/12/2015 at 1 pm.  She has recently noted petechiae on the dorsum of her right hand and a few petechiae on her left hand.  She has had some bleeding from the IV site from her right arm.  She has had no other bruising or bleeding (nose bleeds, gum bleeds, blood in the urine or stool).  Labs on 06/03/2014 revealed a hematocrit 31.8, hemoglobin 10.4, platelets 135,000, white count 5200. On admission, hematocrit was 38, hemoglobin 12.5, platelets 148,000, white count 7000 with an ANC of 5900. Differential was normal.   CBC on 03/12/2015 revealed a hematocrit 35.4, hemoglobin 11.9 flavus 180,000. CBC today includes a hematocrit 32,  hemoglobin 10.8, MCV 83.1, platelets 145,000, white count 6600.  Creatinine is 0.82.  She notes a poor diet.  She typically will eat part of a grilled cheese sandwich.  She does not eat much meat.  She had a colonoscopy < 5 years ago.  She notes chronic anemia.  Past Medical History  Diagnosis Date  . Thyroid disease     hypothyroid  . Anxiety   . Hyperlipidemia   . Hypertension   . Other chest pain     chest discomfort  . Obesity   . Neuropathy ([place])   . Other esophagitis     erosive esophagitis  . Other specified gastritis without mention of hemorrhage     erosvie gastritis  . Pain in limb     Right leg pain  . Other specified disorders of adrenal glands 07/2007    adrenal mass via of CT 1.4cm left Adrenal no change(Dr.Cope)   . Other abnormal blood chemistry   . Diverticulosis of colon (without mention of hemorrhage) 05/20/2009    Internal hemms (Dr. Vira Agar)  . Irritable bowel syndrome     history of  . Insomnia, unspecified   . Nervous breakdown 08/2002    [facility]  . GERD (gastroesophageal reflux disease)     reflux HH 09/01/2002//egd reflux Esoph. HH Gastritis nonbleeding erosive gastropathy Duodentitis 08/15/2006  . Hyperglycemia   . Candidal vaginitis   . Forearm fracture     2013, left  . Anemia   . Anemia of other chronic disease 09/01/2013    Past Surgical History  Procedure Laterality  Date  . Cholecystectomy  03/2004  . Abdominal hysterectomy  1995    Part Hyst BSO DUB ovarian cyst B9  . Vaginal delivery  1977  . Breast reduction surgery  1984  . Cystoscopy  01/08/2008    Former site of inflammation healed (Dr. Jacqlyn Larsen)  . Nsvd x1  1977  . Esophagogastroduodenoscopy  1. 09/01/02  2. 08/15/06    1. Reflux, HH  2. Reflux esoph HH gaastritis nonbleeding erosive gastropathy duodenitis  . Colonoscopy  1. 08/15/06  2. 05/20/09    Int Hemms  2. Divertics Int Hemms (Dr. Vira Agar)  . Ct abd w & pelvis wo cm  6/09    CT Scan abd stable adrenal adenoma 1.4cm left  adrenal no change (Dr. Jacqlyn Larsen)    Family History  Problem Relation Age of Onset  . Hypertension Mother   . Hyperlipidemia Mother   . Stroke Mother     58  . Vision loss Father     vision problems  . Cancer Father     Colon   . Hyperlipidemia Sister   . Hypertension Sister   . Vision loss Sister     Social History:  reports that she has never smoked. She has never used smokeless tobacco. She reports that she does not drink alcohol or use illicit drugs.  She is accompanied by her daughter, Elizabeth Rivera.  Allergies:  Allergies  Allergen Reactions  . Sulfonamide Derivatives     REACTION: rash  . Sulfa Antibiotics Rash    Medications Prior to Admission  Medication Sig Dispense Refill  . ALPRAZolam (XANAX) 0.25 MG tablet Take 1 tablet (0.25 mg total) by mouth 2 (two) times daily as needed. (Patient taking differently: Take 0.25 mg by mouth 2 (two) times daily. ) 180 tablet 1  . aspirin EC 81 MG tablet Take 81 mg by mouth daily.    . Blood Glucose Monitoring Suppl (ONE TOUCH ULTRA 2) W/DEVICE KIT Use to test blood sugar twice daily and as directed.  Diagnosis:  E11.40  Insulin-dependent. 1 each 0  . cholecalciferol (VITAMIN D) 1000 units tablet Take 1,000 Units by mouth daily.    . ferrous sulfate 325 (65 FE) MG tablet Take 1 tablet (325 mg total) by mouth daily with breakfast.    . glucose blood (ONE TOUCH ULTRA TEST) test strip Use as instructed to test blood sugar twice daily and as directed.  Diagnosis:  E11.40  Insulin dependent. 200 each 3  . Insulin Glargine (LANTUS SOLOSTAR) 100 UNIT/ML Solostar Pen Inject 9 Units into the skin daily at 10 pm. If sugar 100-130, no change.  Add 1 unit if AM sugar >130. Dec 1 unit if <100. (Patient taking differently: Inject 9 Units into the skin at bedtime. If sugar 100-130, no change.  Add 1 unit if AM sugar >130. Dec 1 unit if <100.) 15 mL   . Lancets (ONETOUCH ULTRASOFT) lancets Use as instructed to test blood sugar twice daily and as directed.   Diagnosis:  E11.40   Insulin-dependent. 200 each 3  . levothyroxine (SYNTHROID, LEVOTHROID) 125 MCG tablet Take 1 tablet (125 mcg total) by mouth daily before breakfast. 90 tablet 3  . lisinopril (PRINIVIL,ZESTRIL) 5 MG tablet Take 1 tablet (5 mg total) by mouth daily. 90 tablet 3  . metFORMIN (GLUCOPHAGE) 1000 MG tablet Take 0.5 tablets (500 mg total) by mouth 2 (two) times daily with a meal. 90 tablet 3  . metoprolol succinate (TOPROL XL) 100 MG 24 hr tablet Take  1 tablet (100 mg total) by mouth daily. 30 tablet 3  . omeprazole (PRILOSEC) 40 MG capsule Take 1 capsule (40 mg total) by mouth 2 (two) times daily. 180 capsule 3  . pregabalin (LYRICA) 75 MG capsule 2 tabs in AM and 1 in PM. (Patient taking differently: Take 75-150 mg by mouth See admin instructions. 150 mg every morning and 75 mg every evening) 270 capsule 1  . ranitidine (ZANTAC) 300 MG tablet Take 1 tablet (300 mg total) by mouth daily. 90 tablet 3  . venlafaxine XR (EFFEXOR-XR) 150 MG 24 hr capsule Take 1 capsule (150 mg total) by mouth 2 (two) times daily. 180 capsule 3  . vitamin B-12 (CYANOCOBALAMIN) 1000 MCG tablet Take 1,000 mcg by mouth 2 (two) times daily.        Review of Systems: GENERAL:  Fatigue.  "Slow".  No fevers or sweats.  Weight loss after 1st CVA in 2016 (approximately 35 pounds). PERFORMANCE STATUS (ECOG):  2-3 HEENT:  No visual changes, runny nose, sore throat, mouth sores or tenderness. Lungs: No shortness of breath or cough.  No hemoptysis. Cardiac:  No chest pain, palpitations, orthopnea, or PND. GI:  Poor appetite.  Black stool at times.  No nausea, vomiting, diarrhea, constipation, or hematochezia.  Colonoscopy < 5 years ago.   GU:  No urgency, frequency, dysuria, or hematuria. Musculoskeletal:  No back pain.  No joint pain.  No muscle tenderness. Extremities:  No pain or swelling. Skin:  No rashes or skin changes. Neuro:  Slow since 1st CVA.  Unable to multi-task.  No headache, numbness or weakness,  balance or coordination issues. Endocrine:  Diabetes.  Thyroid disease on Synthroid.  No hot flashes or night sweats. Psych:  No mood changes, depression or anxiety. Pain:  No focal pain. Review of systems:  All other systems reviewed and found to be negative.  Physical Exam:  Blood pressure 169/95, pulse 104, temperature 98.5 F (36.9 C), temperature source Oral, resp. rate 18, height 5' (1.524 m), weight 136 lb (61.689 kg), SpO2 98 %.  GENERAL:  Pale woman sitting in bed being encouraged to eat by her daughter on the medical unit in no acute distress. MENTAL STATUS:  Alert and oriented to person, place and time. HEAD:  Pearline Cables hair.  Normocephalic, atraumatic, face symmetric, no Cushingoid features. EYES:  Blue eyes.  Pupils equal round and reactive to light and accomodation.  No conjunctivitis or scleral icterus. ENT:  Oropharynx clear without lesion.  Tongue normal. Mucous membranes moist.  RESPIRATORY:  Clear to auscultation without rales, wheezes or rhonchi. CARDIOVASCULAR:  Regular rate and rhythm without murmur, rub or gallop. ABDOMEN:  Soft, non-tender, with active bowel sounds, and no hepatosplenomegaly.  No masses. SKIN:  Dorsum of right hand with petechiae confluent (near thumb).  Few petechiae left dorsum of hand.  No other petechiae. No rashes, ulcers or lesions. EXTREMITIES: Left forearm wrapped.  No edema, no skin discoloration or tenderness.  No palpable cords. LYMPH NODES: No palpable cervical, supraclavicular, axillary or inguinal adenopathy  NEUROLOGICAL: Slow.  Moves all 4 extremities. PSYCH:  Appropriate.  Results for orders placed or performed during the hospital encounter of 03/11/15 (from the past 48 hour(s))  Glucose, capillary     Status: Abnormal   Collection Time: 03/11/15 11:44 PM  Result Value Ref Range   Glucose-Capillary 215 (H) 65 - 99 mg/dL   Comment 1 Notify RN   Lipid panel     Status: Abnormal   Collection Time: 03/12/15  5:18 AM  Result Value Ref  Range   Cholesterol 250 (H) 0 - 200 mg/dL   Triglycerides 129 <150 mg/dL   HDL 64 >40 mg/dL   Total CHOL/HDL Ratio 3.9 RATIO   VLDL 26 0 - 40 mg/dL   LDL Cholesterol 160 (H) 0 - 99 mg/dL    Comment:        Total Cholesterol/HDL:CHD Risk Coronary Heart Disease Risk Table                     Men   Women  1/2 Average Risk   3.4   3.3  Average Risk       5.0   4.4  2 X Average Risk   9.6   7.1  3 X Average Risk  23.4   11.0        Use the calculated Patient Ratio above and the CHD Risk Table to determine the patient's CHD Risk.        ATP III CLASSIFICATION (LDL):  <100     mg/dL   Optimal  100-129  mg/dL   Near or Above                    Optimal  130-159  mg/dL   Borderline  160-189  mg/dL   High  >190     mg/dL   Very High   Hemoglobin A1c     Status: Abnormal   Collection Time: 03/12/15  5:18 AM  Result Value Ref Range   Hgb A1c MFr Bld 7.4 (H) 4.0 - 6.0 %  Basic metabolic panel     Status: Abnormal   Collection Time: 03/12/15  5:18 AM  Result Value Ref Range   Sodium 137 135 - 145 mmol/L   Potassium 3.4 (L) 3.5 - 5.1 mmol/L   Chloride 101 101 - 111 mmol/L   CO2 25 22 - 32 mmol/L   Glucose, Bld 182 (H) 65 - 99 mg/dL   BUN 18 6 - 20 mg/dL   Creatinine, Ser 0.78 0.44 - 1.00 mg/dL   Calcium 9.2 8.9 - 10.3 mg/dL   GFR calc non Af Amer >60 >60 mL/min   GFR calc Af Amer >60 >60 mL/min    Comment: (NOTE) The eGFR has been calculated using the CKD EPI equation. This calculation has not been validated in all clinical situations. eGFR's persistently <60 mL/min signify possible Chronic Kidney Disease.    Anion gap 11 5 - 15  CBC     Status: Abnormal   Collection Time: 03/12/15  5:18 AM  Result Value Ref Range   WBC 8.4 3.6 - 11.0 K/uL   RBC 4.25 3.80 - 5.20 MIL/uL   Hemoglobin 11.9 (L) 12.0 - 16.0 g/dL   HCT 35.4 35.0 - 47.0 %   MCV 83.3 80.0 - 100.0 fL   MCH 28.1 26.0 - 34.0 pg   MCHC 33.7 32.0 - 36.0 g/dL   RDW 14.9 (H) 11.5 - 14.5 %   Platelets 180 150 - 440  K/uL  Glucose, capillary     Status: Abnormal   Collection Time: 03/12/15  6:02 AM  Result Value Ref Range   Glucose-Capillary 164 (H) 65 - 99 mg/dL   Comment 1 Notify RN   Glucose, capillary     Status: Abnormal   Collection Time: 03/12/15  1:26 PM  Result Value Ref Range   Glucose-Capillary 161 (H) 65 - 99 mg/dL  Glucose, capillary  Status: Abnormal   Collection Time: 03/12/15  5:07 PM  Result Value Ref Range   Glucose-Capillary 132 (H) 65 - 99 mg/dL   Comment 1 Notify RN   Glucose, capillary     Status: Abnormal   Collection Time: 03/12/15  9:41 PM  Result Value Ref Range   Glucose-Capillary 220 (H) 65 - 99 mg/dL  Glucose, capillary     Status: Abnormal   Collection Time: 03/12/15 11:50 PM  Result Value Ref Range   Glucose-Capillary 220 (H) 65 - 99 mg/dL  CBC     Status: Abnormal   Collection Time: 03/13/15  5:32 AM  Result Value Ref Range   WBC 6.6 3.6 - 11.0 K/uL   RBC 3.85 3.80 - 5.20 MIL/uL   Hemoglobin 10.8 (L) 12.0 - 16.0 g/dL   HCT 32.0 (L) 35.0 - 47.0 %   MCV 83.1 80.0 - 100.0 fL   MCH 28.1 26.0 - 34.0 pg   MCHC 33.8 32.0 - 36.0 g/dL   RDW 15.4 (H) 11.5 - 14.5 %   Platelets 145 (L) 150 - 440 K/uL  Basic metabolic panel     Status: Abnormal   Collection Time: 03/13/15  5:32 AM  Result Value Ref Range   Sodium 139 135 - 145 mmol/L   Potassium 3.8 3.5 - 5.1 mmol/L   Chloride 105 101 - 111 mmol/L   CO2 29 22 - 32 mmol/L   Glucose, Bld 152 (H) 65 - 99 mg/dL   BUN 20 6 - 20 mg/dL   Creatinine, Ser 0.82 0.44 - 1.00 mg/dL   Calcium 9.5 8.9 - 10.3 mg/dL   GFR calc non Af Amer >60 >60 mL/min   GFR calc Af Amer >60 >60 mL/min    Comment: (NOTE) The eGFR has been calculated using the CKD EPI equation. This calculation has not been validated in all clinical situations. eGFR's persistently <60 mL/min signify possible Chronic Kidney Disease.    Anion gap 5 5 - 15  Protime-INR     Status: None   Collection Time: 03/13/15  5:32 AM  Result Value Ref Range    Prothrombin Time 14.3 11.4 - 15.0 seconds   INR 1.09   Glucose, capillary     Status: Abnormal   Collection Time: 03/13/15  5:39 AM  Result Value Ref Range   Glucose-Capillary 147 (H) 65 - 99 mg/dL  Glucose, capillary     Status: Abnormal   Collection Time: 03/13/15 11:34 AM  Result Value Ref Range   Glucose-Capillary 216 (H) 65 - 99 mg/dL   Comment 1 Notify RN   Glucose, capillary     Status: Abnormal   Collection Time: 03/13/15  1:22 PM  Result Value Ref Range   Glucose-Capillary 221 (H) 65 - 99 mg/dL   Ct Head Wo Contrast  03/11/2015  CLINICAL DATA:  The patient woke up at 4 a.m. last night with nausea and vomiting. Hypertension. Initial encounter. EXAM: CT HEAD WITHOUT CONTRAST TECHNIQUE: Contiguous axial images were obtained from the base of the skull through the vertex without intravenous contrast. COMPARISON:  Head CT scan 06/03/2014.  Brain MRI 05/27/2014. FINDINGS: Extensive chronic microvascular ischemic change is identified. No evidence of acute abnormality including infarct, hemorrhage, mass lesion, mass effect, midline shift or abnormal extra-axial fluid collection is seen. There is no hydrocephalus or pneumocephalus. The calvarium is intact. Imaged paranasal sinuses and mastoid air cells are clear. IMPRESSION: No acute abnormality. Extensive chronic microvascular ischemic change. Electronically Signed   By: Marcello Moores  Dalessio M.D.   On: 03/11/2015 19:21   Mr Angiogram Neck W Wo Contrast  03/12/2015  CLINICAL DATA:  Lethargy, nausea and vomiting.  Hypertension. EXAM: MRA NECK WITHOUT AND WITH CONTRAST MRA HEAD WITHOUT CONTRAST TECHNIQUE: Multiplanar and multiecho pulse sequences of the neck were obtained without and with intravenous contrast. Angiographic images of the neck were obtained using MRA technique without and with intravenous contast.; Angiographic images of the Circle of Willis were obtained using MRA technique without intravenous contrast. CONTRAST:  81m MULTIHANCE  GADOBENATE DIMEGLUMINE 529 MG/ML IV SOLN COMPARISON:  MRI 03/11/2015 FINDINGS: MRA NECK FINDINGS Branching pattern of the brachiocephalic vessels from the arch is normal. Both common carotid arteries are widely patent to the bifurcation region. Both carotid bifurcations are widely patent without ICA stenosis or irregularity. There is mild narrowing of the proximal external carotid arteries. Both vertebral arteries are widely patent without significant proximal stenosis. Both appear normal through the cervical region. MRA HEAD FINDINGS Both internal carotid arteries are patent through the skullbase and siphon regions. The anterior and middle cerebral vessels are patent. Vessels show mild irregularity consistent with intracranial atherosclerotic change. There is an approximately 50% stenosis in the A1 segment on the right. More distal MCA branches vessels show some atherosclerotic irregularity, more extensive on the right than the left. There may even be a missing branch or 2 on the right in the temporal region. Both vertebral arteries are patent at the foramen magnum with the left being dominant. Both vertebral arteries reach the basilar. The distal right vertebral artery shows 50% narrowing. There is no basilar stenosis. Superior cerebellar and posterior cerebral arteries are patent, with the right PCA receiving most of it supply from the anterior circulation. More distal branch vessels in the PCA territories showed narrowing and irregularity. IMPRESSION: No significant disease in the neck. Wide patency of the carotid bifurcations and of the proximal vertebral arteries. Intracranial atherosclerotic disease with narrowing and irregularity of the medium and small vessels. Diminished number of MCA branches on the right compared to the left consistent with recent infarction in the right temporal lobe. 50% stenosis of the A1 segment on the right. Fairly considerable atherosclerotic irregularity of the more distal PCA  branch vessels bilaterally. Electronically Signed   By: MNelson ChimesM.D.   On: 03/12/2015 11:48   Mr Brain Wo Contrast  03/11/2015  CLINICAL DATA:  Lethargy, nausea and vomiting. Hypertensive. History of hypertension, hyperlipidemia, diabetes. EXAM: MRI HEAD WITHOUT CONTRAST TECHNIQUE: Multiplanar, multiecho pulse sequences of the brain and surrounding structures were obtained without intravenous contrast. COMPARISON:  CT head March 11, 2015 at 1913 hours FINDINGS: Fast protocol used. 1 cm focus of reduced diffusion RIGHT posterior temporal lobe with corresponding low ADC value. The ventricles and sulci are normal for patient's age. No mass lesions, mass effect. Confluent supratentorial and to lesser extent pontine white matter FLAIR T2 hyperintensities. Old RIGHT basal ganglia lacunar infarct. A few scattered peripheral punctate foci of susceptibility artifact can be seen in setting of chronic hypertension. No lobar hematoma. No abnormal extra-axial fluid collections. No extra-axial masses though, contrast enhanced sequences would be more sensitive. Normal major intracranial vascular flow voids seen at the skull base. Ocular globes and orbital contents are unremarkable though not tailored for evaluation. No abnormal sellar expansion. No suspicious calvarial bone marrow signal. Craniocervical junction maintained. Visualized paranasal sinuses and mastoid air cells are well-aerated. IMPRESSION: 1 cm acute RIGHT posterior temporal lobe infarct. Severe white matter changes compatible with chronic small vessel ischemic  disease. Old RIGHT basal ganglia lacunar infarct. Electronically Signed   By: Elon Alas M.D.   On: 03/11/2015 22:47   Ct Abdomen Pelvis W Contrast  03/11/2015  CLINICAL DATA:  Nausea and vomiting starting this morning at 4 a.m. EXAM: CT ABDOMEN AND PELVIS WITH CONTRAST TECHNIQUE: Multidetector CT imaging of the abdomen and pelvis was performed using the standard protocol following bolus  administration of intravenous contrast. CONTRAST:  140m OMNIPAQUE IOHEXOL 300 MG/ML  SOLN COMPARISON:  05/06/2009 and lumbar spine 12/19/2011 FINDINGS: The lung bases are unremarkable. Sagittal images of the spine shows mild degenerative changes thoracolumbar spine. There is diffuse osteopenia. There is mild compression deformity upper endplate of L1 vertebral body. Schmorl's node deformity noted upper endplate of TF41vertebral body. The patient is status post cholecystectomy. No focal hepatic mass. Enhanced pancreas, spleen and right adrenal are stable. Stable fat containing lesion in left adrenal benign in nature measures 1.5 cm. Atherosclerotic calcifications of abdominal aorta and iliac arteries. No aortic aneurysm. There is mild right hydronephrosis and proximal right hydroureter. Mild dilatation of the left renal pelvis without frank hydronephrosis. No calcified ureteral calculi are noted bilaterally. There is a cyst in midpole posterior aspect of the right kidney measures 1.3 cm. Delayed renal images shows bilateral renal symmetrical excretion. Bilateral distal ureter is small caliber. Mild dilatation of mid right ureter. A distal right ureteral stricture cannot be excluded. Correlation with urology exam is recommended. No definite evidence of ureteral mass. Significant distended urinary bladder. The urinary bladder measures at least 19 cm cranial caudally by 8.8 cm AP diameter. There is no small bowel obstruction. No ascites or free air. No adenopathy. Normal appendix. No pericecal inflammation. Again noted a small supraumbilical hernia containing fat without evidence of acute complication. There is some loculated fluid umbilical region superficial measures 1.8 cm. Cellulitis or small umbilical abscess cannot be excluded. Clinical correlation is necessary. There is moderate stool noted in distal sigmoid colon and rectum. No colitis or diverticulitis. No distal colonic obstruction. Few diverticula are noted  in descending colon. The patient is status post hysterectomy. IMPRESSION: 1. There is mild right hydronephrosis and proximal right hydroureter. No calcified ureteral calculi are noted bilaterally. Delayed renal images shows bilateral renal symmetrical excretion. Correlation with urology exam is recommended to exclude subtle distal right ureteral stricture. 2. Significant distended urinary bladder. Urinary bladder measures at least 19 cm cranial caudally by 9 cm AP diameter. 3. No small bowel obstruction. 4. Status postcholecystectomy. 5. Stable fat containing lesion in left adrenal gland benign in nature. 6. There is small amount of loculated fluid in the umbilical region please see axial image 46 measures about 1.8 cm. Cellulitis or small abscess cannot be excluded. Clinical correlation is necessary. 7. Stable small supraumbilical hernia containing fat without evidence of acute complication. 8. Status post hysterectomy. 9. Atherosclerotic calcifications of abdominal aorta and iliac arteries. Electronically Signed   By: LLahoma CrockerM.D.   On: 03/11/2015 16:58   Mr MLovenia Kim 03/12/2015  CLINICAL DATA:  Lethargy, nausea and vomiting.  Hypertension. EXAM: MRA NECK WITHOUT AND WITH CONTRAST MRA HEAD WITHOUT CONTRAST TECHNIQUE: Multiplanar and multiecho pulse sequences of the neck were obtained without and with intravenous contrast. Angiographic images of the neck were obtained using MRA technique without and with intravenous contast.; Angiographic images of the Circle of Willis were obtained using MRA technique without intravenous contrast. CONTRAST:  158mMULTIHANCE GADOBENATE DIMEGLUMINE 529 MG/ML IV SOLN COMPARISON:  MRI 03/11/2015 FINDINGS: MRA NECK FINDINGS  Branching pattern of the brachiocephalic vessels from the arch is normal. Both common carotid arteries are widely patent to the bifurcation region. Both carotid bifurcations are widely patent without ICA stenosis or irregularity. There is mild narrowing of the  proximal external carotid arteries. Both vertebral arteries are widely patent without significant proximal stenosis. Both appear normal through the cervical region. MRA HEAD FINDINGS Both internal carotid arteries are patent through the skullbase and siphon regions. The anterior and middle cerebral vessels are patent. Vessels show mild irregularity consistent with intracranial atherosclerotic change. There is an approximately 50% stenosis in the A1 segment on the right. More distal MCA branches vessels show some atherosclerotic irregularity, more extensive on the right than the left. There may even be a missing branch or 2 on the right in the temporal region. Both vertebral arteries are patent at the foramen magnum with the left being dominant. Both vertebral arteries reach the basilar. The distal right vertebral artery shows 50% narrowing. There is no basilar stenosis. Superior cerebellar and posterior cerebral arteries are patent, with the right PCA receiving most of it supply from the anterior circulation. More distal branch vessels in the PCA territories showed narrowing and irregularity. IMPRESSION: No significant disease in the neck. Wide patency of the carotid bifurcations and of the proximal vertebral arteries. Intracranial atherosclerotic disease with narrowing and irregularity of the medium and small vessels. Diminished number of MCA branches on the right compared to the left consistent with recent infarction in the right temporal lobe. 50% stenosis of the A1 segment on the right. Fairly considerable atherosclerotic irregularity of the more distal PCA branch vessels bilaterally. Electronically Signed   By: Nelson Chimes M.D.   On: 03/12/2015 11:48    Assessment:  The patient is a 65 y.o. woman with a history of CVA in 05/2014.  She presented with intractable nausea and vomiting, hypertension, and a new right posterior temporal CVA.  Prior to admission, she was taking a baby aspirin and Excedrin for a  headache.  She has recently started Lovenox and Plavix.  She has some petechiae on the dorsum of her right hand and a few on her left hand possibly related to BP cuff and antiplatelet therapy.  Platelet count is at baseline with no evidence of HIT.  She has chronic anemia.  Diet is poor.  Her daughter notes intermittent black stools.  She had a colonoscopy < 5 years ago.  She has lost 35 pounds since her first CVA in 05/2014.  Plan:   1.  Follow CBC with diff daily. 2.  Anemia work-up:  ferritin, iron studies, retic, B12, folate. 3.  Given arterial thrombosis check: lupus anticoagulant, anticardiolipin antibodies. 4.  Guaiac all stools. 5.  Nutrition consult.  Thank you for allowing me to participate in Elizabeth Rivera 's care.  I will follow her closely with you while hospitalized and after discharge in the outpatient department.  Lequita Asal, MD  03/13/2015, 4:34 PM

## 2015-03-13 NOTE — Progress Notes (Addendum)
Called by RN taking care of the patient. Patient has blood oozing from the IV site on the left fore arm. Pressure bandage applied, still the oozing persists.On plavix for cva and  lovenox for dvt prophylaxis. CBC checked hemoglobin 10.8 Platelet count 135000 Applied tight pressure bandage.Surgery consult. Monitor patient.

## 2015-03-13 NOTE — Progress Notes (Signed)
PT Cancellation Note  Patient Details Name: Elizabeth Rivera MRN: [id number] DOB: 06-13-1950   Cancelled Treatment:    Reason Eval/Treat Not Completed: Medical issues which prohibited therapy. PT discussed case with RN, patient has been actively bleeding from IV site and has surgical consult pending. Patient inappropriate for mobility evaluation currently, PT will continue to attempt mobility evaluation as patient is appropriate and available.   Kerman Passey, PT, DPT    03/13/2015, 11:17 AM

## 2015-03-13 NOTE — H&P (Signed)
Elizabeth Rivera is an 65 y.o. female.   Chief Complaint: bleeding from iv site HPI: 65 yr old female with multiple medical issues she comes in with a CVA. She had an IV that was placed by the EMTs in her left forearm on Thursday whenever she was admitted. This was discontinued yesterday and she had oozing from the site since then. She has been on Plavix and on aspirin as well as some Lovenox. Patient has petechiae on both hands which is new according to the patient's daughter. Patient is awake and alert and is able to answer questions although very slow to respond and slow to move her extremities. She has not had any other bleeding history and has no family history of anyone with bleeding disorders.  Past Medical History  Diagnosis Date  . Thyroid disease     hypothyroid  . Anxiety   . Hyperlipidemia   . Hypertension   . Other chest pain     chest discomfort  . Obesity   . Neuropathy ([place])   . Other esophagitis     erosive esophagitis  . Other specified gastritis without mention of hemorrhage     erosvie gastritis  . Pain in limb     Right leg pain  . Other specified disorders of adrenal glands 07/2007    adrenal mass via of CT 1.4cm left Adrenal no change(Dr.Cope)   . Other abnormal blood chemistry   . Diverticulosis of colon (without mention of hemorrhage) 05/20/2009    Internal hemms (Dr. Vira Agar)  . Irritable bowel syndrome     history of  . Insomnia, unspecified   . Nervous breakdown 08/2002    [facility]  . GERD (gastroesophageal reflux disease)     reflux HH 09/01/2002//egd reflux Esoph. HH Gastritis nonbleeding erosive gastropathy Duodentitis 08/15/2006  . Hyperglycemia   . Candidal vaginitis   . Forearm fracture     2013, left  . Anemia   . Anemia of other chronic disease 09/01/2013    Past Surgical History  Procedure Laterality Date  . Cholecystectomy  03/2004  . Abdominal hysterectomy  1995    Part Hyst BSO DUB ovarian cyst B9  . Vaginal delivery  1977  .  Breast reduction surgery  1984  . Cystoscopy  01/08/2008    Former site of inflammation healed (Dr. Jacqlyn Larsen)  . Nsvd x1  1977  . Esophagogastroduodenoscopy  1. 09/01/02  2. 08/15/06    1. Reflux, HH  2. Reflux esoph HH gaastritis nonbleeding erosive gastropathy duodenitis  . Colonoscopy  1. 08/15/06  2. 05/20/09    Int Hemms  2. Divertics Int Hemms (Dr. Vira Agar)  . Ct abd w & pelvis wo cm  6/09    CT Scan abd stable adrenal adenoma 1.4cm left adrenal no change (Dr. Jacqlyn Larsen)    Family History  Problem Relation Age of Onset  . Hypertension Mother   . Hyperlipidemia Mother   . Stroke Mother     42  . Vision loss Father     vision problems  . Cancer Father     Colon   . Hyperlipidemia Sister   . Hypertension Sister   . Vision loss Sister    Social History:  reports that she has never smoked. She has never used smokeless tobacco. She reports that she does not drink alcohol or use illicit drugs.  Allergies:  Allergies  Allergen Reactions  . Sulfonamide Derivatives     REACTION: rash  . Sulfa Antibiotics Rash  Medications Prior to Admission  Medication Sig Dispense Refill  . ALPRAZolam (XANAX) 0.25 MG tablet Take 1 tablet (0.25 mg total) by mouth 2 (two) times daily as needed. (Patient taking differently: Take 0.25 mg by mouth 2 (two) times daily. ) 180 tablet 1  . aspirin EC 81 MG tablet Take 81 mg by mouth daily.    . Blood Glucose Monitoring Suppl (ONE TOUCH ULTRA 2) W/DEVICE KIT Use to test blood sugar twice daily and as directed.  Diagnosis:  E11.40  Insulin-dependent. 1 each 0  . cholecalciferol (VITAMIN D) 1000 units tablet Take 1,000 Units by mouth daily.    . ferrous sulfate 325 (65 FE) MG tablet Take 1 tablet (325 mg total) by mouth daily with breakfast.    . glucose blood (ONE TOUCH ULTRA TEST) test strip Use as instructed to test blood sugar twice daily and as directed.  Diagnosis:  E11.40  Insulin dependent. 200 each 3  . Insulin Glargine (LANTUS SOLOSTAR) 100 UNIT/ML  Solostar Pen Inject 9 Units into the skin daily at 10 pm. If sugar 100-130, no change.  Add 1 unit if AM sugar >130. Dec 1 unit if <100. (Patient taking differently: Inject 9 Units into the skin at bedtime. If sugar 100-130, no change.  Add 1 unit if AM sugar >130. Dec 1 unit if <100.) 15 mL   . Lancets (ONETOUCH ULTRASOFT) lancets Use as instructed to test blood sugar twice daily and as directed.  Diagnosis:  E11.40   Insulin-dependent. 200 each 3  . levothyroxine (SYNTHROID, LEVOTHROID) 125 MCG tablet Take 1 tablet (125 mcg total) by mouth daily before breakfast. 90 tablet 3  . lisinopril (PRINIVIL,ZESTRIL) 5 MG tablet Take 1 tablet (5 mg total) by mouth daily. 90 tablet 3  . metFORMIN (GLUCOPHAGE) 1000 MG tablet Take 0.5 tablets (500 mg total) by mouth 2 (two) times daily with a meal. 90 tablet 3  . metoprolol succinate (TOPROL XL) 100 MG 24 hr tablet Take 1 tablet (100 mg total) by mouth daily. 30 tablet 3  . omeprazole (PRILOSEC) 40 MG capsule Take 1 capsule (40 mg total) by mouth 2 (two) times daily. 180 capsule 3  . pregabalin (LYRICA) 75 MG capsule 2 tabs in AM and 1 in PM. (Patient taking differently: Take 75-150 mg by mouth See admin instructions. 150 mg every morning and 75 mg every evening) 270 capsule 1  . ranitidine (ZANTAC) 300 MG tablet Take 1 tablet (300 mg total) by mouth daily. 90 tablet 3  . venlafaxine XR (EFFEXOR-XR) 150 MG 24 hr capsule Take 1 capsule (150 mg total) by mouth 2 (two) times daily. 180 capsule 3  . vitamin B-12 (CYANOCOBALAMIN) 1000 MCG tablet Take 1,000 mcg by mouth 2 (two) times daily.        Results for orders placed or performed during the hospital encounter of 03/11/15 (from the past 48 hour(s))  Glucose, capillary     Status: Abnormal   Collection Time: 03/11/15 11:44 PM  Result Value Ref Range   Glucose-Capillary 215 (H) 65 - 99 mg/dL   Comment 1 Notify RN   Lipid panel     Status: Abnormal   Collection Time: 03/12/15  5:18 AM  Result Value Ref Range    Cholesterol 250 (H) 0 - 200 mg/dL   Triglycerides 129 <150 mg/dL   HDL 64 >40 mg/dL   Total CHOL/HDL Ratio 3.9 RATIO   VLDL 26 0 - 40 mg/dL   LDL Cholesterol 160 (H) 0 - 99  mg/dL    Comment:        Total Cholesterol/HDL:CHD Risk Coronary Heart Disease Risk Table                     Men   Women  1/2 Average Risk   3.4   3.3  Average Risk       5.0   4.4  2 X Average Risk   9.6   7.1  3 X Average Risk  23.4   11.0        Use the calculated Patient Ratio above and the CHD Risk Table to determine the patient's CHD Risk.        ATP III CLASSIFICATION (LDL):  <100     mg/dL   Optimal  100-129  mg/dL   Near or Above                    Optimal  130-159  mg/dL   Borderline  160-189  mg/dL   High  >190     mg/dL   Very High   Hemoglobin A1c     Status: Abnormal   Collection Time: 03/12/15  5:18 AM  Result Value Ref Range   Hgb A1c MFr Bld 7.4 (H) 4.0 - 6.0 %  Basic metabolic panel     Status: Abnormal   Collection Time: 03/12/15  5:18 AM  Result Value Ref Range   Sodium 137 135 - 145 mmol/L   Potassium 3.4 (L) 3.5 - 5.1 mmol/L   Chloride 101 101 - 111 mmol/L   CO2 25 22 - 32 mmol/L   Glucose, Bld 182 (H) 65 - 99 mg/dL   BUN 18 6 - 20 mg/dL   Creatinine, Ser 0.78 0.44 - 1.00 mg/dL   Calcium 9.2 8.9 - 10.3 mg/dL   GFR calc non Af Amer >60 >60 mL/min   GFR calc Af Amer >60 >60 mL/min    Comment: (NOTE) The eGFR has been calculated using the CKD EPI equation. This calculation has not been validated in all clinical situations. eGFR's persistently <60 mL/min signify possible Chronic Kidney Disease.    Anion gap 11 5 - 15  CBC     Status: Abnormal   Collection Time: 03/12/15  5:18 AM  Result Value Ref Range   WBC 8.4 3.6 - 11.0 K/uL   RBC 4.25 3.80 - 5.20 MIL/uL   Hemoglobin 11.9 (L) 12.0 - 16.0 g/dL   HCT 35.4 35.0 - 47.0 %   MCV 83.3 80.0 - 100.0 fL   MCH 28.1 26.0 - 34.0 pg   MCHC 33.7 32.0 - 36.0 g/dL   RDW 14.9 (H) 11.5 - 14.5 %   Platelets 180 150 - 440 K/uL   Glucose, capillary     Status: Abnormal   Collection Time: 03/12/15  6:02 AM  Result Value Ref Range   Glucose-Capillary 164 (H) 65 - 99 mg/dL   Comment 1 Notify RN   Glucose, capillary     Status: Abnormal   Collection Time: 03/12/15  1:26 PM  Result Value Ref Range   Glucose-Capillary 161 (H) 65 - 99 mg/dL  Glucose, capillary     Status: Abnormal   Collection Time: 03/12/15  5:07 PM  Result Value Ref Range   Glucose-Capillary 132 (H) 65 - 99 mg/dL   Comment 1 Notify RN   Glucose, capillary     Status: Abnormal   Collection Time: 03/12/15  9:41 PM  Result Value Ref Range  Glucose-Capillary 220 (H) 65 - 99 mg/dL  Glucose, capillary     Status: Abnormal   Collection Time: 03/12/15 11:50 PM  Result Value Ref Range   Glucose-Capillary 220 (H) 65 - 99 mg/dL  CBC     Status: Abnormal   Collection Time: 03/13/15  5:32 AM  Result Value Ref Range   WBC 6.6 3.6 - 11.0 K/uL   RBC 3.85 3.80 - 5.20 MIL/uL   Hemoglobin 10.8 (L) 12.0 - 16.0 g/dL   HCT 32.0 (L) 35.0 - 47.0 %   MCV 83.1 80.0 - 100.0 fL   MCH 28.1 26.0 - 34.0 pg   MCHC 33.8 32.0 - 36.0 g/dL   RDW 15.4 (H) 11.5 - 14.5 %   Platelets 145 (L) 150 - 440 K/uL  Basic metabolic panel     Status: Abnormal   Collection Time: 03/13/15  5:32 AM  Result Value Ref Range   Sodium 139 135 - 145 mmol/L   Potassium 3.8 3.5 - 5.1 mmol/L   Chloride 105 101 - 111 mmol/L   CO2 29 22 - 32 mmol/L   Glucose, Bld 152 (H) 65 - 99 mg/dL   BUN 20 6 - 20 mg/dL   Creatinine, Ser 0.82 0.44 - 1.00 mg/dL   Calcium 9.5 8.9 - 10.3 mg/dL   GFR calc non Af Amer >60 >60 mL/min   GFR calc Af Amer >60 >60 mL/min    Comment: (NOTE) The eGFR has been calculated using the CKD EPI equation. This calculation has not been validated in all clinical situations. eGFR's persistently <60 mL/min signify possible Chronic Kidney Disease.    Anion gap 5 5 - 15  Protime-INR     Status: None   Collection Time: 03/13/15  5:32 AM  Result Value Ref Range    Prothrombin Time 14.3 11.4 - 15.0 seconds   INR 1.09   Glucose, capillary     Status: Abnormal   Collection Time: 03/13/15  5:39 AM  Result Value Ref Range   Glucose-Capillary 147 (H) 65 - 99 mg/dL  Glucose, capillary     Status: Abnormal   Collection Time: 03/13/15 11:34 AM  Result Value Ref Range   Glucose-Capillary 216 (H) 65 - 99 mg/dL   Comment 1 Notify RN    Ct Head Wo Contrast  03/11/2015  CLINICAL DATA:  The patient woke up at 4 a.m. last night with nausea and vomiting. Hypertension. Initial encounter. EXAM: CT HEAD WITHOUT CONTRAST TECHNIQUE: Contiguous axial images were obtained from the base of the skull through the vertex without intravenous contrast. COMPARISON:  Head CT scan 06/03/2014.  Brain MRI 05/27/2014. FINDINGS: Extensive chronic microvascular ischemic change is identified. No evidence of acute abnormality including infarct, hemorrhage, mass lesion, mass effect, midline shift or abnormal extra-axial fluid collection is seen. There is no hydrocephalus or pneumocephalus. The calvarium is intact. Imaged paranasal sinuses and mastoid air cells are clear. IMPRESSION: No acute abnormality. Extensive chronic microvascular ischemic change. Electronically Signed   By: Inge Rise M.D.   On: 03/11/2015 19:21   Mr Angiogram Neck W Wo Contrast  03/12/2015  CLINICAL DATA:  Lethargy, nausea and vomiting.  Hypertension. EXAM: MRA NECK WITHOUT AND WITH CONTRAST MRA HEAD WITHOUT CONTRAST TECHNIQUE: Multiplanar and multiecho pulse sequences of the neck were obtained without and with intravenous contrast. Angiographic images of the neck were obtained using MRA technique without and with intravenous contast.; Angiographic images of the Circle of Willis were obtained using MRA technique without intravenous contrast. CONTRAST:  71m MULTIHANCE GADOBENATE DIMEGLUMINE 5915MG/ML IV SOLN COMPARISON:  MRI 03/11/2015 FINDINGS: MRA NECK FINDINGS Branching pattern of the brachiocephalic vessels from the  arch is normal. Both common carotid arteries are widely patent to the bifurcation region. Both carotid bifurcations are widely patent without ICA stenosis or irregularity. There is mild narrowing of the proximal external carotid arteries. Both vertebral arteries are widely patent without significant proximal stenosis. Both appear normal through the cervical region. MRA HEAD FINDINGS Both internal carotid arteries are patent through the skullbase and siphon regions. The anterior and middle cerebral vessels are patent. Vessels show mild irregularity consistent with intracranial atherosclerotic change. There is an approximately 50% stenosis in the A1 segment on the right. More distal MCA branches vessels show some atherosclerotic irregularity, more extensive on the right than the left. There may even be a missing branch or 2 on the right in the temporal region. Both vertebral arteries are patent at the foramen magnum with the left being dominant. Both vertebral arteries reach the basilar. The distal right vertebral artery shows 50% narrowing. There is no basilar stenosis. Superior cerebellar and posterior cerebral arteries are patent, with the right PCA receiving most of it supply from the anterior circulation. More distal branch vessels in the PCA territories showed narrowing and irregularity. IMPRESSION: No significant disease in the neck. Wide patency of the carotid bifurcations and of the proximal vertebral arteries. Intracranial atherosclerotic disease with narrowing and irregularity of the medium and small vessels. Diminished number of MCA branches on the right compared to the left consistent with recent infarction in the right temporal lobe. 50% stenosis of the A1 segment on the right. Fairly considerable atherosclerotic irregularity of the more distal PCA branch vessels bilaterally. Electronically Signed   By: MNelson ChimesM.D.   On: 03/12/2015 11:48   Mr Brain Wo Contrast  03/11/2015  CLINICAL DATA:   Lethargy, nausea and vomiting. Hypertensive. History of hypertension, hyperlipidemia, diabetes. EXAM: MRI HEAD WITHOUT CONTRAST TECHNIQUE: Multiplanar, multiecho pulse sequences of the brain and surrounding structures were obtained without intravenous contrast. COMPARISON:  CT head March 11, 2015 at 1913 hours FINDINGS: Fast protocol used. 1 cm focus of reduced diffusion RIGHT posterior temporal lobe with corresponding low ADC value. The ventricles and sulci are normal for patient's age. No mass lesions, mass effect. Confluent supratentorial and to lesser extent pontine white matter FLAIR T2 hyperintensities. Old RIGHT basal ganglia lacunar infarct. A few scattered peripheral punctate foci of susceptibility artifact can be seen in setting of chronic hypertension. No lobar hematoma. No abnormal extra-axial fluid collections. No extra-axial masses though, contrast enhanced sequences would be more sensitive. Normal major intracranial vascular flow voids seen at the skull base. Ocular globes and orbital contents are unremarkable though not tailored for evaluation. No abnormal sellar expansion. No suspicious calvarial bone marrow signal. Craniocervical junction maintained. Visualized paranasal sinuses and mastoid air cells are well-aerated. IMPRESSION: 1 cm acute RIGHT posterior temporal lobe infarct. Severe white matter changes compatible with chronic small vessel ischemic disease. Old RIGHT basal ganglia lacunar infarct. Electronically Signed   By: CElon AlasM.D.   On: 03/11/2015 22:47   Ct Abdomen Pelvis W Contrast  03/11/2015  CLINICAL DATA:  Nausea and vomiting starting this morning at 4 a.m. EXAM: CT ABDOMEN AND PELVIS WITH CONTRAST TECHNIQUE: Multidetector CT imaging of the abdomen and pelvis was performed using the standard protocol following bolus administration of intravenous contrast. CONTRAST:  1052mOMNIPAQUE IOHEXOL 300 MG/ML  SOLN COMPARISON:  05/06/2009 and lumbar spine  12/19/2011 FINDINGS:  The lung bases are unremarkable. Sagittal images of the spine shows mild degenerative changes thoracolumbar spine. There is diffuse osteopenia. There is mild compression deformity upper endplate of L1 vertebral body. Schmorl's node deformity noted upper endplate of L27 vertebral body. The patient is status post cholecystectomy. No focal hepatic mass. Enhanced pancreas, spleen and right adrenal are stable. Stable fat containing lesion in left adrenal benign in nature measures 1.5 cm. Atherosclerotic calcifications of abdominal aorta and iliac arteries. No aortic aneurysm. There is mild right hydronephrosis and proximal right hydroureter. Mild dilatation of the left renal pelvis without frank hydronephrosis. No calcified ureteral calculi are noted bilaterally. There is a cyst in midpole posterior aspect of the right kidney measures 1.3 cm. Delayed renal images shows bilateral renal symmetrical excretion. Bilateral distal ureter is small caliber. Mild dilatation of mid right ureter. A distal right ureteral stricture cannot be excluded. Correlation with urology exam is recommended. No definite evidence of ureteral mass. Significant distended urinary bladder. The urinary bladder measures at least 19 cm cranial caudally by 8.8 cm AP diameter. There is no small bowel obstruction. No ascites or free air. No adenopathy. Normal appendix. No pericecal inflammation. Again noted a small supraumbilical hernia containing fat without evidence of acute complication. There is some loculated fluid umbilical region superficial measures 1.8 cm. Cellulitis or small umbilical abscess cannot be excluded. Clinical correlation is necessary. There is moderate stool noted in distal sigmoid colon and rectum. No colitis or diverticulitis. No distal colonic obstruction. Few diverticula are noted in descending colon. The patient is status post hysterectomy. IMPRESSION: 1. There is mild right hydronephrosis and proximal right hydroureter. No  calcified ureteral calculi are noted bilaterally. Delayed renal images shows bilateral renal symmetrical excretion. Correlation with urology exam is recommended to exclude subtle distal right ureteral stricture. 2. Significant distended urinary bladder. Urinary bladder measures at least 19 cm cranial caudally by 9 cm AP diameter. 3. No small bowel obstruction. 4. Status postcholecystectomy. 5. Stable fat containing lesion in left adrenal gland benign in nature. 6. There is small amount of loculated fluid in the umbilical region please see axial image 46 measures about 1.8 cm. Cellulitis or small abscess cannot be excluded. Clinical correlation is necessary. 7. Stable small supraumbilical hernia containing fat without evidence of acute complication. 8. Status post hysterectomy. 9. Atherosclerotic calcifications of abdominal aorta and iliac arteries. Electronically Signed   By: Lahoma Crocker M.D.   On: 03/11/2015 16:58   Dg Abd Acute W/chest  03/11/2015  CLINICAL DATA:  Left upper quadrant pain, constipation, nausea starting this morning EXAM: DG ABDOMEN ACUTE W/ 1V CHEST COMPARISON:  01/27/2013 FINDINGS: Cardiomediastinal silhouette is stable. No acute infiltrate or pleural effusion. No pulmonary edema. Mild dextroscoliosis lower thoracic spine. Mild levoscoliosis lumbar spine. There is normal small bowel gas pattern. Postcholecystectomy surgical clips are noted. Moderate stool noted in distal sigmoid colon and rectum. No free abdominal air. IMPRESSION: Negative abdominal radiographs. No acute cardiopulmonary disease. No free abdominal air. Postcholecystectomy surgical clips are noted. Thoracolumbar scoliosis. Moderate stool are noted in distal sigmoid colon and rectum. Electronically Signed   By: Lahoma Crocker M.D.   On: 03/11/2015 13:55   Mr Lovenia Kim  03/12/2015  CLINICAL DATA:  Lethargy, nausea and vomiting.  Hypertension. EXAM: MRA NECK WITHOUT AND WITH CONTRAST MRA HEAD WITHOUT CONTRAST TECHNIQUE: Multiplanar and  multiecho pulse sequences of the neck were obtained without and with intravenous contrast. Angiographic images of the neck were obtained using MRA technique without  and with intravenous contast.; Angiographic images of the Circle of Willis were obtained using MRA technique without intravenous contrast. CONTRAST:  58m MULTIHANCE GADOBENATE DIMEGLUMINE 529 MG/ML IV SOLN COMPARISON:  MRI 03/11/2015 FINDINGS: MRA NECK FINDINGS Branching pattern of the brachiocephalic vessels from the arch is normal. Both common carotid arteries are widely patent to the bifurcation region. Both carotid bifurcations are widely patent without ICA stenosis or irregularity. There is mild narrowing of the proximal external carotid arteries. Both vertebral arteries are widely patent without significant proximal stenosis. Both appear normal through the cervical region. MRA HEAD FINDINGS Both internal carotid arteries are patent through the skullbase and siphon regions. The anterior and middle cerebral vessels are patent. Vessels show mild irregularity consistent with intracranial atherosclerotic change. There is an approximately 50% stenosis in the A1 segment on the right. More distal MCA branches vessels show some atherosclerotic irregularity, more extensive on the right than the left. There may even be a missing branch or 2 on the right in the temporal region. Both vertebral arteries are patent at the foramen magnum with the left being dominant. Both vertebral arteries reach the basilar. The distal right vertebral artery shows 50% narrowing. There is no basilar stenosis. Superior cerebellar and posterior cerebral arteries are patent, with the right PCA receiving most of it supply from the anterior circulation. More distal branch vessels in the PCA territories showed narrowing and irregularity. IMPRESSION: No significant disease in the neck. Wide patency of the carotid bifurcations and of the proximal vertebral arteries. Intracranial  atherosclerotic disease with narrowing and irregularity of the medium and small vessels. Diminished number of MCA branches on the right compared to the left consistent with recent infarction in the right temporal lobe. 50% stenosis of the A1 segment on the right. Fairly considerable atherosclerotic irregularity of the more distal PCA branch vessels bilaterally. Electronically Signed   By: MNelson ChimesM.D.   On: 03/12/2015 11:48    Review of Systems  Constitutional: Positive for malaise/fatigue. Negative for fever and chills.  HENT: Negative for congestion and sore throat.   Respiratory: Negative for cough.   Cardiovascular: Negative for chest pain.  Gastrointestinal: Positive for nausea. Negative for vomiting and abdominal pain.  Genitourinary: Negative for dysuria and hematuria.  Neurological: Positive for dizziness, speech change, focal weakness and weakness.  Psychiatric/Behavioral: Positive for memory loss.  All other systems reviewed and are negative.   Blood pressure 163/86, pulse 72, temperature 98.3 F (36.8 C), temperature source Oral, resp. rate 18, height 5' (1.524 m), weight 136 lb (61.689 kg), SpO2 99 %. Physical Exam  Vitals reviewed. Constitutional: She is oriented to person, place, and time. She appears well-developed and well-nourished. No distress.  HENT:  Head: Normocephalic and atraumatic.  Right Ear: External ear normal.  Left Ear: External ear normal.  Nose: Nose normal.  Mouth/Throat: Oropharynx is clear and moist.  Eyes: Conjunctivae and EOM are normal. Pupils are equal, round, and reactive to light. No scleral icterus.  Neck: Normal range of motion. Neck supple. No tracheal deviation present.  Cardiovascular: Normal rate, regular rhythm, normal heart sounds and intact distal pulses.  Exam reveals no gallop and no friction rub.   No murmur heard. Respiratory: Effort normal and breath sounds normal. No respiratory distress.  GI: Soft. Bowel sounds are normal.  She exhibits no distension.  Musculoskeletal: Normal range of motion. She exhibits no edema or tenderness.  Neurological: She is alert and oriented to person, place, and time. She has normal reflexes.  Skin:  Skin is warm and dry.  Left forearm: Punctate area with some oozing, pressure was held for 15 minutes over this site, with some issues still remaining pressure held more proximal to this close to the antecubital fossa for 10 minutes and bleeding stops. Dry dressing with Coban was replaced onto this.  Both hands with petechiae covering both of them  Psychiatric: She has a normal mood and affect. Her behavior is normal. Judgment and thought content normal.     Assessment/Plan 65yrold female with CVA started on Plavix and aspirin also on some Lovenox, with bleeding from an IV site. Pressure held and bleeding stopped Coban dressing placed. If left arm does get swollen is to be elevated. Petechiae could be from platelet dysfunction or from my hit syndrome from the Lovenox. Would recommend evaluating platelets to ensure no further bleeding.   CHubbard Robinson MD 03/13/2015, 1:14 PM

## 2015-03-14 LAB — GLUCOSE, CAPILLARY
GLUCOSE-CAPILLARY: 192 mg/dL — AB (ref 65–99)
Glucose-Capillary: 202 mg/dL — ABNORMAL HIGH (ref 65–99)
Glucose-Capillary: 197 mg/dL — ABNORMAL HIGH (ref 65–99)
Glucose-Capillary: 222 mg/dL — ABNORMAL HIGH (ref 65–99)
Glucose-Capillary: 176 mg/dL — ABNORMAL HIGH (ref 65–99)

## 2015-03-14 LAB — BASIC METABOLIC PANEL
Anion gap: 10 (ref 5–15)
BUN: 21 mg/dL — AB (ref 6–20)
CALCIUM: 9.8 mg/dL (ref 8.9–10.3)
CO2: 26 mmol/L (ref 22–32)
CREATININE: 0.94 mg/dL (ref 0.44–1.00)
Chloride: 106 mmol/L (ref 101–111)
GFR calc Af Amer: >60 mL/min (ref >60)
GLUCOSE: 222 mg/dL — AB (ref 65–99)
Potassium: 3.6 mmol/L (ref 3.5–5.1)
Sodium: 142 mmol/L (ref 135–145)

## 2015-03-14 LAB — CBC
HCT: 35.8 % (ref 35.0–47.0)
HEMOGLOBIN: 11.7 g/dL — AB (ref 12.0–16.0)
MCH: 27.5 pg (ref 26.0–34.0)
MCHC: 32.6 g/dL (ref 32.0–36.0)
MCV: 84.4 fL (ref 80.0–100.0)
PLATELETS: 183 10*3/uL (ref 150–440)
RBC: 4.24 MIL/uL (ref 3.80–5.20)
RDW: 15.5 % — ABNORMAL HIGH (ref 11.5–14.5)
WBC: 8 10*3/uL (ref 3.6–11.0)

## 2015-03-14 LAB — RETICULOCYTES
RBC.: 4.24 MIL/uL (ref 3.80–5.20)
Retic Count, Absolute: 67.8 10*3/uL (ref 19.0–183.0)
Retic Ct Pct: 1.6 % (ref 0.4–3.1)

## 2015-03-14 LAB — FERRITIN: Ferritin: 159 ng/mL (ref 11–307)

## 2015-03-14 LAB — HEPARIN INDUCED PLATELET AB (HIT ANTIBODY): HEPARIN INDUCED PLT AB: 0.347 {OD_unit} (ref 0.000–0.400)

## 2015-03-14 LAB — IRON AND TIBC
Iron: 49 ug/dL (ref 28–170)
Saturation Ratios: 17 % (ref 10.4–31.8)
TIBC: 291 ug/dL (ref 250–450)
UIBC: 242 ug/dL

## 2015-03-14 LAB — FOLATE: Folate: 15.6 ng/mL (ref >5.9)

## 2015-03-14 LAB — VITAMIN B12: Vitamin B-12: 2760 pg/mL — ABNORMAL HIGH (ref 180–914)

## 2015-03-14 MED ORDER — METOPROLOL SUCCINATE ER 50 MG PO TB24
100.0000 mg | ORAL_TABLET | Freq: Every day | ORAL | Status: DC
  Administered 2015-03-14 – 2015-03-16 (×3): 100 mg via ORAL
  Filled 2015-03-14: qty 2
  Filled 2015-03-14: qty 1
  Filled 2015-03-14 (×2): qty 2

## 2015-03-14 MED ORDER — LISINOPRIL 5 MG PO TABS
5.0000 mg | ORAL_TABLET | Freq: Every day | ORAL | Status: DC
  Administered 2015-03-14: 11:00:00 5 mg via ORAL
  Filled 2015-03-14: qty 1

## 2015-03-14 NOTE — Progress Notes (Signed)
[facility] NAME: Elizabeth Rivera    MR#:  [id number]  DATE OF BIRTH:  [birth year]  SUBJECTIVE:  Patient's blood pressure was elevated and therefore she was feeling nauseous.  REVIEW OF SYSTEMS:    Review of Systems  Constitutional: Positive for malaise/fatigue. Negative for fever and chills.  HENT: Negative for sore throat.   Eyes: Negative for blurred vision.  Respiratory: Negative for cough, hemoptysis, shortness of breath and wheezing.   Cardiovascular: Negative for chest pain, palpitations and leg swelling.  Gastrointestinal: Positive for nausea. Negative for vomiting, abdominal pain, diarrhea and blood in stool.  Genitourinary: Negative for dysuria.  Musculoskeletal: Negative for back pain.  Neurological: Positive for dizziness and weakness. Negative for tremors and headaches.  Endo/Heme/Allergies: Does not bruise/bleed easily.    Tolerating Diet: Yes      DRUG ALLERGIES:   Allergies  Allergen Reactions  . Sulfonamide Derivatives     REACTION: rash  . Sulfa Antibiotics Rash    VITALS:  Blood pressure 145/75, pulse 79, temperature 98.4 F (36.9 C), temperature source Oral, resp. rate 18, height 5' (1.524 m), weight 61.689 kg (136 lb), SpO2 96 %.  PHYSICAL EXAMINATION:   Physical Exam  Constitutional: She is oriented to person, place, and time and well-developed, well-nourished, and in no distress. No distress.  HENT:  Head: Normocephalic.  Eyes: No scleral icterus.  Neck: Normal range of motion. Neck supple. No JVD present. No tracheal deviation present.  Cardiovascular: Normal rate, regular rhythm and normal heart sounds.  Exam reveals no gallop and no friction rub.   No murmur heard. Pulmonary/Chest: Effort normal and breath sounds normal. No respiratory distress. She has no wheezes. She has no rales. She exhibits no tenderness.  Abdominal: Soft. Bowel sounds are normal. She exhibits no distension and  no mass. There is no tenderness. There is no rebound and no guarding.  Musculoskeletal: Normal range of motion. She exhibits no edema.  Neurological: She is alert and oriented to person, place, and time. She displays normal reflexes. No cranial nerve deficit. She exhibits normal muscle tone. Coordination normal.  Minimal right lower extremity sided weakness  Skin: Skin is warm. No rash noted. No erythema.  Petechia on hands  Psychiatric: Affect and judgment normal.      LABORATORY PANEL:   CBC  Recent Labs Lab 03/14/15 0754  WBC 8.0  HGB 11.7*  HCT 35.8  PLT 183   ------------------------------------------------------------------------------------------------------------------  Chemistries   Recent Labs Lab 03/11/15 1227  03/14/15 0754  NA 137  < > 142  K 3.3*  < > 3.6  CL 101  < > 106  CO2 27  < > 26  GLUCOSE 255*  < > 222*  BUN 15  < > 21*  CREATININE 0.61  < > 0.94  CALCIUM 9.3  < > 9.8  AST 16  --   --   ALT 14  --   --   ALKPHOS 76  --   --   BILITOT 0.7  --   --   < > = values in this interval not displayed. ------------------------------------------------------------------------------------------------------------------  Cardiac Enzymes  Recent Labs Lab 03/11/15 1227  TROPONINI <0.03   ------------------------------------------------------------------------------------------------------------------  RADIOLOGY:  Ct Head Wo Contrast  03/11/2015  CLINICAL DATA:  The patient woke up at 4 a.m. last night with nausea and vomiting. Hypertension. Initial encounter. EXAM: CT HEAD WITHOUT CONTRAST TECHNIQUE: Contiguous axial images were obtained from the base of the  skull through the vertex without intravenous contrast. COMPARISON:  Head CT scan 06/03/2014.  Brain MRI 05/27/2014. FINDINGS: Extensive chronic microvascular ischemic change is identified. No evidence of acute abnormality including infarct, hemorrhage, mass lesion, mass effect, midline shift or  abnormal extra-axial fluid collection is seen. There is no hydrocephalus or pneumocephalus. The calvarium is intact. Imaged paranasal sinuses and mastoid air cells are clear. IMPRESSION: No acute abnormality. Extensive chronic microvascular ischemic change. Electronically Signed   By: Inge Rise M.D.   On: 03/11/2015 19:21   Mr Angiogram Neck W Wo Contrast  03/12/2015  CLINICAL DATA:  Lethargy, nausea and vomiting.  Hypertension. EXAM: MRA NECK WITHOUT AND WITH CONTRAST MRA HEAD WITHOUT CONTRAST TECHNIQUE: Multiplanar and multiecho pulse sequences of the neck were obtained without and with intravenous contrast. Angiographic images of the neck were obtained using MRA technique without and with intravenous contast.; Angiographic images of the Circle of Willis were obtained using MRA technique without intravenous contrast. CONTRAST:  57mL MULTIHANCE GADOBENATE DIMEGLUMINE 529 MG/ML IV SOLN COMPARISON:  MRI 03/11/2015 FINDINGS: MRA NECK FINDINGS Branching pattern of the brachiocephalic vessels from the arch is normal. Both common carotid arteries are widely patent to the bifurcation region. Both carotid bifurcations are widely patent without ICA stenosis or irregularity. There is mild narrowing of the proximal external carotid arteries. Both vertebral arteries are widely patent without significant proximal stenosis. Both appear normal through the cervical region. MRA HEAD FINDINGS Both internal carotid arteries are patent through the skullbase and siphon regions. The anterior and middle cerebral vessels are patent. Vessels show mild irregularity consistent with intracranial atherosclerotic change. There is an approximately 50% stenosis in the A1 segment on the right. More distal MCA branches vessels show some atherosclerotic irregularity, more extensive on the right than the left. There may even be a missing branch or 2 on the right in the temporal region. Both vertebral arteries are patent at the foramen magnum  with the left being dominant. Both vertebral arteries reach the basilar. The distal right vertebral artery shows 50% narrowing. There is no basilar stenosis. Superior cerebellar and posterior cerebral arteries are patent, with the right PCA receiving most of it supply from the anterior circulation. More distal branch vessels in the PCA territories showed narrowing and irregularity. IMPRESSION: No significant disease in the neck. Wide patency of the carotid bifurcations and of the proximal vertebral arteries. Intracranial atherosclerotic disease with narrowing and irregularity of the medium and small vessels. Diminished number of MCA branches on the right compared to the left consistent with recent infarction in the right temporal lobe. 50% stenosis of the A1 segment on the right. Fairly considerable atherosclerotic irregularity of the more distal PCA branch vessels bilaterally. Electronically Signed   By: Nelson Chimes M.D.   On: 03/12/2015 11:48   Mr Brain Wo Contrast  03/11/2015  CLINICAL DATA:  Lethargy, nausea and vomiting. Hypertensive. History of hypertension, hyperlipidemia, diabetes. EXAM: MRI HEAD WITHOUT CONTRAST TECHNIQUE: Multiplanar, multiecho pulse sequences of the brain and surrounding structures were obtained without intravenous contrast. COMPARISON:  CT head March 11, 2015 at 1913 hours FINDINGS: Fast protocol used. 1 cm focus of reduced diffusion RIGHT posterior temporal lobe with corresponding low ADC value. The ventricles and sulci are normal for patient's age. No mass lesions, mass effect. Confluent supratentorial and to lesser extent pontine white matter FLAIR T2 hyperintensities. Old RIGHT basal ganglia lacunar infarct. A few scattered peripheral punctate foci of susceptibility artifact can be seen in setting of chronic hypertension. No lobar  hematoma. No abnormal extra-axial fluid collections. No extra-axial masses though, contrast enhanced sequences would be more sensitive. Normal major  intracranial vascular flow voids seen at the skull base. Ocular globes and orbital contents are unremarkable though not tailored for evaluation. No abnormal sellar expansion. No suspicious calvarial bone marrow signal. Craniocervical junction maintained. Visualized paranasal sinuses and mastoid air cells are well-aerated. IMPRESSION: 1 cm acute RIGHT posterior temporal lobe infarct. Severe white matter changes compatible with chronic small vessel ischemic disease. Old RIGHT basal ganglia lacunar infarct. Electronically Signed   By: Elon Alas M.D.   On: 03/11/2015 22:47   Ct Abdomen Pelvis W Contrast  03/11/2015  CLINICAL DATA:  Nausea and vomiting starting this morning at 4 a.m. EXAM: CT ABDOMEN AND PELVIS WITH CONTRAST TECHNIQUE: Multidetector CT imaging of the abdomen and pelvis was performed using the standard protocol following bolus administration of intravenous contrast. CONTRAST:  136mL OMNIPAQUE IOHEXOL 300 MG/ML  SOLN COMPARISON:  05/06/2009 and lumbar spine 12/19/2011 FINDINGS: The lung bases are unremarkable. Sagittal images of the spine shows mild degenerative changes thoracolumbar spine. There is diffuse osteopenia. There is mild compression deformity upper endplate of L1 vertebral body. Schmorl's node deformity noted upper endplate of 624THL vertebral body. The patient is status post cholecystectomy. No focal hepatic mass. Enhanced pancreas, spleen and right adrenal are stable. Stable fat containing lesion in left adrenal benign in nature measures 1.5 cm. Atherosclerotic calcifications of abdominal aorta and iliac arteries. No aortic aneurysm. There is mild right hydronephrosis and proximal right hydroureter. Mild dilatation of the left renal pelvis without frank hydronephrosis. No calcified ureteral calculi are noted bilaterally. There is a cyst in midpole posterior aspect of the right kidney measures 1.3 cm. Delayed renal images shows bilateral renal symmetrical excretion. Bilateral distal  ureter is small caliber. Mild dilatation of mid right ureter. A distal right ureteral stricture cannot be excluded. Correlation with urology exam is recommended. No definite evidence of ureteral mass. Significant distended urinary bladder. The urinary bladder measures at least 19 cm cranial caudally by 8.8 cm AP diameter. There is no small bowel obstruction. No ascites or free air. No adenopathy. Normal appendix. No pericecal inflammation. Again noted a small supraumbilical hernia containing fat without evidence of acute complication. There is some loculated fluid umbilical region superficial measures 1.8 cm. Cellulitis or small umbilical abscess cannot be excluded. Clinical correlation is necessary. There is moderate stool noted in distal sigmoid colon and rectum. No colitis or diverticulitis. No distal colonic obstruction. Few diverticula are noted in descending colon. The patient is status post hysterectomy. IMPRESSION: 1. There is mild right hydronephrosis and proximal right hydroureter. No calcified ureteral calculi are noted bilaterally. Delayed renal images shows bilateral renal symmetrical excretion. Correlation with urology exam is recommended to exclude subtle distal right ureteral stricture. 2. Significant distended urinary bladder. Urinary bladder measures at least 19 cm cranial caudally by 9 cm AP diameter. 3. No small bowel obstruction. 4. Status postcholecystectomy. 5. Stable fat containing lesion in left adrenal gland benign in nature. 6. There is small amount of loculated fluid in the umbilical region please see axial image 46 measures about 1.8 cm. Cellulitis or small abscess cannot be excluded. Clinical correlation is necessary. 7. Stable small supraumbilical hernia containing fat without evidence of acute complication. 8. Status post hysterectomy. 9. Atherosclerotic calcifications of abdominal aorta and iliac arteries. Electronically Signed   By: Lahoma Crocker M.D.   On: 03/11/2015 16:58   Dg Abd  Acute W/chest  03/11/2015  CLINICAL  DATA:  Left upper quadrant pain, constipation, nausea starting this morning EXAM: DG ABDOMEN ACUTE W/ 1V CHEST COMPARISON:  01/27/2013 FINDINGS: Cardiomediastinal silhouette is stable. No acute infiltrate or pleural effusion. No pulmonary edema. Mild dextroscoliosis lower thoracic spine. Mild levoscoliosis lumbar spine. There is normal small bowel gas pattern. Postcholecystectomy surgical clips are noted. Moderate stool noted in distal sigmoid colon and rectum. No free abdominal air. IMPRESSION: Negative abdominal radiographs. No acute cardiopulmonary disease. No free abdominal air. Postcholecystectomy surgical clips are noted. Thoracolumbar scoliosis. Moderate stool are noted in distal sigmoid colon and rectum. Electronically Signed   By: Lahoma Crocker M.D.   On: 03/11/2015 13:55   Mr Lovenia Kim  03/12/2015  CLINICAL DATA:  Lethargy, nausea and vomiting.  Hypertension. EXAM: MRA NECK WITHOUT AND WITH CONTRAST MRA HEAD WITHOUT CONTRAST TECHNIQUE: Multiplanar and multiecho pulse sequences of the neck were obtained without and with intravenous contrast. Angiographic images of the neck were obtained using MRA technique without and with intravenous contast.; Angiographic images of the Circle of Willis were obtained using MRA technique without intravenous contrast. CONTRAST:  51mL MULTIHANCE GADOBENATE DIMEGLUMINE 529 MG/ML IV SOLN COMPARISON:  MRI 03/11/2015 FINDINGS: MRA NECK FINDINGS Branching pattern of the brachiocephalic vessels from the arch is normal. Both common carotid arteries are widely patent to the bifurcation region. Both carotid bifurcations are widely patent without ICA stenosis or irregularity. There is mild narrowing of the proximal external carotid arteries. Both vertebral arteries are widely patent without significant proximal stenosis. Both appear normal through the cervical region. MRA HEAD FINDINGS Both internal carotid arteries are patent through the skullbase  and siphon regions. The anterior and middle cerebral vessels are patent. Vessels show mild irregularity consistent with intracranial atherosclerotic change. There is an approximately 50% stenosis in the A1 segment on the right. More distal MCA branches vessels show some atherosclerotic irregularity, more extensive on the right than the left. There may even be a missing branch or 2 on the right in the temporal region. Both vertebral arteries are patent at the foramen magnum with the left being dominant. Both vertebral arteries reach the basilar. The distal right vertebral artery shows 50% narrowing. There is no basilar stenosis. Superior cerebellar and posterior cerebral arteries are patent, with the right PCA receiving most of it supply from the anterior circulation. More distal branch vessels in the PCA territories showed narrowing and irregularity. IMPRESSION: No significant disease in the neck. Wide patency of the carotid bifurcations and of the proximal vertebral arteries. Intracranial atherosclerotic disease with narrowing and irregularity of the medium and small vessels. Diminished number of MCA branches on the right compared to the left consistent with recent infarction in the right temporal lobe. 50% stenosis of the A1 segment on the right. Fairly considerable atherosclerotic irregularity of the more distal PCA branch vessels bilaterally. Electronically Signed   By: Nelson Chimes M.D.   On: 03/12/2015 11:48     ASSESSMENT AND PLAN:   65 year old female with a history of stroke presented with intractable nausea and vomiting and significantly elevated blood pressure and found to have 1 cm acute RIGHT posterior temporal lobe infarct.  1. 1 cm acute RIGHT posterior temporal lobe infarct: MRA shows no significant carotid stenosis or aneurysm. Echocardiogram shows normal ejection fraction without valvular abnormality. Occupational therapy is recommending skilled nursing facility. PT consult is still  pending. Continue Plavix and statin. LDL 160 not at goAl.   2. Intractable nausea and vomiting: This is due to CVA/hypertension and appears positional in  nature. continue meclizine  Continue supportive care.   3. Essential hypertension: I will restart her outpatient medications and monitor blood pressure.   4. Type 2 diabetes without medication: Continue sliding scale insulin and Lantus.  5. Hypothyroid: Continue Synthroid  6. Depression and anxiety: Continue Effexor and Xanax.  7. Bleeding from IV site:this has now stopped. She does have petechia on her hands. Platelet count is at baseline. Petechia is likely related to blood pressure cuff and possibly antiplatelet therapy. She needs antiplatelet therapy for her stroke.   she has been seen by oncology and surgical services.  Ferritin and iron are normal as well as folate.    Management plans discussed with the patient and she is in agreement.  CODE STATUS: FULL  TOTAL TIME TAKING CARE OF THIS PATIENT: 25 minutes.     POSSIBLE D/C today or tomorrow, DEPENDING ON CLINICAL CONDITION.   Markeith Jue M.D on 03/14/2015 at 9:51 AM  Between 7am to 6pm - Pager - [phone] After 6pm go to [email or web address] - password [email or web address] [facility]  Office  [phone]  CC: Primary care physician; Elsie Stain, MD  Note: This dictation was prepared with Dragon dictation along with smaller phrase technology. Any transcriptional errors that result from this process are unintentional.

## 2015-03-14 NOTE — Progress Notes (Signed)
[facility]  Date of admission:  03/11/2015  Inpatient day:  03/14/2015  Consulting physician: Bettey Costa, MD  Reason for Consultation:  Patient with bleeding arm and petechiae ? Plavix vs HIT?  Chief Complaint: Elizabeth Rivera is a 65 y.o. female with a history of CVA admitted with admitted with intractable nausea nd vomiting and subsequent imaging revealing an occipital infarct.  Subjective: The patient denies any new complaints.  No bleeding.  Past Medical History  Diagnosis Date  . Thyroid disease     hypothyroid  . Anxiety   . Hyperlipidemia   . Hypertension   . Other chest pain     chest discomfort  . Obesity   . Neuropathy ([place])   . Other esophagitis     erosive esophagitis  . Other specified gastritis without mention of hemorrhage     erosvie gastritis  . Pain in limb     Right leg pain  . Other specified disorders of adrenal glands 07/2007    adrenal mass via of CT 1.4cm left Adrenal no change(Dr.Cope)   . Other abnormal blood chemistry   . Diverticulosis of colon (without mention of hemorrhage) 05/20/2009    Internal hemms (Dr. Vira Agar)  . Irritable bowel syndrome     history of  . Insomnia, unspecified   . Nervous breakdown 08/2002    [facility]  . GERD (gastroesophageal reflux disease)     reflux HH 09/01/2002//egd reflux Esoph. HH Gastritis nonbleeding erosive gastropathy Duodentitis 08/15/2006  . Hyperglycemia   . Candidal vaginitis   . Forearm fracture     2013, left  . Anemia   . Anemia of other chronic disease 09/01/2013    Past Surgical History  Procedure Laterality Date  . Cholecystectomy  03/2004  . Abdominal hysterectomy  1995    Part Hyst BSO DUB ovarian cyst B9  . Vaginal delivery  1977  . Breast reduction surgery  1984  . Cystoscopy  01/08/2008    Former site of inflammation healed (Dr. Jacqlyn Larsen)  . Nsvd x1  1977  . Esophagogastroduodenoscopy  1. 09/01/02  2. 08/15/06    1. Reflux, HH  2. Reflux esoph HH gaastritis  nonbleeding erosive gastropathy duodenitis  . Colonoscopy  1. 08/15/06  2. 05/20/09    Int Hemms  2. Divertics Int Hemms (Dr. Vira Agar)  . Ct abd w & pelvis wo cm  6/09    CT Scan abd stable adrenal adenoma 1.4cm left adrenal no change (Dr. Jacqlyn Larsen)    Family History  Problem Relation Age of Onset  . Hypertension Mother   . Hyperlipidemia Mother   . Stroke Mother     79  . Vision loss Father     vision problems  . Cancer Father     Colon   . Hyperlipidemia Sister   . Hypertension Sister   . Vision loss Sister     Social History:  reports that she has never smoked. She has never used smokeless tobacco. She reports that she does not drink alcohol or use illicit drugs.  She is alone today.  Allergies:  Allergies  Allergen Reactions  . Sulfonamide Derivatives     REACTION: rash  . Sulfa Antibiotics Rash    Medications Prior to Admission  Medication Sig Dispense Refill  . ALPRAZolam (XANAX) 0.25 MG tablet Take 1 tablet (0.25 mg total) by mouth 2 (two) times daily as needed. (Patient taking differently: Take 0.25 mg by mouth 2 (two) times daily. ) 180 tablet  1  . aspirin EC 81 MG tablet Take 81 mg by mouth daily.    . Blood Glucose Monitoring Suppl (ONE TOUCH ULTRA 2) W/DEVICE KIT Use to test blood sugar twice daily and as directed.  Diagnosis:  E11.40  Insulin-dependent. 1 each 0  . cholecalciferol (VITAMIN D) 1000 units tablet Take 1,000 Units by mouth daily.    . ferrous sulfate 325 (65 FE) MG tablet Take 1 tablet (325 mg total) by mouth daily with breakfast.    . glucose blood (ONE TOUCH ULTRA TEST) test strip Use as instructed to test blood sugar twice daily and as directed.  Diagnosis:  E11.40  Insulin dependent. 200 each 3  . Insulin Glargine (LANTUS SOLOSTAR) 100 UNIT/ML Solostar Pen Inject 9 Units into the skin daily at 10 pm. If sugar 100-130, no change.  Add 1 unit if AM sugar >130. Dec 1 unit if <100. (Patient taking differently: Inject 9 Units into the skin at bedtime. If  sugar 100-130, no change.  Add 1 unit if AM sugar >130. Dec 1 unit if <100.) 15 mL   . Lancets (ONETOUCH ULTRASOFT) lancets Use as instructed to test blood sugar twice daily and as directed.  Diagnosis:  E11.40   Insulin-dependent. 200 each 3  . levothyroxine (SYNTHROID, LEVOTHROID) 125 MCG tablet Take 1 tablet (125 mcg total) by mouth daily before breakfast. 90 tablet 3  . lisinopril (PRINIVIL,ZESTRIL) 5 MG tablet Take 1 tablet (5 mg total) by mouth daily. 90 tablet 3  . metFORMIN (GLUCOPHAGE) 1000 MG tablet Take 0.5 tablets (500 mg total) by mouth 2 (two) times daily with a meal. 90 tablet 3  . metoprolol succinate (TOPROL XL) 100 MG 24 hr tablet Take 1 tablet (100 mg total) by mouth daily. 30 tablet 3  . omeprazole (PRILOSEC) 40 MG capsule Take 1 capsule (40 mg total) by mouth 2 (two) times daily. 180 capsule 3  . pregabalin (LYRICA) 75 MG capsule 2 tabs in AM and 1 in PM. (Patient taking differently: Take 75-150 mg by mouth See admin instructions. 150 mg every morning and 75 mg every evening) 270 capsule 1  . ranitidine (ZANTAC) 300 MG tablet Take 1 tablet (300 mg total) by mouth daily. 90 tablet 3  . venlafaxine XR (EFFEXOR-XR) 150 MG 24 hr capsule Take 1 capsule (150 mg total) by mouth 2 (two) times daily. 180 capsule 3  . vitamin B-12 (CYANOCOBALAMIN) 1000 MCG tablet Take 1,000 mcg by mouth 2 (two) times daily.        Review of Systems: GENERAL:  Fatigue.  "Slow".  No fevers or sweats.  Weight loss after 1st CVA in 2016 (approximately 35 pounds). PERFORMANCE STATUS (ECOG):  2-3 HEENT:  No visual changes, runny nose, sore throat, mouth sores or tenderness. Lungs: No shortness of breath or cough.  No hemoptysis. Cardiac:  No chest pain, palpitations, orthopnea, or PND. GI:  Poor appetite.  Black stool at times.  No nausea, vomiting, diarrhea, constipation, or hematochezia.  Colonoscopy < 5 years ago.   GU:  No urgency, frequency, dysuria, or hematuria. Musculoskeletal:  No back pain.  No  joint pain.  No muscle tenderness. Extremities:  No pain or swelling. Skin:  No rashes or skin changes. Neuro:  Slow since 1st CVA.  Unable to multi-task.  No headache, numbness or weakness, balance or coordination issues. Endocrine:  Diabetes.  Thyroid disease on Synthroid.  No hot flashes or night sweats. Psych:  No mood changes, depression or anxiety. Pain:  No  focal pain. Review of systems:  All other systems reviewed and found to be negative.  Physical Exam:  Blood pressure 149/82, pulse 89, temperature 98.4 F (36.9 C), temperature source Oral, resp. rate 18, height 5' (1.524 m), weight 136 lb (61.689 kg), SpO2 100 %.  GENERAL:  Pale woman sitting in a lounge chair looking out the window on the medical unit in no acute distress. MENTAL STATUS:  Alert and oriented to person, place and time. HEAD:  Pearline Cables hair.  Normocephalic, atraumatic, face symmetric, no Cushingoid features. EYES:  Blue eyes.  No conjunctivitis or scleral icterus. ENT:  Oropharynx dry.  No lesions.  Tongue normal.  SKIN:  Dorsum of right hand withfading petechiae confluent (near thumb).  Left hand without petechiae.  No other petechiae. No rashes, ulcers or lesions. EXTREMITIES: Left forearm wrapped.  No edema, no skin discoloration or tenderness.  No palpable cords. NEUROLOGICAL: Slow.  Moves all 4 extremities. PSYCH:  Appropriate.  Results for orders placed or performed during the hospital encounter of 03/11/15 (from the past 48 hour(s))  Glucose, capillary     Status: Abnormal   Collection Time: 03/12/15  5:07 PM  Result Value Ref Range   Glucose-Capillary 132 (H) 65 - 99 mg/dL   Comment 1 Notify RN   Glucose, capillary     Status: Abnormal   Collection Time: 03/12/15  9:41 PM  Result Value Ref Range   Glucose-Capillary 220 (H) 65 - 99 mg/dL  Glucose, capillary     Status: Abnormal   Collection Time: 03/12/15 11:50 PM  Result Value Ref Range   Glucose-Capillary 220 (H) 65 - 99 mg/dL  CBC     Status:  Abnormal   Collection Time: 03/13/15  5:32 AM  Result Value Ref Range   WBC 6.6 3.6 - 11.0 K/uL   RBC 3.85 3.80 - 5.20 MIL/uL   Hemoglobin 10.8 (L) 12.0 - 16.0 g/dL   HCT 32.0 (L) 35.0 - 47.0 %   MCV 83.1 80.0 - 100.0 fL   MCH 28.1 26.0 - 34.0 pg   MCHC 33.8 32.0 - 36.0 g/dL   RDW 15.4 (H) 11.5 - 14.5 %   Platelets 145 (L) 150 - 440 K/uL  Basic metabolic panel     Status: Abnormal   Collection Time: 03/13/15  5:32 AM  Result Value Ref Range   Sodium 139 135 - 145 mmol/L   Potassium 3.8 3.5 - 5.1 mmol/L   Chloride 105 101 - 111 mmol/L   CO2 29 22 - 32 mmol/L   Glucose, Bld 152 (H) 65 - 99 mg/dL   BUN 20 6 - 20 mg/dL   Creatinine, Ser 0.82 0.44 - 1.00 mg/dL   Calcium 9.5 8.9 - 10.3 mg/dL   GFR calc non Af Amer >60 >60 mL/min   GFR calc Af Amer >60 >60 mL/min    Comment: (NOTE) The eGFR has been calculated using the CKD EPI equation. This calculation has not been validated in all clinical situations. eGFR's persistently <60 mL/min signify possible Chronic Kidney Disease.    Anion gap 5 5 - 15  Protime-INR     Status: None   Collection Time: 03/13/15  5:32 AM  Result Value Ref Range   Prothrombin Time 14.3 11.4 - 15.0 seconds   INR 1.09   Glucose, capillary     Status: Abnormal   Collection Time: 03/13/15  5:39 AM  Result Value Ref Range   Glucose-Capillary 147 (H) 65 - 99 mg/dL  Glucose, capillary  Status: Abnormal   Collection Time: 03/13/15 11:34 AM  Result Value Ref Range   Glucose-Capillary 216 (H) 65 - 99 mg/dL   Comment 1 Notify RN   Glucose, capillary     Status: Abnormal   Collection Time: 03/13/15  1:22 PM  Result Value Ref Range   Glucose-Capillary 221 (H) 65 - 99 mg/dL  Glucose, capillary     Status: Abnormal   Collection Time: 03/13/15  5:14 PM  Result Value Ref Range   Glucose-Capillary 163 (H) 65 - 99 mg/dL   Comment 1 Notify RN   Glucose, capillary     Status: Abnormal   Collection Time: 03/13/15  9:25 PM  Result Value Ref Range    Glucose-Capillary 191 (H) 65 - 99 mg/dL   Comment 1 Notify RN   Glucose, capillary     Status: Abnormal   Collection Time: 03/14/15 12:19 AM  Result Value Ref Range   Glucose-Capillary 192 (H) 65 - 99 mg/dL   Comment 1 Notify RN   Glucose, capillary     Status: Abnormal   Collection Time: 03/14/15  5:27 AM  Result Value Ref Range   Glucose-Capillary 202 (H) 65 - 99 mg/dL  Glucose, capillary     Status: Abnormal   Collection Time: 03/14/15  7:24 AM  Result Value Ref Range   Glucose-Capillary 197 (H) 65 - 99 mg/dL  CBC     Status: Abnormal   Collection Time: 03/14/15  7:54 AM  Result Value Ref Range   WBC 8.0 3.6 - 11.0 K/uL   RBC 4.24 3.80 - 5.20 MIL/uL   Hemoglobin 11.7 (L) 12.0 - 16.0 g/dL   HCT 35.8 35.0 - 47.0 %   MCV 84.4 80.0 - 100.0 fL   MCH 27.5 26.0 - 34.0 pg   MCHC 32.6 32.0 - 36.0 g/dL   RDW 15.5 (H) 11.5 - 14.5 %   Platelets 183 150 - 440 K/uL  Basic metabolic panel     Status: Abnormal   Collection Time: 03/14/15  7:54 AM  Result Value Ref Range   Sodium 142 135 - 145 mmol/L   Potassium 3.6 3.5 - 5.1 mmol/L   Chloride 106 101 - 111 mmol/L   CO2 26 22 - 32 mmol/L   Glucose, Bld 222 (H) 65 - 99 mg/dL   BUN 21 (H) 6 - 20 mg/dL   Creatinine, Ser 0.94 0.44 - 1.00 mg/dL   Calcium 9.8 8.9 - 10.3 mg/dL   GFR calc non Af Amer >60 >60 mL/min   GFR calc Af Amer >60 >60 mL/min    Comment: (NOTE) The eGFR has been calculated using the CKD EPI equation. This calculation has not been validated in all clinical situations. eGFR's persistently <60 mL/min signify possible Chronic Kidney Disease.    Anion gap 10 5 - 15  Ferritin     Status: None   Collection Time: 03/14/15  7:54 AM  Result Value Ref Range   Ferritin 159 11 - 307 ng/mL  Iron and TIBC     Status: None   Collection Time: 03/14/15  7:54 AM  Result Value Ref Range   Iron 49 28 - 170 ug/dL   TIBC 291 250 - 450 ug/dL   Saturation Ratios 17 10.4 - 31.8 %   UIBC 242 ug/dL  Reticulocytes     Status: None    Collection Time: 03/14/15  7:54 AM  Result Value Ref Range   Retic Ct Pct 1.6 0.4 - 3.1 %  RBC. 4.24 3.80 - 5.20 MIL/uL   Retic Count, Manual 67.8 19.0 - 183.0 K/uL  Folate     Status: None   Collection Time: 03/14/15  7:54 AM  Result Value Ref Range   Folate 15.6 >5.9 ng/mL  Glucose, capillary     Status: Abnormal   Collection Time: 03/14/15 11:43 AM  Result Value Ref Range   Glucose-Capillary 222 (H) 65 - 99 mg/dL   No results found.  Assessment:  The patient is a 65 y.o. woman with a history of CVA in 05/2014.  She presented with intractable nausea and vomiting, hypertension, and a new right posterior temporal CVA.  Prior to admission, she was taking a baby aspirin and Excedrin for a headache.  She has recently started Lovenox and Plavix.  She has some petechiae on the dorsum of her right hand and a few on her left hand possibly related to BP cuff and antiplatelet therapy.  Platelet count is at baseline with no evidence of HIT.  She has chronic anemia.  Diet is poor.  Her daughter notes intermittent black stools.  She had a colonoscopy < 5 years ago.  She has lost 35 pounds since her first CVA in 05/2014.  Plan:   1.  Hematology/Oncology:  Petechial rash on right hand fading.  No petechiae on left hand.  Platelet count improved (145,000 to 183,000).  Anemia work-up with normal ferritin, iron studies, and folate.  B12 pending.  Will follow-up pending lupus anticoagulant, anticardiolipin antibodies. 2.  Disposition:  Anticipate discharge to skilled nursing facility.  Thank you for allowing me to participate in Elizabeth Rivera 's care.  I will follow her closely with you while hospitalized and after discharge in the outpatient department.  Lequita Asal, MD  03/14/2015, 1:42 PM

## 2015-03-14 NOTE — Evaluation (Signed)
Physical Therapy Evaluation Patient Details Name: Elizabeth Rivera MRN: [id number] DOB: 1950/10/09 Today's Date: 03/14/2015   History of Present Illness  This patient is a 65 year old female who came to [facility] with vomiting.  Found to have had a CVA, she is feeling tired at time of eval.  Clinical Impression  Pt shows good effort with PT exam but is very limited with what she is able to do and very slow and deliberate with all acts.  She shows decreased strength, but more so coordination, with the exercises and testing.  She has flat affect t/o the session which her husband reports is not her baseline.  Pt did not have any LOBs, but was slow and unsure of herself and would benefit from rehab as she is not at all close to her PLOF.     Follow Up Recommendations SNF    Equipment Recommendations       Recommendations for Other Services       Precautions / Restrictions Precautions Precautions: Fall Restrictions Weight Bearing Restrictions: No      Mobility  Bed Mobility Overal bed mobility: Needs Assistance Bed Mobility: Supine to Sit     Supine to sit: Min assist        Transfers Overall transfer level: Needs assistance Equipment used: Rolling walker (2 wheeled) Transfers: Sit to/from Stand Sit to Stand: Min guard;Min assist         General transfer comment: Pt shows good effort with getting to standing, is very slow and deliberate and needs excessive cuing.  Ambulation/Gait Ambulation/Gait assistance: Min assist Ambulation Distance (Feet): 10 Feet Assistive device: Rolling walker (2 wheeled)       General Gait Details: Pt with very slow, shuffling steps despite much cuing and assist.  Pt did try to assist with advancing walker for her and she did a little better.  Pt is not able to organize taking bigger steps on her own and is very different from her baseline.   Stairs            Wheelchair Mobility    Modified Rankin (Stroke Patients Only)        Balance                                             Pertinent Vitals/Pain Pain Assessment: No/denies pain    Home Living Family/patient expects to be discharged to:: Skilled nursing facility Living Arrangements: Spouse/significant other             Home Equipment: Kasandra Knudsen - single point      Prior Function Level of Independence: Independent with assistive device(s)         Comments: Pt mostly in the home, able to do some limited activites/chores.     Hand Dominance        Extremity/Trunk Assessment   Upper Extremity Assessment: Generalized weakness (L LE grossly 3+/5 w/ decreased coordination, R grossly 4-/5)           Lower Extremity Assessment: Generalized weakness (generally appears to have equal strength b/l)         Communication   Communication: No difficulties  Cognition Arousal/Alertness: Lethargic Behavior During Therapy: WFL for tasks assessed/performed Overall Cognitive Status: Impaired/Different from baseline (husband reports that she's slower than normal with responses)  General Comments      Exercises        Assessment/Plan    PT Assessment Patient needs continued PT services  PT Diagnosis Difficulty walking;Generalized weakness   PT Problem List Decreased strength;Decreased range of motion;Decreased activity tolerance;Decreased balance;Decreased mobility;Decreased coordination;Decreased cognition;Decreased knowledge of use of DME;Decreased safety awareness  PT Treatment Interventions DME instruction;Gait training;Stair training;Functional mobility training;Therapeutic activities;Therapeutic exercise;Balance training;Neuromuscular re-education   PT Goals (Current goals can be found in the Care Plan section) Acute Rehab PT Goals Patient Stated Goal: I wanted to go home, but I think you are right it's not the bed idea PT Goal Formulation: With patient/family Time For Goal Achievement:  03/28/15 Potential to Achieve Goals: Fair    Frequency 7X/week   Barriers to discharge        Co-evaluation               End of Session Equipment Utilized During Treatment: Gait belt Activity Tolerance:  (decreased ability to remain engaged with instruction) Patient left: with call bell/phone within reach;with chair alarm set           Time: [id number] PT Time Calculation (min) (ACUTE ONLY): 36 min   Charges:   PT Evaluation $PT Eval Moderate Complexity: 1 Procedure     PT G Codes:       Wayne Both, PT, DPT [phone]  Kreg Shropshire 03/14/2015, 1:32 PM

## 2015-03-15 ENCOUNTER — Encounter: Payer: Self-pay | Admitting: Family Medicine

## 2015-03-15 LAB — GLUCOSE, CAPILLARY
GLUCOSE-CAPILLARY: 107 mg/dL — AB (ref 65–99)
GLUCOSE-CAPILLARY: 131 mg/dL — AB (ref 65–99)
GLUCOSE-CAPILLARY: 135 mg/dL — AB (ref 65–99)
GLUCOSE-CAPILLARY: 198 mg/dL — AB (ref 65–99)
GLUCOSE-CAPILLARY: 249 mg/dL — AB (ref 65–99)
Glucose-Capillary: 248 mg/dL — ABNORMAL HIGH (ref 65–99)

## 2015-03-15 MED ORDER — ALPRAZOLAM 0.25 MG PO TABS: 0.2500 mg | ORAL_TABLET | Freq: Two times a day (BID) | ORAL | Status: AC

## 2015-03-15 MED ORDER — ATORVASTATIN CALCIUM 40 MG PO TABS: 40.0000 mg | ORAL_TABLET | Freq: Every day | ORAL | Status: DC

## 2015-03-15 MED ORDER — CLOPIDOGREL BISULFATE 75 MG PO TABS: 75.0000 mg | ORAL_TABLET | Freq: Every day | ORAL | Status: DC

## 2015-03-15 MED ORDER — LIDOCAINE 5 % EX OINT
TOPICAL_OINTMENT | Freq: Three times a day (TID) | CUTANEOUS | Status: DC | PRN
  Administered 2015-03-16 (×2): via TOPICAL
  Filled 2015-03-15: qty 35.44

## 2015-03-15 MED ORDER — LISINOPRIL 20 MG PO TABS: 20.0000 mg | ORAL_TABLET | Freq: Every day | ORAL | Status: DC

## 2015-03-15 MED ORDER — GLUCERNA SHAKE PO LIQD: 237.0000 mL | Freq: Two times a day (BID) | ORAL | Status: DC

## 2015-03-15 MED ORDER — LISINOPRIL 20 MG PO TABS
20.0000 mg | ORAL_TABLET | Freq: Every day | ORAL | Status: DC
  Administered 2015-03-15: 11:00:00 20 mg via ORAL
  Filled 2015-03-15: qty 1

## 2015-03-15 MED ORDER — ONDANSETRON 4 MG PO TBDP: 4.0000 mg | ORAL_TABLET | Freq: Three times a day (TID) | ORAL | Status: DC | PRN

## 2015-03-15 NOTE — Progress Notes (Signed)
[facility] NAME: Elizabeth Rivera    MR#:  [id number]  DATE OF BIRTH:  1950/12/04  SUBJECTIVE:  Patient seen this am and was ready for discharge to rehab however, now doe snot feel she is ready  REVIEW OF SYSTEMS:    Review of Systems  Constitutional: Negative for fever, chills, weight loss and malaise/fatigue.  HENT: Negative for sore throat.   Eyes: Negative for blurred vision.  Respiratory: Negative for cough, hemoptysis, shortness of breath and wheezing.   Cardiovascular: Negative for chest pain, palpitations and leg swelling.  Gastrointestinal: Negative for vomiting, abdominal pain, diarrhea and blood in stool.  Genitourinary: Negative for dysuria.  Musculoskeletal: Negative for back pain.  Neurological: Positive for dizziness and weakness. Negative for tremors and headaches.  Endo/Heme/Allergies: Does not bruise/bleed easily.    Tolerating Diet: Yes      DRUG ALLERGIES:   Allergies  Allergen Reactions  . Sulfonamide Derivatives     REACTION: rash  . Sulfa Antibiotics Rash    VITALS:  Blood pressure 132/58, pulse 68, temperature 97.9 F (36.6 C), temperature source Oral, resp. rate 20, height 5' (1.524 m), weight 61.689 kg (136 lb), SpO2 97 %.  PHYSICAL EXAMINATION:   Physical Exam  Constitutional: She is oriented to person, place, and time and well-developed, well-nourished, and in no distress. No distress.  HENT:  Head: Normocephalic.  Eyes: No scleral icterus.  Neck: Normal range of motion. Neck supple. No JVD present. No tracheal deviation present.  Cardiovascular: Normal rate, regular rhythm and normal heart sounds.  Exam reveals no gallop and no friction rub.   No murmur heard. Pulmonary/Chest: Effort normal and breath sounds normal. No respiratory distress. She has no wheezes. She has no rales. She exhibits no tenderness.  Abdominal: Soft. Bowel sounds are normal. She exhibits no distension and  no mass. There is no tenderness. There is no rebound and no guarding.  Musculoskeletal: Normal range of motion. She exhibits no edema.  Neurological: She is alert and oriented to person, place, and time. She displays normal reflexes. No cranial nerve deficit. She exhibits normal muscle tone. Coordination normal.  Minimal right lower extremity sided weakness Slight facial droop  Skin: Skin is warm. No rash noted. No erythema.  Petechia on hands  Psychiatric: Affect and judgment normal.      LABORATORY PANEL:   CBC  Recent Labs Lab 03/14/15 0754  WBC 8.0  HGB 11.7*  HCT 35.8  PLT 183   ------------------------------------------------------------------------------------------------------------------  Chemistries   Recent Labs Lab 03/11/15 1227  03/14/15 0754  NA 137  < > 142  K 3.3*  < > 3.6  CL 101  < > 106  CO2 27  < > 26  GLUCOSE 255*  < > 222*  BUN 15  < > 21*  CREATININE 0.61  < > 0.94  CALCIUM 9.3  < > 9.8  AST 16  --   --   ALT 14  --   --   ALKPHOS 76  --   --   BILITOT 0.7  --   --   < > = values in this interval not displayed. ------------------------------------------------------------------------------------------------------------------  Cardiac Enzymes  Recent Labs Lab 03/11/15 1227  TROPONINI <0.03   ------------------------------------------------------------------------------------------------------------------  RADIOLOGY:  Ct Head Wo Contrast  03/11/2015  CLINICAL DATA:  The patient woke up at 4 a.m. last night with nausea and vomiting. Hypertension. Initial encounter. EXAM: CT HEAD WITHOUT CONTRAST TECHNIQUE: Contiguous axial  images were obtained from the base of the skull through the vertex without intravenous contrast. COMPARISON:  Head CT scan 06/03/2014.  Brain MRI 05/27/2014. FINDINGS: Extensive chronic microvascular ischemic change is identified. No evidence of acute abnormality including infarct, hemorrhage, mass lesion, mass effect,  midline shift or abnormal extra-axial fluid collection is seen. There is no hydrocephalus or pneumocephalus. The calvarium is intact. Imaged paranasal sinuses and mastoid air cells are clear. IMPRESSION: No acute abnormality. Extensive chronic microvascular ischemic change. Electronically Signed   By: Inge Rise M.D.   On: 03/11/2015 19:21   Mr Angiogram Neck W Wo Contrast  03/12/2015  CLINICAL DATA:  Lethargy, nausea and vomiting.  Hypertension. EXAM: MRA NECK WITHOUT AND WITH CONTRAST MRA HEAD WITHOUT CONTRAST TECHNIQUE: Multiplanar and multiecho pulse sequences of the neck were obtained without and with intravenous contrast. Angiographic images of the neck were obtained using MRA technique without and with intravenous contast.; Angiographic images of the Circle of Willis were obtained using MRA technique without intravenous contrast. CONTRAST:  60mL MULTIHANCE GADOBENATE DIMEGLUMINE 529 MG/ML IV SOLN COMPARISON:  MRI 03/11/2015 FINDINGS: MRA NECK FINDINGS Branching pattern of the brachiocephalic vessels from the arch is normal. Both common carotid arteries are widely patent to the bifurcation region. Both carotid bifurcations are widely patent without ICA stenosis or irregularity. There is mild narrowing of the proximal external carotid arteries. Both vertebral arteries are widely patent without significant proximal stenosis. Both appear normal through the cervical region. MRA HEAD FINDINGS Both internal carotid arteries are patent through the skullbase and siphon regions. The anterior and middle cerebral vessels are patent. Vessels show mild irregularity consistent with intracranial atherosclerotic change. There is an approximately 50% stenosis in the A1 segment on the right. More distal MCA branches vessels show some atherosclerotic irregularity, more extensive on the right than the left. There may even be a missing branch or 2 on the right in the temporal region. Both vertebral arteries are patent at  the foramen magnum with the left being dominant. Both vertebral arteries reach the basilar. The distal right vertebral artery shows 50% narrowing. There is no basilar stenosis. Superior cerebellar and posterior cerebral arteries are patent, with the right PCA receiving most of it supply from the anterior circulation. More distal branch vessels in the PCA territories showed narrowing and irregularity. IMPRESSION: No significant disease in the neck. Wide patency of the carotid bifurcations and of the proximal vertebral arteries. Intracranial atherosclerotic disease with narrowing and irregularity of the medium and small vessels. Diminished number of MCA branches on the right compared to the left consistent with recent infarction in the right temporal lobe. 50% stenosis of the A1 segment on the right. Fairly considerable atherosclerotic irregularity of the more distal PCA branch vessels bilaterally. Electronically Signed   By: Nelson Chimes M.D.   On: 03/12/2015 11:48   Mr Brain Wo Contrast  03/11/2015  CLINICAL DATA:  Lethargy, nausea and vomiting. Hypertensive. History of hypertension, hyperlipidemia, diabetes. EXAM: MRI HEAD WITHOUT CONTRAST TECHNIQUE: Multiplanar, multiecho pulse sequences of the brain and surrounding structures were obtained without intravenous contrast. COMPARISON:  CT head March 11, 2015 at 1913 hours FINDINGS: Fast protocol used. 1 cm focus of reduced diffusion RIGHT posterior temporal lobe with corresponding low ADC value. The ventricles and sulci are normal for patient's age. No mass lesions, mass effect. Confluent supratentorial and to lesser extent pontine white matter FLAIR T2 hyperintensities. Old RIGHT basal ganglia lacunar infarct. A few scattered peripheral punctate foci of susceptibility artifact can be  seen in setting of chronic hypertension. No lobar hematoma. No abnormal extra-axial fluid collections. No extra-axial masses though, contrast enhanced sequences would be more  sensitive. Normal major intracranial vascular flow voids seen at the skull base. Ocular globes and orbital contents are unremarkable though not tailored for evaluation. No abnormal sellar expansion. No suspicious calvarial bone marrow signal. Craniocervical junction maintained. Visualized paranasal sinuses and mastoid air cells are well-aerated. IMPRESSION: 1 cm acute RIGHT posterior temporal lobe infarct. Severe white matter changes compatible with chronic small vessel ischemic disease. Old RIGHT basal ganglia lacunar infarct. Electronically Signed   By: Elon Alas M.D.   On: 03/11/2015 22:47   Ct Abdomen Pelvis W Contrast  03/11/2015  CLINICAL DATA:  Nausea and vomiting starting this morning at 4 a.m. EXAM: CT ABDOMEN AND PELVIS WITH CONTRAST TECHNIQUE: Multidetector CT imaging of the abdomen and pelvis was performed using the standard protocol following bolus administration of intravenous contrast. CONTRAST:  169mL OMNIPAQUE IOHEXOL 300 MG/ML  SOLN COMPARISON:  05/06/2009 and lumbar spine 12/19/2011 FINDINGS: The lung bases are unremarkable. Sagittal images of the spine shows mild degenerative changes thoracolumbar spine. There is diffuse osteopenia. There is mild compression deformity upper endplate of L1 vertebral body. Schmorl's node deformity noted upper endplate of 624THL vertebral body. The patient is status post cholecystectomy. No focal hepatic mass. Enhanced pancreas, spleen and right adrenal are stable. Stable fat containing lesion in left adrenal benign in nature measures 1.5 cm. Atherosclerotic calcifications of abdominal aorta and iliac arteries. No aortic aneurysm. There is mild right hydronephrosis and proximal right hydroureter. Mild dilatation of the left renal pelvis without frank hydronephrosis. No calcified ureteral calculi are noted bilaterally. There is a cyst in midpole posterior aspect of the right kidney measures 1.3 cm. Delayed renal images shows bilateral renal symmetrical  excretion. Bilateral distal ureter is small caliber. Mild dilatation of mid right ureter. A distal right ureteral stricture cannot be excluded. Correlation with urology exam is recommended. No definite evidence of ureteral mass. Significant distended urinary bladder. The urinary bladder measures at least 19 cm cranial caudally by 8.8 cm AP diameter. There is no small bowel obstruction. No ascites or free air. No adenopathy. Normal appendix. No pericecal inflammation. Again noted a small supraumbilical hernia containing fat without evidence of acute complication. There is some loculated fluid umbilical region superficial measures 1.8 cm. Cellulitis or small umbilical abscess cannot be excluded. Clinical correlation is necessary. There is moderate stool noted in distal sigmoid colon and rectum. No colitis or diverticulitis. No distal colonic obstruction. Few diverticula are noted in descending colon. The patient is status post hysterectomy. IMPRESSION: 1. There is mild right hydronephrosis and proximal right hydroureter. No calcified ureteral calculi are noted bilaterally. Delayed renal images shows bilateral renal symmetrical excretion. Correlation with urology exam is recommended to exclude subtle distal right ureteral stricture. 2. Significant distended urinary bladder. Urinary bladder measures at least 19 cm cranial caudally by 9 cm AP diameter. 3. No small bowel obstruction. 4. Status postcholecystectomy. 5. Stable fat containing lesion in left adrenal gland benign in nature. 6. There is small amount of loculated fluid in the umbilical region please see axial image 46 measures about 1.8 cm. Cellulitis or small abscess cannot be excluded. Clinical correlation is necessary. 7. Stable small supraumbilical hernia containing fat without evidence of acute complication. 8. Status post hysterectomy. 9. Atherosclerotic calcifications of abdominal aorta and iliac arteries. Electronically Signed   By: Lahoma Crocker M.D.   On:  03/11/2015 16:58  Dg Abd Acute W/chest  03/11/2015  CLINICAL DATA:  Left upper quadrant pain, constipation, nausea starting this morning EXAM: DG ABDOMEN ACUTE W/ 1V CHEST COMPARISON:  01/27/2013 FINDINGS: Cardiomediastinal silhouette is stable. No acute infiltrate or pleural effusion. No pulmonary edema. Mild dextroscoliosis lower thoracic spine. Mild levoscoliosis lumbar spine. There is normal small bowel gas pattern. Postcholecystectomy surgical clips are noted. Moderate stool noted in distal sigmoid colon and rectum. No free abdominal air. IMPRESSION: Negative abdominal radiographs. No acute cardiopulmonary disease. No free abdominal air. Postcholecystectomy surgical clips are noted. Thoracolumbar scoliosis. Moderate stool are noted in distal sigmoid colon and rectum. Electronically Signed   By: Lahoma Crocker M.D.   On: 03/11/2015 13:55   Mr Elizabeth Rivera  03/12/2015  CLINICAL DATA:  Lethargy, nausea and vomiting.  Hypertension. EXAM: MRA NECK WITHOUT AND WITH CONTRAST MRA HEAD WITHOUT CONTRAST TECHNIQUE: Multiplanar and multiecho pulse sequences of the neck were obtained without and with intravenous contrast. Angiographic images of the neck were obtained using MRA technique without and with intravenous contast.; Angiographic images of the Circle of Willis were obtained using MRA technique without intravenous contrast. CONTRAST:  38mL MULTIHANCE GADOBENATE DIMEGLUMINE 529 MG/ML IV SOLN COMPARISON:  MRI 03/11/2015 FINDINGS: MRA NECK FINDINGS Branching pattern of the brachiocephalic vessels from the arch is normal. Both common carotid arteries are widely patent to the bifurcation region. Both carotid bifurcations are widely patent without ICA stenosis or irregularity. There is mild narrowing of the proximal external carotid arteries. Both vertebral arteries are widely patent without significant proximal stenosis. Both appear normal through the cervical region. MRA HEAD FINDINGS Both internal carotid arteries are  patent through the skullbase and siphon regions. The anterior and middle cerebral vessels are patent. Vessels show mild irregularity consistent with intracranial atherosclerotic change. There is an approximately 50% stenosis in the A1 segment on the right. More distal MCA branches vessels show some atherosclerotic irregularity, more extensive on the right than the left. There may even be a missing branch or 2 on the right in the temporal region. Both vertebral arteries are patent at the foramen magnum with the left being dominant. Both vertebral arteries reach the basilar. The distal right vertebral artery shows 50% narrowing. There is no basilar stenosis. Superior cerebellar and posterior cerebral arteries are patent, with the right PCA receiving most of it supply from the anterior circulation. More distal branch vessels in the PCA territories showed narrowing and irregularity. IMPRESSION: No significant disease in the neck. Wide patency of the carotid bifurcations and of the proximal vertebral arteries. Intracranial atherosclerotic disease with narrowing and irregularity of the medium and small vessels. Diminished number of MCA branches on the right compared to the left consistent with recent infarction in the right temporal lobe. 50% stenosis of the A1 segment on the right. Fairly considerable atherosclerotic irregularity of the more distal PCA branch vessels bilaterally. Electronically Signed   By: Nelson Chimes M.D.   On: 03/12/2015 11:48     ASSESSMENT AND PLAN:   66 year old female with a history of stroke presented with intractable nausea and vomiting and significantly elevated blood pressure and found to have 1 cm acute RIGHT posterior temporal lobe infarct.  1. 1 cm acute RIGHT posterior temporal lobe infarct: MRA shows no significant carotid stenosis or aneurysm. Echocardiogram shows normal ejection fraction without valvular abnormality. Occupational therapy is recommending skilled nursing  facility. PT is recommending SNF.  Continue Plavix and statin. LDL 160 not at goal.   2. Intractable nausea and vomiting: This  is due to CVA/hypertension and appears positional in nature. resolved  3. Essential hypertension: Increase LISINOPRIL and continue Metoprolol.  Follow BP.   4. Type 2 diabetes without medication: Continue sliding scale insulin and Lantus.  5. Hypothyroid: Continue Synthroid  6. Depression and anxiety: Continue Effexor and Xanax.  7. Bleeding from IV site:this has now stopped. Platelet count is at baseline. Petechia is likely related to blood pressure cuff and possibly antiplatelet therapy. She needs antiplatelet therapy for her stroke.   she has been seen by oncology and surgical services.  Ferritin and iron are normal as well as folate.    Management plans discussed with the patient and she is in agreement.  CODE STATUS: FULL  TOTAL TIME TAKING CARE OF THIS PATIENT: 35 minutes.   D/w family  POSSIBLE D/C today or tomorrow, DEPENDING ON CLINICAL CONDITION.   Jahiem Franzoni M.D on 03/15/2015 at 2:55 PM  Between 7am to 6pm - Pager - [phone] After 6pm go to [email or web address] - password [email or web address] [facility]  Office  [phone]  CC: Primary care physician; Elsie Stain, MD  Note: This dictation was prepared with Dragon dictation along with smaller phrase technology. Any transcriptional errors that result from this process are unintentional.

## 2015-03-15 NOTE — Plan of Care (Signed)
Problem: Safety: Goal: Ability to remain free from injury will improve Outcome: Progressing Chair this pm  Problem: Pain Managment: Goal: General experience of comfort will improve Outcome: Progressing Prn x 2 for  C/o pain with relief  Problem: Skin Integrity: Goal: Risk for impaired skin integrity will decrease Outcome: Progressing Rash rt hand cream ordered . Dressing to be d/cd left arm/  Problem: Bowel/Gastric: Goal: Will not experience complications related to bowel motility Outcome: Progressing To be discharged to [facility] tomorrow 03/16/15

## 2015-03-15 NOTE — Care Management (Signed)
Admitted to [facility] with the diagnosis of stroke. Lives with husband, Herbie [place], [phone]). Last seen Dr. Elsie Stain a week ago. [facility] in the past. (about a year ago). [facility] last April. Briefly in [facility] (didn't like this facility). Uses a cane to aid in ambulation. [facility] about a month ago. Appetite is better. Takes care of basic activities of daily living herself.  Physical therapy evaluation completed. Recommends skilled nursing facility. Preference is [facility]. "Will not go to [facility]." Shelbie Ammons RN MSN [facility] [phone]

## 2015-03-15 NOTE — Progress Notes (Signed)
Family is stating that they do not feel patient ready for discharge. She has not take a bath and is weak. They are worried about IV site bleeding. I discussed that we can take IV out and see if she bleeds but from surgical point, it seems as though it will not bleed Per my conversation with surgeon). Also mentioned to them that she is going to rehab for her weakness/CVA.  They want patient to stay another night to observe her. HOLD DISCHARGE FOR TODAY.

## 2015-03-15 NOTE — Clinical Social Work Note (Signed)
Clinical Social Work Assessment  Patient Details  Name: Elizabeth Rivera MRN: [id number] Date of Birth: [birth year]  Date of referral:  03/15/15               Reason for consult:  Facility Placement                Permission sought to share information with:  Family Supports Permission granted to share information::  Yes, Verbal Permission Granted  Name::     Lezley Bedgood, husband   Housing/Transportation Living arrangements for the past 2 months:  Branch of Information:  Patient Patient Interpreter Needed:  None Criminal Activity/Legal Involvement Pertinent to Current Situation/Hospitalization:  No - Comment as needed Significant Relationships:  Adult Children, Spouse Lives with:  Spouse Do you feel safe going back to the place where you live?  Yes Need for family participation in patient care:  Yes (Comment)  Care giving concerns:  No care giving concerns identified.    Social Worker assessment / plan:  CSW met with pt and family to address consult for New SNF. CSW introduced herself and explained role of social work. CSW also explained discharging to a SNF. Pt has been to [facility] in the past and is agreeable to returning. Pt and family expressed concerns that pt is leaving hospital too soon. CSW updated MD, who spoke with pt. CSW initiated bed search and followed up with offers. Pt chose [facility] CSW udpated facility. CSW will continue to follow.   Employment status:  Retired Nurse, adult PT Recommendations:  [place] / Referral to community resources:  [place]  Patient/Family's Response to care:  Pt and family were appreciative of CSW support.   Patient/Family's Understanding of and Emotional Response to Diagnosis, Current Treatment, and Prognosis:  Pt's family is aware that pt would benefit from a higher level of care.   Emotional Assessment Appearance:  Appears older than  stated age Attitude/Demeanor/Rapport:  Other (Appropriate) Affect (typically observed):  Anxious, Pleasant Orientation:  Oriented to Self, Oriented to Place, Oriented to  Time, Oriented to Situation Alcohol / Substance use:  Never Used Psych involvement (Current and /or in the community):  No (Comment)  Discharge Needs  Concerns to be addressed:  Adjustment to Illness Readmission within the last 30 days:  No Current discharge risk:  Chronically ill Barriers to Discharge:  Continued Medical Work up   [employer], LCSW 03/15/2015, 4:17 PM

## 2015-03-15 NOTE — Progress Notes (Signed)
Pt's blood pressure elevated in evening at 194/87, HR 112, PRN Clonidine 0.1 mg given. Blood pressure improved.

## 2015-03-15 NOTE — Discharge Summary (Deleted)
[facility] [place] NAME: Elizabeth Rivera    MR#:  [id number]  DATE OF BIRTH:  04-Jan-1951  DATE OF ADMISSION:  03/11/2015 ADMITTING PHYSICIAN: Lance Coon, MD  DATE OF DISCHARGE: 03/16/2015  PRIMARY CARE PHYSICIAN: Elsie Stain, MD    ADMISSION DIAGNOSIS:  Stroke [facility]) [I63.9] Intractable vomiting with nausea, vomiting of unspecified type [R11.10]  DISCHARGE DIAGNOSIS:  Principal Problem:   Stroke [facility]) Active Problems:   Hypothyroidism   HYPERLIPIDEMIA, MIXED   GERD   DM (diabetes mellitus), type 2 with neurological complications ([place])   Accelerated hypertension   Intractable nausea and vomiting   Anxiety   Cerebrovascular accident (CVA) due to occlusion of cerebral artery (HCC)   Petechial rash   Venous hemorrhage   Petechiae   Absolute anemia   SECONDARY DIAGNOSIS:   Past Medical History  Diagnosis Date  . Thyroid disease     hypothyroid  . Anxiety   . Hyperlipidemia   . Hypertension   . Other chest pain     chest discomfort  . Obesity   . Neuropathy ([place])   . Other esophagitis     erosive esophagitis  . Other specified gastritis without mention of hemorrhage     erosvie gastritis  . Pain in limb     Right leg pain  . Other specified disorders of adrenal glands 07/2007    adrenal mass via of CT 1.4cm left Adrenal no change(Dr.Cope)   . Other abnormal blood chemistry   . Diverticulosis of colon (without mention of hemorrhage) 05/20/2009    Internal hemms (Dr. Vira Agar)  . Irritable bowel syndrome     history of  . Insomnia, unspecified   . Nervous breakdown 08/2002    [facility]  . GERD (gastroesophageal reflux disease)     reflux HH 09/01/2002//egd reflux Esoph. HH Gastritis nonbleeding erosive gastropathy Duodentitis 08/15/2006  . Hyperglycemia   . Candidal vaginitis   . Forearm fracture     2013, left  . Anemia   . Anemia of other chronic disease 09/01/2013    HOSPITAL COURSE:    65 year old female with a history of stroke presented with intractable nausea and vomiting and significantly elevated blood pressure and found to have 1 cm acute RIGHT posterior temporal lobe infarct.  1. 1 cm acute RIGHT posterior temporal lobe infarct: MRA showedno significant carotid stenosis or aneurysm. Echocardiogram shows normal ejection fraction without valvular abnormality. Occupational therapy and PT are recommending skilled nursing facility.  Continue Plavix and statin. LDL 160 not at goal.   2. Intractable nausea and vomiting: This is due to CVA/hypertension and appears positional in nature. This has improved.   3. Essential hypertension:I increased lisinopril for better BP and she will remain on current dose of metoprolol. GOLA BP is <130/80.  4. Type 2 diabetes without medication: Continue ADA diet and Lantus.  5. Hypothyroid: Continue Synthroid  6. Depression and anxiety: Continue Effexor and Xanax.  7. Bleeding from IV site:this has now stopped. She does have petechia on her hands. Platelet count is at baseline. Petechia is  related to blood pressure cuff and possibly antiplatelet therapy. She needs antiplatelet therapy for her stroke.  She was  Evaluated by oncology and surgical services.  Ferritin and iron are normal as well as folate.  She will follow up with ONCOLOGY next week  DISCHARGE CONDITIONS AND DIET:   Stable Heart healthy diabetic diet  CONSULTS OBTAINED:  Treatment Team:  Catarina Hartshorn, MD Lenna Sciara  Elmarie Mainland, MD  DRUG ALLERGIES:   Allergies  Allergen Reactions  . Sulfonamide Derivatives     REACTION: rash  . Sulfa Antibiotics Rash    DISCHARGE MEDICATIONS:   Current Discharge Medication List    START taking these medications   Details  atorvastatin (LIPITOR) 40 MG tablet Take 1 tablet (40 mg total) by mouth daily at 6 PM. Qty: 30 tablet, Refills: 0    clopidogrel (PLAVIX) 75 MG tablet Take 1 tablet (75 mg total) by mouth  daily. Qty: 30 tablet, Refills: 0    feeding supplement, GLUCERNA SHAKE, (GLUCERNA SHAKE) LIQD Take 237 mLs by mouth 2 (two) times daily between meals. Qty: 30 Can, Refills: 0    ondansetron (ZOFRAN ODT) 4 MG disintegrating tablet Take 1 tablet (4 mg total) by mouth every 8 (eight) hours as needed for nausea or vomiting. Qty: 20 tablet, Refills: 0      CONTINUE these medications which have CHANGED   Details  ALPRAZolam (XANAX) 0.25 MG tablet Take 1 tablet (0.25 mg total) by mouth 2 (two) times daily. Qty: 30 tablet, Refills: 0    lisinopril (PRINIVIL,ZESTRIL) 40 MG tablet Take 1 tablet (40 mg total) by mouth daily. Qty: 30 tablet, Refills: 0      CONTINUE these medications which have NOT CHANGED   Details  Blood Glucose Monitoring Suppl (ONE TOUCH ULTRA 2) W/DEVICE KIT Use to test blood sugar twice daily and as directed.  Diagnosis:  E11.40  Insulin-dependent. Qty: 1 each, Refills: 0    cholecalciferol (VITAMIN D) 1000 units tablet Take 1,000 Units by mouth daily.    ferrous sulfate 325 (65 FE) MG tablet Take 1 tablet (325 mg total) by mouth daily with breakfast.    glucose blood (ONE TOUCH ULTRA TEST) test strip Use as instructed to test blood sugar twice daily and as directed.  Diagnosis:  E11.40  Insulin dependent. Qty: 200 each, Refills: 3    Insulin Glargine (LANTUS SOLOSTAR) 100 UNIT/ML Solostar Pen Inject 9 Units into the skin daily at 10 pm. If sugar 100-130, no change.  Add 1 unit if AM sugar >130. Dec 1 unit if <100. Qty: 15 mL    Lancets (ONETOUCH ULTRASOFT) lancets Use as instructed to test blood sugar twice daily and as directed.  Diagnosis:  E11.40   Insulin-dependent. Qty: 200 each, Refills: 3    levothyroxine (SYNTHROID, LEVOTHROID) 125 MCG tablet Take 1 tablet (125 mcg total) by mouth daily before breakfast. Qty: 90 tablet, Refills: 3   Associated Diagnoses: Hypothyroidism, unspecified hypothyroidism type    metFORMIN (GLUCOPHAGE) 1000 MG tablet Take 0.5  tablets (500 mg total) by mouth 2 (two) times daily with a meal. Qty: 90 tablet, Refills: 3    metoprolol succinate (TOPROL XL) 100 MG 24 hr tablet Take 1 tablet (100 mg total) by mouth daily. Qty: 30 tablet, Refills: 3   Associated Diagnoses: Syncope, unspecified syncope type    omeprazole (PRILOSEC) 40 MG capsule Take 1 capsule (40 mg total) by mouth 2 (two) times daily. Qty: 180 capsule, Refills: 3    pregabalin (LYRICA) 75 MG capsule 2 tabs in AM and 1 in PM. Qty: 270 capsule, Refills: 1    ranitidine (ZANTAC) 300 MG tablet Take 1 tablet (300 mg total) by mouth daily. Qty: 90 tablet, Refills: 3    venlafaxine XR (EFFEXOR-XR) 150 MG 24 hr capsule Take 1 capsule (150 mg total) by mouth 2 (two) times daily. Qty: 180 capsule, Refills: 3    vitamin B-12 (  CYANOCOBALAMIN) 1000 MCG tablet Take 1,000 mcg by mouth 2 (two) times daily.        STOP taking these medications     aspirin EC 81 MG tablet               Today   CHIEF COMPLAINT:  No nausea doing better this am + HA   VITAL SIGNS:  Blood pressure 162/76, pulse 63, temperature 97.9 F (36.6 C), temperature source Oral, resp. rate 16, height 5' (1.524 m), weight 61.689 kg (136 lb), SpO2 98 %.   REVIEW OF SYSTEMS:  Review of Systems  Constitutional: Negative for fever, chills and malaise/fatigue.  HENT: Negative for sore throat.   Eyes: Negative for blurred vision.  Respiratory: Negative for cough, hemoptysis, shortness of breath and wheezing.   Cardiovascular: Negative for chest pain, palpitations and leg swelling.  Gastrointestinal: Negative for nausea, vomiting, abdominal pain, diarrhea and blood in stool.  Genitourinary: Negative for dysuria.  Musculoskeletal: Negative for back pain.  Neurological: Negative for dizziness, tremors and headaches.  Endo/Heme/Allergies: Does not bruise/bleed easily.     PHYSICAL EXAMINATION:  GENERAL:  65 y.o.-year-old patient lying in the bed with no acute distress.   NECK:  Supple, no jugular venous distention. No thyroid enlargement, no tenderness.  LUNGS: Normal breath sounds bilaterally, no wheezing, rales,rhonchi  No use of accessory muscles of respiration.  CARDIOVASCULAR: S1, S2 normal. No murmurs, rubs, or gallops.  ABDOMEN: Soft, non-tender, non-distended. Bowel sounds present. No organomegaly or mass.  EXTREMITIES: No pedal edema, cyanosis, or clubbing.  PSYCHIATRIC: The patient is alert and oriented x 3.  SKIN: No obvious rash, lesion, or ulcer.  NEURO right weakness very minimal and right facial droop slight DATA REVIEW:   CBC  Recent Labs Lab 03/14/15 0754  WBC 8.0  HGB 11.7*  HCT 35.8  PLT 183    Chemistries   Recent Labs Lab 03/11/15 1227  03/14/15 0754  NA 137  < > 142  K 3.3*  < > 3.6  CL 101  < > 106  CO2 27  < > 26  GLUCOSE 255*  < > 222*  BUN 15  < > 21*  CREATININE 0.61  < > 0.94  CALCIUM 9.3  < > 9.8  AST 16  --   --   ALT 14  --   --   ALKPHOS 76  --   --   BILITOT 0.7  --   --   < > = values in this interval not displayed.  Cardiac Enzymes  Recent Labs Lab 03/11/15 1227  TROPONINI <0.03    Microbiology Results  _0 @  RADIOLOGY:  No results found.    Management plans discussed with the patient and she is in agreement. Stable for discharge   Patient should follow up with PCP in 1 week  CODE STATUS:     Code Status Orders        Start     Ordered   03/11/15 2337  Full code   Continuous     03/11/15 2336    Code Status History    Date Active Date Inactive Code Status Order ID Comments User Context   This patient has a current code status but no historical code status.      TOTAL TIME TAKING CARE OF THIS PATIENT: 35 minutes.    Note: This dictation was prepared with Dragon dictation along with smaller phrase technology. Any transcriptional errors that result from this process are unintentional.  Jawaun Celmer M.D on 03/16/2015 at 9:22 AM  Between 7am to 6pm - Pager -  [phone] After 6pm go to [email or web address] - password [email or web address] [place] [facility]  Office  [phone]  CC: Primary care physician; Elsie Stain, MD

## 2015-03-15 NOTE — Care Management Important Message (Signed)
Important Message  Patient Details  Name: Elizabeth Rivera MRN: [id number] Date of Birth: 1950-11-06   Medicare Important Message Given:  Yes    Juliann Pulse A Ireta Pullman 03/15/2015, 1:22 PM

## 2015-03-15 NOTE — NC FL2 (Signed)
[place] LEVEL OF CARE SCREENING TOOL     IDENTIFICATION  Patient Name: Elizabeth Rivera Birthdate: January 02, 1951 Sex: female Admission Date (Current Location): 03/11/2015  [place] and [place] Number:  Engineering geologist and Address:  [facility], [address], [place], [place] [address]      Provider Number: [id number]  Attending Physician Name and Address:  Bettey Costa, MD  Relative Name and Phone Number:       Current Level of Care: Hospital Recommended Level of Care: [place] Prior Approval Number:    Date Approved/Denied:   PASRR Number: [id number]  Discharge Plan: SNF    Current Diagnoses: Patient Active Problem List   Diagnosis Date Noted  . Cerebrovascular accident (CVA) due to occlusion of cerebral artery ([place])   . Petechial rash   . Venous hemorrhage   . Petechiae   . Absolute anemia   . Accelerated hypertension 03/11/2015  . Intractable nausea and vomiting 03/11/2015  . Anxiety 03/11/2015  . Stroke ([place]) 03/11/2015  . Pinworm infection 01/10/2015  . Hand pain 09/09/2014  . Psychomotor retardation 05/25/2014  . Syncope, cardiogenic 05/05/2014  . Tremor 05/05/2014  . Multiple falls 11/28/2013  . Elevated immunoglobulin A 09/02/2013  . Anemia of other chronic disease 09/01/2013  . Barrett's esophagus 08/18/2013  . Other malaise and fatigue 07/29/2013  . Diarrhea 03/18/2013  . DM (diabetes mellitus), type 2 with neurological complications ([place]) [id number]  . Bilateral leg pain 07/02/2012  . Urinary incontinence 09/25/2011  . Diaphoresis 12/21/2010  . Leg edema 05/11/2010  . B12 deficiency 05/11/2010  . BENIGN POSITIONAL VERTIGO 01/06/2009  . ABDOMINAL PAIN, LEFT UPPER QUADRANT 12/10/2008  . UNSPECIFIED VITAMIN D DEFICIENCY 09/07/2008  . ROSACEA 09/07/2008  . NEUROPATHY 08/24/2008  . Other chest pain 05/19/2008  . EROSIVE ESOPHAGITIS 02/03/2008  . EROSIVE GASTRITIS 02/03/2008  . Anxiety state  02/14/2007  . OBESITY 11/15/2006  . Extremity pain 09/28/2006  . ADRENAL MASS VIA CT 07/07/2006  . Hypothyroidism 07/05/2006  . HYPERLIPIDEMIA, MIXED 07/05/2006  . Essential hypertension 07/05/2006  . GERD 07/05/2006  . DIVERTICULOSIS, COLON 07/05/2006  . IBS 07/05/2006  . INSOMNIA 07/05/2006    Orientation RESPIRATION BLADDER Height & Weight     Self, Time, Situation, Place  Normal Continent Weight: 136 lb (61.689 kg) Height:  5' (152.4 cm)  BEHAVIORAL SYMPTOMS/MOOD NEUROLOGICAL BOWEL NUTRITION STATUS      Continent Diet (Heart Health/Carb Modified, Thin Liquids)  AMBULATORY STATUS COMMUNICATION OF NEEDS Skin   Limited Assist Verbally Normal                       Personal Care Assistance Level of Assistance  Bathing, Feeding, Dressing Bathing Assistance: Limited assistance Feeding assistance: Independent Dressing Assistance: Limited assistance     Functional Limitations Info  Sight, Hearing, Speech Sight Info: Adequate Hearing Info: Adequate Speech Info: Adequate    SPECIAL CARE FACTORS FREQUENCY  PT (By licensed PT), OT (By licensed OT), Speech therapy     PT Frequency: 5 OT Frequency: 5     Speech Therapy Frequency: 5      Contractures      Additional Factors Info  Code Status, Allergies, Psychotropic Code Status Info: Full Code Allergies Info: Allergies: Sulfonamide Derivatives, Sulfa Antibiotics Psychotropic Info: Medications         Current Medications (03/15/2015):  This is the current hospital active medication list Current Facility-Administered Medications  Medication Dose Route Frequency Provider Last Rate Last  Dose  . acetaminophen (TYLENOL) tablet 650 mg  650 mg Oral Q6H PRN Lance Coon, MD   650 mg at 03/14/15 2009   Or  . acetaminophen (TYLENOL) suppository 650 mg  650 mg Rectal Q6H PRN Lance Coon, MD      . ALPRAZolam Duanne Moron) tablet 0.25 mg  0.25 mg Oral BID Lance Coon, MD   0.25 mg at 03/15/15 1117  . atorvastatin (LIPITOR)  tablet 40 mg  40 mg Oral q1800 Hillary Bow, MD   40 mg at 03/14/15 1715  . bisacodyl (DULCOLAX) suppository 10 mg  10 mg Rectal Daily PRN Lance Coon, MD      . cloNIDine (CATAPRES) tablet 0.1 mg  0.1 mg Oral Q6H PRN Idelle Crouch, MD   0.1 mg at 03/14/15 2041  . clopidogrel (PLAVIX) tablet 75 mg  75 mg Oral Daily Alexis Goodell, MD   75 mg at 03/15/15 1117  . enoxaparin (LOVENOX) injection 40 mg  40 mg Subcutaneous QHS Lance Coon, MD   40 mg at 03/14/15 2009  . feeding supplement (GLUCERNA SHAKE) (GLUCERNA SHAKE) liquid 237 mL  237 mL Oral BID BM Srikar Sudini, MD   237 mL at 03/15/15 1000  . HYDROcodone-acetaminophen (NORCO/VICODIN) 5-325 MG per tablet 1 tablet  1 tablet Oral Q4H PRN Idelle Crouch, MD   1 tablet at 03/15/15 0709  . insulin aspart (novoLOG) injection 0-9 Units  0-9 Units Subcutaneous Q6H Lance Coon, MD   3 Units at 03/15/15 1245  . insulin glargine (LANTUS) injection 8 Units  8 Units Subcutaneous QHS Hillary Bow, MD   8 Units at 03/15/15 0104  . labetalol (NORMODYNE,TRANDATE) injection 10 mg  10 mg Intravenous Q2H PRN Lance Coon, MD   10 mg at 03/14/15 0543  . levothyroxine (SYNTHROID, LEVOTHROID) tablet 125 mcg  125 mcg Oral QAC breakfast Lance Coon, MD   125 mcg at 03/15/15 1116  . lisinopril (PRINIVIL,ZESTRIL) tablet 20 mg  20 mg Oral Daily Bettey Costa, MD   20 mg at 03/15/15 1117  . metoprolol succinate (TOPROL-XL) 24 hr tablet 100 mg  100 mg Oral Daily Sital Mody, MD   100 mg at 03/15/15 1245  . ondansetron (ZOFRAN) tablet 4 mg  4 mg Oral Q6H PRN Lance Coon, MD   4 mg at 03/14/15 0816   Or  . ondansetron [facility]) injection 4 mg  4 mg Intravenous Q6H PRN Lance Coon, MD   4 mg at 03/12/15 1630  . pantoprazole (PROTONIX) EC tablet 40 mg  40 mg Oral QAC breakfast Lance Coon, MD   40 mg at 03/15/15 1116  . promethazine (PHENERGAN) injection 12.5-25 mg  12.5-25 mg Intravenous Q6H PRN Lance Coon, MD   12.5 mg at 03/12/15 0039  . sodium chloride flush (NS)  0.9 % injection 3 mL  3 mL Intravenous Q12H Lance Coon, MD   3 mL at 03/15/15 1127  . venlafaxine XR (EFFEXOR-XR) 24 hr capsule 150 mg  150 mg Oral BID Lance Coon, MD   150 mg at 03/15/15 1117     Discharge Medications: Please see discharge summary for a list of discharge medications.  Relevant Imaging Results:  Relevant Lab Results:   Additional Information SSN:  [id number]  Darden Dates, LCSW

## 2015-03-16 ENCOUNTER — Encounter
Admission: RE | Admit: 2015-03-16 | Discharge: 2015-03-16 | Disposition: A | Discharge status: Home / Self Care | Payer: PPO | Source: Ambulatory Visit | Attending: Internal Medicine | Admitting: Internal Medicine

## 2015-03-16 DIAGNOSIS — Z794 Long term (current) use of insulin: Secondary | ICD-10-CM | POA: Insufficient documentation

## 2015-03-16 DIAGNOSIS — E119 Type 2 diabetes mellitus without complications: Secondary | ICD-10-CM | POA: Diagnosis not present

## 2015-03-16 LAB — LUPUS ANTICOAGULANT PANEL
DRVVT: 35.6 s (ref 0.0–44.0)
PTT Lupus Anticoagulant: 27.3 s (ref 0.0–40.6)

## 2015-03-16 LAB — GLUCOSE, CAPILLARY
GLUCOSE-CAPILLARY: 130 mg/dL — AB (ref 65–99)
GLUCOSE-CAPILLARY: 365 mg/dL — AB (ref 65–99)
Glucose-Capillary: 131 mg/dL — ABNORMAL HIGH (ref 65–99)

## 2015-03-16 LAB — CARDIOLIPIN ANTIBODIES, IGG, IGM, IGA
Anticardiolipin IgA: <9 APL U/mL (ref 0–11)
Anticardiolipin IgG: <9 GPL U/mL (ref 0–14)
Anticardiolipin IgM: <9 MPL U/mL (ref 0–12)

## 2015-03-16 MED ORDER — LISINOPRIL 40 MG PO TABS: 40.0000 mg | ORAL_TABLET | Freq: Every day | ORAL | Status: DC

## 2015-03-16 MED ORDER — HYDROCHLOROTHIAZIDE 25 MG PO TABS
25.0000 mg | ORAL_TABLET | Freq: Every day | ORAL | Status: DC
  Administered 2015-03-16: 10:00:00 25 mg via ORAL
  Filled 2015-03-16: qty 1

## 2015-03-16 MED ORDER — LISINOPRIL 20 MG PO TABS
40.0000 mg | ORAL_TABLET | Freq: Every day | ORAL | Status: DC
  Administered 2015-03-16: 40 mg via ORAL
  Filled 2015-03-16: qty 2

## 2015-03-16 NOTE — Discharge Summary (Signed)
[facility] [place] NAME: Elizabeth Rivera    MR#:  [id number]  DATE OF BIRTH:  04-Jan-1951  DATE OF ADMISSION:  03/11/2015 ADMITTING PHYSICIAN: Lance Coon, MD  DATE OF DISCHARGE: 03/16/2015  PRIMARY CARE PHYSICIAN: Elsie Stain, MD    ADMISSION DIAGNOSIS:  Stroke [facility]) [I63.9] Intractable vomiting with nausea, vomiting of unspecified type [R11.10]  DISCHARGE DIAGNOSIS:  Principal Problem:   Stroke [facility]) Active Problems:   Hypothyroidism   HYPERLIPIDEMIA, MIXED   GERD   DM (diabetes mellitus), type 2 with neurological complications ([place])   Accelerated hypertension   Intractable nausea and vomiting   Anxiety   Cerebrovascular accident (CVA) due to occlusion of cerebral artery (HCC)   Petechial rash   Venous hemorrhage   Petechiae   Absolute anemia   SECONDARY DIAGNOSIS:   Past Medical History  Diagnosis Date  . Thyroid disease     hypothyroid  . Anxiety   . Hyperlipidemia   . Hypertension   . Other chest pain     chest discomfort  . Obesity   . Neuropathy ([place])   . Other esophagitis     erosive esophagitis  . Other specified gastritis without mention of hemorrhage     erosvie gastritis  . Pain in limb     Right leg pain  . Other specified disorders of adrenal glands 07/2007    adrenal mass via of CT 1.4cm left Adrenal no change(Dr.Cope)   . Other abnormal blood chemistry   . Diverticulosis of colon (without mention of hemorrhage) 05/20/2009    Internal hemms (Dr. Vira Agar)  . Irritable bowel syndrome     history of  . Insomnia, unspecified   . Nervous breakdown 08/2002    [facility]  . GERD (gastroesophageal reflux disease)     reflux HH 09/01/2002//egd reflux Esoph. HH Gastritis nonbleeding erosive gastropathy Duodentitis 08/15/2006  . Hyperglycemia   . Candidal vaginitis   . Forearm fracture     2013, left  . Anemia   . Anemia of other chronic disease 09/01/2013    HOSPITAL COURSE:    65 year old female with a history of stroke presented with intractable nausea and vomiting and significantly elevated blood pressure and found to have 1 cm acute RIGHT posterior temporal lobe infarct.  1. 1 cm acute RIGHT posterior temporal lobe infarct: MRA showedno significant carotid stenosis or aneurysm. Echocardiogram shows normal ejection fraction without valvular abnormality. Occupational therapy and PT are recommending skilled nursing facility.  Continue Plavix and statin. LDL 160 not at goal.   2. Intractable nausea and vomiting: This is due to CVA/hypertension and appears positional in nature. This has improved.   3. Essential hypertension:I increased lisinopril for better BP and she will remain on current dose of metoprolol. GOLA BP is <130/80.  4. Type 2 diabetes without medication: Continue ADA diet and Lantus.  5. Hypothyroid: Continue Synthroid  6. Depression and anxiety: Continue Effexor and Xanax.  7. Bleeding from IV site:this has now stopped. She does have petechia on her hands. Platelet count is at baseline. Petechia is  related to blood pressure cuff and possibly antiplatelet therapy. She needs antiplatelet therapy for her stroke.  She was  Evaluated by oncology and surgical services.  Ferritin and iron are normal as well as folate.  She will follow up with ONCOLOGY next week  DISCHARGE CONDITIONS AND DIET:   Stable Heart healthy diabetic diet  CONSULTS OBTAINED:  Treatment Team:  Catarina Hartshorn, MD Lenna Sciara  Elmarie Mainland, MD  DRUG ALLERGIES:   Allergies  Allergen Reactions  . Sulfonamide Derivatives     REACTION: rash  . Sulfa Antibiotics Rash    DISCHARGE MEDICATIONS:   Current Discharge Medication List    START taking these medications   Details  atorvastatin (LIPITOR) 40 MG tablet Take 1 tablet (40 mg total) by mouth daily at 6 PM. Qty: 30 tablet, Refills: 0    clopidogrel (PLAVIX) 75 MG tablet Take 1 tablet (75 mg total) by mouth  daily. Qty: 30 tablet, Refills: 0    feeding supplement, GLUCERNA SHAKE, (GLUCERNA SHAKE) LIQD Take 237 mLs by mouth 2 (two) times daily between meals. Qty: 30 Can, Refills: 0    ondansetron (ZOFRAN ODT) 4 MG disintegrating tablet Take 1 tablet (4 mg total) by mouth every 8 (eight) hours as needed for nausea or vomiting. Qty: 20 tablet, Refills: 0      CONTINUE these medications which have CHANGED   Details  ALPRAZolam (XANAX) 0.25 MG tablet Take 1 tablet (0.25 mg total) by mouth 2 (two) times daily. Qty: 30 tablet, Refills: 0    lisinopril (PRINIVIL,ZESTRIL) 40 MG tablet Take 1 tablet (40 mg total) by mouth daily. Qty: 30 tablet, Refills: 0      CONTINUE these medications which have NOT CHANGED   Details  Blood Glucose Monitoring Suppl (ONE TOUCH ULTRA 2) W/DEVICE KIT Use to test blood sugar twice daily and as directed.  Diagnosis:  E11.40  Insulin-dependent. Qty: 1 each, Refills: 0    cholecalciferol (VITAMIN D) 1000 units tablet Take 1,000 Units by mouth daily.    ferrous sulfate 325 (65 FE) MG tablet Take 1 tablet (325 mg total) by mouth daily with breakfast.    glucose blood (ONE TOUCH ULTRA TEST) test strip Use as instructed to test blood sugar twice daily and as directed.  Diagnosis:  E11.40  Insulin dependent. Qty: 200 each, Refills: 3    Insulin Glargine (LANTUS SOLOSTAR) 100 UNIT/ML Solostar Pen Inject 9 Units into the skin daily at 10 pm. If sugar 100-130, no change.  Add 1 unit if AM sugar >130. Dec 1 unit if <100. Qty: 15 mL    Lancets (ONETOUCH ULTRASOFT) lancets Use as instructed to test blood sugar twice daily and as directed.  Diagnosis:  E11.40   Insulin-dependent. Qty: 200 each, Refills: 3    levothyroxine (SYNTHROID, LEVOTHROID) 125 MCG tablet Take 1 tablet (125 mcg total) by mouth daily before breakfast. Qty: 90 tablet, Refills: 3   Associated Diagnoses: Hypothyroidism, unspecified hypothyroidism type    metFORMIN (GLUCOPHAGE) 1000 MG tablet Take 0.5  tablets (500 mg total) by mouth 2 (two) times daily with a meal. Qty: 90 tablet, Refills: 3    metoprolol succinate (TOPROL XL) 100 MG 24 hr tablet Take 1 tablet (100 mg total) by mouth daily. Qty: 30 tablet, Refills: 3   Associated Diagnoses: Syncope, unspecified syncope type    omeprazole (PRILOSEC) 40 MG capsule Take 1 capsule (40 mg total) by mouth 2 (two) times daily. Qty: 180 capsule, Refills: 3    pregabalin (LYRICA) 75 MG capsule 2 tabs in AM and 1 in PM. Qty: 270 capsule, Refills: 1    ranitidine (ZANTAC) 300 MG tablet Take 1 tablet (300 mg total) by mouth daily. Qty: 90 tablet, Refills: 3    venlafaxine XR (EFFEXOR-XR) 150 MG 24 hr capsule Take 1 capsule (150 mg total) by mouth 2 (two) times daily. Qty: 180 capsule, Refills: 3    vitamin B-12 (  CYANOCOBALAMIN) 1000 MCG tablet Take 1,000 mcg by mouth 2 (two) times daily.        STOP taking these medications     aspirin EC 81 MG tablet               Today   CHIEF COMPLAINT:  No nausea doing better this am + HA   VITAL SIGNS:  Blood pressure 162/76, pulse 63, temperature 97.9 F (36.6 C), temperature source Oral, resp. rate 16, height 5' (1.524 m), weight 61.689 kg (136 lb), SpO2 98 %.   REVIEW OF SYSTEMS:  Review of Systems  Constitutional: Negative for fever, chills and malaise/fatigue.  HENT: Negative for sore throat.   Eyes: Negative for blurred vision.  Respiratory: Negative for cough, hemoptysis, shortness of breath and wheezing.   Cardiovascular: Negative for chest pain, palpitations and leg swelling.  Gastrointestinal: Negative for nausea, vomiting, abdominal pain, diarrhea and blood in stool.  Genitourinary: Negative for dysuria.  Musculoskeletal: Negative for back pain.  Neurological: Negative for dizziness, tremors and headaches.  Endo/Heme/Allergies: Does not bruise/bleed easily.     PHYSICAL EXAMINATION:  GENERAL:  65 y.o.-year-old patient lying in the bed with no acute distress.   NECK:  Supple, no jugular venous distention. No thyroid enlargement, no tenderness.  LUNGS: Normal breath sounds bilaterally, no wheezing, rales,rhonchi  No use of accessory muscles of respiration.  CARDIOVASCULAR: S1, S2 normal. No murmurs, rubs, or gallops.  ABDOMEN: Soft, non-tender, non-distended. Bowel sounds present. No organomegaly or mass.  EXTREMITIES: No pedal edema, cyanosis, or clubbing.  PSYCHIATRIC: The patient is alert and oriented x 3.  SKIN: No obvious rash, lesion, or ulcer.  NEURO right weakness very minimal and right facial droop slight DATA REVIEW:   CBC  Recent Labs Lab 03/14/15 0754  WBC 8.0  HGB 11.7*  HCT 35.8  PLT 183    Chemistries   Recent Labs Lab 03/11/15 1227  03/14/15 0754  NA 137  < > 142  K 3.3*  < > 3.6  CL 101  < > 106  CO2 27  < > 26  GLUCOSE 255*  < > 222*  BUN 15  < > 21*  CREATININE 0.61  < > 0.94  CALCIUM 9.3  < > 9.8  AST 16  --   --   ALT 14  --   --   ALKPHOS 76  --   --   BILITOT 0.7  --   --   < > = values in this interval not displayed.  Cardiac Enzymes  Recent Labs Lab 03/11/15 1227  TROPONINI <0.03    Microbiology Results  _0 @  RADIOLOGY:  No results found.    Management plans discussed with the patient and she is in agreement. Stable for discharge   Patient should follow up with PCP in 1 week  CODE STATUS:     Code Status Orders        Start     Ordered   03/11/15 2337  Full code   Continuous     03/11/15 2336    Code Status History    Date Active Date Inactive Code Status Order ID Comments User Context   This patient has a current code status but no historical code status.      TOTAL TIME TAKING CARE OF THIS PATIENT: 35 minutes.    Note: This dictation was prepared with Dragon dictation along with smaller phrase technology. Any transcriptional errors that result from this process are unintentional.  Shavette Shoaff M.D on 03/16/2015 at 9:24 AM  Between 7am to 6pm - Pager -  [phone] After 6pm go to [email or web address] - password [email or web address] [facility]  Office  [phone]  CC: Primary care physician; Elsie Stain, MD

## 2015-03-16 NOTE — Progress Notes (Signed)
Pt transported to [facility] place at this time. Alert. No resp distress.  Sl d/cd earlier w/o difficulty.or s/s bleeding.   Report called to [place] at [facility].

## 2015-03-17 LAB — GLUCOSE, CAPILLARY
GLUCOSE-CAPILLARY: 324 mg/dL — AB (ref 65–99)
Glucose-Capillary: 263 mg/dL — ABNORMAL HIGH (ref 65–99)
Glucose-Capillary: 333 mg/dL — ABNORMAL HIGH (ref 65–99)
Glucose-Capillary: 321 mg/dL — ABNORMAL HIGH (ref 65–99)
Glucose-Capillary: 396 mg/dL — ABNORMAL HIGH (ref 65–99)

## 2015-03-17 NOTE — Clinical Social Work Note (Signed)
Pt is ready for discharge today to [facility]. [employer]aufmann has been obtained. Pt and family are aware and agreeable to discharge plan. Facility is ready to admit pt as they have received discharge information. RN to call report and EMS will provide transportation. CSW is signing off as no further needs identified.   Darden Dates, MSW, [place] Social Worker  [phone]

## 2015-03-17 NOTE — Clinical Social Work Placement (Signed)
   CLINICAL SOCIAL WORK PLACEMENT  NOTE  Date:  03/17/2015  Patient Details  Name: Elizabeth Rivera MRN: [id number] Date of Birth: [birth year]  Clinical Social Work is seeking post-discharge placement for this patient at the [place] level of care (*CSW will initial, date and re-position this form in  chart as items are completed):  Yes   Patient/family provided with [place] Work Department's list of facilities offering this level of care within the geographic area requested by the patient (or if unable, by the patient's family).  Yes   Patient/family informed of their freedom to choose among providers that offer the needed level of care, that participate in Medicare, Medicaid or managed care program needed by the patient, have an available bed and are willing to accept the patient.  Yes   Patient/family informed of [place]'s ownership interest in [facility] and [facility], as well as of the fact that they are under no obligation to receive care at these facilities.  PASRR submitted to EDS on       PASRR number received on       Existing PASRR number confirmed on 03/15/15     FL2 transmitted to all facilities in geographic area requested by pt/family on 03/15/15     FL2 transmitted to all facilities within larger geographic area on       Patient informed that his/her managed care company has contracts with or will negotiate with certain facilities, including the following:        Yes   Patient/family informed of bed offers received.  Patient chooses bed at [facility]     Physician recommends and patient chooses bed at  [facility])    Patient to be transferred to [facility] on 03/16/15.  Patient to be transferred to facility by [facility]     Patient family notified on 03/16/15 of transfer.  Name of family member notified:  Daughter, Magda Paganini and husband, Herbie [place]     PHYSICIAN       Additional Comment:     _______________________________________________ Darden Dates, LCSW 03/17/2015, 11:43 AM

## 2015-03-18 DIAGNOSIS — Z794 Long term (current) use of insulin: Secondary | ICD-10-CM | POA: Diagnosis present

## 2015-03-18 DIAGNOSIS — E119 Type 2 diabetes mellitus without complications: Secondary | ICD-10-CM | POA: Diagnosis present

## 2015-03-18 LAB — GLUCOSE, CAPILLARY
GLUCOSE-CAPILLARY: 161 mg/dL — AB (ref 65–99)
GLUCOSE-CAPILLARY: 308 mg/dL — AB (ref 65–99)
GLUCOSE-CAPILLARY: 216 mg/dL — AB (ref 65–99)
GLUCOSE-CAPILLARY: 277 mg/dL — AB (ref 65–99)

## 2015-03-19 ENCOUNTER — Telehealth: Payer: Self-pay

## 2015-03-19 DIAGNOSIS — E119 Type 2 diabetes mellitus without complications: Secondary | ICD-10-CM | POA: Diagnosis not present

## 2015-03-19 NOTE — Telephone Encounter (Signed)
Initial call - left voicemail msg

## 2015-03-21 LAB — GLUCOSE, CAPILLARY
GLUCOSE-CAPILLARY: 164 mg/dL — AB (ref 65–99)
GLUCOSE-CAPILLARY: 265 mg/dL — AB (ref 65–99)
GLUCOSE-CAPILLARY: 237 mg/dL — AB (ref 65–99)
GLUCOSE-CAPILLARY: 220 mg/dL — AB (ref 65–99)
GLUCOSE-CAPILLARY: 154 mg/dL — AB (ref 65–99)
GLUCOSE-CAPILLARY: 155 mg/dL — AB (ref 65–99)
GLUCOSE-CAPILLARY: 81 mg/dL (ref 65–99)
Glucose-Capillary: 173 mg/dL — ABNORMAL HIGH (ref 65–99)
Glucose-Capillary: 187 mg/dL — ABNORMAL HIGH (ref 65–99)
Glucose-Capillary: 140 mg/dL — ABNORMAL HIGH (ref 65–99)
Glucose-Capillary: 150 mg/dL — ABNORMAL HIGH (ref 65–99)
Glucose-Capillary: 213 mg/dL — ABNORMAL HIGH (ref 65–99)

## 2015-03-22 DIAGNOSIS — E119 Type 2 diabetes mellitus without complications: Secondary | ICD-10-CM | POA: Diagnosis not present

## 2015-03-22 LAB — GLUCOSE, CAPILLARY
GLUCOSE-CAPILLARY: 158 mg/dL — AB (ref 65–99)
GLUCOSE-CAPILLARY: 155 mg/dL — AB (ref 65–99)
GLUCOSE-CAPILLARY: 235 mg/dL — AB (ref 65–99)
Glucose-Capillary: 159 mg/dL — ABNORMAL HIGH (ref 65–99)
Glucose-Capillary: 275 mg/dL — ABNORMAL HIGH (ref 65–99)

## 2015-03-22 NOTE — Telephone Encounter (Signed)
Second TCM call - left voicemail message with contact info.

## 2015-03-23 ENCOUNTER — Ambulatory Visit: Payer: PPO | Admitting: Family Medicine

## 2015-03-23 ENCOUNTER — Inpatient Hospital Stay: Payer: PPO | Admitting: Hematology and Oncology

## 2015-03-23 DIAGNOSIS — E119 Type 2 diabetes mellitus without complications: Secondary | ICD-10-CM | POA: Diagnosis not present

## 2015-03-23 LAB — GLUCOSE, CAPILLARY
GLUCOSE-CAPILLARY: 130 mg/dL — AB (ref 65–99)
GLUCOSE-CAPILLARY: 218 mg/dL — AB (ref 65–99)
GLUCOSE-CAPILLARY: 249 mg/dL — AB (ref 65–99)
Glucose-Capillary: 289 mg/dL — ABNORMAL HIGH (ref 65–99)

## 2015-03-25 DIAGNOSIS — E119 Type 2 diabetes mellitus without complications: Secondary | ICD-10-CM | POA: Diagnosis not present

## 2015-03-25 LAB — GLUCOSE, CAPILLARY
GLUCOSE-CAPILLARY: 148 mg/dL — AB (ref 65–99)
Glucose-Capillary: 125 mg/dL — ABNORMAL HIGH (ref 65–99)
Glucose-Capillary: 237 mg/dL — ABNORMAL HIGH (ref 65–99)
Glucose-Capillary: 109 mg/dL — ABNORMAL HIGH (ref 65–99)
Glucose-Capillary: 149 mg/dL — ABNORMAL HIGH (ref 65–99)

## 2015-03-26 DIAGNOSIS — E119 Type 2 diabetes mellitus without complications: Secondary | ICD-10-CM | POA: Diagnosis not present

## 2015-03-26 LAB — GLUCOSE, CAPILLARY
GLUCOSE-CAPILLARY: 245 mg/dL — AB (ref 65–99)
GLUCOSE-CAPILLARY: 121 mg/dL — AB (ref 65–99)
GLUCOSE-CAPILLARY: 133 mg/dL — AB (ref 65–99)
GLUCOSE-CAPILLARY: 128 mg/dL — AB (ref 65–99)
GLUCOSE-CAPILLARY: 307 mg/dL — AB (ref 65–99)
GLUCOSE-CAPILLARY: 280 mg/dL — AB (ref 65–99)
Glucose-Capillary: 178 mg/dL — ABNORMAL HIGH (ref 65–99)

## 2015-03-27 DIAGNOSIS — E119 Type 2 diabetes mellitus without complications: Secondary | ICD-10-CM | POA: Diagnosis not present

## 2015-03-27 LAB — GLUCOSE, CAPILLARY: GLUCOSE-CAPILLARY: 157 mg/dL — AB (ref 65–99)

## 2015-03-29 ENCOUNTER — Ambulatory Visit: Payer: PPO | Attending: Internal Medicine

## 2015-03-29 DIAGNOSIS — R2681 Unsteadiness on feet: Secondary | ICD-10-CM

## 2015-03-29 NOTE — Therapy (Signed)
[place] MAIN [facility] SERVICES [address] [place], [place], [address] Phone: [phone]   Fax:  [phone]  Physical Therapy Evaluation  Patient Details  Name: Elizabeth Rivera MRN: [id number] Date of Birth: [birth year] Referring Provider: Dr. Ouida Sills  Encounter Date: 03/29/2015      PT End of Session - 03/29/15 1549    Visit Number 1   Number of Visits 17   Date for PT Re-Evaluation 04/26/15   Authorization Type 1/10   PT Start Time 1100   PT Stop Time 1150   PT Time Calculation (min) 50 min   Equipment Utilized During Treatment Gait belt   Activity Tolerance Patient tolerated treatment well   Behavior During Therapy Flat affect  motivated      Past Medical History  Diagnosis Date  . Thyroid disease     hypothyroid  . Anxiety   . Hyperlipidemia   . Hypertension   . Other chest pain     chest discomfort  . Obesity   . Neuropathy ([place])   . Other esophagitis     erosive esophagitis  . Other specified gastritis without mention of hemorrhage     erosvie gastritis  . Pain in limb     Right leg pain  . Other specified disorders of adrenal glands 07/2007    adrenal mass via of CT 1.4cm left Adrenal no change(Dr.Cope)   . Other abnormal blood chemistry   . Diverticulosis of colon (without mention of hemorrhage) 05/20/2009    Internal hemms (Dr. Vira Agar)  . Irritable bowel syndrome     history of  . Insomnia, unspecified   . Nervous breakdown 08/2002    [facility]  . GERD (gastroesophageal reflux disease)     reflux HH 09/01/2002//egd reflux Esoph. HH Gastritis nonbleeding erosive gastropathy Duodentitis 08/15/2006  . Hyperglycemia   . Candidal vaginitis   . Forearm fracture     2013, left  . Anemia   . Anemia of other chronic disease 09/01/2013  . Diabetes mellitus without complication ([place])   . Stroke ([place]) 03/2014  . Stroke [facility]) 03/2015    Past Surgical History  Procedure Laterality Date  . Cholecystectomy   03/2004  . Abdominal hysterectomy  1995    Part Hyst BSO DUB ovarian cyst B9  . Vaginal delivery  1977  . Breast reduction surgery  1984  . Cystoscopy  01/08/2008    Former site of inflammation healed (Dr. Jacqlyn Larsen)  . Nsvd x1  1977  . Esophagogastroduodenoscopy  1. 09/01/02  2. 08/15/06    1. Reflux, HH  2. Reflux esoph HH gaastritis nonbleeding erosive gastropathy duodenitis  . Colonoscopy  1. 08/15/06  2. 05/20/09    Int Hemms  2. Divertics Int Hemms (Dr. Vira Agar)  . Ct abd w & pelvis wo cm  6/09    CT Scan abd stable adrenal adenoma 1.4cm left adrenal no change (Dr. Jacqlyn Larsen)    There were no vitals filed for this visit.  Visit Diagnosis:  Unsteadiness on feet - Plan: PT plan of care cert/re-cert      Subjective Assessment - 03/29/15 1111    Subjective pt reports having a second CVA 03/11/2015. she was hospitalized then sent to SNF where she reports she worked very hard to make progress.  pt reports the recent stroke effected her L side. Her stroke 1 year ago affected the R side. pt reports her R side of her head still hurts. pt reports her first stroke left  her with some balance deficits and she walked with a cane. Now she has to use a walker. pt reports  she has a history of numbness in the feet due to neuropathy. pt reports she needs assistance with bathing, walking and ADLs currently due to the most recent stroke.    Patient Stated Goals "get back my strength, and be able to hold my balance."   Currently in Pain? No/denies            [facility] PT Assessment - 03/29/15 0001    Assessment   Medical Diagnosis CVA   Referring Provider Dr. Ouida Sills   Onset Date/Surgical Date 03/11/15   Hand Dominance Right   Next MD Visit 04/05/15   Prior Therapy PT   Precautions   Precautions Fall   Restrictions   Weight Bearing Restrictions No   Balance Screen   Has the patient fallen in the past 6 months Yes   How many times? 2   Has the patient had a decrease in activity level because of a fear of  falling?  Yes   Is the patient reluctant to leave their home because of a fear of falling?  Yes   [place] residence   Living Arrangements Spouse/significant other   Home Access Stairs to enter   Entrance Stairs-Number of Steps 2   Entrance Stairs-Rails Right   Prior Function   Level of Independence Independent with household mobility with device;Independent with basic ADLs   Cognition   Overall Cognitive Status Within Functional Limits for tasks assessed   Attention Focused   Focused Attention Appears intact   Memory Impaired   Behaviors --  Flat Affect   Coordination   Finger Nose Finger Test --  mild dyskinesis of the L    Heel Shin Test WNL   Bed Mobility   Bed Mobility Rolling Right;Rolling Left;Left Sidelying to Sit;Sit to Supine   Rolling Right 6: Modified independent (Device/Increase time)   Rolling Left 6: Modified independent (Device/Increase time)   Left Sidelying to Sit 6: Modified independent (Device/Increase time)   Sit to Supine 6: Modified independent (Device/Increase time)   Ambulation/Gait   Ambulation/Gait Yes   Ambulation/Gait Assistance 5: Supervision   Assistive device Rolling walker   Gait Pattern Shuffle;Lateral trunk lean to left;Trunk flexed;Narrow base of support   Standardized Balance Assessment   Standardized Balance Assessment Berg Balance Test;Timed Up and Go Test;Five Times Sit to Stand;10 meter walk test   Five times sit to stand comments  13.9   10 Meter Walk 0.1m/s   Berg Balance Test   Sit to Stand Able to stand without using hands and stabilize independently   Standing Unsupported Able to stand safely 2 minutes   Sitting with Back Unsupported but Feet Supported on Floor or Stool Able to sit safely and securely 2 minutes   Stand to Sit Sits safely with minimal use of hands   Transfers Able to transfer safely, definite need of hands   Standing Unsupported with Eyes Closed Able to stand 10 seconds with  supervision   Standing Ubsupported with Feet Together Able to place feet together independently and stand 1 minute safely   From Standing, Reach Forward with Outstretched Arm Can reach forward >12 cm safely (5")   From Standing Position, Pick up Object from Floor Able to pick up shoe, needs supervision   From Standing Position, Turn to Look Behind Over each Shoulder Turn sideways only but maintains balance  Turn 360 Degrees Able to turn 360 degrees safely in 4 seconds or less   Standing Unsupported, Alternately Place Feet on Step/Stool Able to complete 4 steps without aid or supervision   Standing Unsupported, One Foot in Front Able to plae foot ahead of the other independently and hold 30 seconds   Standing on One Leg Able to lift leg independently and hold 5-10 seconds   Total Score 46   Timed Up and Go Test   Normal TUG (seconds) 19         POSTURE/OBSERVATION: Standing L lateral lean and forward flexion, can correct with cues.   PROM/AROM: WNL bilaterally  STRENGTH:  Graded on a 0-5 scale Muscle Group Left Right  Shoulder flex 4 4+  Shoulder Abd 4 4+  Shoulder Ext 4 4  Shoulder IR/ER    Elbow    Wrist/hand     Hip Flex 4- 4  Hip Abd 4- 4  Hip Add 4- 4  Hip Ext 4- 4  Hip IR/ER    Knee Flex 4 4  Knee Ext 4 4  Ankle DF 4 4  Ankle PF 4 4   SENSATION: Impaired to toes and hands, reduced light touch and deep pressure   (-) clonus                         PT Education - 03/29/15 1548    Education provided Yes   Education Details exam findings, POC   Person(s) Educated Patient   Methods Explanation   Comprehension Verbalized understanding             PT Long Term Goals - 03/29/15 1552    PT LONG TERM GOAL #1   Title pt will perform TUG using LRAD in <12 s demonstrating reduced risk of falls.   Time 6   Period Weeks   Status New   PT LONG TERM GOAL #2   Title pt will score 52 or greater on the berg balance test, reducing fall risk.     Time 6   Period Weeks   Status New   PT LONG TERM GOAL #3   Title pt will ambulate using LRAD at least 0.74m/s for normalized home mobility.    Time 6   Period Weeks   Status New   PT LONG TERM GOAL #4   Title pt will ambulate x 135ft using SPC safely to return to PLOF.    Time 6   Period Weeks   Status New               Plan - 03/29/15 1549    Clinical Impression Statement pt presents as a 65 y/o f with hx of CVA 1 year ago, now suffering another 2 weeks ago. pt has mild L sided weakness more than the R side. pt demontrates impaired balance, shuffled gait, impaired balance which differs from her baseline which included walking with a cane. pt reports she was having some falls before the most recent CVA. pt would benefit from skilled PT services to improve strength, gait, balance to reduce fall risk and maximize functional mobility.    Pt will benefit from skilled therapeutic intervention in order to improve on the following deficits Abnormal gait;Decreased activity tolerance;Decreased balance;Decreased strength;Impaired sensation;Decreased endurance   Rehab Potential Good   Clinical Impairments Affecting Rehab Potential HTN, DM, hx of anxiety/ depression, previous CVA   PT Frequency 2x / week   PT Duration 6 weeks  PT Treatment/Interventions ADLs/Self Care Home Management;Aquatic Therapy;Balance training;Therapeutic exercise;Therapeutic activities;Gait training;Manual techniques   PT Next Visit Plan progress hip strengthening HEP   Consulted and Agree with Plan of Care Patient;Family member/caregiver          G-Codes - 04-27-2015 1554    Functional Assessment Tool Used BERG/ 47m walk, TUG   Functional Limitation Mobility: Walking and moving around   Mobility: Walking and Moving Around Current Status [phone]) At least 40 percent but less than 60 percent impaired, limited or restricted   Mobility: Walking and Moving Around Goal Status [phone]) At least 20 percent but less than  40 percent impaired, limited or restricted       Problem List Patient Active Problem List   Diagnosis Date Noted  . Cerebrovascular accident (CVA) due to occlusion of cerebral artery ([place])   . Petechial rash   . Venous hemorrhage   . Petechiae   . Absolute anemia   . Accelerated hypertension 03/11/2015  . Intractable nausea and vomiting 03/11/2015  . Anxiety 03/11/2015  . Stroke ([place]) 03/11/2015  . Pinworm infection 01/10/2015  . Hand pain 09/09/2014  . Psychomotor retardation 05/25/2014  . Syncope, cardiogenic 05/05/2014  . Tremor 05/05/2014  . Multiple falls 11/28/2013  . Elevated immunoglobulin A 09/02/2013  . Anemia of other chronic disease 09/01/2013  . Barrett's esophagus 08/18/2013  . Other malaise and fatigue 07/29/2013  . Diarrhea 03/18/2013  . DM (diabetes mellitus), type 2 with neurological complications ([place]) [id number]  . Bilateral leg pain 07/02/2012  . Urinary incontinence 09/25/2011  . Diaphoresis 12/21/2010  . Leg edema 05/11/2010  . B12 deficiency 05/11/2010  . BENIGN POSITIONAL VERTIGO 01/06/2009  . ABDOMINAL PAIN, LEFT UPPER QUADRANT 12/10/2008  . UNSPECIFIED VITAMIN D DEFICIENCY 09/07/2008  . ROSACEA 09/07/2008  . NEUROPATHY 08/24/2008  . Other chest pain 05/19/2008  . EROSIVE ESOPHAGITIS 02/03/2008  . EROSIVE GASTRITIS 02/03/2008  . Anxiety state 02/14/2007  . OBESITY 11/15/2006  . Extremity pain 09/28/2006  . ADRENAL MASS VIA CT 07/07/2006  . Hypothyroidism 07/05/2006  . HYPERLIPIDEMIA, MIXED 07/05/2006  . Essential hypertension 07/05/2006  . GERD 07/05/2006  . DIVERTICULOSIS, COLON 07/05/2006  . IBS 07/05/2006  . INSOMNIA 07/05/2006   Gorden Harms. Janisha Bueso, PT, DPT [phone]  [place] 04-27-15, 3:57 PM  [place] MAIN [facility] SERVICES [address] [place], [place], [address] Phone: [phone]   Fax:  [phone]  Name: Elizabeth Rivera MRN: [id number] Date of Birth: 14-Jun-1950

## 2015-03-31 ENCOUNTER — Ambulatory Visit: Payer: PPO

## 2015-03-31 ENCOUNTER — Telehealth: Payer: Self-pay | Admitting: Family Medicine

## 2015-03-31 VITALS — BP 177/95 | HR 70

## 2015-03-31 DIAGNOSIS — R2681 Unsteadiness on feet: Secondary | ICD-10-CM | POA: Diagnosis not present

## 2015-03-31 NOTE — Telephone Encounter (Signed)
Appointment 2/23 at 1200 with Dr. Damita Dunnings.

## 2015-03-31 NOTE — Therapy (Signed)
[place] MAIN [facility] SERVICES [address] [place], [place], [address] Phone: [phone]   Fax:  [phone]  Physical Therapy Treatment  Patient Details  Name: Elizabeth Rivera MRN: [id number] Date of Birth: 04/16/1950 Referring Provider: Dr. Ouida Sills  Encounter Date: 03/31/2015      PT End of Session - 03/31/15 1524    Visit Number 2   Number of Visits 17   Date for PT Re-Evaluation 04/26/15   Authorization Type 2/10   Equipment Utilized During Treatment Gait belt   Activity Tolerance Patient tolerated treatment well   Behavior During Therapy Flat affect  motivated      Past Medical History  Diagnosis Date  . Thyroid disease     hypothyroid  . Anxiety   . Hyperlipidemia   . Hypertension   . Other chest pain     chest discomfort  . Obesity   . Neuropathy ([place])   . Other esophagitis     erosive esophagitis  . Other specified gastritis without mention of hemorrhage     erosvie gastritis  . Pain in limb     Right leg pain  . Other specified disorders of adrenal glands 07/2007    adrenal mass via of CT 1.4cm left Adrenal no change(Dr.Cope)   . Other abnormal blood chemistry   . Diverticulosis of colon (without mention of hemorrhage) 05/20/2009    Internal hemms (Dr. Vira Agar)  . Irritable bowel syndrome     history of  . Insomnia, unspecified   . Nervous breakdown 08/2002    [facility]  . GERD (gastroesophageal reflux disease)     reflux HH 09/01/2002//egd reflux Esoph. HH Gastritis nonbleeding erosive gastropathy Duodentitis 08/15/2006  . Hyperglycemia   . Candidal vaginitis   . Forearm fracture     2013, left  . Anemia   . Anemia of other chronic disease 09/01/2013  . Diabetes mellitus without complication ([place])   . Stroke ([place]) 03/2014  . Stroke [facility]) 03/2015    Past Surgical History  Procedure Laterality Date  . Cholecystectomy  03/2004  . Abdominal hysterectomy  1995    Part Hyst BSO DUB ovarian cyst B9  .  Vaginal delivery  1977  . Breast reduction surgery  1984  . Cystoscopy  01/08/2008    Former site of inflammation healed (Dr. Jacqlyn Larsen)  . Nsvd x1  1977  . Esophagogastroduodenoscopy  1. 09/01/02  2. 08/15/06    1. Reflux, HH  2. Reflux esoph HH gaastritis nonbleeding erosive gastropathy duodenitis  . Colonoscopy  1. 08/15/06  2. 05/20/09    Int Hemms  2. Divertics Int Hemms (Dr. Vira Agar)  . Ct abd w & pelvis wo cm  6/09    CT Scan abd stable adrenal adenoma 1.4cm left adrenal no change (Dr. Jacqlyn Larsen)    Filed Vitals:   03/31/15 1525  BP: 177/95  Pulse: 70    Visit Diagnosis:  Unsteadiness on feet      Subjective Assessment - 03/31/15 1521    Subjective pt reports she is ready to exercise in therapy. she reports she had a HA earlier, but its better now after taking medication.    Patient Stated Goals "get back my strength, and be able to hold my balance."   Currently in Pain? No/denies      ThereX: Nustep L  3x 2 min no charge warm up Standing resisted hip flexion, abduction, and extension with   yellow     Band 2x 10  each way with light UE support Fwd step up onto 4inch step - single UE x 10 each leg PT assessed vitals which were high (BP), but were still within PT treatment limits.     NMR: standing fwd, retro and side lunge x 10 each leg no UE Side step over cane on ground x 10 Standing on AIREX with NBOS- trunk turns withball and off x 15 each side Standing on AIREX with NBOS EO/EC 10s x 10  pt requires CGA for safety on balance exercises                              PT Long Term Goals - 03/29/15 1552    PT LONG TERM GOAL #1   Title pt will perform TUG using LRAD in <12 s demonstrating reduced risk of falls.   Time 6   Period Weeks   Status New   PT LONG TERM GOAL #2   Title pt will score 52 or greater on the berg balance test, reducing fall risk.    Time 6   Period Weeks   Status New   PT LONG TERM GOAL #3   Title pt will ambulate using LRAD at  least 0.70m/s for normalized home mobility.    Time 6   Period Weeks   Status New   PT LONG TERM GOAL #4   Title pt will ambulate x 121ft using SPC safely to return to PLOF.    Time 6   Period Weeks   Status New               Plan - 03/31/15 1524    Clinical Impression Statement pt did well with session today. had more balance difficutly on compliant surface with eyes closed, but did well despite deficits in sensaion and recent CVA. pt needed rest breaks x 2 in session due to fatigue. pt had difficutly shifting weight onto LLE >RLE.    Pt will benefit from skilled therapeutic intervention in order to improve on the following deficits Abnormal gait;Decreased activity tolerance;Decreased balance;Decreased strength;Impaired sensation;Decreased endurance   Rehab Potential Good   Clinical Impairments Affecting Rehab Potential HTN, DM, hx of anxiety/ depression, previous CVA   PT Frequency 2x / week   PT Duration 6 weeks   PT Treatment/Interventions ADLs/Self Care Home Management;Aquatic Therapy;Balance training;Therapeutic exercise;Therapeutic activities;Gait training;Manual techniques   PT Next Visit Plan progress hip strengthening HEP   Consulted and Agree with Plan of Care Patient;Family member/caregiver        Problem List Patient Active Problem List   Diagnosis Date Noted  . Cerebrovascular accident (CVA) due to occlusion of cerebral artery ([place])   . Petechial rash   . Venous hemorrhage   . Petechiae   . Absolute anemia   . Accelerated hypertension 03/11/2015  . Intractable nausea and vomiting 03/11/2015  . Anxiety 03/11/2015  . Stroke ([place]) 03/11/2015  . Pinworm infection 01/10/2015  . Hand pain 09/09/2014  . Psychomotor retardation 05/25/2014  . Syncope, cardiogenic 05/05/2014  . Tremor 05/05/2014  . Multiple falls 11/28/2013  . Elevated immunoglobulin A 09/02/2013  . Anemia of other chronic disease 09/01/2013  . Barrett's esophagus 08/18/2013  . Other malaise  and fatigue 07/29/2013  . Diarrhea 03/18/2013  . DM (diabetes mellitus), type 2 with neurological complications ([place]) [id number]  . Bilateral leg pain 07/02/2012  . Urinary incontinence 09/25/2011  . Diaphoresis 12/21/2010  . Leg edema 05/11/2010  . B12  deficiency 05/11/2010  . BENIGN POSITIONAL VERTIGO 01/06/2009  . ABDOMINAL PAIN, LEFT UPPER QUADRANT 12/10/2008  . UNSPECIFIED VITAMIN D DEFICIENCY 09/07/2008  . ROSACEA 09/07/2008  . NEUROPATHY 08/24/2008  . Other chest pain 05/19/2008  . EROSIVE ESOPHAGITIS 02/03/2008  . EROSIVE GASTRITIS 02/03/2008  . Anxiety state 02/14/2007  . OBESITY 11/15/2006  . Extremity pain 09/28/2006  . ADRENAL MASS VIA CT 07/07/2006  . Hypothyroidism 07/05/2006  . HYPERLIPIDEMIA, MIXED 07/05/2006  . Essential hypertension 07/05/2006  . GERD 07/05/2006  . DIVERTICULOSIS, COLON 07/05/2006  . IBS 07/05/2006  . INSOMNIA 07/05/2006   Gorden Harms. Miquel Lamson, PT, DPT [phone]  Zaydan Papesh 03/31/2015, 3:26 PM  [place] MAIN [facility] SERVICES [address] [place], [place], [address] Phone: [phone]   Fax:  [phone]  Name: Elizabeth Rivera MRN: [id number] Date of Birth: 07-03-1950

## 2015-03-31 NOTE — Telephone Encounter (Signed)
Pt has regularly scheduled appts with neurology. Additionally, there is a pending 3 mth f/u scheduled with PCP on 04/08/15.

## 2015-03-31 NOTE — Telephone Encounter (Signed)
Patient Name: Elizabeth Rivera  DOB: Dec 17, 1950    Initial Comment [name] states she is concerned her BP is running high. Current BP 183/104   Nurse Assessment  Nurse: Raphael Gibney, RN, [place] Date/Time (Eastern Time): 03/31/2015 2:05:11 PM  Confirm and document reason for call. If symptomatic, describe symptoms. You must click the next button to save text entered. ---[name] states her BP is 183/104 today. BP was elevated yesterday. She is taking metoprolol and lisinopril. Not SOB, dizzy or no chest pain. She had rehab this am at the hospital.  Has the patient traveled out of the country within the last 30 days? ---Not Applicable  Does the patient have any new or worsening symptoms? ---Yes  Will a triage be completed? ---Yes  Related visit to physician within the last 2 weeks? ---No  Does the PT have any chronic conditions? (i.e. diabetes, asthma, etc.) ---Yes  List chronic conditions. ---diabetes; HTN; CVA  Is this a behavioral health or substance abuse call? ---No     Guidelines    Guideline Title Affirmed Question Affirmed Notes  High Blood Pressure BP ? 160/100    Final Disposition User   See PCP When Office is Open (within 3 days) Raphael Gibney, RN, Vanita Ingles    Comments  Appt scheduled for 04/01/2015 at 12 noon with Dr. Damita Dunnings   Referrals  REFERRED TO PCP OFFICE   Disagree/Comply: Comply

## 2015-04-01 ENCOUNTER — Other Ambulatory Visit: Payer: PPO

## 2015-04-01 ENCOUNTER — Ambulatory Visit (INDEPENDENT_AMBULATORY_CARE_PROVIDER_SITE_OTHER): Payer: PPO | Admitting: Family Medicine

## 2015-04-01 ENCOUNTER — Encounter: Payer: Self-pay | Admitting: Family Medicine

## 2015-04-01 VITALS — BP 162/88 | HR 76 | Temp 97.8°F | Wt 139.0 lb

## 2015-04-01 DIAGNOSIS — D649 Anemia, unspecified: Secondary | ICD-10-CM

## 2015-04-01 DIAGNOSIS — I6350 Cerebral infarction due to unspecified occlusion or stenosis of unspecified cerebral artery: Secondary | ICD-10-CM | POA: Diagnosis not present

## 2015-04-01 DIAGNOSIS — E1149 Type 2 diabetes mellitus with other diabetic neurological complication: Secondary | ICD-10-CM

## 2015-04-01 DIAGNOSIS — R55 Syncope and collapse: Secondary | ICD-10-CM

## 2015-04-01 DIAGNOSIS — E782 Mixed hyperlipidemia: Secondary | ICD-10-CM

## 2015-04-01 DIAGNOSIS — I10 Essential (primary) hypertension: Secondary | ICD-10-CM | POA: Diagnosis not present

## 2015-04-01 LAB — COMPREHENSIVE METABOLIC PANEL
ALBUMIN: 4.2 g/dL (ref 3.5–5.2)
ALK PHOS: 71 U/L (ref 39–117)
ALT: 14 U/L (ref 0–35)
AST: 14 U/L (ref 0–37)
BUN: 16 mg/dL (ref 6–23)
CALCIUM: 9.8 mg/dL (ref 8.4–10.5)
CHLORIDE: 104 meq/L (ref 96–112)
CO2: 28 mEq/L (ref 19–32)
CREATININE: 0.86 mg/dL (ref 0.40–1.20)
GFR: 70.81 mL/min (ref >60.00)
Glucose, Bld: 195 mg/dL — ABNORMAL HIGH (ref 70–99)
Potassium: 4.2 mEq/L (ref 3.5–5.1)
Sodium: 140 mEq/L (ref 135–145)
TOTAL PROTEIN: 7.3 g/dL (ref 6.0–8.3)
Total Bilirubin: 0.4 mg/dL (ref 0.2–1.2)

## 2015-04-01 LAB — CBC WITH DIFFERENTIAL/PLATELET
BASOS PCT: 0.5 % (ref 0.0–3.0)
Basophils Absolute: 0 10*3/uL (ref 0.0–0.1)
EOS PCT: 2.6 % (ref 0.0–5.0)
Eosinophils Absolute: 0.1 10*3/uL (ref 0.0–0.7)
HEMATOCRIT: 31.9 % — AB (ref 36.0–46.0)
HEMOGLOBIN: 10.5 g/dL — AB (ref 12.0–15.0)
LYMPHS PCT: 26.8 % (ref 12.0–46.0)
Lymphs Abs: 1.4 10*3/uL (ref 0.7–4.0)
MCHC: 32.9 g/dL (ref 30.0–36.0)
MCV: 84.6 fl (ref 78.0–100.0)
Monocytes Absolute: 0.3 10*3/uL (ref 0.1–1.0)
Monocytes Relative: 6.5 % (ref 3.0–12.0)
Neutro Abs: 3.3 10*3/uL (ref 1.4–7.7)
Neutrophils Relative %: 63.6 % (ref 43.0–77.0)
Platelets: 198 10*3/uL (ref 150.0–400.0)
RBC: 3.78 Mil/uL — ABNORMAL LOW (ref 3.87–5.11)
RDW: 14.9 % (ref 11.5–15.5)
WBC: 5.3 10*3/uL (ref 4.0–10.5)

## 2015-04-01 LAB — IBC PANEL
IRON: 50 ug/dL (ref 42–145)
Saturation Ratios: 15.7 % — ABNORMAL LOW (ref 20.0–50.0)
TRANSFERRIN: 228 mg/dL (ref 212.0–360.0)

## 2015-04-01 MED ORDER — METOPROLOL SUCCINATE ER 100 MG PO TB24: ORAL_TABLET | ORAL | Status: DC

## 2015-04-01 NOTE — Assessment & Plan Note (Signed)
Can recheck A1c later in the spring, okay to continue as is for now.  No change in meds.

## 2015-04-01 NOTE — Patient Instructions (Signed)
Go to the lab on the way out.  We'll contact you with your lab report. Skip the iron for a few days and keep using dulcolax suppositories.   That should help.  Increase the metoprolol to 1.5 tabs a day and update me about your BP and pulse in a few days.   Take care.  Glad to see you.

## 2015-04-01 NOTE — Assessment & Plan Note (Signed)
Statin added prev, continue as is.  D/w pt.

## 2015-04-01 NOTE — Progress Notes (Signed)
Pre visit review using our clinic review tool, if applicable. No additional management support is needed unless otherwise documented below in the visit note.  Prev admitted with CVA.  D/w pt about secondary prevention re: CVA, ie BP, lipids, DM2.  No residual deficit except for tendency to lean to the L with walking.  Still using her walker, still in PT.  Some gait improvement noted by patient with therapy.   Hypertension:    Using medication without problems or lightheadedness: yes Chest pain with exertion:no Edema: some BLE, in the feet, at the end of the day.   Short of breath:only from anxiety sx Average home BPs: elevated on home checks.  See prev phone notes Other issues: still with HA Still on meds listed in the EMR.    Constipation.  On iron.  No other worms passed recently.    DM2.  Sugar ~130 this Am.  Last A1c <7.5. Compliant with meds.   PMH and SH reviewed  ROS: See HPI, otherwise noncontributory.  Meds, vitals, and allergies reviewed.   GEN: nad, alert and oriented HEENT: mucous membranes moist NECK: supple w/o LA CV: rrr. PULM: ctab, no inc wob ABD: soft, +bs EXT: no edema Walking with walker, gait appears symmetric.

## 2015-04-01 NOTE — Assessment & Plan Note (Signed)
Inc metoprolol to 150mg  a day and update me in a few day with BP and pulse reading.  Recheck basic labs today.  See notes on labs.

## 2015-04-01 NOTE — Assessment & Plan Note (Signed)
Continue secondary prevention, d/w pt.  See notes re: BP elevation and BB dose change.

## 2015-04-01 NOTE — Assessment & Plan Note (Signed)
Hold iron for now given the constipation, add on colace and recheck cbc/iron labs.  dw pt.  She agrees.

## 2015-04-02 ENCOUNTER — Emergency Department: Payer: PPO

## 2015-04-02 ENCOUNTER — Telehealth: Payer: Self-pay | Admitting: Family Medicine

## 2015-04-02 ENCOUNTER — Observation Stay
Admission: EM | Admit: 2015-04-02 | Discharge: 2015-04-03 | Disposition: A | Discharge status: Home / Self Care | Payer: PPO | Attending: Internal Medicine | Admitting: Internal Medicine

## 2015-04-02 ENCOUNTER — Encounter: Payer: Self-pay | Admitting: *Deleted

## 2015-04-02 DIAGNOSIS — G43909 Migraine, unspecified, not intractable, without status migrainosus: Secondary | ICD-10-CM | POA: Diagnosis not present

## 2015-04-02 DIAGNOSIS — R1013 Epigastric pain: Secondary | ICD-10-CM | POA: Diagnosis not present

## 2015-04-02 DIAGNOSIS — D638 Anemia in other chronic diseases classified elsewhere: Secondary | ICD-10-CM | POA: Diagnosis not present

## 2015-04-02 DIAGNOSIS — I160 Hypertensive urgency: Secondary | ICD-10-CM

## 2015-04-02 DIAGNOSIS — R079 Chest pain, unspecified: Secondary | ICD-10-CM | POA: Diagnosis present

## 2015-04-02 DIAGNOSIS — G44209 Tension-type headache, unspecified, not intractable: Secondary | ICD-10-CM | POA: Diagnosis not present

## 2015-04-02 DIAGNOSIS — E119 Type 2 diabetes mellitus without complications: Secondary | ICD-10-CM | POA: Insufficient documentation

## 2015-04-02 DIAGNOSIS — Z8719 Personal history of other diseases of the digestive system: Secondary | ICD-10-CM | POA: Diagnosis not present

## 2015-04-02 DIAGNOSIS — K589 Irritable bowel syndrome without diarrhea: Secondary | ICD-10-CM | POA: Diagnosis not present

## 2015-04-02 DIAGNOSIS — E785 Hyperlipidemia, unspecified: Secondary | ICD-10-CM | POA: Diagnosis not present

## 2015-04-02 DIAGNOSIS — K219 Gastro-esophageal reflux disease without esophagitis: Secondary | ICD-10-CM | POA: Insufficient documentation

## 2015-04-02 DIAGNOSIS — Z791 Long term (current) use of non-steroidal anti-inflammatories (NSAID): Secondary | ICD-10-CM | POA: Insufficient documentation

## 2015-04-02 DIAGNOSIS — Z7902 Long term (current) use of antithrombotics/antiplatelets: Secondary | ICD-10-CM | POA: Diagnosis not present

## 2015-04-02 DIAGNOSIS — I10 Essential (primary) hypertension: Secondary | ICD-10-CM | POA: Diagnosis not present

## 2015-04-02 DIAGNOSIS — M79604 Pain in right leg: Secondary | ICD-10-CM | POA: Insufficient documentation

## 2015-04-02 DIAGNOSIS — Z8673 Personal history of transient ischemic attack (TIA), and cerebral infarction without residual deficits: Secondary | ICD-10-CM | POA: Diagnosis not present

## 2015-04-02 DIAGNOSIS — E669 Obesity, unspecified: Secondary | ICD-10-CM | POA: Insufficient documentation

## 2015-04-02 DIAGNOSIS — Z794 Long term (current) use of insulin: Secondary | ICD-10-CM | POA: Diagnosis not present

## 2015-04-02 DIAGNOSIS — G8929 Other chronic pain: Secondary | ICD-10-CM | POA: Diagnosis not present

## 2015-04-02 DIAGNOSIS — Z7982 Long term (current) use of aspirin: Secondary | ICD-10-CM | POA: Diagnosis not present

## 2015-04-02 DIAGNOSIS — M7989 Other specified soft tissue disorders: Secondary | ICD-10-CM | POA: Diagnosis not present

## 2015-04-02 DIAGNOSIS — E039 Hypothyroidism, unspecified: Secondary | ICD-10-CM | POA: Diagnosis not present

## 2015-04-02 DIAGNOSIS — R0602 Shortness of breath: Secondary | ICD-10-CM | POA: Diagnosis not present

## 2015-04-02 LAB — BASIC METABOLIC PANEL
Anion gap: 7 (ref 5–15)
BUN: 14 mg/dL (ref 6–20)
CHLORIDE: 105 mmol/L (ref 101–111)
CO2: 26 mmol/L (ref 22–32)
CREATININE: 0.82 mg/dL (ref 0.44–1.00)
Calcium: 9.4 mg/dL (ref 8.9–10.3)
GFR calc Af Amer: >60 mL/min (ref >60)
GFR calc non Af Amer: >60 mL/min (ref >60)
Glucose, Bld: 81 mg/dL (ref 65–99)
Potassium: 3.9 mmol/L (ref 3.5–5.1)
SODIUM: 138 mmol/L (ref 135–145)

## 2015-04-02 LAB — GLUCOSE, CAPILLARY
GLUCOSE-CAPILLARY: 175 mg/dL — AB (ref 65–99)
Glucose-Capillary: 75 mg/dL (ref 65–99)

## 2015-04-02 LAB — CBC
HCT: 31 % — ABNORMAL LOW (ref 35.0–47.0)
Hemoglobin: 10.5 g/dL — ABNORMAL LOW (ref 12.0–16.0)
MCH: 28.4 pg (ref 26.0–34.0)
MCHC: 34 g/dL (ref 32.0–36.0)
MCV: 83.5 fL (ref 80.0–100.0)
PLATELETS: 171 10*3/uL (ref 150–440)
RBC: 3.71 MIL/uL — ABNORMAL LOW (ref 3.80–5.20)
RDW: 14.4 % (ref 11.5–14.5)
WBC: 5.1 10*3/uL (ref 3.6–11.0)

## 2015-04-02 LAB — TROPONIN I: Troponin I: <0.03 ng/mL (ref <0.031)

## 2015-04-02 MED ORDER — CLOPIDOGREL BISULFATE 75 MG PO TABS
75.0000 mg | ORAL_TABLET | Freq: Every day | ORAL | Status: DC
  Administered 2015-04-03: 75 mg via ORAL
  Filled 2015-04-02: qty 1

## 2015-04-02 MED ORDER — METOPROLOL TARTRATE 1 MG/ML IV SOLN
5.0000 mg | Freq: Once | INTRAVENOUS | Status: AC
  Administered 2015-04-02: 5 mg via INTRAVENOUS
  Filled 2015-04-02: qty 5

## 2015-04-02 MED ORDER — INSULIN GLARGINE 100 UNIT/ML ~~LOC~~ SOLN
9.0000 [IU] | Freq: Every day | SUBCUTANEOUS | Status: DC
Start: 2015-04-02 — End: 2015-04-03
  Administered 2015-04-02: 9 [IU] via SUBCUTANEOUS
  Filled 2015-04-02 (×3): qty 0.09

## 2015-04-02 MED ORDER — SODIUM CHLORIDE 0.9% FLUSH: 3.0000 mL | INTRAVENOUS | Status: DC | PRN

## 2015-04-02 MED ORDER — METOPROLOL TARTRATE 1 MG/ML IV SOLN
2.5000 mg | Freq: Once | INTRAVENOUS | Status: AC
  Administered 2015-04-02: 2.5 mg via INTRAVENOUS
  Filled 2015-04-02: qty 5

## 2015-04-02 MED ORDER — METFORMIN HCL 500 MG PO TABS
500.0000 mg | ORAL_TABLET | Freq: Two times a day (BID) | ORAL | Status: DC
  Administered 2015-04-03: 500 mg via ORAL
  Filled 2015-04-02: qty 1

## 2015-04-02 MED ORDER — SODIUM CHLORIDE 0.9 % IV SOLN: 250.0000 mL | INTRAVENOUS | Status: DC | PRN

## 2015-04-02 MED ORDER — PANTOPRAZOLE SODIUM 40 MG PO TBEC
40.0000 mg | DELAYED_RELEASE_TABLET | Freq: Two times a day (BID) | ORAL | Status: DC
  Administered 2015-04-03: 40 mg via ORAL
  Filled 2015-04-02: qty 1

## 2015-04-02 MED ORDER — ASPIRIN EC 81 MG PO TBEC
81.0000 mg | DELAYED_RELEASE_TABLET | Freq: Every day | ORAL | Status: DC
  Administered 2015-04-03: 81 mg via ORAL
  Filled 2015-04-02: qty 1

## 2015-04-02 MED ORDER — VENLAFAXINE HCL ER 75 MG PO CP24
150.0000 mg | ORAL_CAPSULE | Freq: Two times a day (BID) | ORAL | Status: DC
  Administered 2015-04-02 – 2015-04-03 (×2): 150 mg via ORAL
  Filled 2015-04-02 (×2): qty 2

## 2015-04-02 MED ORDER — METOPROLOL SUCCINATE ER 50 MG PO TB24
150.0000 mg | ORAL_TABLET | Freq: Every day | ORAL | Status: DC
  Administered 2015-04-03: 150 mg via ORAL
  Filled 2015-04-02: qty 1

## 2015-04-02 MED ORDER — PREGABALIN 50 MG PO CAPS
150.0000 mg | ORAL_CAPSULE | Freq: Every morning | ORAL | Status: DC
  Administered 2015-04-03: 150 mg via ORAL
  Filled 2015-04-02: qty 3

## 2015-04-02 MED ORDER — OXYCODONE HCL 5 MG PO TABS
5.0000 mg | ORAL_TABLET | ORAL | Status: DC | PRN
  Administered 2015-04-02 – 2015-04-03 (×3): 5 mg via ORAL
  Filled 2015-04-02 (×3): qty 1

## 2015-04-02 MED ORDER — PREGABALIN 50 MG PO CAPS
75.0000 mg | ORAL_CAPSULE | Freq: Every day | ORAL | Status: DC
  Administered 2015-04-02: 75 mg via ORAL
  Filled 2015-04-02: qty 1

## 2015-04-02 MED ORDER — ASPIRIN-ACETAMINOPHEN-CAFFEINE 250-250-65 MG PO TABS: 1.0000 | ORAL_TABLET | Freq: Four times a day (QID) | ORAL | Status: DC | PRN

## 2015-04-02 MED ORDER — SODIUM CHLORIDE 0.9% FLUSH
3.0000 mL | Freq: Two times a day (BID) | INTRAVENOUS | Status: DC
  Administered 2015-04-02 – 2015-04-03 (×2): 3 mL via INTRAVENOUS

## 2015-04-02 MED ORDER — ENOXAPARIN SODIUM 40 MG/0.4ML ~~LOC~~ SOLN
40.0000 mg | SUBCUTANEOUS | Status: DC
  Administered 2015-04-02: 40 mg via SUBCUTANEOUS
  Filled 2015-04-02: qty 0.4

## 2015-04-02 MED ORDER — ASPIRIN 81 MG PO CHEW
324.0000 mg | CHEWABLE_TABLET | Freq: Once | ORAL | Status: AC
  Administered 2015-04-02: 324 mg via ORAL
  Filled 2015-04-02: qty 4

## 2015-04-02 MED ORDER — SODIUM CHLORIDE 0.9% FLUSH
3.0000 mL | Freq: Two times a day (BID) | INTRAVENOUS | Status: DC
Start: 2015-04-02 — End: 2015-04-03
  Administered 2015-04-03: 3 mL via INTRAVENOUS

## 2015-04-02 MED ORDER — ALPRAZOLAM 0.25 MG PO TABS
0.2500 mg | ORAL_TABLET | Freq: Two times a day (BID) | ORAL | Status: DC
  Administered 2015-04-02 – 2015-04-03 (×2): 0.25 mg via ORAL
  Filled 2015-04-02 (×2): qty 1

## 2015-04-02 MED ORDER — HYDRALAZINE HCL 20 MG/ML IJ SOLN: 10.0000 mg | Freq: Four times a day (QID) | INTRAMUSCULAR | Status: DC | PRN

## 2015-04-02 MED ORDER — HYDROCHLOROTHIAZIDE 25 MG PO TABS
25.0000 mg | ORAL_TABLET | Freq: Every day | ORAL | Status: DC
  Administered 2015-04-02 – 2015-04-03 (×2): 25 mg via ORAL
  Filled 2015-04-02 (×2): qty 1

## 2015-04-02 MED ORDER — ONDANSETRON HCL 4 MG/2ML IJ SOLN: 4.0000 mg | Freq: Four times a day (QID) | INTRAMUSCULAR | Status: DC | PRN

## 2015-04-02 MED ORDER — INSULIN ASPART 100 UNIT/ML ~~LOC~~ SOLN: 0.0000 [IU] | Freq: Every day | SUBCUTANEOUS | Status: DC

## 2015-04-02 MED ORDER — INSULIN ASPART 100 UNIT/ML ~~LOC~~ SOLN: 0.0000 [IU] | Freq: Three times a day (TID) | SUBCUTANEOUS | Status: DC

## 2015-04-02 MED ORDER — ACETAMINOPHEN 650 MG RE SUPP: 650.0000 mg | Freq: Four times a day (QID) | RECTAL | Status: DC | PRN

## 2015-04-02 MED ORDER — ATORVASTATIN CALCIUM 20 MG PO TABS: 40.0000 mg | ORAL_TABLET | Freq: Every day | ORAL | Status: DC

## 2015-04-02 MED ORDER — LEVOTHYROXINE SODIUM 125 MCG PO TABS
125.0000 ug | ORAL_TABLET | Freq: Every day | ORAL | Status: DC
  Administered 2015-04-03: 125 ug via ORAL
  Filled 2015-04-02: qty 1

## 2015-04-02 MED ORDER — ACETAMINOPHEN 325 MG PO TABS: 650.0000 mg | ORAL_TABLET | Freq: Four times a day (QID) | ORAL | Status: DC | PRN

## 2015-04-02 MED ORDER — POLYETHYLENE GLYCOL 3350 17 G PO PACK: 17.0000 g | PACK | Freq: Every day | ORAL | Status: DC | PRN

## 2015-04-02 MED ORDER — AMLODIPINE BESYLATE 5 MG PO TABS
5.0000 mg | ORAL_TABLET | Freq: Once | ORAL | Status: AC
  Administered 2015-04-02: 5 mg via ORAL
  Filled 2015-04-02: qty 1

## 2015-04-02 MED ORDER — LISINOPRIL 20 MG PO TABS
40.0000 mg | ORAL_TABLET | Freq: Every day | ORAL | Status: DC
  Administered 2015-04-03: 40 mg via ORAL
  Filled 2015-04-02: qty 2

## 2015-04-02 MED ORDER — ONDANSETRON HCL 4 MG PO TABS: 4.0000 mg | ORAL_TABLET | Freq: Four times a day (QID) | ORAL | Status: DC | PRN

## 2015-04-02 NOTE — H&P (Signed)
[facility] NAME: Elizabeth Rivera    MR#:  [id number]  DATE OF BIRTH:  Oct 31, 1950  DATE OF ADMISSION:  04/02/2015  PRIMARY CARE PHYSICIAN: Elsie Stain, MD   REQUESTING/REFERRING PHYSICIAN: Dr. Jacqualine Code  CHIEF COMPLAINT:   Chief Complaint  Patient presents with  . Chest Pain  . Hypertension    HISTORY OF PRESENT ILLNESS:  Elizabeth Rivera  is a 65 y.o. female with a known history of hypertension, diabetes, CVA presents to the emergency room complaining of uncontrolled blood pressure. She is blood pressure has been as high as 240/115 at home. Pressure has been running systolic [id number] over the last 4 days. No change in diet. Patient was recently discharged from the hospital after having a CVA and blood pressures have been systolic [id number]. She was seen by her PCP yesterday and metoprolol dose increased. Lisinopril was added during recent discharge from the hospital.   patient has had chronic on and off chest pain which was thought to be due to anxiety. Also has chronic headache which is a little worse today. No worsening of her stroke symptoms Patient is continuing her physical therapy at home. Still has some residual left-sided weakness. She has been given IV antihypertensives in the emergency room along with Norvasc with no improvement in blood pressure and is being admitted to the hospitalist service for accident or hypertension and rule out ACS with chest pain.  PAST MEDICAL HISTORY:   Past Medical History  Diagnosis Date  . Thyroid disease     hypothyroid  . Anxiety   . Hyperlipidemia   . Hypertension   . Other chest pain     chest discomfort  . Obesity   . Neuropathy ([place])   . Other esophagitis     erosive esophagitis  . Other specified gastritis without mention of hemorrhage     erosvie gastritis  . Pain in limb     Right leg pain  . Other specified disorders of adrenal glands 07/2007    adrenal mass via of CT 1.4cm left  Adrenal no change(Dr.Cope)   . Other abnormal blood chemistry   . Diverticulosis of colon (without mention of hemorrhage) 05/20/2009    Internal hemms (Dr. Vira Agar)  . Irritable bowel syndrome     history of  . Insomnia, unspecified   . Nervous breakdown 08/2002    [facility]  . GERD (gastroesophageal reflux disease)     reflux HH 09/01/2002//egd reflux Esoph. HH Gastritis nonbleeding erosive gastropathy Duodentitis 08/15/2006  . Hyperglycemia   . Candidal vaginitis   . Forearm fracture     2013, left  . Anemia   . Anemia of other chronic disease 09/01/2013  . Diabetes mellitus without complication ([place])   . Stroke ([place]) 03/2014  . Stroke [facility]) 03/2015    PAST SURGICAL HISTORY:   Past Surgical History  Procedure Laterality Date  . Cholecystectomy  03/2004  . Abdominal hysterectomy  1995    Part Hyst BSO DUB ovarian cyst B9  . Vaginal delivery  1977  . Breast reduction surgery  1984  . Cystoscopy  01/08/2008    Former site of inflammation healed (Dr. Jacqlyn Larsen)  . Nsvd x1  1977  . Esophagogastroduodenoscopy  1. 09/01/02  2. 08/15/06    1. Reflux, HH  2. Reflux esoph HH gaastritis nonbleeding erosive gastropathy duodenitis  . Colonoscopy  1. 08/15/06  2. 05/20/09    Int Hemms  2. Divertics Int Hemms (Dr.  Elliott)  . Ct abd w & pelvis wo cm  6/09    CT Scan abd stable adrenal adenoma 1.4cm left adrenal no change (Dr. Jacqlyn Larsen)    SOCIAL HISTORY:   Social History  Substance Use Topics  . Smoking status: Never Smoker   . Smokeless tobacco: Never Used  . Alcohol Use: No    FAMILY HISTORY:   Family History  Problem Relation Age of Onset  . Hypertension Mother   . Hyperlipidemia Mother   . Stroke Mother     60  . Vision loss Father     vision problems  . Cancer Father     Colon   . Hyperlipidemia Sister   . Hypertension Sister   . Vision loss Sister     DRUG ALLERGIES:   Allergies  Allergen Reactions  . Sulfa Antibiotics Rash    REVIEW OF SYSTEMS:   Review of  Systems  Constitutional: Negative for fever, chills, weight loss and malaise/fatigue.  HENT: Negative for hearing loss and nosebleeds.   Eyes: Negative for blurred vision, double vision and pain.  Respiratory: Negative for cough, hemoptysis, sputum production, shortness of breath and wheezing.   Cardiovascular: Positive for chest pain (chronic). Negative for palpitations, orthopnea and leg swelling.  Gastrointestinal: Negative for nausea, vomiting, abdominal pain, diarrhea and constipation.  Genitourinary: Negative for dysuria and hematuria.  Musculoskeletal: Negative for myalgias, back pain and falls.  Skin: Negative for rash.  Neurological: Positive for focal weakness (imrpoving) and headaches (chronic). Negative for dizziness, tremors, sensory change, speech change and seizures.  Endo/Heme/Allergies: Does not bruise/bleed easily.  Psychiatric/Behavioral: Negative for depression and memory loss. The patient is not nervous/anxious.     MEDICATIONS AT HOME:   Prior to Admission medications   Medication Sig Start Date End Date Taking? Authorizing Provider  acetaminophen (TYLENOL) 325 MG tablet Take 650 mg by mouth every 4 (four) hours as needed for mild pain, fever or headache.   Yes Historical Provider, MD  ALPRAZolam (XANAX) 0.25 MG tablet Take 1 tablet (0.25 mg total) by mouth 2 (two) times daily. 03/15/15  Yes Bettey Costa, MD  aspirin-acetaminophen-caffeine (EXCEDRIN MIGRAINE) [phone] MG tablet Take 1 tablet by mouth every 6 (six) hours as needed for headache.   Yes Historical Provider, MD  atorvastatin (LIPITOR) 40 MG tablet Take 1 tablet (40 mg total) by mouth daily at 6 PM. 03/15/15  Yes Bettey Costa, MD  cholecalciferol (VITAMIN D) 1000 units tablet Take 1,000 Units by mouth daily.   Yes Historical Provider, MD  clopidogrel (PLAVIX) 75 MG tablet Take 1 tablet (75 mg total) by mouth daily. 03/15/15  Yes Bettey Costa, MD  ferrous sulfate 325 (65 FE) MG tablet Take 1 tablet (325 mg total) by  mouth daily with breakfast. 08/03/13  Yes Tonia Ghent, MD  insulin glargine (LANTUS) 100 UNIT/ML injection Inject 9 Units into the skin at bedtime.   Yes Historical Provider, MD  levothyroxine (SYNTHROID, LEVOTHROID) 125 MCG tablet Take 1 tablet (125 mcg total) by mouth daily before breakfast. 03/05/15  Yes Tonia Ghent, MD  lisinopril (PRINIVIL,ZESTRIL) 40 MG tablet Take 1 tablet (40 mg total) by mouth daily. 03/16/15  Yes Bettey Costa, MD  Melatonin 5 MG TABS Take 5 mg by mouth at bedtime.   Yes Historical Provider, MD  metFORMIN (GLUCOPHAGE) 1000 MG tablet Take 0.5 tablets (500 mg total) by mouth 2 (two) times daily with a meal. 02/09/15  Yes Tonia Ghent, MD  metoprolol succinate (TOPROL-XL) 100 MG 24  hr tablet Take 150 mg by mouth daily. Take with or immediately following a meal.   Yes Historical Provider, MD  omeprazole (PRILOSEC) 40 MG capsule Take 1 capsule (40 mg total) by mouth 2 (two) times daily. 03/04/15  Yes Tonia Ghent, MD  pregabalin (LYRICA) 150 MG capsule Take 150 mg by mouth every morning.   Yes Historical Provider, MD  pregabalin (LYRICA) 75 MG capsule Take 75 mg by mouth at bedtime.   Yes Historical Provider, MD  ranitidine (ZANTAC) 300 MG tablet Take 1 tablet (300 mg total) by mouth daily. 01/08/15  Yes Tonia Ghent, MD  venlafaxine XR (EFFEXOR-XR) 150 MG 24 hr capsule Take 1 capsule (150 mg total) by mouth 2 (two) times daily. 01/08/15  Yes Tonia Ghent, MD  vitamin B-12 (CYANOCOBALAMIN) 1000 MCG tablet Take 1,000 mcg by mouth daily.    Yes Historical Provider, MD  feeding supplement, GLUCERNA SHAKE, (GLUCERNA SHAKE) LIQD Take 237 mLs by mouth 2 (two) times daily between meals. Patient not taking: Reported on 04/02/2015 03/15/15   Bettey Costa, MD     VITAL SIGNS:  Blood pressure 183/92, pulse 62, temperature 97.9 F (36.6 C), temperature source Oral, resp. rate 19, height 5' 2.5" (1.588 m), weight 62.596 kg (138 lb), SpO2 98 %.  PHYSICAL EXAMINATION:  Physical  Exam  GENERAL:  65 y.o.-year-old patient lying in the bed with no acute distress.  EYES: Pupils equal, round, reactive to light and accommodation. No scleral icterus. Extraocular muscles intact.  HEENT: Head atraumatic, normocephalic. Oropharynx and nasopharynx clear. No oropharyngeal erythema, moist oral mucosa  NECK:  Supple, no jugular venous distention. No thyroid enlargement, no tenderness.  LUNGS: Normal breath sounds bilaterally, no wheezing, rales, rhonchi. No use of accessory muscles of respiration.  CARDIOVASCULAR: S1, S2 normal. No murmurs, rubs, or gallops.  ABDOMEN: Soft, nontender, nondistended. Bowel sounds present. No organomegaly or mass.  EXTREMITIES: No pedal edema, cyanosis, or clubbing. + 2 pedal & radial pulses b/l.   NEUROLOGIC: Cranial nerves II through XII are intact. Motor strength upper extremities 5 over 5. Right lower extremity 5 over 5 and left lower extremity 4 over 5. PSYCHIATRIC: The patient is alert and oriented x 3. Good affect.  SKIN: No obvious rash, lesion, or ulcer.   LABORATORY PANEL:   CBC  Recent Labs Lab 04/02/15 1614  WBC 5.1  HGB 10.5*  HCT 31.0*  PLT 171   ------------------------------------------------------------------------------------------------------------------  Chemistries   Recent Labs Lab 04/01/15 1231 04/02/15 1614  NA 140 138  K 4.2 3.9  CL 104 105  CO2 28 26  GLUCOSE 195* 81  BUN 16 14  CREATININE 0.86 0.82  CALCIUM 9.8 9.4  AST 14  --   ALT 14  --   ALKPHOS 71  --   BILITOT 0.4  --    ------------------------------------------------------------------------------------------------------------------  Cardiac Enzymes  Recent Labs Lab 04/02/15 1614  TROPONINI <0.03   ------------------------------------------------------------------------------------------------------------------  RADIOLOGY:  Dg Chest 2 View  04/02/2015  CLINICAL DATA:  Acute onset chest and epigastric pain as well as shortness of  breath earlier today. Hypertension. Previous stroke. EXAM: CHEST  2 VIEW COMPARISON:  03/11/2015 FINDINGS: The heart size and mediastinal contours are within normal limits. Both lungs are clear. No evidence of pneumothorax or pleural effusion. Thoracic spine degenerative changes noted. IMPRESSION: Stable exam.  No active cardiopulmonary disease. Electronically Signed   By: Earle Gell M.D.   On: 04/02/2015 16:53   Ct Head Wo Contrast  04/02/2015  CLINICAL  DATA:  Severe hypertension with mild headache.  Recent CVA. EXAM: CT HEAD WITHOUT CONTRAST TECHNIQUE: Contiguous axial images were obtained from the base of the skull through the vertex without intravenous contrast. COMPARISON:  Head CT and brain MRI 03/11/2015 FINDINGS: The recent 1 cm right temporal infarct is not visualized by CT. No intracranial hemorrhage, mass effect, or midline shift. No hydrocephalus. The basilar cisterns are patent. Advanced for age chronic small vessel ischemia, stable since recent prior. Remote lacunar infarct in the right basal ganglia. No evidence of territorial infarct. No intracranial fluid collection. Calvarium is intact. Included paranasal sinuses and mastoid air cells are well aerated. IMPRESSION: No acute intracranial abnormality. Stable chronic change. Recent 1 cm right temporal infarct on MRI is not visualized by CT. Electronically Signed   By: Jeb Levering M.D.   On: 04/02/2015 18:23     IMPRESSION AND PLAN:   * Accelerated hypertension Continue patient's lisinopril and metoprolol. We'll add hydrochlorothiazide with first dose now. Patient was given Norvasc in the emergency room but she complains of some chronic ankle swelling which can get worse with Norvasc. Discontinue Use IV when necessary medications. If still uncontrolled metoprolol can be changed to labetalol.  * Insulin-dependent diabetes mellitus New home dose of Lantus along with sliding scale insulin. Diabetic diet.  * Recent CVA He is on  aspirin, Plavix, statin. Improving with physical therapy at home.  * Chronic Chest pain This was thought to be anxiety as per her outpatient physicians. This is unchanged. Will repeat 2 more sets of troponin and monitor on telemetry. Benefit from a stress test as outpatient.  * DVT prophylaxis with Lovenox   All the records are reviewed and case discussed with ED provider. Management plans discussed with the patient, family and they are in agreement.  CODE STATUS: FULL  TOTAL TIME TAKING CARE OF THIS PATIENT: 40 minutes.   Hillary Bow R M.D on 04/02/2015 at 8:49 PM  Between 7am to 6pm - Pager - [phone]  After 6pm go to [email or web address] - password [email or web address] [facility]  Office  [phone]  CC: Primary care physician; Elsie Stain, MD  Note: This dictation was prepared with Dragon dictation along with smaller phrase technology. Any transcriptional errors that result from this process are unintentional.

## 2015-04-02 NOTE — Telephone Encounter (Signed)
Will await ER notes.  Thanks.  

## 2015-04-02 NOTE — ED Provider Notes (Signed)
[facility] Emergency Department Provider Note  ____________________________________________  Time seen: Approximately 5:19 PM  I have reviewed the triage vital signs and the nursing notes.   HISTORY  Chief Complaint Chest Pain and Hypertension   HPI Elizabeth Rivera is a 65 y.o. female presents for evaluation of chest tightness and blood pressure. Patient reports she's had a high blood pressure and saw her primary care doctor yesterday who increased her Toprol dose, however today she started experience chest tightness and a blood pressure as high as 240 at home. Does not radiate, but is central and feels like a tight heaviness or as though someone is standing up boot upon her.  No back pain, fevers chills or trouble breathing.   Past Medical History  Diagnosis Date  . Thyroid disease     hypothyroid  . Anxiety   . Hyperlipidemia   . Hypertension   . Other chest pain     chest discomfort  . Obesity   . Neuropathy ([place])   . Other esophagitis     erosive esophagitis  . Other specified gastritis without mention of hemorrhage     erosvie gastritis  . Pain in limb     Right leg pain  . Other specified disorders of adrenal glands 07/2007    adrenal mass via of CT 1.4cm left Adrenal no change(Dr.Cope)   . Other abnormal blood chemistry   . Diverticulosis of colon (without mention of hemorrhage) 05/20/2009    Internal hemms (Dr. Vira Agar)  . Irritable bowel syndrome     history of  . Insomnia, unspecified   . Nervous breakdown 08/2002    [facility]  . GERD (gastroesophageal reflux disease)     reflux HH 09/01/2002//egd reflux Esoph. HH Gastritis nonbleeding erosive gastropathy Duodentitis 08/15/2006  . Hyperglycemia   . Candidal vaginitis   . Forearm fracture     2013, left  . Anemia   . Anemia of other chronic disease 09/01/2013  . Diabetes mellitus without complication ([place])   . Stroke ([place]) 03/2014  . Stroke [facility]) 03/2015    Patient Active  Problem List   Diagnosis Date Noted  . Cerebrovascular accident (CVA) due to occlusion of cerebral artery ([place])   . Petechial rash   . Venous hemorrhage   . Petechiae   . Absolute anemia   . Accelerated hypertension 03/11/2015  . Intractable nausea and vomiting 03/11/2015  . Anxiety 03/11/2015  . Stroke ([place]) 03/11/2015  . Hand pain 09/09/2014  . Psychomotor retardation 05/25/2014  . Syncope, cardiogenic 05/05/2014  . Tremor 05/05/2014  . Multiple falls 11/28/2013  . Elevated immunoglobulin A 09/02/2013  . Anemia of other chronic disease 09/01/2013  . Barrett's esophagus 08/18/2013  . Other malaise and fatigue 07/29/2013  . Diarrhea 03/18/2013  . DM (diabetes mellitus), type 2 with neurological complications ([place]) [id number]  . Bilateral leg pain 07/02/2012  . Urinary incontinence 09/25/2011  . Diaphoresis 12/21/2010  . Leg edema 05/11/2010  . B12 deficiency 05/11/2010  . BENIGN POSITIONAL VERTIGO 01/06/2009  . ABDOMINAL PAIN, LEFT UPPER QUADRANT 12/10/2008  . UNSPECIFIED VITAMIN D DEFICIENCY 09/07/2008  . ROSACEA 09/07/2008  . Hereditary and idiopathic peripheral neuropathy 08/24/2008  . Other chest pain 05/19/2008  . EROSIVE ESOPHAGITIS 02/03/2008  . EROSIVE GASTRITIS 02/03/2008  . Anxiety state 02/14/2007  . OBESITY 11/15/2006  . Extremity pain 09/28/2006  . ADRENAL MASS VIA CT 07/07/2006  . Hypothyroidism 07/05/2006  . HYPERLIPIDEMIA, MIXED 07/05/2006  . Essential hypertension 07/05/2006  . GERD  07/05/2006  . DIVERTICULOSIS, COLON 07/05/2006  . IBS 07/05/2006  . INSOMNIA 07/05/2006    Past Surgical History  Procedure Laterality Date  . Cholecystectomy  03/2004  . Abdominal hysterectomy  1995    Part Hyst BSO DUB ovarian cyst B9  . Vaginal delivery  1977  . Breast reduction surgery  1984  . Cystoscopy  01/08/2008    Former site of inflammation healed (Dr. Jacqlyn Larsen)  . Nsvd x1  1977  . Esophagogastroduodenoscopy  1. 09/01/02  2. 08/15/06    1. Reflux, HH  2.  Reflux esoph HH gaastritis nonbleeding erosive gastropathy duodenitis  . Colonoscopy  1. 08/15/06  2. 05/20/09    Int Hemms  2. Divertics Int Hemms (Dr. Vira Agar)  . Ct abd w & pelvis wo cm  6/09    CT Scan abd stable adrenal adenoma 1.4cm left adrenal no change (Dr. Jacqlyn Larsen)    Current Outpatient Rx  Name  Route  Sig  Dispense  Refill  . acetaminophen (TYLENOL) 325 MG tablet   Oral   Take 650 mg by mouth every 4 (four) hours as needed for mild pain, fever or headache.         . ALPRAZolam (XANAX) 0.25 MG tablet   Oral   Take 1 tablet (0.25 mg total) by mouth 2 (two) times daily.   30 tablet   0   . aspirin-acetaminophen-caffeine (EXCEDRIN MIGRAINE) 250-250-65 MG tablet   Oral   Take 1 tablet by mouth every 6 (six) hours as needed for headache.         Marland Kitchen atorvastatin (LIPITOR) 40 MG tablet   Oral   Take 1 tablet (40 mg total) by mouth daily at 6 PM.   30 tablet   0   . cholecalciferol (VITAMIN D) 1000 units tablet   Oral   Take 1,000 Units by mouth daily.         . clopidogrel (PLAVIX) 75 MG tablet   Oral   Take 1 tablet (75 mg total) by mouth daily.   30 tablet   0   . insulin glargine (LANTUS) 100 UNIT/ML injection   Subcutaneous   Inject 9 Units into the skin at bedtime.         Marland Kitchen levothyroxine (SYNTHROID, LEVOTHROID) 125 MCG tablet   Oral   Take 1 tablet (125 mcg total) by mouth daily before breakfast.   90 tablet   3   . lisinopril (PRINIVIL,ZESTRIL) 40 MG tablet   Oral   Take 1 tablet (40 mg total) by mouth daily.   30 tablet   0   . Melatonin 5 MG TABS   Oral   Take 5 mg by mouth at bedtime.         . metFORMIN (GLUCOPHAGE) 1000 MG tablet   Oral   Take 0.5 tablets (500 mg total) by mouth 2 (two) times daily with a meal.   90 tablet   3   . metoprolol succinate (TOPROL-XL) 100 MG 24 hr tablet   Oral   Take 150 mg by mouth daily. Take with or immediately following a meal.         . omeprazole (PRILOSEC) 40 MG capsule   Oral   Take 1  capsule (40 mg total) by mouth 2 (two) times daily.   180 capsule   3   . pregabalin (LYRICA) 150 MG capsule   Oral   Take 150 mg by mouth every morning.         Marland Kitchen  pregabalin (LYRICA) 75 MG capsule   Oral   Take 75 mg by mouth at bedtime.         . ranitidine (ZANTAC) 300 MG tablet   Oral   Take 1 tablet (300 mg total) by mouth daily.   90 tablet   3   . venlafaxine XR (EFFEXOR-XR) 150 MG 24 hr capsule   Oral   Take 1 capsule (150 mg total) by mouth 2 (two) times daily.   180 capsule   3   . vitamin B-12 (CYANOCOBALAMIN) 1000 MCG tablet   Oral   Take 1,000 mcg by mouth daily.          Marland Kitchen amLODipine (NORVASC) 10 MG tablet   Oral   Take 1 tablet (10 mg total) by mouth daily.   90 tablet   3   . ferrous sulfate ([employer] IRON) 325 (65 FE) MG tablet   Oral   Take 1 tablet (325 mg total) by mouth daily with breakfast.         . hydrochlorothiazide (HYDRODIURIL) 25 MG tablet   Oral   Take 1 tablet (25 mg total) by mouth daily.   90 tablet   3     Allergies Sulfa antibiotics  Family History  Problem Relation Age of Onset  . Hypertension Mother   . Hyperlipidemia Mother   . Stroke Mother     20  . Vision loss Father     vision problems  . Cancer Father     Colon   . Hyperlipidemia Sister   . Hypertension Sister   . Vision loss Sister     Social History Social History  Substance Use Topics  . Smoking status: Never Smoker   . Smokeless tobacco: Never Used  . Alcohol Use: No    Review of Systems Constitutional: No fever/chills Eyes: No visual changes. ENT: No sore throat. Cardiovascular: See history of present illness Respiratory: Denies shortness of breath. Gastrointestinal: No abdominal pain.  No nausea, no vomiting.  No diarrhea.  No constipation. Genitourinary: Negative for dysuria. Musculoskeletal: Negative for back pain. Skin: Negative for rash. Neurological: Mild throbbing headache. No focal weakness or numbness.  10-point ROS  otherwise negative.  ____________________________________________   PHYSICAL EXAM:  VITAL SIGNS: ED Triage Vitals  Enc Vitals Group     BP 04/02/15 1610 201/96 mmHg     Pulse Rate 04/02/15 1610 63     Resp 04/02/15 1610 13     Temp 04/02/15 1610 97.9 F (36.6 C)     Temp Source 04/02/15 1610 Oral     SpO2 04/02/15 1610 100 %     Weight 04/02/15 1610 138 lb (62.596 kg)     Height 04/02/15 1610 5' 2.5" (1.588 m)     Head Cir --      Peak Flow --      Pain Score 04/02/15 1611 0     Pain Loc --      Pain Edu? --      Excl. in [place]? --    Constitutional: Alert and oriented. Well appearing and in no acute distress. Eyes: Conjunctivae are normal. PERRL. EOMI. Head: Atraumatic. Nose: No congestion/rhinnorhea. Mouth/Throat: Mucous membranes are moist.  Oropharynx non-erythematous. Neck: No stridor.   Cardiovascular: Normal rate, regular rhythm. Grossly normal heart sounds.  Good peripheral circulation. Respiratory: Normal respiratory effort.  No retractions. Lungs CTAB. Gastrointestinal: Soft and nontender. No distention. No abdominal bruits. No CVA tenderness. Musculoskeletal: No lower extremity tenderness nor edema.  No joint effusions. Neurologic:  Normal speech and language. No gross focal neurologic deficits are appreciated. No gait instability. Skin:  Skin is warm, dry and intact. No rash noted. Psychiatric: Mood and affect are normal. Speech and behavior are normal.  ____________________________________________   LABS (all labs ordered are listed, but only abnormal results are displayed)  Labs Reviewed  CBC - Abnormal; Notable for the following:    RBC 3.71 (*)    Hemoglobin 10.5 (*)    HCT 31.0 (*)    All other components within normal limits  GLUCOSE, CAPILLARY - Abnormal; Notable for the following:    Glucose-Capillary 175 (*)    All other components within normal limits  GLUCOSE, CAPILLARY - Abnormal; Notable for the following:    Glucose-Capillary 121 (*)     All other components within normal limits  BASIC METABOLIC PANEL  TROPONIN I  GLUCOSE, CAPILLARY  TROPONIN I  TROPONIN I  CBG MONITORING, ED   ____________________________________________  EKG   ____________________________________________  RADIOLOGY  CT Head Wo Contrast (Final result) Result time: 04/02/15 18:23:04   Final result by Rad Results In Interface (04/02/15 18:23:04)   Narrative:   CLINICAL DATA: Severe hypertension with mild headache. Recent CVA.  EXAM: CT HEAD WITHOUT CONTRAST  TECHNIQUE: Contiguous axial images were obtained from the base of the skull through the vertex without intravenous contrast.  COMPARISON: Head CT and brain MRI 03/11/2015  FINDINGS: The recent 1 cm right temporal infarct is not visualized by CT. No intracranial hemorrhage, mass effect, or midline shift. No hydrocephalus. The basilar cisterns are patent. Advanced for age chronic small vessel ischemia, stable since recent prior. Remote lacunar infarct in the right basal ganglia. No evidence of territorial infarct. No intracranial fluid collection. Calvarium is intact. Included paranasal sinuses and mastoid air cells are well aerated.  IMPRESSION: No acute intracranial abnormality. Stable chronic change. Recent 1 cm right temporal infarct on MRI is not visualized by CT.   Electronically Signed By: Jeb Levering M.D. On: 04/02/2015 18:23          DG Chest 2 View (Final result) Result time: 04/02/15 16:53:44   Final result by Rad Results In Interface (04/02/15 16:53:44)   Narrative:   CLINICAL DATA: Acute onset chest and epigastric pain as well as shortness of breath earlier today. Hypertension. Previous stroke.  EXAM: CHEST 2 VIEW  COMPARISON: 03/11/2015  FINDINGS: The heart size and mediastinal contours are within normal limits. Both lungs are clear. No evidence of pneumothorax or pleural effusion. Thoracic spine degenerative changes  noted.  IMPRESSION: Stable exam. No active cardiopulmonary disease.   Electronically Signed By: Earle Gell M.D. On: 04/02/2015 16:53    ____________________________________________   PROCEDURES  Procedure(s) performed: None  Critical Care performed: No  ____________________________________________   INITIAL IMPRESSION / ASSESSMENT AND PLAN / ED COURSE  Pertinent labs & imaging results that were available during my care of the patient were reviewed by me and considered in my medical decision making (see chart for details).  Patient presents for chest pressure with associated significant hypertension. The patient does not wish for nitroglycerin due to headaches associated with it. She is a 42 on 2 antihypertensives. Her EKG demonstrates no new ischemic abnormality, however I am quite concerned about the severity of her hypertension in the setting of chest pain. No signs or symptoms of acute dissection, pulmonary embolism, and her initial EKG and troponin did not demonstrate clear evidence of acute coronary syndrome.  Find primarily likely suffering from hypertensive  urgency, however in the setting of a recent stroke we did obtain CT of the head to exclude intracranial hemorrhage is a possibility for causes of hypertension. Her neurologic exam is at baseline with some mild left-sided weakness which is ongoing.  ----------------------------------------- 8:03 PM on 04/02/2015 -----------------------------------------  Discussed case and care with Dr. Stanford Breed of cardiology who advised adding Norvasc. After giving Norvasc no change in blood pressure, patient continues to note mild chest tightness. We will admit her to the hospital for observation for chest pain as well as blood pressure management. Ongoing care and additional assessment per the hospitalist service. ____________________________________________   FINAL CLINICAL IMPRESSION(S) / ED DIAGNOSES  Final diagnoses:   Chest pain, moderate coronary artery risk  Hypertensive urgency      Delman Kitten, MD 04/09/15 [phone]

## 2015-04-02 NOTE — Telephone Encounter (Signed)
[facility] Patient Name: Elizabeth Rivera DOB: 28-Apr-1950 Initial Comment [name] states she was seen in the office yesterday for blood pressure being high. She was given a higher dose of medication and it hasn't helped. It is currently 168/102 Nurse Assessment Nurse: Markus Daft, RN, Sherre Poot Date/Time (Eastern Time): 04/02/2015 2:56:00 PM Confirm and document reason for call. If symptomatic, describe symptoms. You must click the next button to save text entered. ---[name] states her BP 168/102, HR 69 just now. She is having some dizziness. Waited 30 min. before getting up again and dizziness went away.  Also, c/o pressure in chest. She has felt it for 2 hrs after taking her shower. - She was seen in the office yesterday for HTN. Taking Metoprolol and Linisopril. Increased dosage has not helped. Has the patient traveled out of the country within the last 30 days? ---Not Applicable Does the patient have any new or worsening symptoms? ---Yes Will a triage be completed? ---Yes Related visit to physician within the last 2 weeks? ---Yes Does the PT have any chronic conditions? (i.e. diabetes, asthma, etc.) ---Yes List chronic conditions. ---CVA - 2 - 3 wks. ago; HTN; diabetic Is this a behavioral health or substance abuse call? ---No Guidelines Guideline Title Affirmed Question Affirmed Notes Chest Pain [1] Chest pain lasts > 5 minutes AND [2] described as crushing, pressure-like, or heavy Final Disposition User Call EMS 911 Now Markus Daft, RN, [place] Disagree/Comply: Comply 911 f/u - EMS is on their way.

## 2015-04-02 NOTE — ED Notes (Signed)
MD Quale at bedside. 

## 2015-04-02 NOTE — ED Notes (Signed)
Pt given meal tray at this time, sitting up in bed eating and tolerating well. NAD noted.

## 2015-04-02 NOTE — ED Notes (Signed)
Pt to ED from home via EMS with chest pain and hypertension. Per EMS three BP attempts all in the 200's. Pt with recent changes to BP meds. Upon arrival, pt AAOx4, BP 201/96, denies chest pain at this time. Pt in NAD noted at this time.

## 2015-04-02 NOTE — ED Notes (Signed)
Patient transported to XRAY 

## 2015-04-03 LAB — TROPONIN I

## 2015-04-03 LAB — GLUCOSE, CAPILLARY: Glucose-Capillary: 121 mg/dL — ABNORMAL HIGH (ref 65–99)

## 2015-04-03 MED ORDER — BUTALBITAL-APAP-CAFFEINE 50-325-40 MG PO TABS: 1.0000 | ORAL_TABLET | Freq: Four times a day (QID) | ORAL | Status: DC | PRN

## 2015-04-03 MED ORDER — AMLODIPINE BESYLATE 10 MG PO TABS
10.0000 mg | ORAL_TABLET | Freq: Every day | ORAL | Status: DC
  Administered 2015-04-03: 10 mg via ORAL
  Filled 2015-04-03: qty 1

## 2015-04-03 MED ORDER — AMLODIPINE BESYLATE 10 MG PO TABS: 10.0000 mg | ORAL_TABLET | Freq: Every day | ORAL | Status: DC

## 2015-04-03 MED ORDER — HYDROCHLOROTHIAZIDE 25 MG PO TABS: 25.0000 mg | ORAL_TABLET | Freq: Every day | ORAL | Status: DC

## 2015-04-03 MED ORDER — POLYETHYLENE GLYCOL 3350 17 G PO PACK: 17.0000 g | PACK | Freq: Every day | ORAL | Status: DC | PRN

## 2015-04-03 NOTE — Progress Notes (Signed)
A & O. Pt complained of a HA and received meds. No other compliants. Husband at the bedside. IV and tele removed. Prescriptions given to pt. Discharge instructions given to pt. Pt has no further concerns at this time.

## 2015-04-03 NOTE — Discharge Instructions (Signed)
Activity as tolerated Diet heart healthy and diabetic Follow-up with primary care physician in a week at PCP to refer the patient to outpatient cardiology in 2-4 weeks for possible outpatient stress test Outpatient follow-up with primary neurology of the patient as  recommended over in 2 weeks Outpatient follow-up with psychiatrist and  therapist in 2-4 weeks

## 2015-04-03 NOTE — Care Management Obs Status (Signed)
[place] NOTIFICATION   Patient Details  Name: Elizabeth Rivera MRN: [id number] Date of Birth: [birth year]   Medicare Observation Status Notification Given:  Yes (spouse signed)    Ival Bible, RN 04/03/2015, 11:22 AM

## 2015-04-03 NOTE — Discharge Summary (Signed)
[place] [facility] [place] NAME: Elizabeth Rivera    MR#:  [id number]  DATE OF BIRTH:  Mar 24, 1950  DATE OF ADMISSION:  04/02/2015 ADMITTING PHYSICIAN: Hillary Bow, MD  DATE OF DISCHARGE:04/03/15  PRIMARY CARE PHYSICIAN: Elsie Stain, MD    ADMISSION DIAGNOSIS:  Hypertensive urgency [I16.0] Chest pain, moderate coronary artery risk [R07.9]  DISCHARGE DIAGNOSIS:  Active Problems:   Accelerated hypertension migraine headache and tension headache  SECONDARY DIAGNOSIS:   Past Medical History  Diagnosis Date  . Thyroid disease     hypothyroid  . Anxiety   . Hyperlipidemia   . Hypertension   . Other chest pain     chest discomfort  . Obesity   . Neuropathy ([place])   . Other esophagitis     erosive esophagitis  . Other specified gastritis without mention of hemorrhage     erosvie gastritis  . Pain in limb     Right leg pain  . Other specified disorders of adrenal glands 07/2007    adrenal mass via of CT 1.4cm left Adrenal no change(Dr.Cope)   . Other abnormal blood chemistry   . Diverticulosis of colon (without mention of hemorrhage) 05/20/2009    Internal hemms (Dr. Vira Agar)  . Irritable bowel syndrome     history of  . Insomnia, unspecified   . Nervous breakdown 08/2002    [facility]  . GERD (gastroesophageal reflux disease)     reflux HH 09/01/2002//egd reflux Esoph. HH Gastritis nonbleeding erosive gastropathy Duodentitis 08/15/2006  . Hyperglycemia   . Candidal vaginitis   . Forearm fracture     2013, left  . Anemia   . Anemia of other chronic disease 09/01/2013  . Diabetes mellitus without complication ([place])   . Stroke ([place]) 03/2014  . Stroke [facility]) 03/2015    HOSPITAL COURSE:   * Accelerated hypertension-clinically improving Continue patient's lisinopril and metoprolol.  Added hydrochlorothiazide and amlodipine for elevated blood pressure for better blood pressure control  *Chronic headache secondary to  tension and migraine headaches Fioricet as needed Needs better control of the blood pressure Outpatient follow-up with neurology as recommended CT head is negative  * Insulin-dependent diabetes mellitus New home dose of Lantus along with sliding scale insulin. Diabetic diet.  * Recent CVA Continue aspirin, Plavix, statin. Improving with physical therapy at home.  * Chronic Chest pain Ruled out acute MI with negative troponins This was thought to be anxiety as per her outpatient physicians. This is unchanged. Recommended outpatient stress test, PCP to consider referring the patient to outpatient cardiology  * DVT prophylaxis with Lovenox  DISCHARGE CONDITIONS:   Fair  CONSULTS OBTAINED:      PROCEDURES none  DRUG ALLERGIES:   Allergies  Allergen Reactions  . Sulfa Antibiotics Rash    DISCHARGE MEDICATIONS:   Discharge Medication List as of 04/03/2015  1:11 PM    START taking these medications   Details  amLODipine (NORVASC) 10 MG tablet Take 1 tablet (10 mg total) by mouth daily., Starting 04/03/2015, Until Discontinued, Print    butalbital-acetaminophen-caffeine (FIORICET, ESGIC) 50-325-40 MG tablet Take 1 tablet by mouth every 6 (six) hours as needed for headache or migraine., Starting 04/03/2015, Until Discontinued, Print    hydrochlorothiazide (HYDRODIURIL) 25 MG tablet Take 1 tablet (25 mg total) by mouth daily., Starting 04/03/2015, Until Discontinued, Print    polyethylene glycol (MIRALAX / GLYCOLAX) packet Take 17 g by mouth daily as needed for mild constipation., Starting 04/03/2015, Until Discontinued,  Print      CONTINUE these medications which have NOT CHANGED   Details  acetaminophen (TYLENOL) 325 MG tablet Take 650 mg by mouth every 4 (four) hours as needed for mild pain, fever or headache., Until Discontinued, Historical Med    ALPRAZolam (XANAX) 0.25 MG tablet Take 1 tablet (0.25 mg total) by mouth 2 (two) times daily., Starting 03/15/2015, Until  Discontinued, Print    aspirin-acetaminophen-caffeine (EXCEDRIN MIGRAINE) [phone] MG tablet Take 1 tablet by mouth every 6 (six) hours as needed for headache., Until Discontinued, Historical Med    atorvastatin (LIPITOR) 40 MG tablet Take 1 tablet (40 mg total) by mouth daily at 6 PM., Starting 03/15/2015, Until Discontinued, Normal    cholecalciferol (VITAMIN D) 1000 units tablet Take 1,000 Units by mouth daily., Until Discontinued, Historical Med    clopidogrel (PLAVIX) 75 MG tablet Take 1 tablet (75 mg total) by mouth daily., Starting 03/15/2015, Until Discontinued, Normal    ferrous sulfate 325 (65 FE) MG tablet Take 1 tablet (325 mg total) by mouth daily with breakfast., Starting 08/03/2013, Until Discontinued, No Print    insulin glargine (LANTUS) 100 UNIT/ML injection Inject 9 Units into the skin at bedtime., Until Discontinued, Historical Med    levothyroxine (SYNTHROID, LEVOTHROID) 125 MCG tablet Take 1 tablet (125 mcg total) by mouth daily before breakfast., Starting 03/05/2015, Until Discontinued, Normal    lisinopril (PRINIVIL,ZESTRIL) 40 MG tablet Take 1 tablet (40 mg total) by mouth daily., Starting 03/16/2015, Until Discontinued, Normal    Melatonin 5 MG TABS Take 5 mg by mouth at bedtime., Until Discontinued, Historical Med    metFORMIN (GLUCOPHAGE) 1000 MG tablet Take 0.5 tablets (500 mg total) by mouth 2 (two) times daily with a meal., Starting 02/09/2015, Until Discontinued, Normal    metoprolol succinate (TOPROL-XL) 100 MG 24 hr tablet Take 150 mg by mouth daily. Take with or immediately following a meal., Until Discontinued, Historical Med    omeprazole (PRILOSEC) 40 MG capsule Take 1 capsule (40 mg total) by mouth 2 (two) times daily., Starting 03/04/2015, Until Discontinued, Normal    !! pregabalin (LYRICA) 150 MG capsule Take 150 mg by mouth every morning., Until Discontinued, Historical Med    !! pregabalin (LYRICA) 75 MG capsule Take 75 mg by mouth at bedtime., Until  Discontinued, Historical Med    ranitidine (ZANTAC) 300 MG tablet Take 1 tablet (300 mg total) by mouth daily., Starting 01/08/2015, Until Discontinued, Normal    venlafaxine XR (EFFEXOR-XR) 150 MG 24 hr capsule Take 1 capsule (150 mg total) by mouth 2 (two) times daily., Starting 01/08/2015, Until Discontinued, Normal    vitamin B-12 (CYANOCOBALAMIN) 1000 MCG tablet Take 1,000 mcg by mouth daily. , Until Discontinued, Historical Med    feeding supplement, GLUCERNA SHAKE, (GLUCERNA SHAKE) LIQD Take 237 mLs by mouth 2 (two) times daily between meals., Starting 03/15/2015, Until Discontinued, Normal     !! - Potential duplicate medications found. Please discuss with provider.       DISCHARGE INSTRUCTIONS:   Activity as tolerated Diet heart healthy and diabetic Follow-up with primary care physician in a week at PCP to refer the patient to outpatient cardiology in 2-4 weeks for possible outpatient stress test Outpatient follow-up with primary neurology of the patient as  recommended over in 2 weeks Outpatient follow-up with psychiatrist and  therapist in 2-4 weeks   DIET:  Cardiac diet,diabetic diet   DISCHARGE CONDITION:  Fair  ACTIVITY:  Activity as tolerated  OXYGEN:  Home Oxygen: No.  Oxygen Delivery: room air  DISCHARGE LOCATION:  home   If you experience worsening of your admission symptoms, develop shortness of breath, life threatening emergency, suicidal or homicidal thoughts you must seek medical attention immediately by calling 911 or calling your MD immediately  if symptoms less severe.  You Must read complete instructions/literature along with all the possible adverse reactions/side effects for all the Medicines you take and that have been prescribed to you. Take any new Medicines after you have completely understood and accpet all the possible adverse reactions/side effects.   Please note  You were cared for by a hospitalist during your hospital stay. If you have  any questions about your discharge medications or the care you received while you were in the hospital after you are discharged, you can call the unit and asked to speak with the hospitalist on call if the hospitalist that took care of you is not available. Once you are discharged, your primary care physician will handle any further medical issues. Please note that NO REFILLS for any discharge medications will be authorized once you are discharged, as it is imperative that you return to your primary care physician (or establish a relationship with a primary care physician if you do not have one) for your aftercare needs so that they can reassess your need for medications and monitor your lab values.     Today  Chief Complaint  Patient presents with  . Chest Pain  . Hypertension   Patient has chronic left-sided chest pain which is reproducible. Denies any shortness of breath. Headache is better. Sees neurology as an outpatient for her headache and recent stroke. Admits tension and stress in her life  ROS:  CONSTITUTIONAL: Denies fevers, chills. Denies any fatigue, weakness.  EYES: Denies blurry vision, double vision, eye pain. EARS, NOSE, THROAT: Denies tinnitus, ear pain, hearing loss. RESPIRATORY: Denies cough, wheeze, shortness of breath.  CARDIOVASCULAR: Has chronic chest pain, denies palpitations, edema.  GASTROINTESTINAL: Denies nausea, vomiting, diarrhea, abdominal pain. Denies bright red blood per rectum. GENITOURINARY: Denies dysuria, hematuria. ENDOCRINE: Denies nocturia or thyroid problems. HEMATOLOGIC AND LYMPHATIC: Denies easy bruising or bleeding. SKIN: Denies rash or lesion. MUSCULOSKELETAL: Denies pain in neck, back, shoulder, knees, hips or arthritic symptoms.  NEUROLOGIC: Denies paralysis, paresthesias.  PSYCHIATRIC: Denies anxiety or depressive symptoms.   VITAL SIGNS:  Blood pressure 140/99, pulse 76, temperature 98.4 F (36.9 C), temperature source Oral, resp. rate  20, height 5' 2.5" (1.588 m), weight 62.052 kg (136 lb 12.8 oz), SpO2 99 %.  I/O:    Intake/Output Summary (Last 24 hours) at 04/03/15 1324 Last data filed at 04/03/15 1012  Gross per 24 hour  Intake    240 ml  Output   1100 ml  Net   -860 ml    PHYSICAL EXAMINATION:  GENERAL:  65 y.o.-year-old patient lying in the bed with no acute distress.  EYES: Pupils equal, round, reactive to light and accommodation. No scleral icterus. Extraocular muscles intact.  HEENT: Head atraumatic, normocephalic. Oropharynx and nasopharynx clear.  NECK:  Supple, no jugular venous distention. No thyroid enlargement, no tenderness.  LUNGS: Normal breath sounds bilaterally, no wheezing, rales,rhonchi or crepitation. No use of accessory muscles of respiration.  CARDIOVASCULAR: S1, S2 normal. No murmurs, rubs, or gallops. Reproducible anterior left-sided chest wall tenderness ABDOMEN: Soft, non-tender, non-distended. Bowel sounds present. No organomegaly or mass.  EXTREMITIES: No pedal edema, cyanosis, or clubbing.  NEUROLOGIC: Cranial nerves II through XII are intact. Muscle strength 5/5 in all  extremities. Sensation intact. Gait not checked.  PSYCHIATRIC: The patient is alert and oriented x 3.  SKIN: No obvious rash, lesion, or ulcer.   DATA REVIEW:   CBC  Recent Labs Lab 04/02/15 1614  WBC 5.1  HGB 10.5*  HCT 31.0*  PLT 171    Chemistries   Recent Labs Lab 04/01/15 1231 04/02/15 1614  NA 140 138  K 4.2 3.9  CL 104 105  CO2 28 26  GLUCOSE 195* 81  BUN 16 14  CREATININE 0.86 0.82  CALCIUM 9.8 9.4  AST 14  --   ALT 14  --   ALKPHOS 71  --   BILITOT 0.4  --     Cardiac Enzymes  Recent Labs Lab 04/03/15 0445  TROPONINI <0.03    Microbiology Results  Results for orders placed or performed during the hospital encounter of 03/11/15  Urine culture     Status: None   Collection Time: 03/11/15 12:27 PM  Result Value Ref Range Status   Specimen Description URINE, RANDOM  Final    Special Requests Normal  Final   Culture MULTIPLE SPECIES PRESENT, SUGGEST RECOLLECTION  Final   Report Status 03/13/2015 FINAL  Final    RADIOLOGY:  Dg Chest 2 View  04/02/2015  CLINICAL DATA:  Acute onset chest and epigastric pain as well as shortness of breath earlier today. Hypertension. Previous stroke. EXAM: CHEST  2 VIEW COMPARISON:  03/11/2015 FINDINGS: The heart size and mediastinal contours are within normal limits. Both lungs are clear. No evidence of pneumothorax or pleural effusion. Thoracic spine degenerative changes noted. IMPRESSION: Stable exam.  No active cardiopulmonary disease. Electronically Signed   By: Earle Gell M.D.   On: 04/02/2015 16:53   Ct Head Wo Contrast  04/02/2015  CLINICAL DATA:  Severe hypertension with mild headache.  Recent CVA. EXAM: CT HEAD WITHOUT CONTRAST TECHNIQUE: Contiguous axial images were obtained from the base of the skull through the vertex without intravenous contrast. COMPARISON:  Head CT and brain MRI 03/11/2015 FINDINGS: The recent 1 cm right temporal infarct is not visualized by CT. No intracranial hemorrhage, mass effect, or midline shift. No hydrocephalus. The basilar cisterns are patent. Advanced for age chronic small vessel ischemia, stable since recent prior. Remote lacunar infarct in the right basal ganglia. No evidence of territorial infarct. No intracranial fluid collection. Calvarium is intact. Included paranasal sinuses and mastoid air cells are well aerated. IMPRESSION: No acute intracranial abnormality. Stable chronic change. Recent 1 cm right temporal infarct on MRI is not visualized by CT. Electronically Signed   By: Jeb Levering M.D.   On: 04/02/2015 18:23    EKG:   Orders placed or performed during the hospital encounter of 04/02/15  . ED EKG within 10 minutes  . ED EKG within 10 minutes      Management plans discussed with the patient, family and they are in agreement.  CODE STATUS:     Code Status Orders         Start     Ordered   04/02/15 2032  Full code   Continuous     04/02/15 2032    Code Status History    Date Active Date Inactive Code Status Order ID Comments User Context   03/11/2015 11:36 PM 03/16/2015  8:50 PM Full Code [id number]  Lance Coon, MD Inpatient      TOTAL TIME TAKING CARE OF THIS PATIENT: 45 minutes.    @[facility] @  on 04/03/2015 at 1:24 PM  Between  7am to 6pm - Pager - [phone]  After 6pm go to [email or web address] - password [email or web address] [facility]  Office  [phone]  CC: Primary care physician; Elsie Stain, MD

## 2015-04-05 ENCOUNTER — Ambulatory Visit: Payer: PPO

## 2015-04-05 ENCOUNTER — Telehealth: Payer: Self-pay | Admitting: *Deleted

## 2015-04-05 VITALS — BP 113/78

## 2015-04-05 DIAGNOSIS — R2681 Unsteadiness on feet: Secondary | ICD-10-CM

## 2015-04-05 NOTE — Telephone Encounter (Signed)
Transitional care call attempted.  Left message for patient to return call. 

## 2015-04-05 NOTE — Telephone Encounter (Signed)
Transition Care Management Follow-up Telephone Call   Date discharged? 04/03/15   How have you been since you were released from the hospital? Feeling well, BP remains controlled.   Do you understand why you were in the hospital? yes   Do you understand the discharge instructions? yes   Where were you discharged to? home   Items Reviewed:  Medications reviewed: yes  Allergies reviewed: yes  Dietary changes reviewed: diabetic/cardiac diet  Referrals reviewed: neurology - patient already established, will call for follow up   Functional Questionnaire:   Activities of Daily Living (ADLs):   She states they are independent in the following: ambulation, bathing and hygiene, feeding, continence, grooming, toileting and dressing States they require assistance with the following: none   Any transportation issues/concerns?: no   Any patient concerns? no   Confirmed importance and date/time of follow-up visits scheduled yes, 04/08/15 @ 1215  Provider Appointment booked with G. Renford Dills, MD  Confirmed with patient if condition begins to worsen call PCP or go to the ER.  Patient was given the office number and encouraged to call back with question or concerns.  : yes

## 2015-04-05 NOTE — Telephone Encounter (Signed)
Debbie at [facility] left a voicemail stating that patient was discharge over the weekend from the hospital. Jackelyn Poling requested a call back giving verbal okay for Physical therapy. Debbie requested a call back as soon as possible.

## 2015-04-05 NOTE — Therapy (Signed)
[place] [facility]IN [facility] SERVICES [address] [place], [place], [address] Phone: [phone]   Fax:  [phone]  Physical Therapy Treatment  Patient Details  Name: Elizabeth Rivera MRN: [id number] Date of Birth: 08-13-1950 Referring Provider: Dr. Ouida Sills  Encounter Date: 04/05/2015      PT End of Session - 04/05/15 1341    Visit Number 3   Number of Visits 17   Date for PT Re-Evaluation 04/26/15   Authorization Type 3/10   Equipment Utilized During Treatment Gait belt   Activity Tolerance Patient tolerated treatment well   Behavior During Therapy Flat affect  motivated      Past Medical History  Diagnosis Date  . Thyroid disease     hypothyroid  . Anxiety   . Hyperlipidemia   . Hypertension   . Other chest pain     chest discomfort  . Obesity   . Neuropathy ([place])   . Other esophagitis     erosive esophagitis  . Other specified gastritis without mention of hemorrhage     erosvie gastritis  . Pain in limb     Right leg pain  . Other specified disorders of adrenal glands 07/2007    adrenal mass via of CT 1.4cm left Adrenal no change(Dr.Cope)   . Other abnormal blood chemistry   . Diverticulosis of colon (without mention of hemorrhage) 05/20/2009    Internal hemms (Dr. Vira Agar)  . Irritable bowel syndrome     history of  . Insomnia, unspecified   . Nervous breakdown 08/2002    [facility]  . GERD (gastroesophageal reflux disease)     reflux HH 09/01/2002//egd reflux Esoph. HH Gastritis nonbleeding erosive gastropathy Duodentitis 08/15/2006  . Hyperglycemia   . Candidal vaginitis   . Forearm fracture     2013, left  . Anemia   . Anemia of other chronic disease 09/01/2013  . Diabetes mellitus without complication ([place])   . Stroke ([place]) 03/2014  . Stroke [facility]) 03/2015    Past Surgical History  Procedure Laterality Date  . Cholecystectomy  03/2004  . Abdominal hysterectomy  1995    Part Hyst BSO DUB ovarian cyst B9  .  Vaginal delivery  1977  . Breast reduction surgery  1984  . Cystoscopy  01/08/2008    Former site of inflammation healed (Dr. Jacqlyn Larsen)  . Nsvd x1  1977  . Esophagogastroduodenoscopy  1. 09/01/02  2. 08/15/06    1. Reflux, HH  2. Reflux esoph HH gaastritis nonbleeding erosive gastropathy duodenitis  . Colonoscopy  1. 08/15/06  2. 05/20/09    Int Hemms  2. Divertics Int Hemms (Dr. Vira Agar)  . Ct abd w & pelvis wo cm  6/09    CT Scan abd stable adrenal adenoma 1.4cm left adrenal no change (Dr. Jacqlyn Larsen)    Filed Vitals:   04/05/15 1341  BP: 113/78    Visit Diagnosis:  Unsteadiness on feet      Subjective Assessment - 04/05/15 1340    Subjective pt reports she had to go to the ER due to HTN over the weekend. she reports they added some HTN meds which is helping.    Patient Stated Goals "get back my strength, and be able to hold my balance."   Currently in Pain? No/denies      NMR: leg press 90lbs 3x10 Standing on AIREX, NBOS EO/EC 10s x 10 Toe taps onto BOSU , standing on AIREX 2x10 Gait training:x 30 min Over ground gait training  with SPC, initially needing max cues then reduced to min cues for proper sequencing, cues for step length and for upright gaze                                PT Long Term Goals - 03/29/15 1552    PT LONG TERM GOAL #1   Title pt will perform TUG using LRAD in <12 s demonstrating reduced risk of falls.   Time 6   Period Weeks   Status New   PT LONG TERM GOAL #2   Title pt will score 52 or greater on the berg balance test, reducing fall risk.    Time 6   Period Weeks   Status New   PT LONG TERM GOAL #3   Title pt will ambulate using LRAD at least 0.38m/s for normalized home mobility.    Time 6   Period Weeks   Status New   PT LONG TERM GOAL #4   Title pt will ambulate x 14ft using SPC safely to return to PLOF.    Time 6   Period Weeks   Status New               Plan - 04/05/15 1343    Clinical Impression Statement  pts vitals were stable during todays visit. focused majority of visit today on gait training with SPC. pt did well, but has difficutly sequencing at times. She has good motivation. will assess carry over into next session. pt advised to keep using RW at home, as she is not yet safe with the cane.    Pt will benefit from skilled therapeutic intervention in order to improve on the following deficits Abnormal gait;Decreased activity tolerance;Decreased balance;Decreased strength;Impaired sensation;Decreased endurance   Rehab Potential Good   Clinical Impairments Affecting Rehab Potential HTN, DM, hx of anxiety/ depression, previous CVA   PT Frequency 2x / week   PT Duration 6 weeks   PT Treatment/Interventions ADLs/Self Care Home Management;Aquatic Therapy;Balance training;Therapeutic exercise;Therapeutic activities;Gait training;Manual techniques   PT Next Visit Plan progress hip strengthening HEP   Consulted and Agree with Plan of Care Patient;Family member/caregiver        Problem List Patient Active Problem List   Diagnosis Date Noted  . Cerebrovascular accident (CVA) due to occlusion of cerebral artery ([place])   . Petechial rash   . Venous hemorrhage   . Petechiae   . Absolute anemia   . Accelerated hypertension 03/11/2015  . Intractable nausea and vomiting 03/11/2015  . Anxiety 03/11/2015  . Stroke ([place]) 03/11/2015  . Hand pain 09/09/2014  . Psychomotor retardation 05/25/2014  . Syncope, cardiogenic 05/05/2014  . Tremor 05/05/2014  . Multiple falls 11/28/2013  . Elevated immunoglobulin A 09/02/2013  . Anemia of other chronic disease 09/01/2013  . Barrett's esophagus 08/18/2013  . Other malaise and fatigue 07/29/2013  . Diarrhea 03/18/2013  . DM (diabetes mellitus), type 2 with neurological complications ([place]) [id number]  . Bilateral leg pain 07/02/2012  . Urinary incontinence 09/25/2011  . Diaphoresis 12/21/2010  . Leg edema 05/11/2010  . B12 deficiency 05/11/2010  . BENIGN  POSITIONAL VERTIGO 01/06/2009  . ABDOMINAL PAIN, LEFT UPPER QUADRANT 12/10/2008  . UNSPECIFIED VITAMIN D DEFICIENCY 09/07/2008  . ROSACEA 09/07/2008  . NEUROPATHY 08/24/2008  . Other chest pain 05/19/2008  . EROSIVE ESOPHAGITIS 02/03/2008  . EROSIVE GASTRITIS 02/03/2008  . Anxiety state 02/14/2007  . OBESITY 11/15/2006  . Extremity pain 09/28/2006  .  ADRENAL MASS VIA CT 07/07/2006  . Hypothyroidism 07/05/2006  . HYPERLIPIDEMIA, MIXED 07/05/2006  . Essential hypertension 07/05/2006  . GERD 07/05/2006  . DIVERTICULOSIS, COLON 07/05/2006  . IBS 07/05/2006  . INSOMNIA 07/05/2006  Gorden Harms. Avrianna Smart, PT, DPT [phone]   Jashayla Glatfelter 04/05/2015, 1:45 PM  [place] MAIN [facility] SERVICES [address] [place], [place], [address] Phone: [phone]   Fax:  [phone]  Name: Elizabeth Rivera MRN: [id number] Date of Birth: March 27, 1950

## 2015-04-06 ENCOUNTER — Encounter: Payer: Self-pay | Admitting: *Deleted

## 2015-04-06 NOTE — Telephone Encounter (Signed)
Verbal order given to Jackelyn Poling who requested an order be faxed to [phone]

## 2015-04-06 NOTE — Telephone Encounter (Signed)
Order faxed.

## 2015-04-06 NOTE — Telephone Encounter (Signed)
Please give the order.  Thanks.   

## 2015-04-07 ENCOUNTER — Ambulatory Visit: Payer: PPO | Attending: Internal Medicine

## 2015-04-07 VITALS — BP 130/77

## 2015-04-07 DIAGNOSIS — R2681 Unsteadiness on feet: Secondary | ICD-10-CM

## 2015-04-07 NOTE — Therapy (Signed)
[place] MAIN [facility] SERVICES [address] [place], [place], [address] Phone: [phone]   Fax:  [phone]  Physical Therapy Treatment  Patient Details  Name: Elizabeth Rivera MRN: [id number] Date of Birth: Apr 07, 1950 Referring Provider: Dr. Ouida Sills  Encounter Date: 04/07/2015      PT End of Session - 04/07/15 1023    Visit Number 4   Number of Visits 17   Date for PT Re-Evaluation 04/26/15   Authorization Type 4/10   Equipment Utilized During Treatment Gait belt   Activity Tolerance Patient tolerated treatment well   Behavior During Therapy Flat affect  motivated      Past Medical History  Diagnosis Date  . Thyroid disease     hypothyroid  . Anxiety   . Hyperlipidemia   . Hypertension   . Other chest pain     chest discomfort  . Obesity   . Neuropathy ([place])   . Other esophagitis     erosive esophagitis  . Other specified gastritis without mention of hemorrhage     erosvie gastritis  . Pain in limb     Right leg pain  . Other specified disorders of adrenal glands 07/2007    adrenal mass via of CT 1.4cm left Adrenal no change(Dr.Cope)   . Other abnormal blood chemistry   . Diverticulosis of colon (without mention of hemorrhage) 05/20/2009    Internal hemms (Dr. Vira Agar)  . Irritable bowel syndrome     history of  . Insomnia, unspecified   . Nervous breakdown 08/2002    [facility]  . GERD (gastroesophageal reflux disease)     reflux HH 09/01/2002//egd reflux Esoph. HH Gastritis nonbleeding erosive gastropathy Duodentitis 08/15/2006  . Hyperglycemia   . Candidal vaginitis   . Forearm fracture     2013, left  . Anemia   . Anemia of other chronic disease 09/01/2013  . Diabetes mellitus without complication ([place])   . Stroke ([place]) 03/2014  . Stroke [facility]) 03/2015    Past Surgical History  Procedure Laterality Date  . Cholecystectomy  03/2004  . Abdominal hysterectomy  1995    Part Hyst BSO DUB ovarian cyst B9  .  Vaginal delivery  1977  . Breast reduction surgery  1984  . Cystoscopy  01/08/2008    Former site of inflammation healed (Dr. Jacqlyn Larsen)  . Nsvd x1  1977  . Esophagogastroduodenoscopy  1. 09/01/02  2. 08/15/06    1. Reflux, HH  2. Reflux esoph HH gaastritis nonbleeding erosive gastropathy duodenitis  . Colonoscopy  1. 08/15/06  2. 05/20/09    Int Hemms  2. Divertics Int Hemms (Dr. Vira Agar)  . Ct abd w & pelvis wo cm  6/09    CT Scan abd stable adrenal adenoma 1.4cm left adrenal no change (Dr. Jacqlyn Larsen)    Filed Vitals:   04/07/15 1023  BP: 130/77    Visit Diagnosis:  Unsteadiness on feet      Subjective Assessment - 04/07/15 1023    Subjective  pt reports her "nerves were bad this morning ".    Patient Stated Goals "get back my strength, and be able to hold my balance."   Currently in Pain? Yes   Pain Score 7    Pain Location --  lower legs (neuropathy)     gait training: With SPC 8 laps x 15m min cues for upright gaze and sequencing, CGA for safety Cone weaving same cues as above x 8 laps min A needed at  times for safety and mod cues needed for R LE step length  NMR: Semi-tandem 30s x 2 Semi tandem 30s x 2 with head turns 3 step SLS 1 min x3 Fwd and reverse mini lunge x 10 each leg   pt requires CGA for safety on balance exercises                              PT Education - 04/07/15 1023    Education provided Yes   Education Details gait training with [facility]    Person(s) Educated Patient   Methods Explanation   Comprehension Verbalized understanding             PT Long Term Goals - 03/29/15 1552    PT LONG TERM GOAL #1   Title pt will perform TUG using LRAD in <12 s demonstrating reduced risk of falls.   Time 6   Period Weeks   Status New   PT LONG TERM GOAL #2   Title pt will score 52 or greater on the berg balance test, reducing fall risk.    Time 6   Period Weeks   Status New   PT LONG TERM GOAL #3   Title pt will ambulate using LRAD at  least 0.90m/s for normalized home mobility.    Time 6   Period Weeks   Status New   PT LONG TERM GOAL #4   Title pt will ambulate x 145ft using SPC safely to return to PLOF.    Time 6   Period Weeks   Status New               Plan - 04/07/15 1104    Clinical Impression Statement pt had better performance with SPC today, but did seem to have more trouble sequencing SPC with fatigue, needing cues and min A to correct balance at times when trying to negotiate cones. pt had imrpoved static balance , able to stand SLS 5s at times.    Pt will benefit from skilled therapeutic intervention in order to improve on the following deficits Abnormal gait;Decreased activity tolerance;Decreased balance;Decreased strength;Impaired sensation;Decreased endurance   Rehab Potential Good   Clinical Impairments Affecting Rehab Potential HTN, DM, hx of anxiety/ depression, previous CVA   PT Frequency 2x / week   PT Duration 6 weeks   PT Treatment/Interventions ADLs/Self Care Home Management;Aquatic Therapy;Balance training;Therapeutic exercise;Therapeutic activities;Gait training;Manual techniques   PT Next Visit Plan progress hip strengthening HEP   Consulted and Agree with Plan of Care Patient;Family member/caregiver        Problem List Patient Active Problem List   Diagnosis Date Noted  . Cerebrovascular accident (CVA) due to occlusion of cerebral artery ([place])   . Petechial rash   . Venous hemorrhage   . Petechiae   . Absolute anemia   . Accelerated hypertension 03/11/2015  . Intractable nausea and vomiting 03/11/2015  . Anxiety 03/11/2015  . Stroke ([place]) 03/11/2015  . Hand pain 09/09/2014  . Psychomotor retardation 05/25/2014  . Syncope, cardiogenic 05/05/2014  . Tremor 05/05/2014  . Multiple falls 11/28/2013  . Elevated immunoglobulin A 09/02/2013  . Anemia of other chronic disease 09/01/2013  . Barrett's esophagus 08/18/2013  . Other malaise and fatigue 07/29/2013  . Diarrhea  03/18/2013  . DM (diabetes mellitus), type 2 with neurological complications ([place]) [id number]  . Bilateral leg pain 07/02/2012  . Urinary incontinence 09/25/2011  . Diaphoresis 12/21/2010  . Leg edema 05/11/2010  .  B12 deficiency 05/11/2010  . BENIGN POSITIONAL VERTIGO 01/06/2009  . ABDOMINAL PAIN, LEFT UPPER QUADRANT 12/10/2008  . UNSPECIFIED VITAMIN D DEFICIENCY 09/07/2008  . ROSACEA 09/07/2008  . NEUROPATHY 08/24/2008  . Other chest pain 05/19/2008  . EROSIVE ESOPHAGITIS 02/03/2008  . EROSIVE GASTRITIS 02/03/2008  . Anxiety state 02/14/2007  . OBESITY 11/15/2006  . Extremity pain 09/28/2006  . ADRENAL MASS VIA CT 07/07/2006  . Hypothyroidism 07/05/2006  . HYPERLIPIDEMIA, MIXED 07/05/2006  . Essential hypertension 07/05/2006  . GERD 07/05/2006  . DIVERTICULOSIS, COLON 07/05/2006  . IBS 07/05/2006  . INSOMNIA 07/05/2006   Gorden Harms. Carlas Vandyne, PT, DPT [phone]  Dacey Milberger 04/07/2015, 11:11 AM  [place] MAIN [facility] SERVICES [address] [place], [place], [address] Phone: [phone]   Fax:  [phone]  Name: Elizabeth Rivera MRN: [id number] Date of Birth: 11-20-1950

## 2015-04-08 ENCOUNTER — Ambulatory Visit (INDEPENDENT_AMBULATORY_CARE_PROVIDER_SITE_OTHER): Payer: PPO | Admitting: Family Medicine

## 2015-04-08 ENCOUNTER — Encounter: Payer: Self-pay | Admitting: Family Medicine

## 2015-04-08 VITALS — BP 130/70 | HR 66 | Wt 138.0 lb

## 2015-04-08 DIAGNOSIS — R0789 Other chest pain: Secondary | ICD-10-CM | POA: Diagnosis not present

## 2015-04-08 DIAGNOSIS — G609 Hereditary and idiopathic neuropathy, unspecified: Secondary | ICD-10-CM

## 2015-04-08 DIAGNOSIS — I10 Essential (primary) hypertension: Secondary | ICD-10-CM

## 2015-04-08 DIAGNOSIS — Z5189 Encounter for other specified aftercare: Secondary | ICD-10-CM | POA: Diagnosis not present

## 2015-04-08 DIAGNOSIS — E039 Hypothyroidism, unspecified: Secondary | ICD-10-CM

## 2015-04-08 LAB — BASIC METABOLIC PANEL
BUN: 16 mg/dL (ref 6–23)
CALCIUM: 9.9 mg/dL (ref 8.4–10.5)
CO2: 30 meq/L (ref 19–32)
CREATININE: 0.82 mg/dL (ref 0.40–1.20)
Chloride: 98 mEq/L (ref 96–112)
GFR: 74.81 mL/min (ref >60.00)
Glucose, Bld: 154 mg/dL — ABNORMAL HIGH (ref 70–99)
Potassium: 3.9 mEq/L (ref 3.5–5.1)
SODIUM: 135 meq/L (ref 135–145)

## 2015-04-08 MED ORDER — HYDROCHLOROTHIAZIDE 25 MG PO TABS: 25.0000 mg | ORAL_TABLET | Freq: Every day | ORAL | Status: DC

## 2015-04-08 MED ORDER — FERROUS SULFATE 325 (65 FE) MG PO TABS: 325.0000 mg | ORAL_TABLET | Freq: Every day | ORAL | Status: AC

## 2015-04-08 MED ORDER — AMLODIPINE BESYLATE 10 MG PO TABS: 10.0000 mg | ORAL_TABLET | Freq: Every day | ORAL | Status: DC

## 2015-04-08 NOTE — Patient Instructions (Addendum)
Go to the lab on the way out.  We'll contact you with your lab report (BMET). Rosaria Ferries will call about your referral for cardiology.  See her on the way out.  Get back on the iron when you can tolerate it.   Recheck TSH at the end of the month.   Take care.  Glad to see you.

## 2015-04-08 NOTE — Assessment & Plan Note (Signed)
She ruled out for MI.  Refer to cards for consideration of outpatient stress testing.  She couldn't do a treadmill test.  Continue current meds at BP is controlled.  Recheck BMET today.  She agrees.  Okay for outpatient f/u.

## 2015-04-08 NOTE — Progress Notes (Signed)
Pre visit review using our clinic review tool, if applicable. No additional management support is needed unless otherwise documented below in the visit note.  DATE OF ADMISSION: 2/24/2017ADMITTING PHYSICIAN: Hillary Bow, MD  DATE OF DISCHARGE:04/03/15  PRIMARY CARE PHYSICIAN: Elsie Stain, MD    ADMISSION DIAGNOSIS:  Hypertensive urgency [I16.0] Chest pain, moderate coronary artery risk [R07.9]  DISCHARGE DIAGNOSIS:  Active Problems:  Accelerated hypertension migraine headache and tension headache  SECONDARY DIAGNOSIS:   Past Medical History  Diagnosis Date  . Thyroid disease     hypothyroid  . Anxiety   . Hyperlipidemia   . Hypertension   . Other chest pain     chest discomfort  . Obesity   . Neuropathy ([place])   . Other esophagitis     erosive esophagitis  . Other specified gastritis without mention of hemorrhage     erosvie gastritis  . Pain in limb     Right leg pain  . Other specified disorders of adrenal glands 07/2007    adrenal mass via of CT 1.4cm left Adrenal no change(Dr.Cope)   . Other abnormal blood chemistry   . Diverticulosis of colon (without mention of hemorrhage) 05/20/2009    Internal hemms (Dr. Vira Agar)  . Irritable bowel syndrome     history of  . Insomnia, unspecified   . Nervous breakdown 08/2002    [facility]  . GERD (gastroesophageal reflux disease)     reflux HH 09/01/2002//egd reflux Esoph. HH Gastritis nonbleeding erosive gastropathy Duodentitis 08/15/2006  . Hyperglycemia   . Candidal vaginitis   . Forearm fracture     2013, left  . Anemia   . Anemia of other chronic disease 09/01/2013  . Diabetes mellitus without complication ([place])   . Stroke ([place]) 03/2014  . Stroke [facility]) 03/2015    HOSPITAL COURSE:   * Accelerated hypertension-clinically improving Continue patient's lisinopril and metoprolol.   Added hydrochlorothiazide and amlodipine for elevated blood pressure for better blood pressure control  *Chronic headache secondary to tension and migraine headaches Fioricet as needed Needs better control of the blood pressure Outpatient follow-up with neurology as recommended CT head is negative  * Insulin-dependent diabetes mellitus New home dose of Lantus along with sliding scale insulin. Diabetic diet.  * Recent CVA Continue aspirin, Plavix, statin. Improving with physical therapy at home.  * Chronic Chest pain Ruled out acute MI with negative troponins This was thought to be anxiety as per her outpatient physicians. This is unchanged. Recommended outpatient stress test, PCP to consider referring the patient to outpatient cardiology  * DVT prophylaxis with Lovenox     Hospital course d/w pt.  CCB and HCTZ added for HTN.  Has tolerated both with improved BP readings.  Her HA is resolved so she likely doesn't need neuro referral at this point.   No CP now, had pressure in chest prev when her BP was elevated.  No sx now.  Not SOB now.  Still using her walker, rolling walker.  Borrowed the walker, needs rx for her own, done at [facility] with dx of neuropathy..  Due for f/u labs re: BMET.  D/w pt about not being due for f/u TSH at this point.   PMH and SH reviewed  ROS: See HPI, otherwise noncontributory.  Meds, vitals, and allergies reviewed.   GEN: nad, alert and oriented HEENT: mucous membranes moist NECK: supple w/o LA CV: rrr. PULM: ctab, no inc wob ABD: soft, +bs EXT: no edema

## 2015-04-08 NOTE — Assessment & Plan Note (Signed)
Recheck tsh at the end of the month.  D/w pt.

## 2015-04-08 NOTE — Assessment & Plan Note (Signed)
rx done for walker, d/w pt about fall cautions.

## 2015-04-13 ENCOUNTER — Ambulatory Visit: Payer: PPO

## 2015-04-13 VITALS — BP 146/78 | HR 69

## 2015-04-13 DIAGNOSIS — R2681 Unsteadiness on feet: Secondary | ICD-10-CM

## 2015-04-13 NOTE — Therapy (Signed)
[place] MAIN [facility] SERVICES [address] [place], [place], [address] Phone: [phone]   Fax:  [phone]  Physical Therapy Treatment  Patient Details  Name: Elizabeth Rivera MRN: [id number] Date of Birth: [birth year] Referring Provider: Dr. Ouida Sills  Encounter Date: 04/13/2015      PT End of Session - 04/13/15 1414    Visit Number 5   Number of Visits 17   Date for PT Re-Evaluation 04/26/15   Authorization Type 5/10   PT Start Time 1030   PT Stop Time 1115   PT Time Calculation (min) 45 min   Equipment Utilized During Treatment Gait belt   Activity Tolerance Patient tolerated treatment well   Behavior During Therapy Flat affect  motivated      Past Medical History  Diagnosis Date  . Thyroid disease     hypothyroid  . Anxiety   . Hyperlipidemia   . Hypertension   . Other chest pain     chest discomfort  . Obesity   . Neuropathy ([place])   . Other esophagitis     erosive esophagitis  . Other specified gastritis without mention of hemorrhage     erosvie gastritis  . Pain in limb     Right leg pain  . Other specified disorders of adrenal glands 07/2007    adrenal mass via of CT 1.4cm left Adrenal no change(Dr.Cope)   . Other abnormal blood chemistry   . Diverticulosis of colon (without mention of hemorrhage) 05/20/2009    Internal hemms (Dr. Vira Agar)  . Irritable bowel syndrome     history of  . Insomnia, unspecified   . Nervous breakdown 08/2002    [facility]  . GERD (gastroesophageal reflux disease)     reflux HH 09/01/2002//egd reflux Esoph. HH Gastritis nonbleeding erosive gastropathy Duodentitis 08/15/2006  . Hyperglycemia   . Candidal vaginitis   . Forearm fracture     2013, left  . Anemia   . Anemia of other chronic disease 09/01/2013  . Diabetes mellitus without complication ([place])   . Stroke ([place]) 03/2014  . Stroke [facility]) 03/2015    Past Surgical History  Procedure Laterality Date  . Cholecystectomy   03/2004  . Abdominal hysterectomy  1995    Part Hyst BSO DUB ovarian cyst B9  . Vaginal delivery  1977  . Breast reduction surgery  1984  . Cystoscopy  01/08/2008    Former site of inflammation healed (Dr. Jacqlyn Larsen)  . Nsvd x1  1977  . Esophagogastroduodenoscopy  1. 09/01/02  2. 08/15/06    1. Reflux, HH  2. Reflux esoph HH gaastritis nonbleeding erosive gastropathy duodenitis  . Colonoscopy  1. 08/15/06  2. 05/20/09    Int Hemms  2. Divertics Int Hemms (Dr. Vira Agar)  . Ct abd w & pelvis wo cm  6/09    CT Scan abd stable adrenal adenoma 1.4cm left adrenal no change (Dr. Jacqlyn Larsen)    Filed Vitals:   04/13/15 1412  BP: 146/78  Pulse: 69    Visit Diagnosis:  Unsteadiness on feet      Subjective Assessment - 04/13/15 1412    Subjective pt reports her BP has been up and down    Patient Stated Goals "get back my strength, and be able to hold my balance."   Currently in Pain? No/denies      Gait training: Extensive training today with progression of activities when ambulating with SPC: In clinic 50m x 6 laps , min cues for  sequencing , increased R step length, and upright gaze In hallway ambulating with SPC 124ft x 3, min cues for R inc. Step length, upright gaze and sequencing Transfer from chair  To chair (about 47ft) with cues to carry over all parts of gait training x 8 reps In clinic cone scavenger hunt with pt ambulating with SPC 6 cones x 2 rounds- pt needed multiple passes to find cones. CGA for safety, min cues for sequencing Curb negotiation (4in) up and down x 10 repetitions with ambulating approach using SPC. Min-mod cues.  Pt requires min verbal and tactile cues for proper exercise performance                             PT Education - 04/13/15 1413    Education provided Yes   Education Details progression of gait training    Person(s) Educated Patient   Methods Explanation   Comprehension Verbalized understanding;Returned demonstration;Need further  instruction             PT Long Term Goals - 03/29/15 1552    PT LONG TERM GOAL #1   Title pt will perform TUG using LRAD in <12 s demonstrating reduced risk of falls.   Time 6   Period Weeks   Status New   PT LONG TERM GOAL #2   Title pt will score 52 or greater on the berg balance test, reducing fall risk.    Time 6   Period Weeks   Status New   PT LONG TERM GOAL #3   Title pt will ambulate using LRAD at least 0.52m/s for normalized home mobility.    Time 6   Period Weeks   Status New   PT LONG TERM GOAL #4   Title pt will ambulate x 128ft using SPC safely to return to PLOF.    Time 6   Period Weeks   Status New               Plan - 04/13/15 1415    Clinical Impression Statement pt did well with progression of gait training today. later focus on multitasking with gait training. at times pt reverts back to poor gait habit when she thinks the "exercise" is over with. pt encouraged to keep gait training in mind at all times when ambulating to encourage home carry over. pt had no instances of LOB when walking with the cane today.    Pt will benefit from skilled therapeutic intervention in order to improve on the following deficits Abnormal gait;Decreased activity tolerance;Decreased balance;Decreased strength;Impaired sensation;Decreased endurance   Rehab Potential Good   Clinical Impairments Affecting Rehab Potential HTN, DM, hx of anxiety/ depression, previous CVA   PT Frequency 2x / week   PT Duration 6 weeks   PT Treatment/Interventions ADLs/Self Care Home Management;Aquatic Therapy;Balance training;Therapeutic exercise;Therapeutic activities;Gait training;Manual techniques   PT Next Visit Plan progress hip strengthening HEP   Consulted and Agree with Plan of Care Patient;Family member/caregiver        Problem List Patient Active Problem List   Diagnosis Date Noted  . Cerebrovascular accident (CVA) due to occlusion of cerebral artery ([place])   . Petechial  rash   . Venous hemorrhage   . Petechiae   . Absolute anemia   . Accelerated hypertension 03/11/2015  . Intractable nausea and vomiting 03/11/2015  . Anxiety 03/11/2015  . Stroke ([place]) 03/11/2015  . Hand pain 09/09/2014  . Psychomotor retardation 05/25/2014  .  Syncope, cardiogenic 05/05/2014  . Tremor 05/05/2014  . Multiple falls 11/28/2013  . Elevated immunoglobulin A 09/02/2013  . Anemia of other chronic disease 09/01/2013  . Barrett's esophagus 08/18/2013  . Other malaise and fatigue 07/29/2013  . Diarrhea 03/18/2013  . DM (diabetes mellitus), type 2 with neurological complications ([place]) [id number]  . Bilateral leg pain 07/02/2012  . Urinary incontinence 09/25/2011  . Diaphoresis 12/21/2010  . Leg edema 05/11/2010  . B12 deficiency 05/11/2010  . BENIGN POSITIONAL VERTIGO 01/06/2009  . ABDOMINAL PAIN, LEFT UPPER QUADRANT 12/10/2008  . UNSPECIFIED VITAMIN D DEFICIENCY 09/07/2008  . ROSACEA 09/07/2008  . Hereditary and idiopathic peripheral neuropathy 08/24/2008  . Other chest pain 05/19/2008  . EROSIVE ESOPHAGITIS 02/03/2008  . EROSIVE GASTRITIS 02/03/2008  . Anxiety state 02/14/2007  . OBESITY 11/15/2006  . Extremity pain 09/28/2006  . ADRENAL MASS VIA CT 07/07/2006  . Hypothyroidism 07/05/2006  . HYPERLIPIDEMIA, MIXED 07/05/2006  . Essential hypertension 07/05/2006  . GERD 07/05/2006  . DIVERTICULOSIS, COLON 07/05/2006  . IBS 07/05/2006  . INSOMNIA 07/05/2006   Gorden Harms. Maelin Kurkowski, PT, DPT [phone]  Zanovia Rotz 04/13/2015, 2:17 PM  [place] MAIN [facility] SERVICES [address] [place], [place], [address] Phone: [phone]   Fax:  [phone]  Name: Elizabeth Rivera MRN: [id number] Date of Birth: [birth year]

## 2015-04-15 ENCOUNTER — Ambulatory Visit: Payer: PPO

## 2015-04-20 ENCOUNTER — Encounter: Payer: Self-pay | Admitting: Primary Care

## 2015-04-20 ENCOUNTER — Ambulatory Visit (INDEPENDENT_AMBULATORY_CARE_PROVIDER_SITE_OTHER): Payer: PPO | Admitting: Primary Care

## 2015-04-20 ENCOUNTER — Ambulatory Visit: Payer: PPO

## 2015-04-20 VITALS — BP 136/78 | HR 77 | Temp 98.1°F

## 2015-04-20 VITALS — BP 114/70 | HR 68 | Temp 97.6°F | Wt 139.4 lb

## 2015-04-20 DIAGNOSIS — R2681 Unsteadiness on feet: Secondary | ICD-10-CM | POA: Diagnosis not present

## 2015-04-20 DIAGNOSIS — N390 Urinary tract infection, site not specified: Secondary | ICD-10-CM

## 2015-04-20 DIAGNOSIS — R319 Hematuria, unspecified: Secondary | ICD-10-CM

## 2015-04-20 LAB — POC URINALSYSI DIPSTICK (AUTOMATED)
Bilirubin, UA: NEGATIVE
KETONES UA: NEGATIVE
NITRITE UA: NEGATIVE
PROTEIN UA: NEGATIVE
Spec Grav, UA: 1.015
Urobilinogen, UA: NEGATIVE
pH, UA: 6

## 2015-04-20 MED ORDER — CEPHALEXIN 500 MG PO CAPS: 500.0000 mg | ORAL_CAPSULE | Freq: Two times a day (BID) | ORAL | Status: DC

## 2015-04-20 MED ORDER — PHENAZOPYRIDINE HCL 200 MG PO TABS: 200.0000 mg | ORAL_TABLET | Freq: Three times a day (TID) | ORAL | Status: DC | PRN

## 2015-04-20 NOTE — Therapy (Signed)
[place] MAIN [facility] SERVICES [address] [place], [place], [address] Phone: [phone]   Fax:  [phone]  Physical Therapy Treatment  Patient Details  Name: Elizabeth Rivera MRN: [id number] Date of Birth: 10-10-1950 Referring Provider: Dr. Ouida Sills  Encounter Date: 04/20/2015      PT End of Session - 04/20/15 1406    Visit Number 6   Number of Visits 17   Date for PT Re-Evaluation 04/26/15   Authorization Type 6/10   PT Start Time 0945   PT Stop Time 1030   PT Time Calculation (min) 45 min   Equipment Utilized During Treatment Gait belt   Activity Tolerance Patient tolerated treatment well   Behavior During Therapy Flat affect  motivated      Past Medical History  Diagnosis Date  . Thyroid disease     hypothyroid  . Anxiety   . Hyperlipidemia   . Hypertension   . Other chest pain     chest discomfort  . Obesity   . Neuropathy ([place])   . Other esophagitis     erosive esophagitis  . Other specified gastritis without mention of hemorrhage     erosvie gastritis  . Pain in limb     Right leg pain  . Other specified disorders of adrenal glands 07/2007    adrenal mass via of CT 1.4cm left Adrenal no change(Dr.Cope)   . Other abnormal blood chemistry   . Diverticulosis of colon (without mention of hemorrhage) 05/20/2009    Internal hemms (Dr. Vira Agar)  . Irritable bowel syndrome     history of  . Insomnia, unspecified   . Nervous breakdown 08/2002    [facility]  . GERD (gastroesophageal reflux disease)     reflux HH 09/01/2002//egd reflux Esoph. HH Gastritis nonbleeding erosive gastropathy Duodentitis 08/15/2006  . Hyperglycemia   . Candidal vaginitis   . Forearm fracture     2013, left  . Anemia   . Anemia of other chronic disease 09/01/2013  . Diabetes mellitus without complication ([place])   . Stroke ([place]) 03/2014  . Stroke [facility]) 03/2015    Past Surgical History  Procedure Laterality Date  . Cholecystectomy   03/2004  . Abdominal hysterectomy  1995    Part Hyst BSO DUB ovarian cyst B9  . Vaginal delivery  1977  . Breast reduction surgery  1984  . Cystoscopy  01/08/2008    Former site of inflammation healed (Dr. Jacqlyn Larsen)  . Nsvd x1  1977  . Esophagogastroduodenoscopy  1. 09/01/02  2. 08/15/06    1. Reflux, HH  2. Reflux esoph HH gaastritis nonbleeding erosive gastropathy duodenitis  . Colonoscopy  1. 08/15/06  2. 05/20/09    Int Hemms  2. Divertics Int Hemms (Dr. Vira Agar)  . Ct abd w & pelvis wo cm  6/09    CT Scan abd stable adrenal adenoma 1.4cm left adrenal no change (Dr. Jacqlyn Larsen)    Filed Vitals:   04/20/15 1405  BP: 136/78  Pulse: 77  Temp: 98.1 F (36.7 C)    Visit Diagnosis:  Unsteadiness on feet      Subjective Assessment - 04/20/15 1405    Subjective pt reports she believes she may have a bladder infection. she is going to the MD tomorrow. pt reports she has been using the cane some at home    Currently in Pain? No/denies       all gait training today performed with SPC: In clinic 2x96m pt needed nil  cues for sequencing and SBA for safety In clinic obstacle course including cone weave, step up and down off 4inch step, then stepping over small objects x 2- pt performed this x 5 laps with CGA for safety, min cues needed for sequencing and safety In clinic cone scavenger hunt in order to keep upright gaze and prepare for varying enviornment 7 cones x 2 rounds, SBA for safety. Pt was able to find all cones Hallway ambulation with CGA commands for "fast/slow" 169ft x 2 Hallway ambulation with CGA commands for fwd/reverse 113ft x 2 In hallway finding cards on the wall with L/R gaze CGA to min A for safety 146ft x 4 .                          PT Education - 04/20/15 1406    Education provided Yes   Education Details up/down curb with cane    Person(s) Educated Patient   Methods Explanation   Comprehension Verbalized understanding             PT Long  Term Goals - 03/29/15 1552    PT LONG TERM GOAL #1   Title pt will perform TUG using LRAD in <12 s demonstrating reduced risk of falls.   Time 6   Period Weeks   Status New   PT LONG TERM GOAL #2   Title pt will score 52 or greater on the berg balance test, reducing fall risk.    Time 6   Period Weeks   Status New   PT LONG TERM GOAL #3   Title pt will ambulate using LRAD at least 0.73m/s for normalized home mobility.    Time 6   Period Weeks   Status New   PT LONG TERM GOAL #4   Title pt will ambulate x 123ft using SPC safely to return to PLOF.    Time 6   Period Weeks   Status New               Plan - 04/20/15 1408    Clinical Impression Statement pt did quite well with gait training involving multitasking compared to previous visits despite not feeling well today. pt was able to ambulate with proper sequencing suing SPC >90% of the time needing only SBA. pt had the most difficulty maintaining upright gaze and scanning the enviornment L/R when walkin in the hallway which did cause a mild LOB requiring min A for correction. Otherwise pt is progressing nicely. will perform progress note next visit.    Pt will benefit from skilled therapeutic intervention in order to improve on the following deficits Abnormal gait;Decreased activity tolerance;Decreased balance;Decreased strength;Impaired sensation;Decreased endurance   Rehab Potential Good   Clinical Impairments Affecting Rehab Potential HTN, DM, hx of anxiety/ depression, previous CVA   PT Frequency 2x / week   PT Duration 6 weeks   PT Treatment/Interventions ADLs/Self Care Home Management;Aquatic Therapy;Balance training;Therapeutic exercise;Therapeutic activities;Gait training;Manual techniques   PT Next Visit Plan progress note   Consulted and Agree with Plan of Care Patient;Family member/caregiver        Problem List Patient Active Problem List   Diagnosis Date Noted  . Cerebrovascular accident (CVA) due to  occlusion of cerebral artery ([place])   . Petechial rash   . Venous hemorrhage   . Petechiae   . Absolute anemia   . Accelerated hypertension 03/11/2015  . Intractable nausea and vomiting 03/11/2015  . Anxiety 03/11/2015  . Stroke (  [place]) 03/11/2015  . Hand pain 09/09/2014  . Psychomotor retardation 05/25/2014  . Syncope, cardiogenic 05/05/2014  . Tremor 05/05/2014  . Multiple falls 11/28/2013  . Elevated immunoglobulin A 09/02/2013  . Anemia of other chronic disease 09/01/2013  . Barrett's esophagus 08/18/2013  . Other malaise and fatigue 07/29/2013  . Diarrhea 03/18/2013  . DM (diabetes mellitus), type 2 with neurological complications ([place]) [id number]  . Bilateral leg pain 07/02/2012  . Urinary incontinence 09/25/2011  . Diaphoresis 12/21/2010  . Leg edema 05/11/2010  . B12 deficiency 05/11/2010  . BENIGN POSITIONAL VERTIGO 01/06/2009  . ABDOMINAL PAIN, LEFT UPPER QUADRANT 12/10/2008  . UNSPECIFIED VITAMIN D DEFICIENCY 09/07/2008  . ROSACEA 09/07/2008  . Hereditary and idiopathic peripheral neuropathy 08/24/2008  . Other chest pain 05/19/2008  . EROSIVE ESOPHAGITIS 02/03/2008  . EROSIVE GASTRITIS 02/03/2008  . Anxiety state 02/14/2007  . OBESITY 11/15/2006  . Extremity pain 09/28/2006  . ADRENAL MASS VIA CT 07/07/2006  . Hypothyroidism 07/05/2006  . HYPERLIPIDEMIA, MIXED 07/05/2006  . Essential hypertension 07/05/2006  . GERD 07/05/2006  . DIVERTICULOSIS, COLON 07/05/2006  . IBS 07/05/2006  . INSOMNIA 07/05/2006   Gorden Harms. Alli Jasmer, PT, DPT [phone]  Nester Bachus 04/20/2015, 2:11 PM  [place] MAIN [facility] SERVICES [address] [place], [place], [address] Phone: [phone]   Fax:  [phone]  Name: Elizabeth Rivera MRN: [id number] Date of Birth: [birth year]

## 2015-04-20 NOTE — Addendum Note (Signed)
Addended by: Jacqualin Combes on: 04/20/2015 02:34 PM   Modules accepted: Orders, SmartSet

## 2015-04-20 NOTE — Progress Notes (Signed)
Subjective:    Patient ID: Elizabeth Rivera, female    DOB: 12/10/1950, 65 y.o.   MRN: [id number]  HPI  Elizabeth Rivera is a 65 year old female who presents today with a chief complaint of urinary frequency. She also reports dysuria, pelvic discomfort, low grade fevers since Friday last week. Her symptoms began Thursday last week. Denies hematuria, vaginal discharge, vaginal itching. She's taken tylenol OTC for her symptoms without improvement.   Review of Systems  Constitutional: Positive for fever and fatigue.  Gastrointestinal: Negative for abdominal pain.  Genitourinary: Positive for dysuria, frequency and flank pain. Negative for vaginal discharge.  Neurological: Positive for weakness.       Past Medical History  Diagnosis Date  . Thyroid disease     hypothyroid  . Anxiety   . Hyperlipidemia   . Hypertension   . Other chest pain     chest discomfort  . Obesity   . Neuropathy ([place])   . Other esophagitis     erosive esophagitis  . Other specified gastritis without mention of hemorrhage     erosvie gastritis  . Pain in limb     Right leg pain  . Other specified disorders of adrenal glands 07/2007    adrenal mass via of CT 1.4cm left Adrenal no change(Dr.Cope)   . Other abnormal blood chemistry   . Diverticulosis of colon (without mention of hemorrhage) 05/20/2009    Internal hemms (Dr. Vira Agar)  . Irritable bowel syndrome     history of  . Insomnia, unspecified   . Nervous breakdown 08/2002    [facility]  . GERD (gastroesophageal reflux disease)     reflux HH 09/01/2002//egd reflux Esoph. HH Gastritis nonbleeding erosive gastropathy Duodentitis 08/15/2006  . Hyperglycemia   . Candidal vaginitis   . Forearm fracture     2013, left  . Anemia   . Anemia of other chronic disease 09/01/2013  . Diabetes mellitus without complication ([place])   . Stroke ([place]) 03/2014  . Stroke [facility]) 03/2015    Social History   Social History  . Marital Status: Married    Spouse Name: N/A   . Number of Children: N/A  . Years of Education: N/A   Occupational History  . Not on file.   Social History Main Topics  . Smoking status: Never Smoker   . Smokeless tobacco: Never Used  . Alcohol Use: No  . Drug Use: No  . Sexual Activity: No   Other Topics Concern  . Not on file   Social History Narrative   No regular exercise.   Worked at [employer] in [place]   Her elderly mother had a CVA and moved in with patient    Past Surgical History  Procedure Laterality Date  . Cholecystectomy  03/2004  . Abdominal hysterectomy  1995    Part Hyst BSO DUB ovarian cyst B9  . Vaginal delivery  1977  . Breast reduction surgery  1984  . Cystoscopy  01/08/2008    Former site of inflammation healed (Dr. Jacqlyn Larsen)  . Nsvd x1  1977  . Esophagogastroduodenoscopy  1. 09/01/02  2. 08/15/06    1. Reflux, HH  2. Reflux esoph HH gaastritis nonbleeding erosive gastropathy duodenitis  . Colonoscopy  1. 08/15/06  2. 05/20/09    Int Hemms  2. Divertics Int Hemms (Dr. Vira Agar)  . Ct abd w & pelvis wo cm  6/09    CT Scan abd stable adrenal adenoma 1.4cm left adrenal no  change (Dr. Jacqlyn Larsen)    Family History  Problem Relation Age of Onset  . Hypertension Mother   . Hyperlipidemia Mother   . Stroke Mother     50  . Vision loss Father     vision problems  . Cancer Father     Colon   . Hyperlipidemia Sister   . Hypertension Sister   . Vision loss Sister     Allergies  Allergen Reactions  . Sulfa Antibiotics Rash    Current Outpatient Prescriptions on File Prior to Visit  Medication Sig Dispense Refill  . acetaminophen (TYLENOL) 325 MG tablet Take 650 mg by mouth every 4 (four) hours as needed for mild pain, fever or headache.    . ALPRAZolam (XANAX) 0.25 MG tablet Take 1 tablet (0.25 mg total) by mouth 2 (two) times daily. 30 tablet 0  . amLODipine (NORVASC) 10 MG tablet Take 1 tablet (10 mg total) by mouth daily. 90 tablet 3  . aspirin-acetaminophen-caffeine (EXCEDRIN MIGRAINE)  250-250-65 MG tablet Take 1 tablet by mouth every 6 (six) hours as needed for headache.    Marland Kitchen atorvastatin (LIPITOR) 40 MG tablet Take 1 tablet (40 mg total) by mouth daily at 6 PM. 30 tablet 0  . cholecalciferol (VITAMIN D) 1000 units tablet Take 1,000 Units by mouth daily.    . clopidogrel (PLAVIX) 75 MG tablet Take 1 tablet (75 mg total) by mouth daily. 30 tablet 0  . ferrous sulfate ([employer] IRON) 325 (65 FE) MG tablet Take 1 tablet (325 mg total) by mouth daily with breakfast.    . hydrochlorothiazide (HYDRODIURIL) 25 MG tablet Take 1 tablet (25 mg total) by mouth daily. 90 tablet 3  . insulin glargine (LANTUS) 100 UNIT/ML injection Inject 9 Units into the skin at bedtime.    Marland Kitchen levothyroxine (SYNTHROID, LEVOTHROID) 125 MCG tablet Take 1 tablet (125 mcg total) by mouth daily before breakfast. 90 tablet 3  . lisinopril (PRINIVIL,ZESTRIL) 40 MG tablet Take 1 tablet (40 mg total) by mouth daily. 30 tablet 0  . Melatonin 5 MG TABS Take 5 mg by mouth at bedtime.    . metFORMIN (GLUCOPHAGE) 1000 MG tablet Take 0.5 tablets (500 mg total) by mouth 2 (two) times daily with a meal. 90 tablet 3  . metoprolol succinate (TOPROL-XL) 100 MG 24 hr tablet Take 150 mg by mouth daily. Take with or immediately following a meal.    . omeprazole (PRILOSEC) 40 MG capsule Take 1 capsule (40 mg total) by mouth 2 (two) times daily. 180 capsule 3  . pregabalin (LYRICA) 150 MG capsule Take 150 mg by mouth every morning.    . pregabalin (LYRICA) 75 MG capsule Take 75 mg by mouth at bedtime.    . ranitidine (ZANTAC) 300 MG tablet Take 1 tablet (300 mg total) by mouth daily. 90 tablet 3  . venlafaxine XR (EFFEXOR-XR) 150 MG 24 hr capsule Take 1 capsule (150 mg total) by mouth 2 (two) times daily. 180 capsule 3  . vitamin B-12 (CYANOCOBALAMIN) 1000 MCG tablet Take 1,000 mcg by mouth daily.      No current facility-administered medications on file prior to visit.    BP 114/70 mmHg  Pulse 68  Temp(Src) 97.6 F (36.4 C) (Oral)   Wt 139 lb 6.4 oz (63.231 kg)  SpO2 98%    Objective:   Physical Exam  Constitutional: She appears well-nourished. She does not appear ill.  Neck: Neck supple.  Cardiovascular: Normal rate and regular rhythm.   Pulmonary/Chest: Effort  normal and breath sounds normal.  Abdominal: Soft. Bowel sounds are normal. There is no tenderness. There is no CVA tenderness.  Skin: Skin is warm and dry.          Assessment & Plan:  Urinary Tract Infection:  Urinary frequency, dysuria, pelvic pressure since Thursday last week. Low grade fevers intermittently since Friday. Exam unremarkable. No vaginal symptoms. Does not appear ill or toxic. No fevers today. UA: 1+ leuks, 1+ blood. No nitrites.  Will send culture. Given symptoms and age, will treat. Cephalexin 7 day course and pyridium PRN. Increase water and rest. Return precautions provided.

## 2015-04-20 NOTE — Progress Notes (Signed)
Pre visit review using our clinic review tool, if applicable. No additional management support is needed unless otherwise documented below in the visit note. 

## 2015-04-20 NOTE — Patient Instructions (Signed)
Start Cephalexin antibiotics. Take 1 capsule by mouth twice daily for 7 days.  You may take pyridium tablets as needed for pain. Take 1 tablet by mouth every 8 hours as needed for pain.  Ensure you are staying hydrated with water and rest.  Please notify me if no improvement in 3-4 days.   It was a pleasure to see you today!  Urinary Tract Infection Urinary tract infections (UTIs) can develop anywhere along your urinary tract. Your urinary tract is your body's drainage system for removing wastes and extra water. Your urinary tract includes two kidneys, two ureters, a bladder, and a urethra. Your kidneys are a pair of bean-shaped organs. Each kidney is about the size of your fist. They are located below your ribs, one on each side of your spine. CAUSES Infections are caused by microbes, which are microscopic organisms, including fungi, viruses, and bacteria. These organisms are so small that they can only be seen through a microscope. Bacteria are the microbes that most commonly cause UTIs. SYMPTOMS  Symptoms of UTIs may vary by age and gender of the patient and by the location of the infection. Symptoms in young women typically include a frequent and intense urge to urinate and a painful, burning feeling in the bladder or urethra during urination. Older women and men are more likely to be tired, shaky, and weak and have muscle aches and abdominal pain. A fever may mean the infection is in your kidneys. Other symptoms of a kidney infection include pain in your back or sides below the ribs, nausea, and vomiting. DIAGNOSIS To diagnose a UTI, your caregiver will ask you about your symptoms. Your caregiver will also ask you to provide a urine sample. The urine sample will be tested for bacteria and white blood cells. White blood cells are made by your body to help fight infection. TREATMENT  Typically, UTIs can be treated with medication. Because most UTIs are caused by a bacterial infection, they  usually can be treated with the use of antibiotics. The choice of antibiotic and length of treatment depend on your symptoms and the type of bacteria causing your infection. HOME CARE INSTRUCTIONS  If you were prescribed antibiotics, take them exactly as your caregiver instructs you. Finish the medication even if you feel better after you have only taken some of the medication.  Drink enough water and fluids to keep your urine clear or pale yellow.  Avoid caffeine, tea, and carbonated beverages. They tend to irritate your bladder.  Empty your bladder often. Avoid holding urine for long periods of time.  Empty your bladder before and after sexual intercourse.  After a bowel movement, women should cleanse from front to back. Use each tissue only once. SEEK MEDICAL CARE IF:   You have back pain.  You develop a fever.  Your symptoms do not begin to resolve within 3 days. SEEK IMMEDIATE MEDICAL CARE IF:   You have severe back pain or lower abdominal pain.  You develop chills.  You have nausea or vomiting.  You have continued burning or discomfort with urination. MAKE SURE YOU:   Understand these instructions.  Will watch your condition.  Will get help right away if you are not doing well or get worse.   This information is not intended to replace advice given to you by your health care provider. Make sure you discuss any questions you have with your health care provider.   Document Released: 11/02/2004 Document Revised: 10/14/2014 Document Reviewed: 03/03/2011 Elsevier Interactive  Patient Education 2016 [employer].

## 2015-04-21 ENCOUNTER — Other Ambulatory Visit: Payer: Self-pay

## 2015-04-21 MED ORDER — LISINOPRIL 40 MG PO TABS: 40.0000 mg | ORAL_TABLET | Freq: Every day | ORAL | Status: DC

## 2015-04-21 MED ORDER — ATORVASTATIN CALCIUM 40 MG PO TABS: 40.0000 mg | ORAL_TABLET | Freq: Every day | ORAL | Status: AC

## 2015-04-21 NOTE — Telephone Encounter (Signed)
Pt request 90 day refill atorvastatin 40 mg and lisinopril 40 mg to [facility]. Pt last seen 04/08/15; lisinopril refilled per protocol and note sent to Dr Damita Dunnings about atorvastatin; pt said Dr Damita Dunnings stopped atorvastatin last year but when pt was in the hospital the hospital physician restarted atorvastatin 40mg  taking one daily.Please advise. Pt request cb.

## 2015-04-21 NOTE — Telephone Encounter (Signed)
lipitor sent.  Thanks.

## 2015-04-22 ENCOUNTER — Telehealth: Payer: Self-pay | Admitting: *Deleted

## 2015-04-22 ENCOUNTER — Other Ambulatory Visit: Payer: Self-pay | Admitting: Family Medicine

## 2015-04-22 ENCOUNTER — Ambulatory Visit: Payer: PPO

## 2015-04-22 VITALS — BP 154/68 | HR 77

## 2015-04-22 DIAGNOSIS — R2681 Unsteadiness on feet: Secondary | ICD-10-CM | POA: Diagnosis not present

## 2015-04-22 DIAGNOSIS — Z1231 Encounter for screening mammogram for malignant neoplasm of breast: Secondary | ICD-10-CM

## 2015-04-22 MED ORDER — AMLODIPINE BESYLATE 10 MG PO TABS: 10.0000 mg | ORAL_TABLET | Freq: Every day | ORAL | Status: DC

## 2015-04-22 MED ORDER — CLOPIDOGREL BISULFATE 75 MG PO TABS: 75.0000 mg | ORAL_TABLET | Freq: Every day | ORAL | Status: AC

## 2015-04-22 MED ORDER — HYDROCHLOROTHIAZIDE 25 MG PO TABS: 25.0000 mg | ORAL_TABLET | Freq: Every day | ORAL | Status: DC

## 2015-04-22 NOTE — Therapy (Signed)
[place] MAIN [facility] SERVICES [address] [place], [place], [address] Phone: [phone]   Fax:  [phone]  Physical Therapy Treatment / Discharge Summary  Patient Details  Name: Elizabeth Rivera MRN: [id number] Date of Birth: 19-Sep-1950 Referring Provider: Dr. Ouida Sills  Encounter Date: 04/22/2015      PT End of Session - 04/22/15 0928    Visit Number 7   Number of Visits 17   Date for PT Re-Evaluation 04/26/15   Authorization Type 1/10   PT Start Time 0845   PT Stop Time 0925   PT Time Calculation (min) 40 min   Equipment Utilized During Treatment Gait belt   Activity Tolerance Patient tolerated treatment well   Behavior During Therapy Flat affect  motivated      Past Medical History  Diagnosis Date  . Thyroid disease     hypothyroid  . Anxiety   . Hyperlipidemia   . Hypertension   . Other chest pain     chest discomfort  . Obesity   . Neuropathy ([place])   . Other esophagitis     erosive esophagitis  . Other specified gastritis without mention of hemorrhage     erosvie gastritis  . Pain in limb     Right leg pain  . Other specified disorders of adrenal glands 07/2007    adrenal mass via of CT 1.4cm left Adrenal no change(Dr.Cope)   . Other abnormal blood chemistry   . Diverticulosis of colon (without mention of hemorrhage) 05/20/2009    Internal hemms (Dr. Vira Agar)  . Irritable bowel syndrome     history of  . Insomnia, unspecified   . Nervous breakdown 08/2002    [facility]  . GERD (gastroesophageal reflux disease)     reflux HH 09/01/2002//egd reflux Esoph. HH Gastritis nonbleeding erosive gastropathy Duodentitis 08/15/2006  . Hyperglycemia   . Candidal vaginitis   . Forearm fracture     2013, left  . Anemia   . Anemia of other chronic disease 09/01/2013  . Diabetes mellitus without complication ([place])   . Stroke ([place]) 03/2014  . Stroke [facility]) 03/2015    Past Surgical History  Procedure Laterality Date  .  Cholecystectomy  03/2004  . Abdominal hysterectomy  1995    Part Hyst BSO DUB ovarian cyst B9  . Vaginal delivery  1977  . Breast reduction surgery  1984  . Cystoscopy  01/08/2008    Former site of inflammation healed (Dr. Jacqlyn Larsen)  . Nsvd x1  1977  . Esophagogastroduodenoscopy  1. 09/01/02  2. 08/15/06    1. Reflux, HH  2. Reflux esoph HH gaastritis nonbleeding erosive gastropathy duodenitis  . Colonoscopy  1. 08/15/06  2. 05/20/09    Int Hemms  2. Divertics Int Hemms (Dr. Vira Agar)  . Ct abd w & pelvis wo cm  6/09    CT Scan abd stable adrenal adenoma 1.4cm left adrenal no change (Dr. Jacqlyn Larsen)    Filed Vitals:   04/22/15 0857  BP: 154/68  Pulse: 77    Visit Diagnosis:  Unsteadiness on feet      Subjective Assessment - 04/22/15 0853    Subjective pt reports she is being treated for a UTI.    Patient Stated Goals "get back my strength, and be able to hold my balance."   Currently in Pain? Yes   Pain Score 3    Pain Location --  bladder     Therex: PT reassessed outcome measures and progress towards  goals:      [facility] PT Assessment - 04/22/15 0001    Standardized Balance Assessment   Standardized Balance Assessment Berg Balance Test;Timed Up and Go Test;Five Times Sit to Stand;10 meter walk test   Five times sit to stand comments  8.12s   10 Meter Walk 0.66ms with RW  0.823m   Berg Balance Test   Sit to Stand Able to stand without using hands and stabilize independently   Standing Unsupported Able to stand safely 2 minutes   Sitting with Back Unsupported but Feet Supported on Floor or Stool Able to sit safely and securely 2 minutes   Stand to Sit Sits safely with minimal use of hands   Transfers Able to transfer safely, minor use of hands   Standing Unsupported with Eyes Closed Able to stand 10 seconds safely   Standing Ubsupported with Feet Together Able to place feet together independently and stand 1 minute safely   From Standing, Reach Forward with Outstretched Arm Can  reach confidently >25 cm (10")   From Standing Position, Pick up Object from Floor Able to pick up shoe safely and easily   From Standing Position, Turn to Look Behind Over each Shoulder Looks behind one side only/other side shows less weight shift   Turn 360 Degrees Able to turn 360 degrees safely in 4 seconds or less   Standing Unsupported, Alternately Place Feet on Step/Stool Able to stand independently and safely and complete 8 steps in 20 seconds   Standing Unsupported, One Foot in Front Able to plae foot ahead of the other independently and hold 30 seconds   Standing on One Leg Able to lift leg independently and hold 5-10 seconds   Total Score 53   Timed Up and Go Test   Normal TUG (seconds) 12.3  with cane                              PT Education - 04/22/15 0928    Education provided Yes   Education Details PT goals met   Person(s) Educated Patient   Methods Explanation   Comprehension Verbalized understanding             PT Long Term Goals - 04/22/15 0913    PT LONG TERM GOAL #1   Title pt will perform TUG using LRAD in <12 s demonstrating reduced risk of falls.   Time 6   Period Weeks   Status Achieved   PT LONG TERM GOAL #2   Title pt will score 52 or greater on the berg balance test, reducing fall risk.    Time 6   Period Weeks   Status Achieved   PT LONG TERM GOAL #3   Title pt will ambulate using LRAD at least 0.73m54mfor normalized home mobility.    Time 6   Period Weeks   Status Achieved   PT LONG TERM GOAL #4   Title pt will ambulate x [id number] SPC safely to return to PLOF.    Time 6   Period Weeks   Status Achieved               Plan - 04/22/15 [id number] Impression Statement [name] achieved all PT goals at this time and has made significant progress in her balance, strength and general mobility and has returned to her PLOF. pt will be DC from PT at this time as skilled PT  is no longer needed. pt and  husband encouraged to call if they have any questions or concerns.    Pt will benefit from skilled therapeutic intervention in order to improve on the following deficits Abnormal gait;Decreased activity tolerance;Decreased balance;Decreased strength;Impaired sensation;Decreased endurance   Rehab Potential Good   Clinical Impairments Affecting Rehab Potential HTN, DM, hx of anxiety/ depression, previous CVA   PT Frequency 2x / week   PT Duration 6 weeks   PT Treatment/Interventions ADLs/Self Care Home Management;Aquatic Therapy;Balance training;Therapeutic exercise;Therapeutic activities;Gait training;Manual techniques   PT Next Visit Plan progress note   Consulted and Agree with Plan of Care Patient;Family member/caregiver          G-Codes - 05-May-2015 0926    Functional Assessment Tool Used Berg/71malk/TUG   Functional Limitation Mobility: Walking and moving around   Mobility: Walking and Moving Around Current Status [phone] At least 1 percent but less than 20 percent impaired, limited or restricted   Mobility: Walking and Moving Around Goal Status [phone] At least 1 percent but less than 20 percent impaired, limited or restricted   Mobility: Walking and Moving Around Discharge Status [phone] At least 1 percent but less than 20 percent impaired, limited or restricted      Problem List Patient Active Problem List   Diagnosis Date Noted  . Cerebrovascular accident (CVA) due to occlusion of cerebral artery ([place]   . Petechial rash   . Venous hemorrhage   . Petechiae   . Absolute anemia   . Accelerated hypertension 03/11/2015  . Intractable nausea and vomiting 03/11/2015  . Anxiety 03/11/2015  . Stroke ([place] 03/11/2015  . Hand pain 09/09/2014  . Psychomotor retardation 05/25/2014  . Syncope, cardiogenic 05/05/2014  . Tremor 05/05/2014  . Multiple falls 11/28/2013  . Elevated immunoglobulin A 09/02/2013  . Anemia of other chronic disease 09/01/2013  . Barrett's esophagus  08/18/2013  . Other malaise and fatigue 07/29/2013  . Diarrhea 03/18/2013  . DM (diabetes mellitus), type 2 with neurological complications ([place] [id number]/19/[id number] . Bilateral leg pain 07/02/2012  . Urinary incontinence 09/25/2011  . Diaphoresis 12/21/2010  . Leg edema 05/11/2010  . B12 deficiency 05/11/2010  . BENIGN POSITIONAL VERTIGO 01/06/2009  . ABDOMINAL PAIN, LEFT UPPER QUADRANT 12/10/2008  . UNSPECIFIED VITAMIN D DEFICIENCY 09/07/2008  . ROSACEA 09/07/2008  . Hereditary and idiopathic peripheral neuropathy 08/24/2008  . Other chest pain 05/19/2008  . EROSIVE ESOPHAGITIS 02/03/2008  . EROSIVE GASTRITIS 02/03/2008  . Anxiety state 02/14/2007  . OBESITY 11/15/2006  . Extremity pain 09/28/2006  . ADRENAL MASS VIA CT 07/07/2006  . Hypothyroidism 07/05/2006  . HYPERLIPIDEMIA, MIXED 07/05/2006  . Essential hypertension 07/05/2006  . GERD 07/05/2006  . DIVERTICULOSIS, COLON 07/05/2006  . IBS 07/05/2006  . INSOMNIA 07/05/2006   AGorden Harms Tressia Labrum, PT, DPT #[phone] Juwon Scripter 3Mar 29, 2017 9:29 AM  [place] [facility] [address] [address]Phone: [phone]  Fax:  [phone] Name: Elizabeth GENAOMRN: [id number] of Birth: 1November 26, 1952

## 2015-04-22 NOTE — Telephone Encounter (Signed)
Pt left voicemail at Triage requesting 90 day supply of Rx sent to pharmacy, done and pt notified

## 2015-04-23 LAB — URINE CULTURE

## 2015-04-25 ENCOUNTER — Other Ambulatory Visit: Payer: Self-pay | Admitting: Family Medicine

## 2015-04-25 MED ORDER — PANTOPRAZOLE SODIUM 40 MG PO TBEC: 40.0000 mg | DELAYED_RELEASE_TABLET | Freq: Two times a day (BID) | ORAL | Status: AC

## 2015-04-26 ENCOUNTER — Telehealth: Payer: Self-pay

## 2015-04-26 NOTE — Telephone Encounter (Signed)
Elizabeth Rivera with [facility] request clarification of lisinopril dosage and instructions. Left v/m requesting cb.

## 2015-04-26 NOTE — Telephone Encounter (Signed)
Called and notified patient of Elizabeth Rivera's comments. Patient stated that she thought the prescription was for pain not antibotics. So she took them only when she is in pain. Soken to [place] of what the patient said and Anda Kraft stated need office visit to recheck urine.

## 2015-04-26 NOTE — Telephone Encounter (Signed)
Pt left v/m; pt was seen 04/20/15 and since seen pt has taken 2 1/2 boxes of azo. Pt still hurts in "her bottom" and pt is requesting abx for bladder infection. I spoke with pt and she thought the generic Keflex was for pain.pt still has pain and burning when urinates first thing in the mornings. No fever; Dr Jacqlyn Larsen the urologist has moved to [facility] and difficult to get in for appt. Pt still has 4 capsules of generic Keflex. Pt said since stroke about 6 weeks ago pt has short term memory loss. Pt wants to know if should take abx for longer period of time.[facility] [address] Pt request cb.

## 2015-04-26 NOTE — Telephone Encounter (Signed)
Cephalexin was an appropriate treatment for her UTI. Please have her review her instructions from her visit that day as I typed out everything. Please have her complete the Cephalexin pills as written on her instructions. The Phenazopyridine tablets are for her to use for pain. If she is still experiencing symptoms, please have her schedule an appointment to recheck her urine.

## 2015-04-27 ENCOUNTER — Ambulatory Visit: Payer: PPO

## 2015-04-27 ENCOUNTER — Telehealth: Payer: Self-pay | Admitting: Family Medicine

## 2015-04-27 LAB — HM DIABETES EYE EXAM

## 2015-04-27 MED ORDER — LISINOPRIL 40 MG PO TABS: 40.0000 mg | ORAL_TABLET | Freq: Every day | ORAL | Status: DC

## 2015-04-27 NOTE — Telephone Encounter (Signed)
Message left for patient to return my call.  

## 2015-04-27 NOTE — Telephone Encounter (Signed)
Left detailed message on voicemail of Clarise Cruz at [facility] and explained that hard copy was faxed this morning.

## 2015-04-27 NOTE — Telephone Encounter (Signed)
Hailey with [facility] called to verify lisinopril 40 mg taking one daily is the correct dosage and instructions; advised Hailey that is what is on pts current med list. Wynelle Link also wanted to know about previous fax with possible interaction between omeprazole and plavix; Hailey has checked with Dr Benjie Karvonen and he had no problem with it but Hailey wanted to verify with Dr Damita Dunnings that it is OK for pt to be on both meds. Hailey requested cb at # in contact information and will be speaking with Clarise Cruz at that number.

## 2015-04-27 NOTE — Telephone Encounter (Signed)
I prev did the hard copy on this re: ACE dose and change to protonix.   See med list . Thanks.

## 2015-04-27 NOTE — Telephone Encounter (Signed)
Patient called back and notified her that would like to recheck her urine. Patient stated she went to see her uroloigist and was told urine look okay, no infection. Patient stated she feels a bit better than yesterday.

## 2015-04-27 NOTE — Telephone Encounter (Signed)
Spoken to patient and notified her of another issue for Elizabeth Rivera.   However, patient stated she is almost out of Lisinopril 40 mg. Patient said that she thought Dr Damita Dunnings already sent prescription to [facility] mail order pharmacy. In the system it showed that the prescription for Lisinopril 40 mg was sent on 04/21/2015 to [facility] and it was confirmed that the pharmacy received it. Inform patient that she should have her husband call [facility] and check the status of the prescription.   But patient stated that she almost out and would Dr Damita Dunnings sent enough for the week to [facility] on [address]. Please advise.

## 2015-04-27 NOTE — Telephone Encounter (Signed)
Sent locally. Thanks.

## 2015-04-28 ENCOUNTER — Ambulatory Visit
Admission: RE | Admit: 2015-04-28 | Discharge: 2015-04-28 | Disposition: A | Discharge status: Home / Self Care | Payer: PPO | Source: Ambulatory Visit | Attending: Family Medicine | Admitting: Family Medicine

## 2015-04-28 ENCOUNTER — Other Ambulatory Visit: Payer: Self-pay | Admitting: Family Medicine

## 2015-04-28 DIAGNOSIS — Z1231 Encounter for screening mammogram for malignant neoplasm of breast: Secondary | ICD-10-CM

## 2015-04-29 ENCOUNTER — Ambulatory Visit: Payer: PPO

## 2015-04-30 ENCOUNTER — Encounter: Payer: Self-pay | Admitting: *Deleted

## 2015-05-03 ENCOUNTER — Ambulatory Visit: Payer: PPO

## 2015-05-04 ENCOUNTER — Other Ambulatory Visit (INDEPENDENT_AMBULATORY_CARE_PROVIDER_SITE_OTHER): Payer: PPO

## 2015-05-04 ENCOUNTER — Encounter: Payer: Self-pay | Admitting: Family Medicine

## 2015-05-04 ENCOUNTER — Ambulatory Visit (INDEPENDENT_AMBULATORY_CARE_PROVIDER_SITE_OTHER): Payer: PPO | Admitting: Family Medicine

## 2015-05-04 VITALS — BP 120/80 | HR 64 | Temp 97.5°F | Ht 62.5 in | Wt 139.8 lb

## 2015-05-04 DIAGNOSIS — E039 Hypothyroidism, unspecified: Secondary | ICD-10-CM | POA: Diagnosis not present

## 2015-05-04 DIAGNOSIS — E119 Type 2 diabetes mellitus without complications: Secondary | ICD-10-CM

## 2015-05-04 DIAGNOSIS — R300 Dysuria: Secondary | ICD-10-CM | POA: Diagnosis not present

## 2015-05-04 DIAGNOSIS — N390 Urinary tract infection, site not specified: Secondary | ICD-10-CM

## 2015-05-04 LAB — POC URINALSYSI DIPSTICK (AUTOMATED)
Bilirubin, UA: NEGATIVE
Glucose, UA: NEGATIVE
KETONES UA: NEGATIVE
Nitrite, UA: NEGATIVE
PH UA: 6
PROTEIN UA: NEGATIVE
RBC UA: NEGATIVE
SPEC GRAV UA: 1.015
Urobilinogen, UA: 0.2

## 2015-05-04 LAB — TSH: TSH: 0.27 u[IU]/mL — AB (ref 0.35–4.50)

## 2015-05-04 MED ORDER — CIPROFLOXACIN HCL 250 MG PO TABS: 250.0000 mg | ORAL_TABLET | Freq: Two times a day (BID) | ORAL | Status: DC

## 2015-05-04 NOTE — Progress Notes (Signed)
Pre visit review using our clinic review tool, if applicable. No additional management support is needed unless otherwise documented below in the visit note. 

## 2015-05-04 NOTE — Assessment & Plan Note (Signed)
Likely partially treated given missed doses of antibiotics. Will broaden to cipro given pt with subjective fever at night. Pt understands SE risk of CIpro and requests to proceed with use.  Send for culture.

## 2015-05-04 NOTE — Addendum Note (Signed)
Addended by: Marchia Bond on: 05/04/2015 03:04 PM   Modules accepted: Orders

## 2015-05-04 NOTE — Patient Instructions (Addendum)
We will call with urine culture result. Push water as able.  Complete the antibiotics twice daily x 7 days.

## 2015-05-04 NOTE — Addendum Note (Signed)
Addended by: Carter Kitten on: 05/04/2015 02:51 PM   Modules accepted: Orders

## 2015-05-04 NOTE — Progress Notes (Signed)
   Subjective:    Patient ID: Elizabeth Rivera, female    DOB: 1950-08-03, 65 y.o.   MRN: [id number]  HPI   65 year old female pt of Dr. Josefine Class with complicated PM, recent CVA and memory loss presents with new onset pressure in bladder and dysuria x 3 weeks.  She was seen by Allie Bossier on 3/14:  UA positive. U culture positive for Ecoli and Klebsiella  Treated with keflex x 7 days and pyridium.  She reports she was confused about how to take meds.. Thought the antibiotic was the numbing med, so only used as needed!  Realized on 3/21. Saw urologist.. UA was clear, ? If really tested per pt.  She did finally take all the antibitoics.  She is continuing to have dysuria,low abd pain. Subjective fever at night. No flank pain. No hematuria.   Social History /Family History/Past Medical History reviewed and updated if needed.  Review of Systems  Constitutional: Negative for fever and fatigue.  HENT: Negative for ear pain.   Eyes: Negative for pain.  Respiratory: Negative for chest tightness and shortness of breath.   Cardiovascular: Negative for chest pain, palpitations and leg swelling.  Gastrointestinal: Positive for abdominal pain.  Genitourinary: Positive for dysuria, urgency and frequency.       Objective:   Physical Exam  Constitutional: Vital signs are normal. She appears well-developed and well-nourished. She is cooperative.  Non-toxic appearance. She does not appear ill. No distress.  HENT:  Head: Normocephalic.  Right Ear: Hearing, tympanic membrane, external ear and ear canal normal. Tympanic membrane is not erythematous, not retracted and not bulging.  Left Ear: Hearing, tympanic membrane, external ear and ear canal normal. Tympanic membrane is not erythematous, not retracted and not bulging.  Nose: No mucosal edema or rhinorrhea. Right sinus exhibits no maxillary sinus tenderness and no frontal sinus tenderness. Left sinus exhibits no maxillary sinus tenderness and no  frontal sinus tenderness.  Mouth/Throat: Uvula is midline, oropharynx is clear and moist and mucous membranes are normal.  Eyes: Conjunctivae, EOM and lids are normal. Pupils are equal, round, and reactive to light. Lids are everted and swept, no foreign bodies found.  Neck: Trachea normal and normal range of motion. Neck supple. Carotid bruit is not present. No thyroid mass and no thyromegaly present.  Cardiovascular: Normal rate, regular rhythm, S1 normal, S2 normal, normal heart sounds, intact distal pulses and normal pulses.  Exam reveals no gallop and no friction rub.   No murmur heard. Pulmonary/Chest: Effort normal and breath sounds normal. No tachypnea. No respiratory distress. She has no decreased breath sounds. She has no wheezes. She has no rhonchi. She has no rales.  Abdominal: Soft. Normal appearance and bowel sounds are normal. There is tenderness in the right lower quadrant and suprapubic area. There is no rigidity, no rebound, no guarding and no CVA tenderness.  Neurological: She is alert.  Skin: Skin is warm, dry and intact. No rash noted.  Psychiatric: Her speech is normal and behavior is normal. Judgment and thought content normal. Her mood appears not anxious. Cognition and memory are normal. She does not exhibit a depressed mood.          Assessment & Plan:

## 2015-05-05 ENCOUNTER — Ambulatory Visit: Payer: PPO

## 2015-05-06 LAB — URINE CULTURE: Colony Count: >=100000

## 2015-05-10 ENCOUNTER — Telehealth: Payer: Self-pay | Admitting: Family Medicine

## 2015-05-10 ENCOUNTER — Other Ambulatory Visit: Payer: Self-pay | Admitting: Family Medicine

## 2015-05-10 DIAGNOSIS — E039 Hypothyroidism, unspecified: Secondary | ICD-10-CM

## 2015-05-10 MED ORDER — LEVOFLOXACIN 500 MG PO TABS: 500.0000 mg | ORAL_TABLET | Freq: Every day | ORAL | Status: DC

## 2015-05-10 NOTE — Telephone Encounter (Signed)
Called patient back and lab results were given to her.

## 2015-05-10 NOTE — Telephone Encounter (Signed)
Pt called back about bladder he was seen by Dr Diona Browner and found out that it is infected  cb [phone]

## 2015-05-10 NOTE — Telephone Encounter (Signed)
Noted. Agreed, thanks. See below from Dr. Loletha Grayer.

## 2015-05-10 NOTE — Telephone Encounter (Signed)
Please call and triage.  

## 2015-05-10 NOTE — Telephone Encounter (Signed)
Appears sensitive to cipro?? Unclear why sx now  Change to lvq  Call in  Levaquin 500 mg, 1 po x 7 days, #7, 0 ref

## 2015-05-10 NOTE — Telephone Encounter (Signed)
Left message for Elizabeth Rivera to return my call.

## 2015-05-10 NOTE — Telephone Encounter (Signed)
Pt requesting cb regarding recent thyroid labs please call [phone] Thanks

## 2015-05-10 NOTE — Telephone Encounter (Signed)
Left message on answering machine to call back.

## 2015-05-10 NOTE — Telephone Encounter (Signed)
See lab 05/04/15 urine culture result note; pt seen 05/04/15 and pt  finished cipro this morning; pt continues to have pain upon urination; frequency of urine;pt voiding q 2-3 hours; pt having fevers of 100-101 at night until last night and no fever last night. [facility] [address] Pt request cb.

## 2015-05-11 ENCOUNTER — Ambulatory Visit: Payer: PPO

## 2015-05-13 ENCOUNTER — Ambulatory Visit: Payer: PPO

## 2015-05-13 ENCOUNTER — Telehealth: Payer: Self-pay | Admitting: Family Medicine

## 2015-05-13 ENCOUNTER — Encounter: Payer: Self-pay | Admitting: Family Medicine

## 2015-05-13 DIAGNOSIS — N3020 Other chronic cystitis without hematuria: Secondary | ICD-10-CM

## 2015-05-13 NOTE — Telephone Encounter (Signed)
Pt called wanting to get a referral to urology She stated she has had a uti for 5 weeks Her urology dr doesn't take her insurance now  She would like to go to [place]

## 2015-05-14 MED ORDER — CIPROFLOXACIN HCL 250 MG PO TABS: 250.0000 mg | ORAL_TABLET | Freq: Two times a day (BID) | ORAL | Status: DC

## 2015-05-14 NOTE — Telephone Encounter (Signed)
Husband advised. 

## 2015-05-14 NOTE — Telephone Encounter (Signed)
Referral done.  I don't know if there are any other uro clinics in [place].  See mychart message.   Please call pt.   If still with dysuria, then okay to extend the cipro rx.  I went ahead and sent that in.  If still with sig leg edema after elevation, okay to take an extra dose of HCTZ once to see if that will help.  Thanks.

## 2015-05-18 ENCOUNTER — Encounter: Payer: Self-pay | Admitting: Urology

## 2015-05-18 ENCOUNTER — Ambulatory Visit (INDEPENDENT_AMBULATORY_CARE_PROVIDER_SITE_OTHER): Payer: PPO | Admitting: Urology

## 2015-05-18 VITALS — BP 153/80 | HR 69 | Ht 62.0 in | Wt 139.0 lb

## 2015-05-18 DIAGNOSIS — Z86010 Personal history of colonic polyps: Secondary | ICD-10-CM | POA: Insufficient documentation

## 2015-05-18 DIAGNOSIS — N3020 Other chronic cystitis without hematuria: Secondary | ICD-10-CM | POA: Diagnosis not present

## 2015-05-18 LAB — MICROSCOPIC EXAMINATION: RBC MICROSCOPIC, UA: NONE SEEN /HPF (ref 0–?)

## 2015-05-18 LAB — URINALYSIS, COMPLETE
Bilirubin, UA: NEGATIVE
Glucose, UA: NEGATIVE
Ketones, UA: NEGATIVE
LEUKOCYTES UA: NEGATIVE
NITRITE UA: POSITIVE — AB
PROTEIN UA: NEGATIVE
RBC UA: NEGATIVE
SPEC GRAV UA: 1.01 (ref 1.005–1.030)
UUROB: 0.2 mg/dL (ref 0.2–1.0)
pH, UA: 5 (ref 5.0–7.5)

## 2015-05-18 LAB — BLADDER SCAN AMB NON-IMAGING: SCAN RESULT: 207

## 2015-05-18 MED ORDER — FLUCONAZOLE 150 MG PO TABS
150.0000 mg | ORAL_TABLET | Freq: Every day | ORAL | Status: DC
Start: 2015-05-18 — End: 2015-06-14

## 2015-05-18 NOTE — Progress Notes (Signed)
05/18/2015 10:07 AM   Elizabeth Rivera 08/16/1950 [id number]  Referring provider: Tonia Ghent, MD [address] [place], [place] [address]  Chief Complaint  Patient presents with  . Cystitis    New Patient    HPI: Ms Elizabeth Rivera is a 65yo seen in consultation today for recurrent UTIs. She has numerous culture proven UTIs over the past 15 years. She was previously seen by Dr. Jacqlyn Larsen but she is looking to establish care at [facility]. She has a hx of MUS. She had a stroke 8 weeks ago. She now wears depends due urge incontinence. She has irritation from the pads Nocturia 3-5x.  She is currently on cipro. She developed dysuria and worsening incontinence.  She has never taken prophylactic antibiotics.    PMH: Past Medical History  Diagnosis Date  . Thyroid disease     hypothyroid  . Anxiety   . Hyperlipidemia   . Hypertension   . Other chest pain     chest discomfort  . Obesity   . Neuropathy ([place])   . Other esophagitis     erosive esophagitis  . Other specified gastritis without mention of hemorrhage     erosvie gastritis  . Pain in limb     Right leg pain  . Other specified disorders of adrenal glands 07/2007    adrenal mass via of CT 1.4cm left Adrenal no change(Dr.Cope)   . Other abnormal blood chemistry   . Diverticulosis of colon (without mention of hemorrhage) 05/20/2009    Internal hemms (Dr. Vira Agar)  . Irritable bowel syndrome     history of  . Insomnia, unspecified   . Nervous breakdown 08/2002    [facility]  . GERD (gastroesophageal reflux disease)     reflux HH 09/01/2002//egd reflux Esoph. HH Gastritis nonbleeding erosive gastropathy Duodentitis 08/15/2006  . Hyperglycemia   . Candidal vaginitis   . Forearm fracture     2013, left  . Anemia   . Anemia of other chronic disease 09/01/2013  . Diabetes mellitus without complication ([place])   . Stroke ([place]) 03/2014  . Stroke [facility]) 03/2015    Surgical History: Past Surgical History  Procedure Laterality  Date  . Cholecystectomy  03/2004  . Abdominal hysterectomy  1995    Part Hyst BSO DUB ovarian cyst B9  . Vaginal delivery  1977  . Breast reduction surgery  1984  . Cystoscopy  01/08/2008    Former site of inflammation healed (Dr. Jacqlyn Larsen)  . Nsvd x1  1977  . Esophagogastroduodenoscopy  1. 09/01/02  2. 08/15/06    1. Reflux, HH  2. Reflux esoph HH gaastritis nonbleeding erosive gastropathy duodenitis  . Colonoscopy  1. 08/15/06  2. 05/20/09    Int Hemms  2. Divertics Int Hemms (Dr. Vira Agar)  . Ct abd w & pelvis wo cm  6/09    CT Scan abd stable adrenal adenoma 1.4cm left adrenal no change (Dr. Jacqlyn Larsen)  . Breast cyst aspiration Bilateral   . Reduction mammaplasty  1985    Home Medications:    Medication List       This list is accurate as of: 05/18/15 10:07 AM.  Always use your most recent med list.               ALPRAZolam 0.25 MG tablet  Commonly known as:  XANAX  Take 1 tablet (0.25 mg total) by mouth 2 (two) times daily.     amLODipine 10 MG tablet  Commonly known as:  NORVASC  Take 1 tablet (10 mg total) by mouth daily.     atorvastatin 40 MG tablet  Commonly known as:  LIPITOR  Take 1 tablet (40 mg total) by mouth daily at 6 PM.     cholecalciferol 1000 units tablet  Commonly known as:  VITAMIN D  Take 1,000 Units by mouth daily.     ciprofloxacin 250 MG tablet  Commonly known as:  CIPRO  Take 1 tablet (250 mg total) by mouth 2 (two) times daily.     clopidogrel 75 MG tablet  Commonly known as:  PLAVIX  Take 1 tablet (75 mg total) by mouth daily.     ferrous sulfate 325 (65 FE) MG tablet  Commonly known as:  [employer] IRON  Take 1 tablet (325 mg total) by mouth daily with breakfast.     hydrochlorothiazide 25 MG tablet  Commonly known as:  HYDRODIURIL  Take 1 tablet (25 mg total) by mouth daily.     insulin glargine 100 UNIT/ML injection  Commonly known as:  LANTUS  Inject 9 Units into the skin at bedtime.     levofloxacin 500 MG tablet  Commonly known as:   LEVAQUIN  Take 1 tablet (500 mg total) by mouth daily.     levothyroxine 125 MCG tablet  Commonly known as:  SYNTHROID, LEVOTHROID  Take 1 tablet (125 mcg total) by mouth daily before breakfast.     LINZESS 145 MCG Caps capsule  Generic drug:  Linaclotide  TK ONE C PO ONCE D     lisinopril 40 MG tablet  Commonly known as:  PRINIVIL,ZESTRIL  Take 1 tablet (40 mg total) by mouth daily.     Melatonin 5 MG Tabs  Take 5 mg by mouth at bedtime.     metFORMIN 1000 MG tablet  Commonly known as:  GLUCOPHAGE  Take 0.5 tablets (500 mg total) by mouth 2 (two) times daily with a meal.     metoprolol succinate 100 MG 24 hr tablet  Commonly known as:  TOPROL-XL  Take 150 mg by mouth daily. Take with or immediately following a meal.     pantoprazole 40 MG tablet  Commonly known as:  PROTONIX  Take 1 tablet (40 mg total) by mouth 2 (two) times daily.     pregabalin 150 MG capsule  Commonly known as:  LYRICA  Take 150 mg by mouth every morning.     pregabalin 75 MG capsule  Commonly known as:  LYRICA  Take 75 mg by mouth at bedtime.     ranitidine 300 MG tablet  Commonly known as:  ZANTAC  Take 1 tablet (300 mg total) by mouth daily.     venlafaxine XR 150 MG 24 hr capsule  Commonly known as:  EFFEXOR-XR  Take 1 capsule (150 mg total) by mouth 2 (two) times daily.     vitamin B-12 1000 MCG tablet  Commonly known as:  CYANOCOBALAMIN  Take 1,000 mcg by mouth daily.        Allergies:  Allergies  Allergen Reactions  . Sulfa Antibiotics Rash    Family History: Family History  Problem Relation Age of Onset  . Hypertension Mother   . Hyperlipidemia Mother   . Stroke Mother     52  . Vision loss Father     vision problems  . Cancer Father     Colon   . Hyperlipidemia Sister   . Hypertension Sister   . Vision loss Sister   . Breast  cancer Maternal Grandmother 7  . Breast cancer Cousin     paternal cousin  . Bladder Cancer Neg Hx   . Prostate cancer Paternal  Grandfather   . Kidney cancer Neg Hx     Social History:  reports that she has never smoked. She has never used smokeless tobacco. She reports that she does not drink alcohol or use illicit drugs.  ROS: UROLOGY Frequent Urination?: Yes Hard to postpone urination?: Yes Burning/pain with urination?: Yes Get up at night to urinate?: Yes Leakage of urine?: Yes Urine stream starts and stops?: No Trouble starting stream?: Yes Do you have to strain to urinate?: No Blood in urine?: No Urinary tract infection?: Yes Sexually transmitted disease?: No Injury to kidneys or bladder?: No Painful intercourse?: No Weak stream?: Yes Currently pregnant?: No Vaginal bleeding?: No Last menstrual period?: n  Gastrointestinal Nausea?: No Vomiting?: No Indigestion/heartburn?: Yes Diarrhea?: No Constipation?: Yes  Constitutional Fever: No Night sweats?: No Weight loss?: No Fatigue?: Yes  Skin Skin rash/lesions?: No Itching?: No  Eyes Blurred vision?: No Double vision?: No  Ears/Nose/Throat Sore throat?: No Sinus problems?: Yes  Hematologic/Lymphatic Swollen glands?: No Easy bruising?: Yes  Cardiovascular Leg swelling?: Yes Chest pain?: No  Respiratory Cough?: Yes Shortness of breath?: No  Endocrine Excessive thirst?: No  Musculoskeletal Back pain?: No Joint pain?: Yes  Neurological Headaches?: Yes Dizziness?: No  Psychologic Depression?: Yes Anxiety?: Yes  Physical Exam: BP 153/80 mmHg  Pulse 69  Ht 5\' 2"  (1.575 m)  Wt 63.05 kg (139 lb)  BMI 25.42 kg/m2  Constitutional:  Alert and oriented, No acute distress. HEENT:  AT, moist mucus membranes.  Trachea midline, no masses. Cardiovascular: No clubbing, cyanosis, or edema. Respiratory: Normal respiratory effort, no increased work of breathing. GI: Abdomen is soft, nontender, nondistended, no abdominal masses GU: No CVA tenderness. Bladder palpable Skin: No rashes, bruises or suspicious lesions. Lymph:  No cervical or inguinal adenopathy. Neurologic: Grossly intact, no focal deficits, moving all 4 extremities. Psychiatric: Normal mood and affect.  Laboratory Data: Lab Results  Component Value Date   WBC 5.1 04/02/2015   HGB 10.5* 04/02/2015   HCT 31.0* 04/02/2015   MCV 83.5 04/02/2015   PLT 171 04/02/2015    Lab Results  Component Value Date   CREATININE 0.82 04/08/2015    No results found for: PSA  No results found for: TESTOSTERONE  Lab Results  Component Value Date   HGBA1C 7.4* 03/12/2015    Urinalysis    Component Value Date/Time   COLORURINE YELLOW* 03/11/2015 1227   COLORURINE Straw 06/03/2014 1446   APPEARANCEUR CLEAR* 03/11/2015 1227   APPEARANCEUR Hazy 06/03/2014 1446   LABSPEC 1.010 03/11/2015 1227   LABSPEC 1.010 06/03/2014 1446   PHURINE 7.0 03/11/2015 1227   PHURINE 6.0 06/03/2014 1446   GLUCOSEU >500* 03/11/2015 1227   GLUCOSEU >=500 06/03/2014 1446   HGBUR NEGATIVE 03/11/2015 1227   HGBUR Negative 06/03/2014 1446   BILIRUBINUR negative 05/04/2015 1430   BILIRUBINUR NEGATIVE 03/11/2015 1227   BILIRUBINUR Negative 06/03/2014 1446   KETONESUR TRACE* 03/11/2015 1227   KETONESUR Negative 06/03/2014 1446   PROTEINUR negative 05/04/2015 1430   PROTEINUR >500* 03/11/2015 1227   PROTEINUR Negative 06/03/2014 1446   UROBILINOGEN 0.2 05/04/2015 1430   UROBILINOGEN 0.2 04/03/2008 1410   NITRITE negative 05/04/2015 1430   NITRITE NEGATIVE 03/11/2015 1227   NITRITE Negative 06/03/2014 1446   LEUKOCYTESUR moderate (2+)* 05/04/2015 1430   LEUKOCYTESUR 1+ 06/03/2014 1446    Pertinent Imaging: PVR  Assessment &  Plan:    1. Chronic cystitis -continue antibiotics until finished - Urinalysis, Complete  2. Urinary retention  -16 french foley placed today -RTC 1 month for a voiding trial   No Follow-up on file.  Nicolette Bang, MD  [facility] [address] [place], [place] [address] [phone]

## 2015-05-18 NOTE — Progress Notes (Signed)
Bladder Scan Patient  void: 207 ml Performed By: [facility] Catheter Placement  Due to urinary retention patient is present today for a foley cath placement.  Patient was cleaned and prepped in a sterile fashion with betadine and lidocaine jelly 2% was instilled into the urethra.  A 16  FR foley catheter was inserted, urine return was noted  225 ml, urine was orange  in color.  The balloon was filled with 10cc of sterile water.  A leg bag was attached for drainage. Patient was also given a night bag to take home and was given instruction on how to change from one bag to another.  Patient was given instruction on proper catheter care.  Patient tolerated well, no complications were noted   Preformed by: Vickki Hearing  Additional notes/ Follow up: 1 mth

## 2015-05-24 ENCOUNTER — Telehealth: Payer: Self-pay

## 2015-05-24 NOTE — Telephone Encounter (Signed)
Pt called earlier requesting belly urine bag vs leg bag. I called pt back no answer. LMOM to return my call.

## 2015-05-27 ENCOUNTER — Encounter: Payer: Self-pay | Admitting: Family Medicine

## 2015-06-08 ENCOUNTER — Telehealth: Payer: Self-pay

## 2015-06-08 NOTE — Telephone Encounter (Signed)
Open in error

## 2015-06-09 ENCOUNTER — Other Ambulatory Visit (INDEPENDENT_AMBULATORY_CARE_PROVIDER_SITE_OTHER): Payer: PPO

## 2015-06-09 DIAGNOSIS — E039 Hypothyroidism, unspecified: Secondary | ICD-10-CM | POA: Diagnosis not present

## 2015-06-09 LAB — TSH: TSH: 0.18 u[IU]/mL — ABNORMAL LOW (ref 0.35–4.50)

## 2015-06-11 ENCOUNTER — Other Ambulatory Visit: Payer: Self-pay

## 2015-06-11 DIAGNOSIS — B3731 Acute candidiasis of vulva and vagina: Secondary | ICD-10-CM

## 2015-06-11 DIAGNOSIS — B373 Candidiasis of vulva and vagina: Secondary | ICD-10-CM

## 2015-06-11 MED ORDER — TERCONAZOLE 0.4 % VA CREA: 1.0000 | TOPICAL_CREAM | Freq: Every day | VAGINAL | Status: AC

## 2015-06-11 NOTE — Telephone Encounter (Signed)
Pt called complaining of a possible yeast infection itching, burning and yellowish discharge. She states she took a diflucan x4 weeks ago and symptoms improved , but returned x 3 days ago. I spoke w/ Larene [place] and she instructed me to call in Terazol Cream x 7days. The pt was notified and prescription sent to her pharmacy.

## 2015-06-13 ENCOUNTER — Other Ambulatory Visit: Payer: Self-pay | Admitting: Family Medicine

## 2015-06-13 DIAGNOSIS — E039 Hypothyroidism, unspecified: Secondary | ICD-10-CM

## 2015-06-14 ENCOUNTER — Ambulatory Visit: Payer: PPO

## 2015-06-14 ENCOUNTER — Telehealth: Payer: Self-pay | Admitting: Family Medicine

## 2015-06-14 ENCOUNTER — Other Ambulatory Visit: Payer: Self-pay | Admitting: Family Medicine

## 2015-06-14 ENCOUNTER — Encounter: Payer: Self-pay | Admitting: Family Medicine

## 2015-06-14 ENCOUNTER — Ambulatory Visit (INDEPENDENT_AMBULATORY_CARE_PROVIDER_SITE_OTHER): Payer: PPO | Admitting: Urology

## 2015-06-14 VITALS — BP 176/93 | HR 60 | Ht 62.5 in | Wt 134.0 lb

## 2015-06-14 DIAGNOSIS — N3020 Other chronic cystitis without hematuria: Secondary | ICD-10-CM

## 2015-06-14 DIAGNOSIS — N3941 Urge incontinence: Secondary | ICD-10-CM | POA: Diagnosis not present

## 2015-06-14 DIAGNOSIS — E039 Hypothyroidism, unspecified: Secondary | ICD-10-CM

## 2015-06-14 MED ORDER — LEVOTHYROXINE SODIUM 112 MCG PO TABS: 112.0000 ug | ORAL_TABLET | Freq: Every day | ORAL | Status: DC

## 2015-06-14 NOTE — Telephone Encounter (Signed)
Patient received her lab results back for her thyroid and it's 0.18.  So she wants to know what medication she needs to change to get that number up.

## 2015-06-14 NOTE — Telephone Encounter (Signed)
See result noted.  Update me.  Thanks.

## 2015-06-14 NOTE — Progress Notes (Signed)
06/14/2015 9:12 AM   [name] [birth year] [id number]  Referring provider: Tonia Ghent, MD [address] [place], [place] [address]  Chief Complaint  Patient presents with  . Urinary Retention    67month    HPI: The patient is seen today for follow-up of recurrent urinary tract infections and urinary retention. The patient had a stroke approximately 2 months ago. She had worsening urinary incontinence, specifically urge incontinence. She was relegated to wearing a depends diaper. She was found to have a PVR of 200 mL's. When given options she opted for a Foley catheter area to presents today for catheter removal. The patient has a past medical history of a mid urethral sling, she states this made her urge incontinence worse. The patient tolerated the Foley catheter reasonably well. She is a retired removed today.     PMH: Past Medical History  Diagnosis Date  . Thyroid disease     hypothyroid  . Anxiety   . Hyperlipidemia   . Hypertension   . Other chest pain     chest discomfort  . Obesity   . Neuropathy ([place])   . Other esophagitis     erosive esophagitis  . Other specified gastritis without mention of hemorrhage     erosvie gastritis  . Pain in limb     Right leg pain  . Other specified disorders of adrenal glands 07/2007    adrenal mass via of CT 1.4cm left Adrenal no change(Dr.Cope)   . Other abnormal blood chemistry   . Diverticulosis of colon (without mention of hemorrhage) 05/20/2009    Internal hemms (Dr. Vira Agar)  . Irritable bowel syndrome     history of  . Insomnia, unspecified   . Nervous breakdown 08/2002    [facility]  . GERD (gastroesophageal reflux disease)     reflux HH 09/01/2002//egd reflux Esoph. HH Gastritis nonbleeding erosive gastropathy Duodentitis 08/15/2006  . Hyperglycemia   . Candidal vaginitis   . Forearm fracture     2013, left  . Anemia   . Anemia of other chronic disease 09/01/2013  . Diabetes mellitus without  complication ([place])   . Stroke ([place]) 03/2014  . Stroke [facility]) 03/2015    Surgical History: Past Surgical History  Procedure Laterality Date  . Cholecystectomy  03/2004  . Abdominal hysterectomy  1995    Part Hyst BSO DUB ovarian cyst B9  . Vaginal delivery  1977  . Breast reduction surgery  1984  . Cystoscopy  01/08/2008    Former site of inflammation healed (Dr. Jacqlyn Larsen)  . Nsvd x1  1977  . Esophagogastroduodenoscopy  1. 09/01/02  2. 08/15/06    1. Reflux, HH  2. Reflux esoph HH gaastritis nonbleeding erosive gastropathy duodenitis  . Colonoscopy  1. 08/15/06  2. 05/20/09    Int Hemms  2. Divertics Int Hemms (Dr. Vira Agar)  . Ct abd w & pelvis wo cm  6/09    CT Scan abd stable adrenal adenoma 1.4cm left adrenal no change (Dr. Jacqlyn Larsen)  . Breast cyst aspiration Bilateral   . Reduction mammaplasty  1985    Home Medications:    Medication List       This list is accurate as of: 06/14/15  9:12 AM.  Always use your most recent med list.               ALPRAZolam 0.25 MG tablet  Commonly known as:  XANAX  Take 1 tablet (0.25 mg total) by mouth 2 (  two) times daily.     amLODipine 10 MG tablet  Commonly known as:  NORVASC  Take 1 tablet (10 mg total) by mouth daily.     atorvastatin 40 MG tablet  Commonly known as:  LIPITOR  Take 1 tablet (40 mg total) by mouth daily at 6 PM.     cholecalciferol 1000 units tablet  Commonly known as:  VITAMIN D  Take 1,000 Units by mouth daily.     clopidogrel 75 MG tablet  Commonly known as:  PLAVIX  Take 1 tablet (75 mg total) by mouth daily.     ferrous sulfate 325 (65 FE) MG tablet  Commonly known as:  [employer]S IRON  Take 1 tablet (325 mg total) by mouth daily with breakfast.     hydrochlorothiazide 25 MG tablet  Commonly known as:  HYDRODIURIL  Take 1 tablet (25 mg total) by mouth daily.     insulin glargine 100 UNIT/ML injection  Commonly known as:  LANTUS  Inject 9 Units into the skin at bedtime.     levothyroxine 125 MCG tablet  Commonly  known as:  SYNTHROID, LEVOTHROID  Take 1 tablet (125 mcg total) by mouth daily before breakfast.     LINZESS 145 MCG Caps capsule  Generic drug:  linaclotide  TK ONE C PO ONCE D     lisinopril 40 MG tablet  Commonly known as:  PRINIVIL,ZESTRIL  Take 1 tablet (40 mg total) by mouth daily.     Melatonin 5 MG Tabs  Take 5 mg by mouth at bedtime.     metFORMIN 1000 MG tablet  Commonly known as:  GLUCOPHAGE  Take 0.5 tablets (500 mg total) by mouth 2 (two) times daily with a meal.     metoprolol succinate 100 MG 24 hr tablet  Commonly known as:  TOPROL-XL  Take 150 mg by mouth daily. Take with or immediately following a meal.     pantoprazole 40 MG tablet  Commonly known as:  PROTONIX  Take 1 tablet (40 mg total) by mouth 2 (two) times daily.     pregabalin 150 MG capsule  Commonly known as:  LYRICA  Take 150 mg by mouth every morning.     pregabalin 75 MG capsule  Commonly known as:  LYRICA  Take 75 mg by mouth at bedtime.     ranitidine 300 MG tablet  Commonly known as:  ZANTAC  Take 1 tablet (300 mg total) by mouth daily.     terconazole 0.4 % vaginal cream  Commonly known as:  TERAZOL 7  Place 1 applicator vaginally at bedtime.     venlafaxine XR 150 MG 24 hr capsule  Commonly known as:  EFFEXOR-XR  Take 1 capsule (150 mg total) by mouth 2 (two) times daily.     vitamin B-12 1000 MCG tablet  Commonly known as:  CYANOCOBALAMIN  Take 1,000 mcg by mouth daily.        Allergies:  Allergies  Allergen Reactions  . Sulfa Antibiotics Rash    Family History: Family History  Problem Relation Age of Onset  . Hypertension Mother   . Hyperlipidemia Mother   . Stroke Mother     52  . Vision loss Father     vision problems  . Cancer Father     Colon   . Hyperlipidemia Sister   . Hypertension Sister   . Vision loss Sister   . Breast cancer Maternal Grandmother 20  . Breast cancer Cousin  paternal cousin  . Bladder Cancer Neg Hx   . Prostate cancer  Paternal Grandfather   . Kidney cancer Neg Hx     Social History:  reports that she has never smoked. She has never used smokeless tobacco. She reports that she does not drink alcohol or use illicit drugs.  ROS: UROLOGY Frequent Urination?: No Hard to postpone urination?: No Burning/pain with urination?: No Get up at night to urinate?: No Leakage of urine?: No Urine stream starts and stops?: No Trouble starting stream?: No Do you have to strain to urinate?: No Blood in urine?: No Urinary tract infection?: No Sexually transmitted disease?: No Injury to kidneys or bladder?: No Painful intercourse?: No Weak stream?: No Currently pregnant?: No Vaginal bleeding?: No Last menstrual period?: n  Gastrointestinal Nausea?: No Vomiting?: No Indigestion/heartburn?: No Diarrhea?: No Constipation?: No  Constitutional Fever: No Night sweats?: No Weight loss?: No Fatigue?: Yes  Skin Skin rash/lesions?: No Itching?: No  Eyes Blurred vision?: No Double vision?: No  Ears/Nose/Throat Sore throat?: No Sinus problems?: No  Hematologic/Lymphatic Swollen glands?: No Easy bruising?: Yes  Cardiovascular Leg swelling?: Yes Chest pain?: No  Respiratory Cough?: No Shortness of breath?: No  Endocrine Excessive thirst?: No  Musculoskeletal Back pain?: No Joint pain?: No  Neurological Headaches?: No Dizziness?: No  Psychologic Depression?: Yes Anxiety?: Yes  Physical Exam: BP 176/93 mmHg  Pulse 60  Ht 5' 2.5" (1.588 m)  Wt 60.782 kg (134 lb)  BMI 24.10 kg/m2  Constitutional:  Alert and oriented, No acute distress.   Laboratory Data: Lab Results  Component Value Date   WBC 5.1 04/02/2015   HGB 10.5* 04/02/2015   HCT 31.0* 04/02/2015   MCV 83.5 04/02/2015   PLT 171 04/02/2015    Lab Results  Component Value Date   CREATININE 0.82 04/08/2015    No results found for: PSA  No results found for: TESTOSTERONE  Lab Results  Component Value Date    HGBA1C 7.4* 03/12/2015    Urinalysis    Component Value Date/Time   COLORURINE YELLOW* 03/11/2015 1227   COLORURINE Straw 06/03/2014 1446   APPEARANCEUR Clear 05/18/2015 0948   APPEARANCEUR CLEAR* 03/11/2015 1227   APPEARANCEUR Hazy 06/03/2014 1446   LABSPEC 1.010 03/11/2015 1227   LABSPEC 1.010 06/03/2014 1446   PHURINE 7.0 03/11/2015 1227   PHURINE 6.0 06/03/2014 1446   GLUCOSEU Negative 05/18/2015 0948   GLUCOSEU >=500 06/03/2014 1446   HGBUR NEGATIVE 03/11/2015 1227   HGBUR Negative 06/03/2014 1446   BILIRUBINUR Negative 05/18/2015 0948   BILIRUBINUR negative 05/04/2015 1430   BILIRUBINUR NEGATIVE 03/11/2015 1227   BILIRUBINUR Negative 06/03/2014 1446   KETONESUR TRACE* 03/11/2015 1227   KETONESUR Negative 06/03/2014 1446   PROTEINUR Negative 05/18/2015 0948   PROTEINUR negative 05/04/2015 1430   PROTEINUR >500* 03/11/2015 1227   PROTEINUR Negative 06/03/2014 1446   UROBILINOGEN 0.2 05/04/2015 1430   UROBILINOGEN 0.2 04/03/2008 1410   NITRITE Positive* 05/18/2015 0948   NITRITE negative 05/04/2015 1430   NITRITE NEGATIVE 03/11/2015 1227   NITRITE Negative 06/03/2014 1446   LEUKOCYTESUR Negative 05/18/2015 0948   LEUKOCYTESUR moderate (2+)* 05/04/2015 1430   LEUKOCYTESUR 1+ 06/03/2014 1446    Pertinent Imaging:  Assessment & Plan:  The patient has incomplete bladder emptying and urinary urge incontinence. The catheter was removed and the patient passed her voiding trial today. She is encouraged to follow-up later this afternoon if she gets the point where she is no longer able to void. I went over behavior modifications with  the patient including double voiding and timed voiding to reduce her postvoid residuals. I also encouraged the patient to consider a probiotic such as lactobacillus on a daily basis and cranberry tablets twice daily. I think the patient should come back within a month to reevaluate her symptoms and see if there is any more we can do to help minimize  her infections and her residuals. She may require urodynamics in the future.  There are no diagnoses linked to this encounter.  No Follow-up on file.  Ardis Hughs, [facility] [address], [place], [place] [address] [phone]

## 2015-06-14 NOTE — Progress Notes (Signed)
Catheter Removal  Patient is present today for a catheter removal.  10ml of water was drained from the balloon. A 16FR foley cath was removed from the bladder no complications were noted . Patient tolerated well.  Preformed by: Julieann Drummonds, CMA   

## 2015-06-15 ENCOUNTER — Encounter: Payer: Self-pay | Admitting: Family Medicine

## 2015-06-16 ENCOUNTER — Telehealth: Payer: Self-pay | Admitting: Urology

## 2015-06-16 DIAGNOSIS — N952 Postmenopausal atrophic vaginitis: Secondary | ICD-10-CM

## 2015-06-16 NOTE — Telephone Encounter (Signed)
Pt would like [place] to call her in a prescription of Estrace Cream, also she has some questions.  [phone]

## 2015-06-16 NOTE — Telephone Encounter (Signed)
The pt was notified that the belly urine bag is for suprapubic cath. The pt verbalize understanding.

## 2015-06-17 ENCOUNTER — Ambulatory Visit: Payer: PPO

## 2015-06-17 ENCOUNTER — Other Ambulatory Visit: Payer: Self-pay

## 2015-06-17 MED ORDER — ESTRADIOL 0.1 MG/GM VA CREA: 1.0000 | TOPICAL_CREAM | Freq: Every day | VAGINAL | Status: DC

## 2015-06-17 NOTE — Telephone Encounter (Signed)
Spoke with pt in reference to Estrace cream. Pt states that a script cost $83 and she cant afford that. Made pt aware of compounded cream that can be called into [facility]. Pt stated that she would like to speak with her husband before calling medication into [facility].

## 2015-06-17 NOTE — Telephone Encounter (Signed)
Pt called back requesting estrace cream be sent to [facility]. Medication was sent to pharmacy.

## 2015-06-17 NOTE — Telephone Encounter (Signed)
Pt left v/m requesting [facility]ol [facility]. Left /vm requesting cb to verify dosage and how taking med.

## 2015-06-17 NOTE — Telephone Encounter (Signed)
No answer

## 2015-06-21 ENCOUNTER — Telehealth: Payer: Self-pay | Admitting: Urology

## 2015-06-21 DIAGNOSIS — N390 Urinary tract infection, site not specified: Secondary | ICD-10-CM

## 2015-06-21 NOTE — Telephone Encounter (Signed)
Spoke with pt in reference to possible UTI. Pt will RTC tomorrow as a nurse visit for a cath specimen.

## 2015-06-21 NOTE — Telephone Encounter (Signed)
Pt called and thinks she still has a UTI.  Is having pain in bottom (pee section, she said)  She is having frequency with cloudy urine.  [phone]

## 2015-06-22 ENCOUNTER — Ambulatory Visit (INDEPENDENT_AMBULATORY_CARE_PROVIDER_SITE_OTHER): Payer: PPO

## 2015-06-22 VITALS — BP 154/80 | HR 71 | Temp 98.0°F | Wt 133.1 lb

## 2015-06-22 DIAGNOSIS — N390 Urinary tract infection, site not specified: Secondary | ICD-10-CM

## 2015-06-22 LAB — URINALYSIS, COMPLETE
BILIRUBIN UA: NEGATIVE
Glucose, UA: NEGATIVE
KETONES UA: NEGATIVE
Nitrite, UA: NEGATIVE
PH UA: 5.5 (ref 5.0–7.5)
Protein, UA: NEGATIVE
RBC UA: NEGATIVE
SPEC GRAV UA: 1.015 (ref 1.005–1.030)
UUROB: 0.2 mg/dL (ref 0.2–1.0)

## 2015-06-22 LAB — MICROSCOPIC EXAMINATION
MUCUS UA: NONE SEEN
RBC, UA: NONE SEEN /hpf (ref 0–?)

## 2015-06-22 NOTE — Addendum Note (Signed)
Addended by: Juluis Rainier on: 06/22/2015 10:48 AM   Modules accepted: Orders

## 2015-06-22 NOTE — Progress Notes (Signed)
In and Out Catheterization  Patient is present today for a I & O catheterization due to a possible UTI. Patient was cleaned and prepped in a sterile fashion with betadine and Lidocaine 2% jelly was instilled into the urethra.  A 14FR cath was inserted no complications were noted , 181ml of urine return was noted, urine was yellow in color. A clean urine sample was collected for u/a and cx. Bladder was drained  And catheter was removed with out difficulty.    Preformed by: Toniann Fail, LPN   Follow up/ Additional notes: Reinforced with pt to give [place] a call if she develops n/v, f/c. Pt voiced understanding.

## 2015-06-23 MED ORDER — METOPROLOL SUCCINATE ER 100 MG PO TB24: 100.0000 mg | ORAL_TABLET | Freq: Every day | ORAL | Status: DC

## 2015-06-23 NOTE — Telephone Encounter (Signed)
Patient notified by telephone that script has been sent in per her request.

## 2015-06-23 NOTE — Telephone Encounter (Signed)
Sent. Thanks.   

## 2015-06-23 NOTE — Telephone Encounter (Signed)
Spoke to pt who confirmed dosing at 100mg 

## 2015-06-27 LAB — CULTURE, URINE COMPREHENSIVE

## 2015-06-28 ENCOUNTER — Telehealth: Payer: Self-pay

## 2015-06-28 DIAGNOSIS — N390 Urinary tract infection, site not specified: Secondary | ICD-10-CM

## 2015-06-28 MED ORDER — AMOXICILLIN-POT CLAVULANATE 875-125 MG PO TABS: 1.0000 | ORAL_TABLET | Freq: Two times a day (BID) | ORAL | Status: AC

## 2015-06-28 NOTE — Telephone Encounter (Signed)
-----   Message from Nori Riis, PA-C sent at 06/27/2015  6:16 PM EDT ----- Patient has a +UCx.  They need to start Augmentin 875/125,  one tablet twice daily for seven days.   She has an appointment this week.

## 2015-06-28 NOTE — Telephone Encounter (Signed)
Spoke with pt in reference to +ucx. Made aware abx sent to pharmacy. Pt voiced understanding.

## 2015-06-29 ENCOUNTER — Ambulatory Visit (INDEPENDENT_AMBULATORY_CARE_PROVIDER_SITE_OTHER): Payer: PPO | Admitting: Urology

## 2015-06-29 ENCOUNTER — Encounter: Payer: Self-pay | Admitting: Urology

## 2015-06-29 VITALS — BP 145/86 | HR 69 | Ht 62.5 in | Wt 133.2 lb

## 2015-06-29 DIAGNOSIS — N3941 Urge incontinence: Secondary | ICD-10-CM | POA: Diagnosis not present

## 2015-06-29 LAB — MICROSCOPIC EXAMINATION
Epithelial Cells (non renal): >10 /hpf — AB (ref 0–10)
RBC, UA: NONE SEEN /hpf (ref 0–?)

## 2015-06-29 LAB — URINALYSIS, COMPLETE
BILIRUBIN UA: NEGATIVE
GLUCOSE, UA: NEGATIVE
Ketones, UA: NEGATIVE
Nitrite, UA: NEGATIVE
PROTEIN UA: NEGATIVE
Specific Gravity, UA: 1.015 (ref 1.005–1.030)
UUROB: 0.2 mg/dL (ref 0.2–1.0)
pH, UA: 5.5 (ref 5.0–7.5)

## 2015-06-29 LAB — BLADDER SCAN AMB NON-IMAGING: SCAN RESULT: 94

## 2015-06-29 NOTE — Progress Notes (Signed)
12:39 PM   Elizabeth Rivera 06-19-1950 [id number]  Referring provider: Tonia Ghent, MD [address] [place], [place] [address]  Chief Complaint  Patient presents with  . Follow-up    urinary incontinence, urinary retention    HPI: The patient is seen today for follow-up of recurrent urinary tract infections and urinary retention. The patient had a stroke approximately 2 months ago. She had worsening urinary incontinence, specifically urge incontinence. The patient presents today for follow-up of her urinary retention. She had her Foley catheter removed approximately 2 weeks ago. She has been voiding on her own at this point. She was started on antibiotics yesterday for an infection. However, her urinary incontinence is improving. She feels as if she is emptying her bladder more completely.  PMH: Past Medical History  Diagnosis Date  . Thyroid disease     hypothyroid  . Anxiety   . Hyperlipidemia   . Hypertension   . Other chest pain     chest discomfort  . Obesity   . Neuropathy ([place])   . Other esophagitis     erosive esophagitis  . Other specified gastritis without mention of hemorrhage     erosvie gastritis  . Pain in limb     Right leg pain  . Other specified disorders of adrenal glands 07/2007    adrenal mass via of CT 1.4cm left Adrenal no change(Dr.Cope)   . Other abnormal blood chemistry   . Diverticulosis of colon (without mention of hemorrhage) 05/20/2009    Internal hemms (Dr. Vira Agar)  . Irritable bowel syndrome     history of  . Insomnia, unspecified   . Nervous breakdown 08/2002    [facility]  . GERD (gastroesophageal reflux disease)     reflux HH 09/01/2002//egd reflux Esoph. HH Gastritis nonbleeding erosive gastropathy Duodentitis 08/15/2006  . Hyperglycemia   . Candidal vaginitis   . Forearm fracture     2013, left  . Anemia   . Anemia of other chronic disease 09/01/2013  . Diabetes mellitus without complication ([place])   . Stroke  ([place]) 03/2014  . Stroke [facility]) 03/2015    Surgical History: Past Surgical History  Procedure Laterality Date  . Cholecystectomy  03/2004  . Abdominal hysterectomy  1995    Part Hyst BSO DUB ovarian cyst B9  . Vaginal delivery  1977  . Breast reduction surgery  1984  . Cystoscopy  01/08/2008    Former site of inflammation healed (Dr. Jacqlyn Larsen)  . Nsvd x1  1977  . Esophagogastroduodenoscopy  1. 09/01/02  2. 08/15/06    1. Reflux, HH  2. Reflux esoph HH gaastritis nonbleeding erosive gastropathy duodenitis  . Colonoscopy  1. 08/15/06  2. 05/20/09    Int Hemms  2. Divertics Int Hemms (Dr. Vira Agar)  . Ct abd w & pelvis wo cm  6/09    CT Scan abd stable adrenal adenoma 1.4cm left adrenal no change (Dr. Jacqlyn Larsen)  . Breast cyst aspiration Bilateral   . Reduction mammaplasty  1985    Home Medications:    Medication List       This list is accurate as of: 06/29/15 12:39 PM.  Always use your most recent med list.               ALPRAZolam 0.25 MG tablet  Commonly known as:  XANAX  Take 1 tablet (0.25 mg total) by mouth 2 (two) times daily.     amLODipine 10 MG tablet  Commonly known  as:  NORVASC  Take 1 tablet (10 mg total) by mouth daily.     amoxicillin-clavulanate 875-125 MG tablet  Commonly known as:  AUGMENTIN  Take 1 tablet by mouth 2 (two) times daily.     atorvastatin 40 MG tablet  Commonly known as:  LIPITOR  Take 1 tablet (40 mg total) by mouth daily at 6 PM.     cholecalciferol 1000 units tablet  Commonly known as:  VITAMIN D  Take 1,000 Units by mouth daily.     cloNIDine 0.1 MG tablet  Commonly known as:  CATAPRES  Take by mouth.     clopidogrel 75 MG tablet  Commonly known as:  PLAVIX  Take 1 tablet (75 mg total) by mouth daily.     estradiol 0.1 MG/GM vaginal cream  Commonly known as:  ESTRACE  Place 1 Applicatorful vaginally at bedtime.     EXCEDRIN MIGRAINE 250-250-65 MG tablet  Generic drug:  aspirin-acetaminophen-caffeine  Take by mouth.     ferrous  sulfate 325 (65 FE) MG tablet  Commonly known as:  [employer] IRON  Take 1 tablet (325 mg total) by mouth daily with breakfast.     hydrochlorothiazide 25 MG tablet  Commonly known as:  HYDRODIURIL  Take 1 tablet (25 mg total) by mouth daily.     insulin glargine 100 UNIT/ML injection  Commonly known as:  LANTUS  Inject 9 Units into the skin at bedtime.     levothyroxine 125 MCG tablet  Commonly known as:  SYNTHROID, LEVOTHROID     LINZESS 145 MCG Caps capsule  Generic drug:  linaclotide  TK ONE C PO ONCE D     lisinopril 40 MG tablet  Commonly known as:  PRINIVIL,ZESTRIL  Take 1 tablet (40 mg total) by mouth daily.     Melatonin 5 MG Tabs  Take 5 mg by mouth at bedtime.     metFORMIN 1000 MG tablet  Commonly known as:  GLUCOPHAGE  Take 0.5 tablets (500 mg total) by mouth 2 (two) times daily with a meal.     metoprolol succinate 100 MG 24 hr tablet  Commonly known as:  TOPROL-XL  Take 1 tablet (100 mg total) by mouth daily. Take with or immediately following a meal.     pantoprazole 40 MG tablet  Commonly known as:  PROTONIX  Take 1 tablet (40 mg total) by mouth 2 (two) times daily.     pregabalin 75 MG capsule  Commonly known as:  LYRICA  Take 75 mg by mouth at bedtime. Reported on 06/29/2015     ranitidine 300 MG tablet  Commonly known as:  ZANTAC  Take 1 tablet (300 mg total) by mouth daily.     venlafaxine XR 150 MG 24 hr capsule  Commonly known as:  EFFEXOR-XR  Take 1 capsule (150 mg total) by mouth 2 (two) times daily.     vitamin B-12 1000 MCG tablet  Commonly known as:  CYANOCOBALAMIN  Take 1,000 mcg by mouth daily.        Allergies:  Allergies  Allergen Reactions  . Sulfa Antibiotics Rash    Family History: Family History  Problem Relation Age of Onset  . Hypertension Mother   . Hyperlipidemia Mother   . Stroke Mother     46  . Vision loss Father     vision problems  . Cancer Father     Colon   . Hyperlipidemia Sister   . Hypertension  Sister   . Vision  loss Sister   . Breast cancer Maternal Grandmother 20  . Breast cancer Cousin     paternal cousin  . Bladder Cancer Neg Hx   . Prostate cancer Paternal Grandfather   . Kidney cancer Neg Hx     Social History:  reports that she has never smoked. She has never used smokeless tobacco. She reports that she does not drink alcohol or use illicit drugs.  ROS: UROLOGY Frequent Urination?: No Hard to postpone urination?: No Burning/pain with urination?: Yes Get up at night to urinate?: Yes Leakage of urine?: Yes Urine stream starts and stops?: No Trouble starting stream?: Yes Do you have to strain to urinate?: No Blood in urine?: No Urinary tract infection?: Yes Sexually transmitted disease?: No Injury to kidneys or bladder?: No Painful intercourse?: No Weak stream?: No Currently pregnant?: No Vaginal bleeding?: No Last menstrual period?: n  Gastrointestinal Nausea?: No Vomiting?: No Indigestion/heartburn?: No Diarrhea?: No Constipation?: Yes  Constitutional Fever: No Night sweats?: No Weight loss?: No Fatigue?: Yes  Skin Skin rash/lesions?: No Itching?: No  Eyes Blurred vision?: No Double vision?: No  Ears/Nose/Throat Sore throat?: No Sinus problems?: No  Hematologic/Lymphatic Swollen glands?: No Easy bruising?: Yes  Cardiovascular Leg swelling?: Yes Chest pain?: No  Respiratory Cough?: No Shortness of breath?: No  Endocrine Excessive thirst?: No  Musculoskeletal Back pain?: No Joint pain?: No  Neurological Headaches?: No Dizziness?: No  Psychologic Depression?: Yes Anxiety?: Yes  Physical Exam: BP 145/86 mmHg  Pulse 69  Ht 5' 2.5" (1.588 m)  Wt 133 lb 3.2 oz (60.419 kg)  BMI 23.96 kg/m2  Constitutional:  Alert and oriented, No acute distress.   Laboratory Data: Lab Results  Component Value Date   WBC 5.1 04/02/2015   HGB 10.5* 04/02/2015   HCT 31.0* 04/02/2015   MCV 83.5 04/02/2015   PLT 171 04/02/2015     Lab Results  Component Value Date   CREATININE 0.82 04/08/2015    No results found for: PSA  No results found for: TESTOSTERONE  Lab Results  Component Value Date   HGBA1C 7.4* 03/12/2015    Urinalysis    Component Value Date/Time   COLORURINE YELLOW* 03/11/2015 1227   COLORURINE Straw 06/03/2014 1446   APPEARANCEUR Cloudy* 06/22/2015 1049   APPEARANCEUR CLEAR* 03/11/2015 1227   APPEARANCEUR Hazy 06/03/2014 1446   LABSPEC 1.010 03/11/2015 1227   LABSPEC 1.010 06/03/2014 1446   PHURINE 7.0 03/11/2015 1227   PHURINE 6.0 06/03/2014 1446   GLUCOSEU Negative 06/22/2015 1049   GLUCOSEU >=500 06/03/2014 1446   HGBUR NEGATIVE 03/11/2015 1227   HGBUR Negative 06/03/2014 1446   BILIRUBINUR Negative 06/22/2015 1049   BILIRUBINUR negative 05/04/2015 1430   BILIRUBINUR NEGATIVE 03/11/2015 1227   BILIRUBINUR Negative 06/03/2014 1446   KETONESUR TRACE* 03/11/2015 1227   KETONESUR Negative 06/03/2014 1446   PROTEINUR Negative 06/22/2015 1049   PROTEINUR negative 05/04/2015 1430   PROTEINUR >500* 03/11/2015 1227   PROTEINUR Negative 06/03/2014 1446   UROBILINOGEN 0.2 05/04/2015 1430   UROBILINOGEN 0.2 04/03/2008 1410   NITRITE Negative 06/22/2015 1049   NITRITE negative 05/04/2015 1430   NITRITE NEGATIVE 03/11/2015 1227   NITRITE Negative 06/03/2014 1446   LEUKOCYTESUR Trace* 06/22/2015 1049   LEUKOCYTESUR moderate (2+)* 05/04/2015 1430   LEUKOCYTESUR 1+ 06/03/2014 1446    Pertinent Imaging: PVR: 94 mL's Assessment & Plan:  The patient has incomplete bladder emptying and urinary urge incontinence. The patient is voiding on her out, as she is imaging her bladder more completely. Her urinary  tract symptoms are improving. She is currently being treated for urinary tract infection P to encourage her to continue with cranberry tablets and probiotic. We plan to follow up with the patient 6 months. If she has issues prior to that we will be happy to see her sooner.  There are no  diagnoses linked to this encounter.  Return in about 6 months (around 12/30/2015).  Ardis Hughs, [facility] [address] [place], [place] [address] [phone]

## 2015-07-16 DIAGNOSIS — M47816 Spondylosis without myelopathy or radiculopathy, lumbar region: Secondary | ICD-10-CM | POA: Insufficient documentation

## 2015-08-25 ENCOUNTER — Other Ambulatory Visit: Payer: PPO

## 2015-09-02 ENCOUNTER — Encounter: Payer: Self-pay | Admitting: Urology

## 2015-09-02 ENCOUNTER — Ambulatory Visit (INDEPENDENT_AMBULATORY_CARE_PROVIDER_SITE_OTHER): Payer: PPO | Admitting: Urology

## 2015-09-02 VITALS — BP 158/83 | HR 60 | Ht 62.5 in | Wt 134.5 lb

## 2015-09-02 DIAGNOSIS — N3941 Urge incontinence: Secondary | ICD-10-CM | POA: Diagnosis not present

## 2015-09-02 DIAGNOSIS — Z8673 Personal history of transient ischemic attack (TIA), and cerebral infarction without residual deficits: Secondary | ICD-10-CM | POA: Diagnosis not present

## 2015-09-02 DIAGNOSIS — N390 Urinary tract infection, site not specified: Secondary | ICD-10-CM

## 2015-09-02 DIAGNOSIS — Z87448 Personal history of other diseases of urinary system: Secondary | ICD-10-CM | POA: Diagnosis not present

## 2015-09-02 DIAGNOSIS — Z87898 Personal history of other specified conditions: Secondary | ICD-10-CM

## 2015-09-02 LAB — URINALYSIS, COMPLETE
Bilirubin, UA: NEGATIVE
Glucose, UA: NEGATIVE
Ketones, UA: NEGATIVE
Nitrite, UA: POSITIVE — AB
PH UA: 5.5 (ref 5.0–7.5)
PROTEIN UA: NEGATIVE
Specific Gravity, UA: 1.02 (ref 1.005–1.030)
Urobilinogen, Ur: 0.2 mg/dL (ref 0.2–1.0)

## 2015-09-02 LAB — MICROSCOPIC EXAMINATION
RBC MICROSCOPIC, UA: NONE SEEN /HPF (ref 0–?)
WBC, UA: >30 /hpf — ABNORMAL HIGH (ref 0–?)

## 2015-09-02 LAB — BLADDER SCAN AMB NON-IMAGING: SCAN RESULT: 0

## 2015-09-02 MED ORDER — MIRABEGRON ER 25 MG PO TB24: 25.0000 mg | ORAL_TABLET | Freq: Every day | ORAL | 11 refills | Status: AC

## 2015-09-02 NOTE — Progress Notes (Signed)
09/02/2015 11:16 AM   Elizabeth Rivera [birth year] [id number]  Referring provider: Kirk Ruths, MD [place] [facility] [place], [place] [address]  Chief Complaint  Patient presents with  . Follow-up    urinary urgency    HPI: The patient is a 65 year old female with a history of stroke in February 2017, urinary retention and UTI who presents today for urinary frequency and urgency. She presents today with worsening of her urge and urge incontinence. She denies any leakage with coughing or sneezing. She has such severe urgency with incontinence that she has to wear depends. She often will still clear depends at night. She finds this very bothersome. She was recently treated for urinary tract infection. She has no dysuria or symptoms of a urinary tract infection at this time. She does have a history of urinary retention of approximately 200-250 cc. Her PVR today however 0.   PMH: Past Medical History:  Diagnosis Date  . Anemia   . Anemia of other chronic disease 09/01/2013  . Anxiety   . Candidal vaginitis   . Diabetes mellitus without complication ([place])   . Diverticulosis of colon (without mention of hemorrhage) 05/20/2009   Internal hemms (Dr. Vira Agar)  . Forearm fracture    2013, left  . GERD (gastroesophageal reflux disease)    reflux HH 09/01/2002//egd reflux Esoph. HH Gastritis nonbleeding erosive gastropathy Duodentitis 08/15/2006  . Hyperglycemia   . Hyperlipidemia   . Hypertension   . Insomnia, unspecified   . Irritable bowel syndrome    history of  . Nervous breakdown 08/2002   [facility]  . Neuropathy ([place])   . Obesity   . Other abnormal blood chemistry   . Other chest pain    chest discomfort  . Other esophagitis    erosive esophagitis  . Other specified disorders of adrenal glands 07/2007   adrenal mass via of CT 1.4cm left Adrenal no change(Dr.Cope)   . Other specified gastritis without mention of hemorrhage    erosvie  gastritis  . Pain in limb    Right leg pain  . Stroke ([place]) 03/2014  . Stroke ([place]) 03/2015  . Thyroid disease    hypothyroid    Surgical History: Past Surgical History:  Procedure Laterality Date  . ABDOMINAL HYSTERECTOMY  1995   Part Hyst BSO DUB ovarian cyst B9  . BREAST CYST ASPIRATION Bilateral   . BREAST REDUCTION SURGERY  1984  . CHOLECYSTECTOMY  03/2004  . COLONOSCOPY  1. 08/15/06  2. 05/20/09   Int Hemms  2. Divertics Int Hemms (Dr. Vira Agar)  . CT ABD W & PELVIS WO CM  6/09   CT Scan abd stable adrenal adenoma 1.4cm left adrenal no change (Dr. Jacqlyn Larsen)  . CYSTOSCOPY  01/08/2008   Former site of inflammation healed (Dr. Jacqlyn Larsen)  . ESOPHAGOGASTRODUODENOSCOPY  1. 09/01/02  2. 08/15/06   1. Reflux, HH  2. Reflux esoph HH gaastritis nonbleeding erosive gastropathy duodenitis  . NSVD x1  1977  . REDUCTION MAMMAPLASTY  1985  . VAGINAL DELIVERY  1977    Home Medications:    Medication List       Accurate as of 09/02/15 11:16 AM. Always use your most recent med list.          ALPRAZolam 0.25 MG tablet Commonly known as:  XANAX Take 1 tablet (0.25 mg total) by mouth 2 (two) times daily.   amLODipine 10 MG tablet Commonly known as:  NORVASC Take 1  tablet (10 mg total) by mouth daily.   atorvastatin 40 MG tablet Commonly known as:  LIPITOR Take 1 tablet (40 mg total) by mouth daily at 6 PM.   cholecalciferol 1000 units tablet Commonly known as:  VITAMIN D Take 1,000 Units by mouth daily.   cloNIDine 0.1 MG tablet Commonly known as:  CATAPRES Take by mouth.   clopidogrel 75 MG tablet Commonly known as:  PLAVIX Take 1 tablet (75 mg total) by mouth daily.   estradiol 0.1 MG/GM vaginal cream Commonly known as:  ESTRACE Place 1 Applicatorful vaginally at bedtime.   EXCEDRIN MIGRAINE 250-250-65 MG tablet Generic drug:  aspirin-acetaminophen-caffeine Take by mouth.   ferrous sulfate 325 (65 FE) MG tablet Commonly known as:  [employer]S IRON Take 1 tablet (325 mg total) by  mouth daily with breakfast.   gabapentin 100 MG capsule Commonly known as:  NEURONTIN Take 100 mg by mouth 3 (three) times daily.   hydrochlorothiazide 25 MG tablet Commonly known as:  HYDRODIURIL Take 1 tablet (25 mg total) by mouth daily.   insulin glargine 100 UNIT/ML injection Commonly known as:  LANTUS Inject 9 Units into the skin at bedtime.   levothyroxine 125 MCG tablet Commonly known as:  SYNTHROID, LEVOTHROID   LINZESS 145 MCG Caps capsule Generic drug:  linaclotide TK ONE C PO ONCE D   lisinopril 40 MG tablet Commonly known as:  PRINIVIL,ZESTRIL Take 1 tablet (40 mg total) by mouth daily.   Melatonin 5 MG Tabs Take 5 mg by mouth at bedtime.   metFORMIN 1000 MG tablet Commonly known as:  GLUCOPHAGE Take 0.5 tablets (500 mg total) by mouth 2 (two) times daily with a meal.   metoprolol succinate 100 MG 24 hr tablet Commonly known as:  TOPROL-XL Take 1 tablet (100 mg total) by mouth daily. Take with or immediately following a meal.   mirabegron ER 25 MG Tb24 tablet Commonly known as:  MYRBETRIQ Take 1 tablet (25 mg total) by mouth daily.   pantoprazole 40 MG tablet Commonly known as:  PROTONIX Take 1 tablet (40 mg total) by mouth 2 (two) times daily.   pregabalin 75 MG capsule Commonly known as:  LYRICA Take 75 mg by mouth at bedtime. Reported on 06/29/2015   ranitidine 300 MG tablet Commonly known as:  ZANTAC Take 1 tablet (300 mg total) by mouth daily.   venlafaxine XR 150 MG 24 hr capsule Commonly known as:  EFFEXOR-XR Take 1 capsule (150 mg total) by mouth 2 (two) times daily.   vitamin B-12 1000 MCG tablet Commonly known as:  CYANOCOBALAMIN Take 1,000 mcg by mouth daily.       Allergies:  Allergies  Allergen Reactions  . Sulfa Antibiotics Rash    Family History: Family History  Problem Relation Age of Onset  . Hypertension Mother   . Hyperlipidemia Mother   . Stroke Mother     17  . Vision loss Father     vision problems  .  Cancer Father     Colon   . Hyperlipidemia Sister   . Hypertension Sister   . Vision loss Sister   . Breast cancer Maternal Grandmother 6  . Breast cancer Cousin     paternal cousin  . Prostate cancer Paternal Grandfather   . Bladder Cancer Neg Hx   . Kidney cancer Neg Hx     Social History:  reports that she has never smoked. She has never used smokeless tobacco. She reports that she does not drink  alcohol or use drugs.  ROS: UROLOGY Frequent Urination?: Yes Hard to postpone urination?: Yes Burning/pain with urination?: Yes Get up at night to urinate?: Yes Leakage of urine?: Yes Urine stream starts and stops?: Yes Trouble starting stream?: Yes Do you have to strain to urinate?: Yes Blood in urine?: Yes Urinary tract infection?: Yes Sexually transmitted disease?: No Injury to kidneys or bladder?: No Painful intercourse?: No Weak stream?: Yes Currently pregnant?: No Vaginal bleeding?: No Last menstrual period?: n  Gastrointestinal Nausea?: No Vomiting?: No Indigestion/heartburn?: No Diarrhea?: Yes Constipation?: Yes  Constitutional Fever: No Night sweats?: No Weight loss?: No Fatigue?: Yes  Skin Skin rash/lesions?: No Itching?: No  Eyes Blurred vision?: No Double vision?: No  Ears/Nose/Throat Sore throat?: No Sinus problems?: No  Hematologic/Lymphatic Swollen glands?: No Easy bruising?: Yes  Cardiovascular Leg swelling?: Yes Chest pain?: No  Respiratory Cough?: No Shortness of breath?: No  Endocrine Excessive thirst?: No  Musculoskeletal Back pain?: No Joint pain?: Yes  Neurological Headaches?: Yes Dizziness?: Yes  Psychologic Depression?: Yes Anxiety?: Yes  Physical Exam: BP (!) 158/83 (BP Location: Left Arm, Patient Position: Sitting, Cuff Size: Normal)   Pulse 60   Ht 5' 2.5" (1.588 m)   Wt 134 lb 8 oz (61 kg)   BMI 24.21 kg/m   Constitutional:  Alert and oriented, No acute distress. HEENT: [place] AT, moist mucus membranes.   Trachea midline, no masses. Cardiovascular: No clubbing, cyanosis, or edema. Respiratory: Normal respiratory effort, no increased work of breathing. GI: Abdomen is soft, nontender, nondistended, no abdominal masses GU: No CVA tenderness.  Skin: No rashes, bruises or suspicious lesions. Lymph: No cervical or inguinal adenopathy. Neurologic: Grossly intact, no focal deficits, moving all 4 extremities. Psychiatric: Normal mood and affect.  Laboratory Data: Lab Results  Component Value Date   WBC 5.1 04/02/2015   HGB 10.5 (L) 04/02/2015   HCT 31.0 (L) 04/02/2015   MCV 83.5 04/02/2015   PLT 171 04/02/2015    Lab Results  Component Value Date   CREATININE 0.82 04/08/2015    No results found for: PSA  No results found for: TESTOSTERONE  Lab Results  Component Value Date   HGBA1C 7.4 (H) 03/12/2015    Urinalysis    Component Value Date/Time   COLORURINE YELLOW (A) 03/11/2015 1227   APPEARANCEUR Cloudy (A) 06/29/2015 1214   LABSPEC 1.010 03/11/2015 1227   LABSPEC 1.010 06/03/2014 1446   PHURINE 7.0 03/11/2015 1227   GLUCOSEU Negative 06/29/2015 1214   GLUCOSEU >=500 06/03/2014 1446   HGBUR NEGATIVE 03/11/2015 1227   BILIRUBINUR Negative 06/29/2015 1214   BILIRUBINUR Negative 06/03/2014 1446   KETONESUR TRACE (A) 03/11/2015 1227   PROTEINUR Negative 06/29/2015 1214   PROTEINUR >500 (A) 03/11/2015 1227   UROBILINOGEN 0.2 05/04/2015 1430   UROBILINOGEN 0.2 04/03/2008 1410   NITRITE Negative 06/29/2015 1214   NITRITE NEGATIVE 03/11/2015 1227   LEUKOCYTESUR 2+ (A) 06/29/2015 1214   LEUKOCYTESUR 1+ 06/03/2014 1446     Assessment & Plan:    1. Urinary urgency with incontinence 2. History of stroke 3. History of urinary retention 4. History of UTI The patient has worsening of her urinary urgency with incontinence. We will start her on Myrbetriq 25 mg daily. She was made aware that she has an increased risk of urinary retention on this medication with her history. I  feel it is safe to start her on this medication today though because her PVR is 0. I do not think she is a candidate for anticholinergics due  to her baseline history of stroke. She'll follow-up in one month to keep a close eye on her post-for residual. Prescription and samples were given of this medication  Return in about 4 weeks (around 09/30/2015).  Nickie Retort, MD  [facility] [address] [place], [place] [address] [phone]

## 2015-09-04 LAB — URINE CULTURE

## 2015-09-07 ENCOUNTER — Telehealth: Payer: Self-pay | Admitting: Urology

## 2015-09-07 NOTE — Telephone Encounter (Signed)
I did the PA for the patient's myrbetriq and they denied it saying that she needed to try and fail two other medications. They reviewed vesicare and it was way to expensive and then they looked at oxibutin and it only cost $4.00 so they suggested we give her that. So can you check with dr. Pilar Jarvis and see what he wants to do? I faxed the denial to [place].   Thanks,  Sharyn Lull

## 2015-09-08 NOTE — Telephone Encounter (Signed)
The patient is not eligible for anticholinergics due to her stroke history and baseline mental status. Ditropan, vesicare, etc are contraindicated in her. The insurance company needs to made aware of this on the prior authorization. This is documented in my note from our office visit if they need a copy.

## 2015-09-08 NOTE — Telephone Encounter (Signed)
Please review the patient urine culture results. It came back positive for E. Coli.

## 2015-09-08 NOTE — Telephone Encounter (Signed)
Sharyn Lull called patient to inform her to come by office to pick up Myrbetriq 25mg  samples until we are able to get Prior Authorization for the medication.

## 2015-09-09 ENCOUNTER — Telehealth: Payer: Self-pay

## 2015-09-09 DIAGNOSIS — N390 Urinary tract infection, site not specified: Secondary | ICD-10-CM

## 2015-09-09 MED ORDER — NITROFURANTOIN MONOHYD MACRO 100 MG PO CAPS: 100.0000 mg | ORAL_CAPSULE | Freq: Two times a day (BID) | ORAL | 0 refills | Status: AC

## 2015-09-09 NOTE — Telephone Encounter (Signed)
-----   Message from Nickie Retort, MD sent at 09/09/2015  8:48 AM EDT ----- Can we start patient on macrobid 100 mg BID for three days? thanks

## 2015-09-09 NOTE — Telephone Encounter (Signed)
LMOM-medication sent to pharmacy 

## 2015-09-10 NOTE — Telephone Encounter (Signed)
Spoke with pt in reference to abx. Pt stated she picked up medication last night and has started it.

## 2015-09-24 ENCOUNTER — Telehealth: Payer: Self-pay

## 2015-09-24 NOTE — Telephone Encounter (Signed)
PA for myrbetriq 25mg  has been APPROVED for 09/23/15-02/06/16.

## 2015-10-07 ENCOUNTER — Ambulatory Visit (INDEPENDENT_AMBULATORY_CARE_PROVIDER_SITE_OTHER): Payer: PPO | Admitting: Urology

## 2015-10-07 ENCOUNTER — Encounter: Payer: Self-pay | Admitting: Urology

## 2015-10-07 VITALS — BP 120/71 | HR 64 | Ht 63.0 in | Wt 134.9 lb

## 2015-10-07 DIAGNOSIS — N3941 Urge incontinence: Secondary | ICD-10-CM

## 2015-10-07 LAB — BLADDER SCAN AMB NON-IMAGING: Scan Result: 81

## 2015-10-07 NOTE — Progress Notes (Signed)
10/07/2015 11:22 AM   [name] [birth year] OH:[id number]  Referring provider: Kirk Ruths, MD [facility] [place], [place] [address]  Chief Complaint  Patient presents with  . Urinary Incontinence    urge incontinence    HPI: The patient is a 65 year old female with a history of stroke in February 2017, urinary retention and UTI who presents today for urinary frequency and urgency. She presents today with worsening of her urge and urge incontinence. She denies any leakage with coughing or sneezing. She has such severe urgency with incontinence that she has to wear depends. She often will still clear depends at night. She finds this very bothersome. She was recently treated for urinary tract infection. She has no dysuria or symptoms of a urinary tract infection at this time. She does have a history of urinary retention of approximately 200-250 cc. Her PVR was however 0. So she was started on Myrbetriq. She had a history of stroke x2 so we did not want to start her on an anticholinergic due to her baseline mental status.  She is very happy with the Myrbetriq. Her symptoms have completely resolved. She has no urgency or incontinence. Her PVR is 80 cc. She is having difficulty affording her medication.    PMH: Past Medical History:  Diagnosis Date  . Anemia   . Anemia of other chronic disease 09/01/2013  . Anxiety   . Candidal vaginitis   . Diabetes mellitus without complication ([place])   . Diverticulosis of colon (without mention of hemorrhage) 05/20/2009   Internal hemms (Dr. Vira Agar)  . Forearm fracture    2013, left  . GERD (gastroesophageal reflux disease)    reflux HH 09/01/2002//egd reflux Esoph. HH Gastritis nonbleeding erosive gastropathy Duodentitis 08/15/2006  . Hyperglycemia   . Hyperlipidemia   . Hypertension   . Insomnia, unspecified   . Irritable bowel syndrome    history of  . Nervous breakdown 08/2002   [facility]  .  Neuropathy ([place])   . Obesity   . Other abnormal blood chemistry   . Other chest pain    chest discomfort  . Other esophagitis    erosive esophagitis  . Other specified disorders of adrenal glands 07/2007   adrenal mass via of CT 1.4cm left Adrenal no change(Dr.Cope)   . Other specified gastritis without mention of hemorrhage    erosvie gastritis  . Pain in limb    Right leg pain  . Stroke ([place]) 03/2014  . Stroke ([place]) 03/2015  . Thyroid disease    hypothyroid    Surgical History: Past Surgical History:  Procedure Laterality Date  . ABDOMINAL HYSTERECTOMY  1995   Part Hyst BSO DUB ovarian cyst B9  . BREAST CYST ASPIRATION Bilateral   . BREAST REDUCTION SURGERY  1984  . CHOLECYSTECTOMY  03/2004  . COLONOSCOPY  1. 08/15/06  2. 05/20/09   Int Hemms  2. Divertics Int Hemms (Dr. Vira Agar)  . CT ABD W & PELVIS WO CM  6/09   CT Scan abd stable adrenal adenoma 1.4cm left adrenal no change (Dr. Jacqlyn Larsen)  . CYSTOSCOPY  01/08/2008   Former site of inflammation healed (Dr. Jacqlyn Larsen)  . ESOPHAGOGASTRODUODENOSCOPY  1. 09/01/02  2. 08/15/06   1. Reflux, HH  2. Reflux esoph HH gaastritis nonbleeding erosive gastropathy duodenitis  . NSVD x1  1977  . REDUCTION MAMMAPLASTY  1985  . VAGINAL DELIVERY  1977    Home Medications:    Medication  List       Accurate as of 10/07/15 11:22 AM. Always use your most recent med list.          ALPRAZolam 0.25 MG tablet Commonly known as:  XANAX Take 1 tablet (0.25 mg total) by mouth 2 (two) times daily.   amLODipine 10 MG tablet Commonly known as:  NORVASC Take 1 tablet (10 mg total) by mouth daily.   atorvastatin 40 MG tablet Commonly known as:  LIPITOR Take 1 tablet (40 mg total) by mouth daily at 6 PM.   cholecalciferol 1000 units tablet Commonly known as:  VITAMIN D Take 1,000 Units by mouth daily.   cloNIDine 0.1 MG tablet Commonly known as:  CATAPRES Take by mouth.   clopidogrel 75 MG tablet Commonly known as:  PLAVIX Take 1 tablet (75 mg  total) by mouth daily.   estradiol 0.1 MG/GM vaginal cream Commonly known as:  ESTRACE Place 1 Applicatorful vaginally at bedtime.   EXCEDRIN MIGRAINE 250-250-65 MG tablet Generic drug:  aspirin-acetaminophen-caffeine Take by mouth.   ferrous sulfate 325 (65 FE) MG tablet Commonly known as:  [employer] IRON Take 1 tablet (325 mg total) by mouth daily with breakfast.   gabapentin 100 MG capsule Commonly known as:  NEURONTIN Take 100 mg by mouth 3 (three) times daily.   hydrochlorothiazide 25 MG tablet Commonly known as:  HYDRODIURIL Take 1 tablet (25 mg total) by mouth daily.   insulin glargine 100 UNIT/ML injection Commonly known as:  LANTUS Inject 9 Units into the skin at bedtime.   levothyroxine 125 MCG tablet Commonly known as:  SYNTHROID, LEVOTHROID   lisinopril 40 MG tablet Commonly known as:  PRINIVIL,ZESTRIL Take 1 tablet (40 mg total) by mouth daily.   Melatonin 5 MG Tabs Take 5 mg by mouth at bedtime.   metFORMIN 1000 MG tablet Commonly known as:  GLUCOPHAGE Take 0.5 tablets (500 mg total) by mouth 2 (two) times daily with a meal.   metoprolol succinate 100 MG 24 hr tablet Commonly known as:  TOPROL-XL Take 1 tablet (100 mg total) by mouth daily. Take with or immediately following a meal.   mirabegron ER 25 MG Tb24 tablet Commonly known as:  MYRBETRIQ Take 1 tablet (25 mg total) by mouth daily.   pantoprazole 40 MG tablet Commonly known as:  PROTONIX Take 1 tablet (40 mg total) by mouth 2 (two) times daily.   pregabalin 75 MG capsule Commonly known as:  LYRICA Take 75 mg by mouth at bedtime. Reported on 06/29/2015   ranitidine 300 MG tablet Commonly known as:  ZANTAC Take 1 tablet (300 mg total) by mouth daily.   venlafaxine XR 150 MG 24 hr capsule Commonly known as:  EFFEXOR-XR Take 1 capsule (150 mg total) by mouth 2 (two) times daily.   vitamin B-12 1000 MCG tablet Commonly known as:  CYANOCOBALAMIN Take 1,000 mcg by mouth daily.        Allergies:  Allergies  Allergen Reactions  . Sulfa Antibiotics Rash    Family History: Family History  Problem Relation Age of Onset  . Hypertension Mother   . Hyperlipidemia Mother   . Stroke Mother     45  . Vision loss Father     vision problems  . Cancer Father     Colon   . Hyperlipidemia Sister   . Hypertension Sister   . Vision loss Sister   . Breast cancer Maternal Grandmother 37  . Breast cancer Cousin     paternal cousin  .  Prostate cancer Paternal Grandfather   . Bladder Cancer Neg Hx   . Kidney cancer Neg Hx     Social History:  reports that she has never smoked. She has never used smokeless tobacco. She reports that she does not drink alcohol or use drugs.  ROS:                                        Physical Exam: BP 120/71   Pulse 64   Ht 5\' 3"  (1.6 m)   Wt 134 lb 14.4 oz (61.2 kg)   BMI 23.90 kg/m   Constitutional:  Alert and oriented, No acute distress. HEENT: [place] AT, moist mucus membranes.  Trachea midline, no masses. Cardiovascular: No clubbing, cyanosis, or edema. Respiratory: Normal respiratory effort, no increased work of breathing. GI: Abdomen is soft, nontender, nondistended, no abdominal masses GU: No CVA tenderness.  Skin: No rashes, bruises or suspicious lesions. Lymph: No cervical or inguinal adenopathy. Neurologic: Grossly intact, no focal deficits, moving all 4 extremities. Psychiatric: Normal mood and affect.  Laboratory Data: Lab Results  Component Value Date   WBC 5.1 04/02/2015   HGB 10.5 (L) 04/02/2015   HCT 31.0 (L) 04/02/2015   MCV 83.5 04/02/2015   PLT 171 04/02/2015    Lab Results  Component Value Date   CREATININE 0.82 04/08/2015    No results found for: PSA  No results found for: TESTOSTERONE  Lab Results  Component Value Date   HGBA1C 7.4 (H) 03/12/2015    Urinalysis    Component Value Date/Time   COLORURINE YELLOW (A) 03/11/2015 1227   APPEARANCEUR Cloudy (A)  09/02/2015 1052   LABSPEC 1.010 03/11/2015 1227   LABSPEC 1.010 06/03/2014 1446   PHURINE 7.0 03/11/2015 1227   GLUCOSEU Negative 09/02/2015 1052   GLUCOSEU >=500 06/03/2014 1446   HGBUR NEGATIVE 03/11/2015 1227   BILIRUBINUR Negative 09/02/2015 1052   BILIRUBINUR Negative 06/03/2014 1446   KETONESUR TRACE (A) 03/11/2015 1227   PROTEINUR Negative 09/02/2015 1052   PROTEINUR >500 (A) 03/11/2015 1227   UROBILINOGEN 0.2 05/04/2015 1430   UROBILINOGEN 0.2 04/03/2008 1410   NITRITE Positive (A) 09/02/2015 1052   NITRITE NEGATIVE 03/11/2015 1227   LEUKOCYTESUR 2+ (A) 09/02/2015 1052   LEUKOCYTESUR 1+ 06/03/2014 1446     Assessment & Plan:    1. Urinary urgency with incontinence 2. History of stroke 3. History of urinary retention 4. History of UTI -continue Myrbetriq 25 mg daily. I discussed her difficulty affording the medication with the Myrberiq rep. He has offered to provide her with charity samples. The patient will call the office when she runs out of medication and needs more samples. -follow up in one year   Return in about 1 year (around 10/06/2016).  Nickie Retort, MD  [facility] [address] [place], [place] [address] [phone]

## 2015-10-11 ENCOUNTER — Ambulatory Visit: Payer: PPO | Admitting: Gastroenterology

## 2015-10-12 ENCOUNTER — Ambulatory Visit (INDEPENDENT_AMBULATORY_CARE_PROVIDER_SITE_OTHER): Payer: PPO | Admitting: Gastroenterology

## 2015-10-12 ENCOUNTER — Encounter: Payer: Self-pay | Admitting: Gastroenterology

## 2015-10-12 VITALS — BP 170/90 | HR 66 | Temp 98.1°F | Ht 62.0 in | Wt 132.0 lb

## 2015-10-12 DIAGNOSIS — K5900 Constipation, unspecified: Secondary | ICD-10-CM | POA: Diagnosis not present

## 2015-10-12 NOTE — Progress Notes (Signed)
Gastroenterology Consultation  Referring Provider:     Kirk Ruths, MD Primary Care Physician:  Kirk Ruths., MD Primary Gastroenterologist:  Dr. Allen Norris     Reason for Consultation:     Constipation        HPI:   Elizabeth Rivera is a 65 y.o. y/o female referred for consultation & management of Constipation by Dr. Kirk Ruths., MD.  This patient comes today after seeing Dr. Vira Agar for the last 20 years.  The patient reports that she has had a history of constipation that has gone much worse since her second stroke.  The patient had a colonoscopy in 2015 in the patient states that everything was fine at that time.  She now reports that she has tried multiple medications including Linzess, Amitiza,, Colace, MiraLAX and Citrucel.  The only thing that she has found to work is taking Dulcolax suppositories and pills every 3 days.  She reports that when she does this she has soft bowel movements. The patient also reports that she is unable to initiate a bowel movement without manual stimulation with her finger.  She then states that because her stools are so  Soft in consistency like peanut butter that it is hard to move her bowels completely and she will spend multiple hours on the toilet.   There is no report of any black stools or bloody stools.  She also denies any unexplained weight loss.  Past Medical History:  Diagnosis Date  . Anemia   . Anemia of other chronic disease 09/01/2013  . Anxiety   . Candidal vaginitis   . Diabetes mellitus without complication ([place])   . Diverticulosis of colon (without mention of hemorrhage) 05/20/2009   Internal hemms (Dr. Vira Agar)  . Forearm fracture    2013, left  . GERD (gastroesophageal reflux disease)    reflux HH 09/01/2002//egd reflux Esoph. HH Gastritis nonbleeding erosive gastropathy Duodentitis 08/15/2006  . Hyperglycemia   . Hyperlipidemia   . Hypertension   . Insomnia, unspecified   . Irritable bowel syndrome    history of  . Nervous breakdown 08/2002   [facility]  . Neuropathy ([place])   . Obesity   . Other abnormal blood chemistry   . Other chest pain    chest discomfort  . Other esophagitis    erosive esophagitis  . Other specified disorders of adrenal glands 07/2007   adrenal mass via of CT 1.4cm left Adrenal no change(Dr.Cope)   . Other specified gastritis without mention of hemorrhage    erosvie gastritis  . Pain in limb    Right leg pain  . Stroke ([place]) 03/2014  . Stroke ([place]) 03/2015  . Thyroid disease    hypothyroid    Past Surgical History:  Procedure Laterality Date  . ABDOMINAL HYSTERECTOMY  1995   Part Hyst BSO DUB ovarian cyst B9  . BREAST CYST ASPIRATION Bilateral   . BREAST REDUCTION SURGERY  1984  . CHOLECYSTECTOMY  03/2004  . COLONOSCOPY  1. 08/15/06  2. 05/20/09   Int Hemms  2. Divertics Int Hemms (Dr. Vira Agar)  . CT ABD W & PELVIS WO CM  6/09   CT Scan abd stable adrenal adenoma 1.4cm left adrenal no change (Dr. Jacqlyn Larsen)  . CYSTOSCOPY  01/08/2008   Former site of inflammation healed (Dr. Jacqlyn Larsen)  . ESOPHAGOGASTRODUODENOSCOPY  1. 09/01/02  2. 08/15/06   1. Reflux, HH  2. Reflux esoph HH gaastritis nonbleeding erosive gastropathy duodenitis  . NSVD x1  1977  .  REDUCTION MAMMAPLASTY  1985  . VAGINAL DELIVERY  1977    Prior to Admission medications   Medication Sig Start Date End Date Taking? Authorizing Provider  ALPRAZolam (XANAX) 0.25 MG tablet Take 1 tablet (0.25 mg total) by mouth 2 (two) times daily. 03/15/15   Bettey Costa, MD  atorvastatin (LIPITOR) 40 MG tablet Take 1 tablet (40 mg total) by mouth daily at 6 PM. 04/21/15   Tonia Ghent, MD  cholecalciferol (VITAMIN D) 1000 units tablet Take 1,000 Units by mouth daily.    Historical Provider, MD  cloNIDine (CATAPRES) 0.1 MG tablet Take by mouth. 04/12/15 04/11/16  Historical Provider, MD  clopidogrel (PLAVIX) 75 MG tablet Take 1 tablet (75 mg total) by mouth daily. 04/22/15   Tonia Ghent, MD  estradiol (ESTRACE) 0.1  MG/GM vaginal cream Place 1 Applicatorful vaginally at bedtime. 06/17/15   Nori Riis, PA-C  ferrous sulfate (CVS IRON) 325 (65 FE) MG tablet Take 1 tablet (325 mg total) by mouth daily with breakfast. 04/08/15   Tonia Ghent, MD  gabapentin (NEURONTIN) 100 MG capsule Take 100 mg by mouth 3 (three) times daily.    Historical Provider, MD  hydrochlorothiazide (HYDRODIURIL) 25 MG tablet Take 1 tablet (25 mg total) by mouth daily. 04/22/15   Tonia Ghent, MD  insulin glargine (LANTUS) 100 UNIT/ML injection Inject 9 Units into the skin at bedtime.    Historical Provider, MD  levothyroxine (SYNTHROID, LEVOTHROID) 125 MCG tablet  05/27/15   Historical Provider, MD  lisinopril (PRINIVIL,ZESTRIL) 40 MG tablet Take 1 tablet (40 mg total) by mouth daily. Patient taking differently: Take 2.5 mg by mouth daily.  04/27/15   Tonia Ghent, MD  Melatonin 5 MG TABS Take 5 mg by mouth at bedtime.    Historical Provider, MD  metFORMIN (GLUCOPHAGE) 1000 MG tablet Take 0.5 tablets (500 mg total) by mouth 2 (two) times daily with a meal. 02/09/15   Tonia Ghent, MD  metoprolol succinate (TOPROL-XL) 100 MG 24 hr tablet Take 1 tablet (100 mg total) by mouth daily. Take with or immediately following a meal. 06/23/15   Tonia Ghent, MD  mirabegron ER (MYRBETRIQ) 25 MG TB24 tablet Take 1 tablet (25 mg total) by mouth daily. 09/02/15   Nickie Retort, MD  pantoprazole (PROTONIX) 40 MG tablet Take 1 tablet (40 mg total) by mouth 2 (two) times daily. 04/25/15   Tonia Ghent, MD  ranitidine (ZANTAC) 300 MG tablet Take 1 tablet (300 mg total) by mouth daily. 01/08/15   Tonia Ghent, MD  venlafaxine XR (EFFEXOR-XR) 150 MG 24 hr capsule Take 1 capsule (150 mg total) by mouth 2 (two) times daily. 01/08/15   Tonia Ghent, MD  vitamin B-12 (CYANOCOBALAMIN) 1000 MCG tablet Take 1,000 mcg by mouth daily.     Historical Provider, MD    Family History  Problem Relation Age of Onset  . Hypertension Mother   .  Hyperlipidemia Mother   . Stroke Mother     75  . Vision loss Father     vision problems  . Cancer Father     Colon   . Hyperlipidemia Sister   . Hypertension Sister   . Vision loss Sister   . Breast cancer Maternal Grandmother 70  . Breast cancer Cousin     paternal cousin  . Prostate cancer Paternal Grandfather   . Bladder Cancer Neg Hx   . Kidney cancer Neg Hx  Social History  Substance Use Topics  . Smoking status: Never Smoker  . Smokeless tobacco: Never Used  . Alcohol use No    Allergies as of 10/12/2015 - Review Complete 10/12/2015  Allergen Reaction Noted  . Sulfa antibiotics Rash 01/17/2013    Review of Systems:    All systems reviewed and negative except where noted in HPI.   Physical Exam:  BP (!) 170/90 (BP Location: Left Arm, Patient Position: Sitting)   Pulse 66   Temp 98.1 F (36.7 C) (Oral)   Ht 5\' 2"  (1.575 m)   Wt 132 lb (59.9 kg)   BMI 24.14 kg/m  No LMP recorded. Patient has had a hysterectomy. Psych:  Alert and cooperative. Normal mood and affect. General:   Alert,  Well-developed, well-nourished, pleasant and cooperative in NAD Head:  Normocephalic and atraumatic. Eyes:  Sclera clear, no icterus.   Conjunctiva pink. Ears:  Normal auditory acuity. Nose:  No deformity, discharge, or lesions. Mouth:  No deformity or lesions,oropharynx pink & moist. Neck:  Supple; no masses or thyromegaly. Lungs:  Respirations even and unlabored.  Clear throughout to auscultation.   No wheezes, crackles, or rhonchi. No acute distress. Heart:  Regular rate and rhythm; no murmurs, clicks, rubs, or gallops. Abdomen:  Normal bowel sounds.  No bruits.  Soft, non-tender and non-distended without masses, hepatosplenomegaly or hernias noted.  No guarding or rebound tenderness.  Negative Carnett sign.   Rectal:  Deferred.  Msk:  Symmetrical without gross deformities.  Good, equal movement & strength bilaterally. Pulses:  Normal pulses noted. Extremities:  No  clubbing or edema.  No cyanosis. Neurologic:  Alert and oriented x3;  grossly normal neurologically. Skin:  Intact without significant lesions or rashes.  No jaundice. Lymph Nodes:  No significant cervical adenopathy. Psych:  Alert and cooperative. Normal mood and affect.  Imaging Studies: No results found.  Assessment and Plan:   Elizabeth Rivera is a 65 y.o. y/o female With a history of constipation that is being controlled now with Dulcolax every 3 days.  The patient reports that her biggest problem at the present time is that she has to manually  Stimulate her rectum to start a bowel movement.  She also reports that her stools are quite soft.  The patient has been told that she should adjust her medication to try and firm her stools up and she may want to try Citrucel powder once a day to see if this boxer stools up and if it doesn't try twice a day.  The patient has been explained the plan and agrees with it.   Note: This dictation was prepared with Dragon dictation along with smaller phrase technology. Any transcriptional errors that result from this process are unintentional.

## 2015-10-12 NOTE — Patient Instructions (Signed)
Please call us if you have any questions or concerns. 

## 2015-12-29 ENCOUNTER — Ambulatory Visit: Payer: PPO | Admitting: Surgery

## 2016-01-04 ENCOUNTER — Other Ambulatory Visit: Payer: Self-pay

## 2016-01-04 ENCOUNTER — Ambulatory Visit (INDEPENDENT_AMBULATORY_CARE_PROVIDER_SITE_OTHER): Payer: PPO | Admitting: Gastroenterology

## 2016-01-04 DIAGNOSIS — K580 Irritable bowel syndrome with diarrhea: Secondary | ICD-10-CM

## 2016-01-04 NOTE — Progress Notes (Signed)
Primary Care Physician: Kirk Ruths., MD  Primary Gastroenterologist:  Dr. Lucilla Lame  No chief complaint on file.   HPI: Elizabeth Rivera is a 65 y.o. female here for diarrhea. The patient states that she has been having diarrhea since she last saw me in September. The patient has had chronic constipation with the need for manual disimpaction frequently. The patient was doing well on suppositories every 3 days but states that since approximately September she has been unable to have a solid bowel movement. The patient states that at times she'll have to use a entire roll of toilet paper to clean herself up. The patient has continued to use her suppositories because if she does not take the suppositories she will not have a bowel movement. Her typical results are that she takes a suppositories on day 1 and has 8 bowel movements that day including soiling herself at night. The next day she has approximate 4 bowel movements and then the following day she has no bowel movements with abdominal distention and gas. She then will do the suppository and repeat the cycle over again.  Current Outpatient Prescriptions  Medication Sig Dispense Refill  . ALPRAZolam (XANAX) 0.25 MG tablet Take 1 tablet (0.25 mg total) by mouth 2 (two) times daily. 30 tablet 0  . atorvastatin (LIPITOR) 40 MG tablet Take 1 tablet (40 mg total) by mouth daily at 6 PM. 90 tablet 3  . cholecalciferol (VITAMIN D) 1000 units tablet Take 1,000 Units by mouth daily.    . cloNIDine (CATAPRES) 0.1 MG tablet Take by mouth.    . clopidogrel (PLAVIX) 75 MG tablet Take 1 tablet (75 mg total) by mouth daily. 90 tablet 1  . estradiol (ESTRACE) 0.1 MG/GM vaginal cream Place 1 Applicatorful vaginally at bedtime. 42.5 g 12  . ferrous sulfate (CVS IRON) 325 (65 FE) MG tablet Take 1 tablet (325 mg total) by mouth daily with breakfast.    . gabapentin (NEURONTIN) 100 MG capsule Take 100 mg by mouth 3 (three) times daily.    .  hydrochlorothiazide (HYDRODIURIL) 25 MG tablet Take 1 tablet (25 mg total) by mouth daily. 90 tablet 1  . insulin glargine (LANTUS) 100 UNIT/ML injection Inject 9 Units into the skin at bedtime.    Marland Kitchen levothyroxine (SYNTHROID, LEVOTHROID) 112 MCG tablet     . levothyroxine (SYNTHROID, LEVOTHROID) 125 MCG tablet     . lisinopril (PRINIVIL,ZESTRIL) 40 MG tablet Take 1 tablet (40 mg total) by mouth daily. (Patient taking differently: Take 2.5 mg by mouth daily. ) 90 tablet 0  . Melatonin 5 MG TABS Take 5 mg by mouth at bedtime.    . metFORMIN (GLUCOPHAGE) 1000 MG tablet Take 0.5 tablets (500 mg total) by mouth 2 (two) times daily with a meal. 90 tablet 3  . metoprolol succinate (TOPROL-XL) 100 MG 24 hr tablet Take 1 tablet (100 mg total) by mouth daily. Take with or immediately following a meal. 90 tablet 3  . mirabegron ER (MYRBETRIQ) 25 MG TB24 tablet Take 1 tablet (25 mg total) by mouth daily. 30 tablet 11  . pantoprazole (PROTONIX) 40 MG tablet Take 1 tablet (40 mg total) by mouth 2 (two) times daily. 180 tablet 1  . ranitidine (ZANTAC) 300 MG tablet Take 1 tablet (300 mg total) by mouth daily. 90 tablet 3  . venlafaxine XR (EFFEXOR-XR) 150 MG 24 hr capsule Take 1 capsule (150 mg total) by mouth 2 (two) times daily. 180 capsule 3  . vitamin B-12 (  CYANOCOBALAMIN) 1000 MCG tablet Take 1,000 mcg by mouth daily.      No current facility-administered medications for this visit.     Allergies as of 01/04/2016 - Review Complete 10/12/2015  Allergen Reaction Noted  . Sulfa antibiotics Rash 01/17/2013    ROS:  General: Negative for anorexia, weight loss, fever, chills, fatigue, weakness. ENT: Negative for hoarseness, difficulty swallowing , nasal congestion. CV: Negative for chest pain, angina, palpitations, dyspnea on exertion, peripheral edema.  Respiratory: Negative for dyspnea at rest, dyspnea on exertion, cough, sputum, wheezing.  GI: See history of present illness. GU:  Negative for  dysuria, hematuria, urinary incontinence, urinary frequency, nocturnal urination.  Endo: Negative for unusual weight change.    Physical Examination:   There were no vitals taken for this visit.  General: Well-nourished, well-developed in no acute distress.  Eyes: No icterus. Conjunctivae pink. Extremities: No lower extremity edema. No clubbing or deformities. Neuro: Alert and oriented x 3.  Grossly intact. Skin: Warm and dry, no jaundice.   Psych: Alert and cooperative, normal mood and affect.  Labs:    Imaging Studies: No results found.  Assessment and Plan:   Elizabeth Rivera is a 65 y.o. y/o female who has a history of irritable bowel syndrome who has been chronically constipated who now comes in with diarrhea wrist after she takes her suppository and reports it to be 8 times the first day then the following day 4 episodes of diarrhea and then on the third day no our movements until she does the suppositories again. The patient has been told to increase her fiber to twice a day instead of once a day. She will also cut the suppository and half and see if that helps her symptoms. The patient had been tried on Linzess in the past and it gave her profuse diarrhea. She also tried Amitiza which did the same. I have given her samples of the lowest dose of Linzess at 72 g to see if this helps if decreasing her suppository dose does not help. The patient has been explained the plan and agrees with it.    Lucilla Lame, MD. [facility]   Note: This dictation was prepared with Dragon dictation along with smaller phrase technology. Any transcriptional errors that result from this process are unintentional.

## 2016-01-10 ENCOUNTER — Encounter: Payer: Self-pay | Admitting: Urology

## 2016-01-10 ENCOUNTER — Ambulatory Visit: Payer: PPO | Admitting: Urology

## 2016-01-10 VITALS — BP 169/77 | HR 71 | Ht 62.0 in | Wt 126.5 lb

## 2016-01-10 DIAGNOSIS — N3941 Urge incontinence: Secondary | ICD-10-CM | POA: Diagnosis not present

## 2016-01-10 NOTE — Progress Notes (Signed)
01/10/2016 11:10 AM   [name] 08/13/1950 [id number]  Referring provider: Tonia Ghent, MD [address] [place], [place] [address]  Chief Complaint  Patient presents with  . Follow-up    urge incontinence of urine, chronic cystitis    HPI: The patient is a 65 year old female with a history of stroke in February 2017, urinary retention and UTI who presents today for urinary frequency and urgency. She presents today with worsening of her urge and urge incontinence. She denies any leakage with coughing or sneezing. She has such severe urgency with incontinence that she has to wear depends. She often will still clear depends at night. She finds this very bothersome. She was recently treated for urinary tract infection. She has no dysuria or symptoms of a urinary tract infection at this time. She does have a history of urinary retention of approximately 200-250 cc. Her PVR was however 0. So she was started on Myrbetriq. She had a history of stroke x2 so we did not want to start her on an anticholinergic due to her baseline mental status.  She is very happy with the Myrbetriq. Her symptoms have completely resolved. She has no urgency or incontinence. Her PVR is 80 cc. She is having difficulty affording her medication.  Interval: The patient presents today for follow-up of her overactive bladder. Prior to starting on myrbetriq she was getting up 4 times at night and having urinary frequency less than once every hour. She did not have any incontinence. Now that she is on myrbetriq, she is getting up once nightly and voids every 3 hours or so. She feels that she empties her bladder completely. She has not had any ongoing voiding symptoms.    PMH: Past Medical History:  Diagnosis Date  . Anemia   . Anemia of other chronic disease 09/01/2013  . Anxiety   . Candidal vaginitis   . Diabetes mellitus without complication ([place])   . Diverticulosis of colon (without mention of  hemorrhage) 05/20/2009   Internal hemms (Dr. Vira Agar)  . Forearm fracture    2013, left  . GERD (gastroesophageal reflux disease)    reflux HH 09/01/2002//egd reflux Esoph. HH Gastritis nonbleeding erosive gastropathy Duodentitis 08/15/2006  . Hyperglycemia   . Hyperlipidemia   . Hypertension   . Insomnia, unspecified   . Irritable bowel syndrome    history of  . Nervous breakdown 08/2002   [facility]  . Neuropathy ([place])   . Obesity   . Other abnormal blood chemistry   . Other chest pain    chest discomfort  . Other esophagitis    erosive esophagitis  . Other specified disorders of adrenal glands 07/2007   adrenal mass via of CT 1.4cm left Adrenal no change(Dr.Cope)   . Other specified gastritis without mention of hemorrhage    erosvie gastritis  . Pain in limb    Right leg pain  . Stroke ([place]) 03/2014  . Stroke ([place]) 03/2015  . Thyroid disease    hypothyroid    Surgical History: Past Surgical History:  Procedure Laterality Date  . ABDOMINAL HYSTERECTOMY  1995   Part Hyst BSO DUB ovarian cyst B9  . BREAST CYST ASPIRATION Bilateral   . BREAST REDUCTION SURGERY  1984  . CHOLECYSTECTOMY  03/2004  . COLONOSCOPY  1. 08/15/06  2. 05/20/09   Int Hemms  2. Divertics Int Hemms (Dr. Vira Agar)  . CT ABD W & PELVIS WO CM  6/09   CT Scan abd stable  adrenal adenoma 1.4cm left adrenal no change (Dr. Jacqlyn Larsen)  . CYSTOSCOPY  01/08/2008   Former site of inflammation healed (Dr. Jacqlyn Larsen)  . ESOPHAGOGASTRODUODENOSCOPY  1. 09/01/02  2. 08/15/06   1. Reflux, HH  2. Reflux esoph HH gaastritis nonbleeding erosive gastropathy duodenitis  . NSVD x1  1977  . REDUCTION MAMMAPLASTY  1985  . VAGINAL DELIVERY  1977    Home Medications:    Medication List       Accurate as of 01/10/16 11:10 AM. Always use your most recent med list.          ALPRAZolam 0.25 MG tablet Commonly known as:  XANAX Take 1 tablet (0.25 mg total) by mouth 2 (two) times daily.   atorvastatin 40 MG tablet Commonly known  as:  LIPITOR Take 1 tablet (40 mg total) by mouth daily at 6 PM.   cholecalciferol 1000 units tablet Commonly known as:  VITAMIN D Take 1,000 Units by mouth daily.   cloNIDine 0.1 MG tablet Commonly known as:  CATAPRES Take by mouth.   clopidogrel 75 MG tablet Commonly known as:  PLAVIX Take 1 tablet (75 mg total) by mouth daily.   estradiol 0.1 MG/GM vaginal cream Commonly known as:  ESTRACE Place 1 Applicatorful vaginally at bedtime.   ferrous sulfate 325 (65 FE) MG tablet Commonly known as:  [employer] IRON Take 1 tablet (325 mg total) by mouth daily with breakfast.   gabapentin 100 MG capsule Commonly known as:  NEURONTIN Take 100 mg by mouth 3 (three) times daily.   hydrochlorothiazide 25 MG tablet Commonly known as:  HYDRODIURIL Take 1 tablet (25 mg total) by mouth daily.   insulin glargine 100 UNIT/ML injection Commonly known as:  LANTUS Inject 9 Units into the skin at bedtime.   levothyroxine 125 MCG tablet Commonly known as:  SYNTHROID, LEVOTHROID   levothyroxine 112 MCG tablet Commonly known as:  SYNTHROID, LEVOTHROID   lisinopril 40 MG tablet Commonly known as:  PRINIVIL,ZESTRIL Take 1 tablet (40 mg total) by mouth daily.   Melatonin 5 MG Tabs Take 5 mg by mouth at bedtime.   metFORMIN 1000 MG tablet Commonly known as:  GLUCOPHAGE Take 0.5 tablets (500 mg total) by mouth 2 (two) times daily with a meal.   metoprolol succinate 100 MG 24 hr tablet Commonly known as:  TOPROL-XL Take 1 tablet (100 mg total) by mouth daily. Take with or immediately following a meal.   mirabegron ER 25 MG Tb24 tablet Commonly known as:  MYRBETRIQ Take 1 tablet (25 mg total) by mouth daily.   pantoprazole 40 MG tablet Commonly known as:  PROTONIX Take 1 tablet (40 mg total) by mouth 2 (two) times daily.   ranitidine 300 MG tablet Commonly known as:  ZANTAC Take 1 tablet (300 mg total) by mouth daily.   venlafaxine XR 150 MG 24 hr capsule Commonly known as:   EFFEXOR-XR Take 1 capsule (150 mg total) by mouth 2 (two) times daily.   vitamin B-12 1000 MCG tablet Commonly known as:  CYANOCOBALAMIN Take 1,000 mcg by mouth daily.       Allergies:  Allergies  Allergen Reactions  . Sulfa Antibiotics Rash    Family History: Family History  Problem Relation Age of Onset  . Hypertension Mother   . Hyperlipidemia Mother   . Stroke Mother     47  . Vision loss Father     vision problems  . Cancer Father     Colon   . Hyperlipidemia  Sister   . Hypertension Sister   . Vision loss Sister   . Breast cancer Maternal Grandmother 58  . Breast cancer Cousin     paternal cousin  . Prostate cancer Paternal Grandfather   . Bladder Cancer Neg Hx   . Kidney cancer Neg Hx     Social History:  reports that she has never smoked. She has never used smokeless tobacco. She reports that she does not drink alcohol or use drugs.  ROS: UROLOGY Frequent Urination?: Yes Hard to postpone urination?: Yes Burning/pain with urination?: Yes Get up at night to urinate?: Yes Leakage of urine?: Yes Urine stream starts and stops?: No Trouble starting stream?: No Do you have to strain to urinate?: No Blood in urine?: No Urinary tract infection?: Yes Sexually transmitted disease?: No Injury to kidneys or bladder?: No Painful intercourse?: No Weak stream?: No Currently pregnant?: No Vaginal bleeding?: No Last menstrual period?: n  Gastrointestinal Nausea?: No Vomiting?: No Indigestion/heartburn?: No Diarrhea?: Yes Constipation?: Yes  Constitutional Fever: No Night sweats?: No Weight loss?: Yes Fatigue?: Yes  Skin Skin rash/lesions?: No Itching?: No  Eyes Blurred vision?: No Double vision?: No  Ears/Nose/Throat Sore throat?: No Sinus problems?: Yes  Hematologic/Lymphatic Swollen glands?: No Easy bruising?: Yes  Cardiovascular Leg swelling?: Yes Chest pain?: Yes  Respiratory Cough?: Yes Shortness of breath?:  No  Endocrine Excessive thirst?: Yes  Musculoskeletal Back pain?: No Joint pain?: Yes  Neurological Headaches?: Yes Dizziness?: No  Psychologic Depression?: Yes Anxiety?: Yes  Physical Exam: BP (!) 169/77 (BP Location: Left Arm, Patient Position: Sitting, Cuff Size: Normal)   Pulse 71   Ht 5\' 2"  (1.575 m)   Wt 57.4 kg (126 lb 8 oz)   BMI 23.14 kg/m   Constitutional:  Alert and oriented, No acute distress.  Laboratory Data: Lab Results  Component Value Date   WBC 5.1 04/02/2015   HGB 10.5 (L) 04/02/2015   HCT 31.0 (L) 04/02/2015   MCV 83.5 04/02/2015   PLT 171 04/02/2015    Lab Results  Component Value Date   CREATININE 0.82 04/08/2015    No results found for: PSA  No results found for: TESTOSTERONE  Lab Results  Component Value Date   HGBA1C 7.4 (H) 03/12/2015    Urinalysis    Component Value Date/Time   COLORURINE YELLOW (A) 03/11/2015 1227   APPEARANCEUR Cloudy (A) 09/02/2015 1052   LABSPEC 1.010 03/11/2015 1227   LABSPEC 1.010 06/03/2014 1446   PHURINE 7.0 03/11/2015 1227   GLUCOSEU Negative 09/02/2015 1052   GLUCOSEU >=500 06/03/2014 1446   HGBUR NEGATIVE 03/11/2015 1227   BILIRUBINUR Negative 09/02/2015 1052   BILIRUBINUR Negative 06/03/2014 1446   KETONESUR TRACE (A) 03/11/2015 1227   PROTEINUR Negative 09/02/2015 1052   PROTEINUR >500 (A) 03/11/2015 1227   UROBILINOGEN 0.2 05/04/2015 1430   UROBILINOGEN 0.2 04/03/2008 1410   NITRITE Positive (A) 09/02/2015 1052   NITRITE NEGATIVE 03/11/2015 1227   LEUKOCYTESUR 2+ (A) 09/02/2015 1052   LEUKOCYTESUR 1+ 06/03/2014 1446     Assessment & Plan:    The patient has overactive bladder symptoms that are well treated with myrbetriq 25 mg daily. I provided the patient with another month's supply. Given the efficacy of the medicationand her failure of others, we have opted to provide the medication for the patient on a monthly basis. This was according to a agreement that was worked up with [employer]. The patient will follow-up with [place] in clinic in 6 months. We will plan  to provide her myrbetriq 25 mg dailyx  1 month on a monthly basis.   Return in about 6 months (around 07/10/2016).  Ardis Hughs, [facility] [address] [place], [place] [address] [phone]

## 2016-01-24 ENCOUNTER — Encounter: Payer: Self-pay | Admitting: *Deleted

## 2016-01-24 ENCOUNTER — Emergency Department: Payer: PPO

## 2016-01-24 ENCOUNTER — Emergency Department
Admission: EM | Admit: 2016-01-24 | Discharge: 2016-01-25 | Disposition: A | Discharge status: Home / Self Care | Payer: PPO | Attending: Emergency Medicine | Admitting: Emergency Medicine

## 2016-01-24 DIAGNOSIS — R4701 Aphasia: Secondary | ICD-10-CM | POA: Insufficient documentation

## 2016-01-24 DIAGNOSIS — R410 Disorientation, unspecified: Secondary | ICD-10-CM | POA: Diagnosis not present

## 2016-01-24 DIAGNOSIS — Z5181 Encounter for therapeutic drug level monitoring: Secondary | ICD-10-CM | POA: Diagnosis not present

## 2016-01-24 DIAGNOSIS — R1310 Dysphagia, unspecified: Secondary | ICD-10-CM | POA: Diagnosis present

## 2016-01-24 DIAGNOSIS — R471 Dysarthria and anarthria: Secondary | ICD-10-CM | POA: Diagnosis not present

## 2016-01-24 DIAGNOSIS — E119 Type 2 diabetes mellitus without complications: Secondary | ICD-10-CM | POA: Diagnosis not present

## 2016-01-24 DIAGNOSIS — I10 Essential (primary) hypertension: Secondary | ICD-10-CM | POA: Insufficient documentation

## 2016-01-24 DIAGNOSIS — E039 Hypothyroidism, unspecified: Secondary | ICD-10-CM | POA: Insufficient documentation

## 2016-01-24 LAB — ETHANOL

## 2016-01-24 LAB — CBC WITH DIFFERENTIAL/PLATELET
Basophils Absolute: 0 10*3/uL (ref 0–0.1)
Basophils Relative: 1 %
EOS ABS: 0.1 10*3/uL (ref 0–0.7)
Eosinophils Relative: 2 %
HCT: 31.9 % — ABNORMAL LOW (ref 35.0–47.0)
HEMOGLOBIN: 10.8 g/dL — AB (ref 12.0–16.0)
LYMPHS ABS: 1.8 10*3/uL (ref 1.0–3.6)
Lymphocytes Relative: 29 %
MCH: 28.2 pg (ref 26.0–34.0)
MCHC: 33.8 g/dL (ref 32.0–36.0)
MCV: 83.4 fL (ref 80.0–100.0)
Monocytes Absolute: 0.5 10*3/uL (ref 0.2–0.9)
Monocytes Relative: 8 %
NEUTROS ABS: 3.7 10*3/uL (ref 1.4–6.5)
NEUTROS PCT: 60 %
Platelets: 200 10*3/uL (ref 150–440)
RBC: 3.82 MIL/uL (ref 3.80–5.20)
RDW: 14.2 % (ref 11.5–14.5)
WBC: 6.1 10*3/uL (ref 3.6–11.0)

## 2016-01-24 LAB — COMPREHENSIVE METABOLIC PANEL
ALT: 15 U/L (ref 14–54)
ANION GAP: 10 (ref 5–15)
AST: 33 U/L (ref 15–41)
Albumin: 3.4 g/dL — ABNORMAL LOW (ref 3.5–5.0)
Alkaline Phosphatase: 48 U/L (ref 38–126)
BUN: 26 mg/dL — ABNORMAL HIGH (ref 6–20)
CHLORIDE: 99 mmol/L — AB (ref 101–111)
CO2: 24 mmol/L (ref 22–32)
CREATININE: 1.03 mg/dL — AB (ref 0.44–1.00)
Calcium: 8.7 mg/dL — ABNORMAL LOW (ref 8.9–10.3)
GFR calc non Af Amer: 57 mL/min — ABNORMAL LOW (ref >60)
Glucose, Bld: 242 mg/dL — ABNORMAL HIGH (ref 65–99)
POTASSIUM: 3.4 mmol/L — AB (ref 3.5–5.1)
Sodium: 133 mmol/L — ABNORMAL LOW (ref 135–145)
Total Bilirubin: 0.7 mg/dL (ref 0.3–1.2)
Total Protein: 7.1 g/dL (ref 6.5–8.1)

## 2016-01-24 LAB — TROPONIN I

## 2016-01-24 LAB — PROTIME-INR
INR: 0.97
Prothrombin Time: 12.9 seconds (ref 11.4–15.2)

## 2016-01-24 LAB — TYPE AND SCREEN
ABO/RH(D): A POS
Antibody Screen: NEGATIVE

## 2016-01-24 NOTE — ED Notes (Signed)
Patient transported to MRI 

## 2016-01-24 NOTE — ED Provider Notes (Signed)
-----------------------------------------   11:26 PM on 01/24/2016 -----------------------------------------  Signed out to me this evening by Dr. Karma Greaser, patient withabout 5 minutes of difficulty speaking went rapidly back to baseline and has been at her baseline ever since. This time she has no complains.Cranial nerves II through XII are grossly intact 5 out of 5 strength bilateral upper and lower extremity. Finger to nose within normal limits heel to shin within normal limits, speech is normal with no word finding difficulty or dysarthria, reflexes symmetric, pupils are equally round and reactive to light, there is no pronator drift, sensation is normal, vision is intact to confrontation, gait is deferred, there is no nystagmus, normal neurologic exam The patient would like to go home. MRI is negative. According to the plan for the prior physician, if MRI is negative patient is to go home. She does have an outpatient neurologist and she will call them. No indication for TPA, no evidence of ongoing symptoms and very unclear if this was a TIA or not. In any event, MRI is negative and patient would prefer to go home and we will do so. She understands the risk of going homewhich includes our inability to continue neurologic checks on her however she has been here for some time with no neurologic symptoms, and she is at her baseline with a negative MRI. She is already had a stroke workup in the past. She is already taking medication. No evidence of bleed. She has close outpatient follow-up. I do not think this plan is unreasonable. Return precautions and follow-up given and understood.   Schuyler Amor, MD 01/24/16 [phone]

## 2016-01-24 NOTE — ED Triage Notes (Addendum)
Pt arrives via EMS, pt was eating a restaurant and did not feel right, states she had a syncopal episode, upon EMS arrival pt was awake but complaining of trouble swallowing and difficulty speaking, upon arrival pt denies any pain, states feeling as if her head is tight, states she feels like she has chicken stuck in her throat, speaking in full clear sentrances but states she is speaking slower then normal

## 2016-01-24 NOTE — ED Provider Notes (Signed)
[facility] Emergency Department Provider Note  ____________________________________________   First MD Initiated Contact with Patient 01/24/16 1859     (approximate)  I have reviewed the triage vital signs and the nursing notes.   HISTORY  Chief Complaint Dysphagia    HPI Elizabeth Rivera is a 65 y.o. female with a complicated neurological medical history including a prior stroke about a year and half ago for which she takes Plavix only who presents for evaluation of strokelike symptoms.  She reports that about an hour and half prior to arrival in the emergency department she was going to dinner with her husband and she had acute onset of a very odd sensation all over her body with a sensation of pressure in her head.  She felt like she could not speak; she was trying to find her words but states that her mouth would not work and she was unable to speak.  She also was having trouble swallowing.  She felt off balance.  She denies headache other than the pressure sensation, neck pain, chest pain, shortness of breath, nausea, vomiting, abdominal pain, dysuria.  Patient states that her symptoms have almost completely resolved at this time.   Past Medical History:  Diagnosis Date  . Anemia   . Anemia of other chronic disease 09/01/2013  . Anxiety   . Candidal vaginitis   . Diabetes mellitus without complication ([place])   . Diverticulosis of colon (without mention of hemorrhage) 05/20/2009   Internal hemms (Dr. Vira Agar)  . Forearm fracture    2013, left  . GERD (gastroesophageal reflux disease)    reflux HH 09/01/2002//egd reflux Esoph. HH Gastritis nonbleeding erosive gastropathy Duodentitis 08/15/2006  . Hyperglycemia   . Hyperlipidemia   . Hypertension   . Insomnia, unspecified   . Irritable bowel syndrome    history of  . Nervous breakdown 08/2002   [facility]  . Neuropathy ([place])   . Obesity   . Other abnormal blood chemistry   . Other chest pain     chest discomfort  . Other esophagitis    erosive esophagitis  . Other specified disorders of adrenal glands 07/2007   adrenal mass via of CT 1.4cm left Adrenal no change(Dr.Cope)   . Other specified gastritis without mention of hemorrhage    erosvie gastritis  . Pain in limb    Right leg pain  . Stroke ([place]) 03/2014  . Stroke ([place]) 03/2015  . Thyroid disease    hypothyroid    Patient Active Problem List   Diagnosis Date Noted  . H/O adenomatous polyp of colon 05/18/2015  . Recurrent UTI 05/04/2015  . Cerebrovascular accident (CVA) due to occlusion of cerebral artery ([place])   . Petechial rash   . Venous hemorrhage   . Petechiae   . Absolute anemia   . Accelerated hypertension 03/11/2015  . Intractable nausea and vomiting 03/11/2015  . Anxiety 03/11/2015  . Stroke ([place]) 03/11/2015  . Hand pain 09/09/2014  . Acquired hypothyroidism 07/15/2014  . Major depression in remission ([place]) 07/15/2014  . Pure hypercholesterolemia 07/15/2014  . Type 2 diabetes mellitus with other diabetic neurological complication 60/11/9321  . Type 2 diabetes mellitus with neurological manifestations, uncontrolled ([place]) 07/15/2014  . Psychomotor retardation 05/25/2014  . Syncope, cardiogenic 05/05/2014  . Tremor 05/05/2014  . FOM (frequency of micturition) 03/04/2014  . Multiple falls 11/28/2013  . Elevated immunoglobulin A 09/02/2013  . Anemia of other chronic disease 09/01/2013  . Barrett's esophagus 08/18/2013  . IBS (  irritable bowel syndrome) 08/05/2013  . IDA (iron deficiency anemia) 08/05/2013  . Other malaise and fatigue 07/29/2013  . Diarrhea 03/18/2013  . DM (diabetes mellitus), type 2 with neurological complications ([place]) 98/26/4158  . Costal chondritis 07/29/2012  . Bilateral leg pain 07/02/2012  . Atrophic vaginitis 10/12/2011  . Urinary incontinence 09/25/2011  . Urge incontinence of urine 09/21/2011  . Diaphoresis 12/21/2010  . Leg edema 05/11/2010  . B12 deficiency 05/11/2010    . BENIGN POSITIONAL VERTIGO 01/06/2009  . ABDOMINAL PAIN, LEFT UPPER QUADRANT 12/10/2008  . UNSPECIFIED VITAMIN D DEFICIENCY 09/07/2008  . ROSACEA 09/07/2008  . Hereditary and idiopathic peripheral neuropathy 08/24/2008  . Other chest pain 05/19/2008  . EROSIVE ESOPHAGITIS 02/03/2008  . EROSIVE GASTRITIS 02/03/2008  . Female genuine stress incontinence 08/29/2007  . Anxiety state 02/14/2007  . OBESITY 11/15/2006  . Extremity pain 09/28/2006  . ADRENAL MASS VIA CT 07/07/2006  . Hypothyroidism 07/05/2006  . HYPERLIPIDEMIA, MIXED 07/05/2006  . Essential hypertension 07/05/2006  . GERD 07/05/2006  . DIVERTICULOSIS, COLON 07/05/2006  . IBS 07/05/2006  . INSOMNIA 07/05/2006  . Bladder infection, chronic 06/14/2006    Past Surgical History:  Procedure Laterality Date  . ABDOMINAL HYSTERECTOMY  1995   Part Hyst BSO DUB ovarian cyst B9  . BREAST CYST ASPIRATION Bilateral   . BREAST REDUCTION SURGERY  1984  . CHOLECYSTECTOMY  03/2004  . COLONOSCOPY  1. 08/15/06  2. 05/20/09   Int Hemms  2. Divertics Int Hemms (Dr. Vira Agar)  . CT ABD W & PELVIS WO CM  6/09   CT Scan abd stable adrenal adenoma 1.4cm left adrenal no change (Dr. Jacqlyn Larsen)  . CYSTOSCOPY  01/08/2008   Former site of inflammation healed (Dr. Jacqlyn Larsen)  . ESOPHAGOGASTRODUODENOSCOPY  1. 09/01/02  2. 08/15/06   1. Reflux, HH  2. Reflux esoph HH gaastritis nonbleeding erosive gastropathy duodenitis  . NSVD x1  1977  . REDUCTION MAMMAPLASTY  1985  . VAGINAL DELIVERY  1977    Prior to Admission medications   Medication Sig Start Date End Date Taking? Authorizing Provider  ALPRAZolam (XANAX) 0.25 MG tablet Take 1 tablet (0.25 mg total) by mouth 2 (two) times daily. 03/15/15   Bettey Costa, MD  atorvastatin (LIPITOR) 40 MG tablet Take 1 tablet (40 mg total) by mouth daily at 6 PM. 04/21/15   Tonia Ghent, MD  cholecalciferol (VITAMIN D) 1000 units tablet Take 1,000 Units by mouth daily.    Historical Provider, MD  cloNIDine (CATAPRES) 0.1  MG tablet Take by mouth. 04/12/15 04/11/16  Historical Provider, MD  clopidogrel (PLAVIX) 75 MG tablet Take 1 tablet (75 mg total) by mouth daily. 04/22/15   Tonia Ghent, MD  estradiol (ESTRACE) 0.1 MG/GM vaginal cream Place 1 Applicatorful vaginally at bedtime. 06/17/15   Nori Riis, PA-C  ferrous sulfate ([employer] IRON) 325 (65 FE) MG tablet Take 1 tablet (325 mg total) by mouth daily with breakfast. 04/08/15   Tonia Ghent, MD  gabapentin (NEURONTIN) 100 MG capsule Take 100 mg by mouth 3 (three) times daily.    Historical Provider, MD  hydrochlorothiazide (HYDRODIURIL) 25 MG tablet Take 1 tablet (25 mg total) by mouth daily. 04/22/15   Tonia Ghent, MD  insulin glargine (LANTUS) 100 UNIT/ML injection Inject 9 Units into the skin at bedtime.    Historical Provider, MD  levothyroxine (SYNTHROID, LEVOTHROID) 112 MCG tablet  11/29/15   Historical Provider, MD  levothyroxine (SYNTHROID, LEVOTHROID) 125 MCG tablet  05/27/15   Historical  Provider, MD  lisinopril (PRINIVIL,ZESTRIL) 40 MG tablet Take 1 tablet (40 mg total) by mouth daily. Patient taking differently: Take 2.5 mg by mouth daily.  04/27/15   Tonia Ghent, MD  Melatonin 5 MG TABS Take 5 mg by mouth at bedtime.    Historical Provider, MD  metFORMIN (GLUCOPHAGE) 1000 MG tablet Take 0.5 tablets (500 mg total) by mouth 2 (two) times daily with a meal. 02/09/15   Tonia Ghent, MD  metoprolol succinate (TOPROL-XL) 100 MG 24 hr tablet Take 1 tablet (100 mg total) by mouth daily. Take with or immediately following a meal. 06/23/15   Tonia Ghent, MD  mirabegron ER (MYRBETRIQ) 25 MG TB24 tablet Take 1 tablet (25 mg total) by mouth daily. 09/02/15   Nickie Retort, MD  pantoprazole (PROTONIX) 40 MG tablet Take 1 tablet (40 mg total) by mouth 2 (two) times daily. 04/25/15   Tonia Ghent, MD  ranitidine (ZANTAC) 300 MG tablet Take 1 tablet (300 mg total) by mouth daily. 01/08/15   Tonia Ghent, MD  venlafaxine XR (EFFEXOR-XR) 150 MG 24 hr  capsule Take 1 capsule (150 mg total) by mouth 2 (two) times daily. 01/08/15   Tonia Ghent, MD  vitamin B-12 (CYANOCOBALAMIN) 1000 MCG tablet Take 1,000 mcg by mouth daily.     Historical Provider, MD    Allergies Sulfa antibiotics  Family History  Problem Relation Age of Onset  . Hypertension Mother   . Hyperlipidemia Mother   . Stroke Mother     44  . Vision loss Father     vision problems  . Cancer Father     Colon   . Hyperlipidemia Sister   . Hypertension Sister   . Vision loss Sister   . Breast cancer Maternal Grandmother 99  . Breast cancer Cousin     paternal cousin  . Prostate cancer Paternal Grandfather   . Bladder Cancer Neg Hx   . Kidney cancer Neg Hx     Social History Social History  Substance Use Topics  . Smoking status: Never Smoker  . Smokeless tobacco: Never Used  . Alcohol use No    Review of Systems Constitutional: No fever/chills Eyes: No visual changes. ENT: No sore throat. Cardiovascular: Denies chest pain. Respiratory: Denies shortness of breath. Gastrointestinal: No abdominal pain.  No nausea, no vomiting.  No diarrhea.  No constipation. Genitourinary: Negative for dysuria. Musculoskeletal: Negative for back pain. Skin: Negative for rash. Neurological: Acute onset dysphagia, dysarthria/aphasia, feeling off-balance, and head pressure as well as some confusion.  All the symptoms have nearly completely resolved.   10-point ROS otherwise negative.  ____________________________________________   PHYSICAL EXAM:  VITAL SIGNS: ED Triage Vitals  Enc Vitals Group     BP 01/24/16 1855 105/77     Pulse Rate 01/24/16 1855 71     Resp 01/24/16 1855 18     Temp 01/24/16 1855 97.3 F (36.3 C)     Temp Source 01/24/16 1855 Oral     SpO2 01/24/16 1855 99 %     Weight 01/24/16 1854 126 lb (57.2 kg)     Height 01/24/16 1854 5\' 2"  (1.575 m)     Head Circumference --      Peak Flow --      Pain Score --      Pain Loc --      Pain Edu?  --      Excl. in [place]? --     Constitutional: Alert  and oriented. Pale, no acute distress. Eyes: Conjunctivae are normal. PERRL. EOMI. Head: Atraumatic. Nose: No congestion/rhinnorhea. Mouth/Throat: Mucous membranes are moist.  Oropharynx non-erythematous. Neck: No stridor.  No meningeal signs.   Cardiovascular: Normal rate, regular rhythm. Good peripheral circulation. Grossly normal heart sounds. Respiratory: Normal respiratory effort.  No retractions. Lungs CTAB. Gastrointestinal: Soft and nontender. No distention.  Musculoskeletal: No lower extremity tenderness nor edema. No gross deformities of extremities. Neurologic:  Cranial nerves are grossly intact with no evidence of facial droop and she has no decreased sensation.  She has good bilateral grip strength and normal strength in her muscle groups in upper and lower extremities with no difference between sides.  She has no evidence of dysmetria.  She has no aphasia at this time and slightly slurred her words when repeating the phrase "no if's, and, or but's" but she was able to articulate the words and then asked "did I get it right?"  See below for NIHSS. Skin:  Skin is warm, dry and intact. No rash noted.  ____________________________________________   LABS (all labs ordered are listed, but only abnormal results are displayed)  Labs Reviewed  COMPREHENSIVE METABOLIC PANEL - Abnormal; Notable for the following:       Result Value   Sodium 133 (*)    Potassium 3.4 (*)    Chloride 99 (*)    Glucose, Bld 242 (*)    BUN 26 (*)    Creatinine, Ser 1.03 (*)    Calcium 8.7 (*)    Albumin 3.4 (*)    GFR calc non Af Amer 57 (*)    All other components within normal limits  CBC WITH DIFFERENTIAL/PLATELET - Abnormal; Notable for the following:    Hemoglobin 10.8 (*)    HCT 31.9 (*)    All other components within normal limits  ETHANOL  TROPONIN I  PROTIME-INR  URINE DRUG SCREEN, QUALITATIVE ([facility] ONLY)  TYPE AND SCREEN    ____________________________________________  EKG  ED ECG REPORT I, Shaheem Pichon, the attending physician, personally viewed and interpreted this ECG.  Date: 01/24/2016 EKG Time: 18:54 Rate: 74 Rhythm: normal sinus rhythm QRS Axis: normal Intervals: normal ST/T Wave abnormalities: Inverted T waves in leads 2, 3, aVF, V4, V5, V6.  No ST segment elevation or depression Conduction Disturbances: none Narrative Interpretation: Non-specific ST segment / T-wave changes, but no evidence of acute ischemia. ____________________________________________  RADIOLOGY   Ct Head Code Stroke Wo Contrast`  Result Date: 01/24/2016 CLINICAL DATA:  Code stroke.  Syncope.  Difficulty speaking. EXAM: CT HEAD WITHOUT CONTRAST TECHNIQUE: Contiguous axial images were obtained from the base of the skull through the vertex without intravenous contrast. COMPARISON:  Brain MRI 09/28/2015 Head CT 04/02/2015 FINDINGS: Brain: No mass lesion, intraparenchymal hemorrhage or extra-axial collection. No evidence of acute cortical infarct. There is periventricular hypoattenuation compatible with chronic microvascular disease. Vascular: No hyperdense vessel or unexpected calcification. Skull: Normal visualized skull base, calvarium and extracranial soft tissues. Sinuses/Orbits: No sinus fluid levels or advanced mucosal thickening. No mastoid effusion. Normal orbits. ASPECTS [facility] Stroke Program Early CT Score) - Ganglionic level infarction (caudate, lentiform nuclei, internal capsule, insula, M1-M3 cortex): 7 - Supraganglionic infarction (M4-M6 cortex): 3 Total score (0-10 with 10 being normal): 10 IMPRESSION: 1. No acute intracranial abnormality. Unchanged findings of chronic microvascular ischemia. 2. ASPECTS is 10. These results were called by telephone at the time of interpretation on 01/24/2016 at 7:30 pm to Dr. Hinda Kehr , who verbally acknowledged these results. Electronically Signed  By: Ulyses Jarred M.D.   On:  01/24/2016 19:31    ____________________________________________   PROCEDURES  Procedure(s) performed:   Procedures   Critical Care performed: No ____________________________________________   INITIAL IMPRESSION / ASSESSMENT AND PLAN / ED COURSE  Pertinent labs & imaging results that were available during my care of the patient were reviewed by me and considered in my medical decision making (see chart for details).  I was called to the room initially due to concern for acute stroke and the possible need for calling a code stroke.    NIH Stroke Scale  Interval: Baseline Time: 7:09 PM Person Administering Scale: Yentl Verge  Administer stroke scale items in the order listed. Record performance in each category after each subscale exam. Do not go back and change scores. Follow directions provided for each exam technique. Scores should reflect what the patient does, not what the clinician thinks the patient can do. The clinician should record answers while administering the exam and work quickly. Except where indicated, the patient should not be coached (i.e., repeated requests to patient to make a special effort).   1a  Level of consciousness: 0=alert; keenly responsive  1b. LOC questions:  0=Performs both tasks correctly  1c. LOC commands: 0=Performs both tasks correctly  2.  Best Gaze: 0=normal  3.  Visual: 0=No visual loss  4. Facial Palsy: 0=Normal symmetric movement  5a.  Motor left arm: 0=No drift, limb holds 90 (or 45) degrees for full 10 seconds  5b.  Motor right arm: 0=No drift, limb holds 90 (or 45) degrees for full 10 seconds  6a. motor left leg: 0=No drift, limb holds 90 (or 45) degrees for full 10 seconds  6b  Motor right leg:  0=No drift, limb holds 90 (or 45) degrees for full 10 seconds  7. Limb Ataxia: 0=Absent  8.  Sensory: 0=Normal; no sensory loss  9. Best Language:  0=No aphasia, normal  10. Dysarthria: 1=Mild to moderate, patient slurs at least some  words and at worst, can be understood with some difficulty  11. Extinction and Inattention: 0=No abnormality  12. Distal motor function: 0=Normal   Total:   1   The patient at worst has an NIH stroke scale of one if in fact she is suffering from dysarthria but this may actually be her baseline manner of speaking.  Regardless she herself states that her symptoms have improved dramatically since their onset about an hour and a half ago and she is not a candidate for TPA due to the rapid resolution of symptoms and the very mild symptoms.  I will proceed with a stroke workup but am not activating code stroke at this time because she is not a candidate for TPA and I feel she does not need emergent evaluation by teleneurology.   Clinical Course as of Jan 23 2038  Mon Jan 24, 2016  1930 No acute findings on CT per call and phone discussion by radiologist  [CF]  1956 Reviewed prior medical records in [place].  It appears that when she had her prior CVA she had a normal head CT but a positive MR brain.  I also reviewed the results of her MRA head and saw that she had a significant amount of stenosis but that exam was only 10 months ago and is unlikely to be high yield results repeated at this time.  [CF]  2038 MR brain without contrast is pending.  I explained to the patient and family  that if her MR is normal, she can go home to follow up as an outpatient with her neurologist in [place].  If there is evidence of acute CVA, she will need to stay in the hospital.  Also of course this depends upon her remaining asymptomatic.  The patient and her family understand and agree with this plan.  Transferring care to Dr. Burlene Arnt.  [CF]    Clinical Course User Index [CF] Hinda Kehr, MD    ____________________________________________  FINAL CLINICAL IMPRESSION(S) / ED DIAGNOSES  Probable TIA   MEDICATIONS GIVEN DURING THIS VISIT:  Medications - No data to display   NEW OUTPATIENT  MEDICATIONS STARTED DURING THIS VISIT:  New Prescriptions   No medications on file    Modified Medications   No medications on file    Discontinued Medications   No medications on file     Note:  This document was prepared using Dragon voice recognition software and may include unintentional dictation errors.    Hinda Kehr, MD 01/24/16 2039

## 2016-01-24 NOTE — ED Notes (Signed)
Pt states hx of 2 strokes in past, family states pt does NOT present like a stroke, will present with N&V, flu like symptoms. Pt is alert and oriented x 4, states she was eating chicken and had trouble swallowing, states she had an episode where she couldn't see with slurred speech. Pt speech clear at this time, pt able to see, family brought glasses for pt. Pt answers all questions correctly. Pt able to move all extremities. Pt appears pale, states she is tired and cold. Warm blankets and pillow provided.

## 2016-02-21 DIAGNOSIS — E1140 Type 2 diabetes mellitus with diabetic neuropathy, unspecified: Secondary | ICD-10-CM | POA: Diagnosis not present

## 2016-02-21 DIAGNOSIS — Z794 Long term (current) use of insulin: Secondary | ICD-10-CM | POA: Diagnosis not present

## 2016-02-21 DIAGNOSIS — F325 Major depressive disorder, single episode, in full remission: Secondary | ICD-10-CM | POA: Diagnosis not present

## 2016-02-21 DIAGNOSIS — E7800 Pure hypercholesterolemia, unspecified: Secondary | ICD-10-CM | POA: Diagnosis not present

## 2016-02-21 DIAGNOSIS — E1165 Type 2 diabetes mellitus with hyperglycemia: Secondary | ICD-10-CM | POA: Diagnosis not present

## 2016-02-21 DIAGNOSIS — E039 Hypothyroidism, unspecified: Secondary | ICD-10-CM | POA: Diagnosis not present

## 2016-02-28 DIAGNOSIS — R55 Syncope and collapse: Secondary | ICD-10-CM | POA: Diagnosis not present

## 2016-02-28 DIAGNOSIS — I208 Other forms of angina pectoris: Secondary | ICD-10-CM | POA: Diagnosis not present

## 2016-02-28 DIAGNOSIS — E784 Other hyperlipidemia: Secondary | ICD-10-CM | POA: Diagnosis not present

## 2016-02-28 DIAGNOSIS — I10 Essential (primary) hypertension: Secondary | ICD-10-CM | POA: Diagnosis not present

## 2016-02-28 DIAGNOSIS — I701 Atherosclerosis of renal artery: Secondary | ICD-10-CM | POA: Diagnosis not present

## 2016-02-28 DIAGNOSIS — I639 Cerebral infarction, unspecified: Secondary | ICD-10-CM | POA: Diagnosis not present

## 2016-02-28 DIAGNOSIS — E119 Type 2 diabetes mellitus without complications: Secondary | ICD-10-CM | POA: Diagnosis not present

## 2016-02-28 DIAGNOSIS — I2510 Atherosclerotic heart disease of native coronary artery without angina pectoris: Secondary | ICD-10-CM | POA: Diagnosis not present

## 2016-02-28 DIAGNOSIS — I951 Orthostatic hypotension: Secondary | ICD-10-CM | POA: Diagnosis not present

## 2016-03-22 DIAGNOSIS — E119 Type 2 diabetes mellitus without complications: Secondary | ICD-10-CM | POA: Diagnosis not present

## 2016-03-24 ENCOUNTER — Other Ambulatory Visit: Payer: Self-pay | Admitting: Internal Medicine

## 2016-03-24 DIAGNOSIS — Z1231 Encounter for screening mammogram for malignant neoplasm of breast: Secondary | ICD-10-CM

## 2016-03-28 DIAGNOSIS — Z0100 Encounter for examination of eyes and vision without abnormal findings: Secondary | ICD-10-CM | POA: Diagnosis not present

## 2016-04-11 DIAGNOSIS — L821 Other seborrheic keratosis: Secondary | ICD-10-CM | POA: Diagnosis not present

## 2016-04-11 DIAGNOSIS — D2261 Melanocytic nevi of right upper limb, including shoulder: Secondary | ICD-10-CM | POA: Diagnosis not present

## 2016-04-11 DIAGNOSIS — D2272 Melanocytic nevi of left lower limb, including hip: Secondary | ICD-10-CM | POA: Diagnosis not present

## 2016-04-11 DIAGNOSIS — D225 Melanocytic nevi of trunk: Secondary | ICD-10-CM | POA: Diagnosis not present

## 2016-04-26 ENCOUNTER — Telehealth: Payer: Self-pay | Admitting: Urology

## 2016-04-26 NOTE — Telephone Encounter (Signed)
Pt called requesting more samples of Myrbetriq 25 mg.  Chelsea said we were out and she would call pt when we received more samples.

## 2016-04-27 ENCOUNTER — Telehealth: Payer: Self-pay

## 2016-04-27 NOTE — Telephone Encounter (Signed)
No answer. Samples left up front.

## 2016-04-28 NOTE — Telephone Encounter (Signed)
Spoke with pt in reference to samples being left up front. Pt voiced understanding.

## 2016-05-01 DIAGNOSIS — R300 Dysuria: Secondary | ICD-10-CM | POA: Diagnosis not present

## 2016-05-01 DIAGNOSIS — R3915 Urgency of urination: Secondary | ICD-10-CM | POA: Diagnosis not present

## 2016-05-02 ENCOUNTER — Ambulatory Visit
Admission: RE | Admit: 2016-05-02 | Discharge: 2016-05-02 | Disposition: A | Discharge status: Home / Self Care | Payer: Medicare HMO | Source: Ambulatory Visit | Attending: Internal Medicine | Admitting: Internal Medicine

## 2016-05-02 DIAGNOSIS — Z1231 Encounter for screening mammogram for malignant neoplasm of breast: Secondary | ICD-10-CM | POA: Diagnosis not present

## 2016-05-22 ENCOUNTER — Other Ambulatory Visit: Payer: Self-pay

## 2016-05-22 DIAGNOSIS — E039 Hypothyroidism, unspecified: Secondary | ICD-10-CM | POA: Diagnosis not present

## 2016-05-22 DIAGNOSIS — Z794 Long term (current) use of insulin: Secondary | ICD-10-CM | POA: Diagnosis not present

## 2016-05-22 DIAGNOSIS — E7800 Pure hypercholesterolemia, unspecified: Secondary | ICD-10-CM | POA: Diagnosis not present

## 2016-05-22 DIAGNOSIS — F325 Major depressive disorder, single episode, in full remission: Secondary | ICD-10-CM | POA: Diagnosis not present

## 2016-05-22 DIAGNOSIS — E1140 Type 2 diabetes mellitus with diabetic neuropathy, unspecified: Secondary | ICD-10-CM | POA: Diagnosis not present

## 2016-05-23 ENCOUNTER — Ambulatory Visit (INDEPENDENT_AMBULATORY_CARE_PROVIDER_SITE_OTHER): Payer: Medicare HMO | Admitting: Gastroenterology

## 2016-05-23 ENCOUNTER — Other Ambulatory Visit: Payer: Self-pay

## 2016-05-23 ENCOUNTER — Encounter: Payer: Self-pay | Admitting: Gastroenterology

## 2016-05-23 VITALS — BP 149/83 | HR 67 | Temp 98.1°F | Resp 16 | Ht 63.0 in | Wt 130.8 lb

## 2016-05-23 DIAGNOSIS — R1310 Dysphagia, unspecified: Secondary | ICD-10-CM | POA: Diagnosis not present

## 2016-05-23 DIAGNOSIS — K5900 Constipation, unspecified: Secondary | ICD-10-CM

## 2016-05-23 DIAGNOSIS — R1319 Other dysphagia: Secondary | ICD-10-CM

## 2016-05-23 DIAGNOSIS — K219 Gastro-esophageal reflux disease without esophagitis: Secondary | ICD-10-CM

## 2016-05-23 NOTE — Progress Notes (Signed)
Primary Care Physician: Kirk Ruths., MD  Primary Gastroenterologist:  Dr. Lucilla Lame  Chief Complaint  Patient presents with  . Dysphagia  . Constipation    since styroke has hade problems with moving bowels    HPI: Elizabeth Rivera is a 66 y.o. female here for Constipation and dysphagia. The patient has had chronic constipation with a history of irritable bowel syndrome.  The patient reports that she has to take suppositories and manually stimulate her rectum to have a bowel movement.  She also reports that she has to wipe continuously with inability to be clean causing her to have irritation around her rectum.  The patient reports that she is presently moving her bowels once a day which is much better than it has been in the past.  There is no report of any unexplained weight loss. The patient has a history of Barrett's esophagus and has not had any EGD recently.  Current Outpatient Prescriptions  Medication Sig Dispense Refill  . ALPRAZolam (XANAX) 0.25 MG tablet Take 1 tablet (0.25 mg total) by mouth 2 (two) times daily. 30 tablet 0  . atorvastatin (LIPITOR) 40 MG tablet Take 1 tablet (40 mg total) by mouth daily at 6 PM. 90 tablet 3  . Blood Glucose Monitoring Suppl (FIFTY50 GLUCOSE METER 2.0) w/Device KIT Use as directed. TRUE METRIX AIR E11.40    . cholecalciferol (VITAMIN D) 1000 units tablet Take 1,000 Units by mouth daily.    . cloNIDine (CATAPRES) 0.1 MG tablet Take by mouth.    . clopidogrel (PLAVIX) 75 MG tablet Take 1 tablet (75 mg total) by mouth daily. 90 tablet 1  . ferrous sulfate ([employer] IRON) 325 (65 FE) MG tablet Take 1 tablet (325 mg total) by mouth daily with breakfast.    . gabapentin (NEURONTIN) 100 MG capsule Take 100 mg by mouth 3 (three) times daily.    . hydrochlorothiazide (HYDRODIURIL) 25 MG tablet Take 1 tablet (25 mg total) by mouth daily. 90 tablet 1  . Insulin Glargine (LANTUS SOLOSTAR) 100 UNIT/ML Solostar Pen Inject into the skin.    Marland Kitchen insulin  glargine (LANTUS) 100 UNIT/ML injection Inject 9 Units into the skin at bedtime.    . Insulin Pen Needle (FIFTY50 PEN NEEDLES) 31G X 5 MM MISC Use as directed. E11.40    . Lancets MISC Use 1 each 3 (three) times daily. Use as instructed. Dx E11.40  TRUEPLUS  28G    . levothyroxine (SYNTHROID, LEVOTHROID) 112 MCG tablet Take by mouth.    Marland Kitchen lisinopril (PRINIVIL,ZESTRIL) 40 MG tablet Take 1 tablet (40 mg total) by mouth daily. (Patient taking differently: Take 2.5 mg by mouth daily. ) 90 tablet 0  . Melatonin 5 MG TABS Take 5 mg by mouth at bedtime.    . metFORMIN (GLUCOPHAGE) 1000 MG tablet Take 0.5 tablets (500 mg total) by mouth 2 (two) times daily with a meal. 90 tablet 3  . methylcellulose (CITRUCEL) oral powder Take by mouth.    . metoprolol succinate (TOPROL-XL) 100 MG 24 hr tablet Take 1 tablet (100 mg total) by mouth daily. Take with or immediately following a meal. 90 tablet 3  . mirabegron ER (MYRBETRIQ) 25 MG TB24 tablet Take 1 tablet (25 mg total) by mouth daily. 30 tablet 11  . mirtazapine (REMERON) 7.5 MG tablet Take by mouth.    . ranitidine (ZANTAC) 300 MG tablet Take 1 tablet (300 mg total) by mouth daily. 90 tablet 3  . ULTICARE ALCOHOL SWABS PADS  Apply 1 each topically as directed. E11.40    . venlafaxine XR (EFFEXOR-XR) 150 MG 24 hr capsule Take 1 capsule (150 mg total) by mouth 2 (two) times daily. 180 capsule 3  . vitamin B-12 (CYANOCOBALAMIN) 1000 MCG tablet Take 1,000 mcg by mouth daily.     Marland Kitchen estradiol (ESTRACE) 0.1 MG/GM vaginal cream Place 1 Applicatorful vaginally at bedtime. (Patient not taking: Reported on 05/23/2016) 42.5 g 12  . levothyroxine (SYNTHROID, LEVOTHROID) 125 MCG tablet     . pantoprazole (PROTONIX) 40 MG tablet Take 1 tablet (40 mg total) by mouth 2 (two) times daily. 180 tablet 1   No current facility-administered medications for this visit.     Allergies as of 05/23/2016 - Review Complete 05/23/2016  Allergen Reaction Noted  . Sulfa antibiotics Rash  01/17/2013    ROS:  General: Negative for anorexia, weight loss, fever, chills, fatigue, weakness. ENT: Negative for hoarseness, difficulty swallowing , nasal congestion. CV: Negative for chest pain, angina, palpitations, dyspnea on exertion, peripheral edema.  Respiratory: Negative for dyspnea at rest, dyspnea on exertion, cough, sputum, wheezing.  GI: See history of present illness. GU:  Negative for dysuria, hematuria, urinary incontinence, urinary frequency, nocturnal urination.  Endo: Negative for unusual weight change.    Physical Examination:   BP (!) 149/83   Pulse 67   Temp 98.1 F (36.7 C)   Resp 16   Ht 5' 3"  (1.6 m)   Wt 130 lb 12.8 oz (59.3 kg)   BMI 23.17 kg/m   General: Well-nourished, well-developed in no acute distress.  Eyes: No icterus. Conjunctivae pink. Mouth: Oropharyngeal mucosa moist and pink , no lesions erythema or exudate. Lungs: Clear to auscultation bilaterally. Non-labored. Heart: Regular rate and rhythm, no murmurs rubs or gallops.  Abdomen: Bowel sounds are normal, nontender, nondistended, no hepatosplenomegaly or masses, no abdominal bruits or hernia , no rebound or guarding.   Extremities: No lower extremity edema. No clubbing or deformities. Neuro: Alert and oriented x 3.  Grossly intact. Skin: Warm and dry, no jaundice.   Psych: Alert and cooperative, normal mood and affect.  Labs:    Imaging Studies: Mm Screening Breast Tomo Bilateral  Result Date: 05/02/2016 CLINICAL DATA:  Screening. EXAM: 2D DIGITAL SCREENING BILATERAL MAMMOGRAM WITH CAD AND ADJUNCT TOMO COMPARISON:  Previous exam(s). ACR Breast Density Category b: There are scattered areas of fibroglandular density. FINDINGS: There are no findings suspicious for malignancy. Images were processed with CAD. IMPRESSION: No mammographic evidence of malignancy. A result letter of this screening mammogram will be mailed directly to the patient. RECOMMENDATION: Screening mammogram in one  year. (Code:SM-B-01Y) BI-RADS CATEGORY  1: Negative. Electronically Signed   By: Lajean Manes M.D.   On: 05/02/2016 14:27    Assessment and Plan:   Elizabeth Rivera is a 66 y.o. y/o female who comes in today with a history of constipation.  The patient is now moving her bowels once a day.  The patient has also been told that she may want to use a bidet to alleviate some of her rectal irritation and possibly facilitate and stimulate her bowel movements.  The patient also reports that she has dysphagia and will be set up for an EGD due to her history of Barrett's esophagus and not having any EGD in some time.    Lucilla Lame, MD. Marval Regal   Note: This dictation was prepared with Dragon dictation along with smaller phrase technology. Any transcriptional errors that result from this process are unintentional.

## 2016-05-24 ENCOUNTER — Telehealth: Payer: Self-pay | Admitting: Gastroenterology

## 2016-05-24 NOTE — Telephone Encounter (Signed)
05/24/16 Spoke with Apolonio Schneiders at [facility] and NO prior auth required for EGD 43235 / K21.9 (ref# [id number]).

## 2016-05-25 ENCOUNTER — Encounter: Payer: Self-pay | Admitting: *Deleted

## 2016-05-26 ENCOUNTER — Telehealth: Payer: Self-pay

## 2016-05-26 ENCOUNTER — Encounter: Payer: Self-pay | Admitting: Anesthesiology

## 2016-05-26 NOTE — Telephone Encounter (Signed)
Pt's husband was notified per Dr. Clayborn Bigness, pt may stop Plavix 7 days prior to colonoscopy.

## 2016-05-29 NOTE — Discharge Instructions (Signed)

## 2016-05-30 ENCOUNTER — Encounter: Payer: Self-pay | Admitting: *Deleted

## 2016-05-30 DIAGNOSIS — Z794 Long term (current) use of insulin: Secondary | ICD-10-CM | POA: Diagnosis not present

## 2016-05-30 DIAGNOSIS — L600 Ingrowing nail: Secondary | ICD-10-CM | POA: Diagnosis not present

## 2016-05-30 DIAGNOSIS — E1140 Type 2 diabetes mellitus with diabetic neuropathy, unspecified: Secondary | ICD-10-CM | POA: Diagnosis not present

## 2016-06-01 ENCOUNTER — Ambulatory Visit: Payer: Medicare HMO | Admitting: Anesthesiology

## 2016-06-01 ENCOUNTER — Encounter
Admission: RE | Disposition: A | Discharge status: Home / Self Care | Payer: Self-pay | Source: Ambulatory Visit | Attending: Gastroenterology

## 2016-06-01 ENCOUNTER — Ambulatory Visit
Admission: RE | Admit: 2016-06-01 | Discharge: 2016-06-01 | Disposition: A | Discharge status: Home / Self Care | Payer: Medicare HMO | Source: Ambulatory Visit | Attending: Gastroenterology | Admitting: Gastroenterology

## 2016-06-01 DIAGNOSIS — Z8673 Personal history of transient ischemic attack (TIA), and cerebral infarction without residual deficits: Secondary | ICD-10-CM | POA: Insufficient documentation

## 2016-06-01 DIAGNOSIS — K589 Irritable bowel syndrome without diarrhea: Secondary | ICD-10-CM | POA: Insufficient documentation

## 2016-06-01 DIAGNOSIS — I10 Essential (primary) hypertension: Secondary | ICD-10-CM | POA: Insufficient documentation

## 2016-06-01 DIAGNOSIS — E1140 Type 2 diabetes mellitus with diabetic neuropathy, unspecified: Secondary | ICD-10-CM | POA: Diagnosis not present

## 2016-06-01 DIAGNOSIS — Z79899 Other long term (current) drug therapy: Secondary | ICD-10-CM | POA: Insufficient documentation

## 2016-06-01 DIAGNOSIS — K219 Gastro-esophageal reflux disease without esophagitis: Secondary | ICD-10-CM | POA: Insufficient documentation

## 2016-06-01 DIAGNOSIS — Z7902 Long term (current) use of antithrombotics/antiplatelets: Secondary | ICD-10-CM | POA: Diagnosis not present

## 2016-06-01 DIAGNOSIS — E039 Hypothyroidism, unspecified: Secondary | ICD-10-CM | POA: Insufficient documentation

## 2016-06-01 DIAGNOSIS — E785 Hyperlipidemia, unspecified: Secondary | ICD-10-CM | POA: Insufficient documentation

## 2016-06-01 DIAGNOSIS — K2930 Chronic superficial gastritis without bleeding: Secondary | ICD-10-CM | POA: Diagnosis not present

## 2016-06-01 DIAGNOSIS — R1310 Dysphagia, unspecified: Secondary | ICD-10-CM | POA: Diagnosis not present

## 2016-06-01 DIAGNOSIS — F419 Anxiety disorder, unspecified: Secondary | ICD-10-CM | POA: Diagnosis not present

## 2016-06-01 DIAGNOSIS — Z794 Long term (current) use of insulin: Secondary | ICD-10-CM | POA: Insufficient documentation

## 2016-06-01 DIAGNOSIS — D638 Anemia in other chronic diseases classified elsewhere: Secondary | ICD-10-CM | POA: Diagnosis not present

## 2016-06-01 HISTORY — PX: ESOPHAGOGASTRODUODENOSCOPY (EGD) WITH PROPOFOL: SHX5813

## 2016-06-01 HISTORY — DX: Unspecified osteoarthritis, unspecified site: M19.90

## 2016-06-01 HISTORY — DX: Presence of dental prosthetic device (complete) (partial): Z97.2

## 2016-06-01 LAB — GLUCOSE, CAPILLARY
GLUCOSE-CAPILLARY: 227 mg/dL — AB (ref 65–99)
Glucose-Capillary: 218 mg/dL — ABNORMAL HIGH (ref 65–99)

## 2016-06-01 SURGERY — ESOPHAGOGASTRODUODENOSCOPY (EGD) WITH PROPOFOL
Anesthesia: Monitor Anesthesia Care | Wound class: Clean Contaminated

## 2016-06-01 MED ORDER — GLYCOPYRROLATE 0.2 MG/ML IJ SOLN
INTRAMUSCULAR | Status: DC | PRN
  Administered 2016-06-01: 0.1 mg via INTRAVENOUS

## 2016-06-01 MED ORDER — LACTATED RINGERS IV SOLN
INTRAVENOUS | Status: DC
  Administered 2016-06-01: 08:00:00 via INTRAVENOUS

## 2016-06-01 MED ORDER — LIDOCAINE HCL (CARDIAC) 20 MG/ML IV SOLN
INTRAVENOUS | Status: DC | PRN
  Administered 2016-06-01: 50 mg via INTRAVENOUS

## 2016-06-01 MED ORDER — ACETAMINOPHEN 325 MG PO TABS: 325.0000 mg | ORAL_TABLET | ORAL | Status: DC | PRN

## 2016-06-01 MED ORDER — PROPOFOL 10 MG/ML IV BOLUS
INTRAVENOUS | Status: DC | PRN
  Administered 2016-06-01: 100 mg via INTRAVENOUS
  Administered 2016-06-01: 50 mg via INTRAVENOUS

## 2016-06-01 MED ORDER — ACETAMINOPHEN 160 MG/5ML PO SOLN: 325.0000 mg | ORAL | Status: DC | PRN

## 2016-06-01 SURGICAL SUPPLY — 32 items
BALLN DILATOR 10-12 8 (BALLOONS)
BALLN DILATOR 12-15 8 (BALLOONS)
BALLN DILATOR 15-18 8 (BALLOONS)
BALLN DILATOR CRE 0-12 8 (BALLOONS)
BALLN DILATOR ESOPH 8 10 CRE (MISCELLANEOUS) IMPLANT
BALLOON DILATOR 12-15 8 (BALLOONS) IMPLANT
BALLOON DILATOR 15-18 8 (BALLOONS) IMPLANT
BALLOON DILATOR CRE 0-12 8 (BALLOONS) IMPLANT
BLOCK BITE 60FR ADLT L/F GRN (MISCELLANEOUS) ×3 IMPLANT
CANISTER SUCT 1200ML W/VALVE (MISCELLANEOUS) ×3 IMPLANT
CLIP HMST 235XBRD CATH ROT (MISCELLANEOUS) IMPLANT
CLIP RESOLUTION 360 11X235 (MISCELLANEOUS)
FCP ESCP3.2XJMB 240X2.8X (MISCELLANEOUS)
FORCEPS BIOP RAD 4 LRG CAP 4 (CUTTING FORCEPS) IMPLANT
FORCEPS BIOP RJ4 240 W/NDL (MISCELLANEOUS)
FORCEPS ESCP3.2XJMB 240X2.8X (MISCELLANEOUS) IMPLANT
GOWN CVR UNV OPN BCK APRN NK (MISCELLANEOUS) ×2 IMPLANT
GOWN ISOL THUMB LOOP REG UNIV (MISCELLANEOUS) ×4
INJECTOR VARIJECT VIN23 (MISCELLANEOUS) IMPLANT
KIT DEFENDO VALVE AND CONN (KITS) IMPLANT
KIT ENDO PROCEDURE OLY (KITS) ×3 IMPLANT
MARKER SPOT ENDO TATTOO 5ML (MISCELLANEOUS) IMPLANT
PAD GROUND ADULT SPLIT (MISCELLANEOUS) IMPLANT
RETRIEVER NET PLAT FOOD (MISCELLANEOUS) IMPLANT
SNARE SHORT THROW 13M SML OVAL (MISCELLANEOUS) IMPLANT
SNARE SHORT THROW 30M LRG OVAL (MISCELLANEOUS) IMPLANT
SPOT EX ENDOSCOPIC TATTOO (MISCELLANEOUS)
SYR INFLATION 60ML (SYRINGE) IMPLANT
TRAP ETRAP POLY (MISCELLANEOUS) IMPLANT
VARIJECT INJECTOR VIN23 (MISCELLANEOUS)
WATER STERILE IRR 250ML POUR (IV SOLUTION) ×3 IMPLANT
WIRE CRE 18-20MM 8CM F G (MISCELLANEOUS) IMPLANT

## 2016-06-01 NOTE — H&P (Signed)
Elizabeth Lame, MD [address] [place], [place] [address] Phone:[phone] Fax : [phone]  Primary Care Physician:  Kirk Ruths., MD Primary Gastroenterologist:  Dr. Allen Norris  Pre-Procedure History & Physical: HPI:  Elizabeth Rivera is a 66 y.o. female is here for an endoscopy.   Past Medical History:  Diagnosis Date  . Anemia   . Anemia of other chronic disease 09/01/2013  . Anxiety   . Arthritis    back s/p fracture x3 approx 1983  . Candidal vaginitis   . Diabetes mellitus without complication ([place])   . Diverticulosis of colon (without mention of hemorrhage) 05/20/2009   Internal hemms (Dr. Vira Agar)  . Forearm fracture    2013, left  . GERD (gastroesophageal reflux disease)    reflux HH 09/01/2002//egd reflux Esoph. HH Gastritis nonbleeding erosive gastropathy Duodentitis 08/15/2006  . Hyperglycemia   . Hyperlipidemia   . Hypertension   . Insomnia, unspecified   . Irritable bowel syndrome    history of  . Nervous breakdown 08/2002   [facility]  . Neuropathy   . Other abnormal blood chemistry   . Other chest pain    chest discomfort  . Other esophagitis    erosive esophagitis  . Other specified disorders of adrenal glands 07/2007   adrenal mass via of CT 1.4cm left Adrenal no change(Dr.Cope)   . Other specified gastritis without mention of hemorrhage    erosvie gastritis  . Stroke ([place]) 03/2014  . Stroke ([place]) 03/2015  . Thyroid disease    hypothyroid, goiter  . Wears dentures    partial upper and lower.    Past Surgical History:  Procedure Laterality Date  . ABDOMINAL HYSTERECTOMY  1995   Part Hyst BSO DUB ovarian cyst B9  . BREAST CYST ASPIRATION Bilateral   . BREAST REDUCTION SURGERY  1984  . CHOLECYSTECTOMY  03/2004  . COLONOSCOPY  1. 08/15/06  2. 05/20/09   Int Hemms  2. Divertics Int Hemms (Dr. Vira Agar)  . CT ABD W & PELVIS WO CM  6/09   CT Scan abd stable adrenal adenoma 1.4cm left adrenal no change (Dr. Jacqlyn Larsen)  . CYSTOSCOPY   01/08/2008   Former site of inflammation healed (Dr. Jacqlyn Larsen)  . ESOPHAGOGASTRODUODENOSCOPY  1. 09/01/02  2. 08/15/06   1. Reflux, HH  2. Reflux esoph HH gaastritis nonbleeding erosive gastropathy duodenitis  . NSVD x1  1977  . REDUCTION MAMMAPLASTY  1985  . VAGINAL DELIVERY  1977    Prior to Admission medications   Medication Sig Start Date End Date Taking? Authorizing Provider  ALPRAZolam (XANAX) 0.25 MG tablet Take 1 tablet (0.25 mg total) by mouth 2 (two) times daily. 03/15/15  Yes Bettey Costa, MD  atorvastatin (LIPITOR) 40 MG tablet Take 1 tablet (40 mg total) by mouth daily at 6 PM. 04/21/15  Yes Tonia Ghent, MD  bisacodyl (DULCOLAX) 10 MG suppository Place 10 mg rectally as needed for moderate constipation.   Yes Historical Provider, MD  cholecalciferol (VITAMIN D) 1000 units tablet Take 1,000 Units by mouth daily.   Yes Historical Provider, MD  cloNIDine (CATAPRES) 0.1 MG tablet Take by mouth. 03/16/16  Yes Historical Provider, MD  clopidogrel (PLAVIX) 75 MG tablet Take 1 tablet (75 mg total) by mouth daily. 04/22/15  Yes Tonia Ghent, MD  CRANBERRY PO Take by mouth daily.   Yes Historical Provider, MD  Dexlansoprazole (DEXILANT PO) Take by mouth daily.   Yes Historical Provider, MD  ferrous sulfate (CVS IRON)  325 (65 FE) MG tablet Take 1 tablet (325 mg total) by mouth daily with breakfast. 04/08/15  Yes Tonia Ghent, MD  gabapentin (NEURONTIN) 100 MG capsule Take 200 mg by mouth 3 (three) times daily.    Yes Historical Provider, MD  hydrochlorothiazide (HYDRODIURIL) 25 MG tablet Take 1 tablet (25 mg total) by mouth daily. 04/22/15  Yes Tonia Ghent, MD  Insulin Glargine (LANTUS SOLOSTAR) 100 UNIT/ML Solostar Pen Inject into the skin. 03/16/16 03/16/17 Yes Historical Provider, MD  levothyroxine (SYNTHROID, LEVOTHROID) 112 MCG tablet Take by mouth. 02/25/16  Yes Historical Provider, MD  Linaclotide (LINZESS PO) Take by mouth daily.   Yes Historical Provider, MD  lisinopril (PRINIVIL,ZESTRIL)  40 MG tablet Take 1 tablet (40 mg total) by mouth daily. Patient taking differently: Take 2.5 mg by mouth daily.  04/27/15  Yes Tonia Ghent, MD  Melatonin 5 MG TABS Take 5 mg by mouth at bedtime.   Yes Historical Provider, MD  metFORMIN (GLUCOPHAGE) 1000 MG tablet Take 0.5 tablets (500 mg total) by mouth 2 (two) times daily with a meal. 02/09/15  Yes Tonia Ghent, MD  metoprolol succinate (TOPROL-XL) 100 MG 24 hr tablet Take 1 tablet (100 mg total) by mouth daily. Take with or immediately following a meal. 06/23/15  Yes Tonia Ghent, MD  mirabegron ER (MYRBETRIQ) 25 MG TB24 tablet Take 1 tablet (25 mg total) by mouth daily. 09/02/15  Yes Nickie Retort, MD  mirtazapine (REMERON) 7.5 MG tablet Take by mouth. 04/20/16 04/20/17 Yes Historical Provider, MD  ranitidine (ZANTAC) 300 MG tablet Take 1 tablet (300 mg total) by mouth daily. 01/08/15  Yes Tonia Ghent, MD  venlafaxine XR (EFFEXOR-XR) 150 MG 24 hr capsule Take 1 capsule (150 mg total) by mouth 2 (two) times daily. 01/08/15  Yes Tonia Ghent, MD  vitamin B-12 (CYANOCOBALAMIN) 1000 MCG tablet Take 1,000 mcg by mouth daily.    Yes Historical Provider, MD  Blood Glucose Monitoring Suppl (FIFTY50 GLUCOSE METER 2.0) w/Device KIT Use as directed. TRUE METRIX AIR E11.40 03/20/16   Historical Provider, MD  insulin glargine (LANTUS) 100 UNIT/ML injection Inject 9 Units into the skin at bedtime.    Historical Provider, MD  Insulin Pen Needle (FIFTY50 PEN NEEDLES) 31G X 5 MM MISC Use as directed. E11.40 03/16/16 03/16/17  Historical Provider, MD  Lancets MISC Use 1 each 3 (three) times daily. Use as instructed. Dx E11.40  TRUEPLUS  28G 03/20/16 03/20/17  Historical Provider, MD  pantoprazole (PROTONIX) 40 MG tablet Take 1 tablet (40 mg total) by mouth 2 (two) times daily. Patient not taking: Reported on 05/25/2016 04/25/15   Tonia Ghent, MD  ULTICARE ALCOHOL SWABS PADS Apply 1 each topically as directed. E11.40 03/16/16   Historical Provider, MD     Allergies as of 05/23/2016 - Review Complete 05/23/2016  Allergen Reaction Noted  . Sulfa antibiotics Rash 01/17/2013    Family History  Problem Relation Age of Onset  . Hypertension Mother   . Hyperlipidemia Mother   . Stroke Mother     48  . Vision loss Father     vision problems  . Cancer Father     Colon   . Hyperlipidemia Sister   . Hypertension Sister   . Vision loss Sister   . Breast cancer Maternal Grandmother 66  . Breast cancer Cousin     paternal cousin  . Prostate cancer Paternal Grandfather   . Bladder Cancer Neg Hx   .  Kidney cancer Neg Hx     Social History   Social History  . Marital status: Married    Spouse name: N/A  . Number of children: N/A  . Years of education: N/A   Occupational History  . Not on file.   Social History Main Topics  . Smoking status: Never Smoker  . Smokeless tobacco: Never Used  . Alcohol use No  . Drug use: No  . Sexual activity: No   Other Topics Concern  . Not on file   Social History Narrative   No regular exercise.   Worked at [employer] in [place]   Her elderly mother had a CVA and moved in with patient    Review of Systems: See HPI, otherwise negative ROS  Physical Exam: BP (!) 177/88   Pulse 68   Temp 97 F (36.1 C) (Temporal)   Ht 5' 3"  (1.6 m)   Wt 128 lb (58.1 kg)   SpO2 100%   BMI 22.67 kg/m  General:   Alert,  pleasant and cooperative in NAD Head:  Normocephalic and atraumatic. Neck:  Supple; no masses or thyromegaly. Lungs:  Clear throughout to auscultation.    Heart:  Regular rate and rhythm. Abdomen:  Soft, nontender and nondistended. Normal bowel sounds, without guarding, and without rebound.   Neurologic:  Alert and  oriented x4;  grossly normal neurologically.  Impression/Plan: HAROLD MATTES is here for an endoscopy to be performed for Dysphagia  Risks, benefits, limitations, and alternatives regarding  endoscopy have been reviewed with the patient.  Questions have been  answered.  All parties agreeable.   Elizabeth Lame, MD  06/01/2016, 7:46 AM

## 2016-06-01 NOTE — Op Note (Signed)
[facility] Gastroenterology Patient Name: Elizabeth Rivera Procedure Date: 06/01/2016 7:59 AM MRN: [id number] Account #: [id number] Date of Birth: 1950/07/06 Admit Type: Outpatient Age: 66 Room: [facility] OR ROOM 01 Gender: Female Note Status: Finalized Procedure:            Upper GI endoscopy Indications:          Dysphagia Providers:            Lucilla Lame MD, MD Referring MD:         Ocie Cornfield. Ouida Sills MD, MD (Referring MD) Medicines:            Propofol per Anesthesia Complications:        No immediate complications. Procedure:            Pre-Anesthesia Assessment:                       - Prior to the procedure, a History and Physical was                        performed, and patient medications and allergies were                        reviewed. The patient's tolerance of previous                        anesthesia was also reviewed. The risks and benefits of                        the procedure and the sedation options and risks were                        discussed with the patient. All questions were                        answered, and informed consent was obtained. Prior                        Anticoagulants: The patient has taken Plavix                        (clopidogrel), last dose was 5 days prior to procedure.                        ASA Grade Assessment: II - A patient with mild systemic                        disease. After reviewing the risks and benefits, the                        patient was deemed in satisfactory condition to undergo                        the procedure.                       After obtaining informed consent, the endoscope was                        passed under direct vision. Throughout the procedure,  the patient's blood pressure, pulse, and oxygen                        saturations were monitored continuously. The Olympus                        GIF H180J Endoscope (S#: [id number]) was introduced          through the mouth, and advanced to the second part of                        duodenum. The upper GI endoscopy was accomplished                        without difficulty. The patient tolerated the procedure                        well. Findings:      The examined esophagus was normal.      The stomach was normal.      The examined duodenum was normal.      The scope was withdrawn. Dilation was performed in the entire esophagus       with a Maloney dilator with no resistance at 52 Fr. Impression:           - Normal esophagus.                       - Normal stomach.                       - Normal examined duodenum.                       - Dilation performed in the entire esophagus.                       - No specimens collected. Recommendation:       - Discharge patient to home.                       - Resume previous diet.                       - Continue present medications. Procedure Code(s):    --- Professional ---                       [phone], Esophagogastroduodenoscopy, flexible, transoral;                        diagnostic, including collection of specimen(s) by                        brushing or washing, when performed (separate procedure)                       43450, Dilation of esophagus, by unguided sound or                        bougie, single or multiple passes Diagnosis Code(s):    --- Professional ---                       R13.10, Dysphagia, unspecified CPT  copyright 2016 American Medical Association. All rights reserved. The codes documented in this report are preliminary and upon coder review may  be revised to meet current compliance requirements. Lucilla Lame MD, MD 06/01/2016 8:13:16 AM This report has been signed electronically. Number of Addenda: 0 Note Initiated On: 06/01/2016 7:59 AM      [facility]

## 2016-06-01 NOTE — Anesthesia Procedure Notes (Signed)
Procedure Name: MAC Date/Time: 06/01/2016 8:00 AM Performed by: Cameron Ali Pre-anesthesia Checklist: Patient identified, Emergency Drugs available, Suction available, Timeout performed and Patient being monitored Patient Re-evaluated:Patient Re-evaluated prior to inductionOxygen Delivery Method: Nasal cannula Placement Confirmation: positive ETCO2

## 2016-06-01 NOTE — Anesthesia Postprocedure Evaluation (Addendum)
Anesthesia Post Note  Patient: KIMAYA WHITLATCH  Procedure(s) Performed: Procedure(s) (LRB): ESOPHAGOGASTRODUODENOSCOPY (EGD) WITH PROPOFOL (N/A)  Patient location during evaluation: PACU Anesthesia Type: MAC Level of consciousness: awake and alert and oriented Pain management: satisfactory to patient Vital Signs Assessment: post-procedure vital signs reviewed and stable Respiratory status: spontaneous breathing, nonlabored ventilation and respiratory function stable Cardiovascular status: blood pressure returned to baseline and stable Postop Assessment: Adequate PO intake and No signs of nausea or vomiting Anesthetic complications: no    Raliegh Ip

## 2016-06-01 NOTE — Transfer of Care (Signed)
Immediate Anesthesia Transfer of Care Note  Patient: Elizabeth Rivera  Procedure(s) Performed: Procedure(s) with comments: ESOPHAGOGASTRODUODENOSCOPY (EGD) WITH PROPOFOL (N/A) - diabetic - insulin and oral meds  Patient Location: PACU  Anesthesia Type: MAC  Level of Consciousness: awake, alert  and patient cooperative  Airway and Oxygen Therapy: Patient Spontanous Breathing and Patient connected to supplemental oxygen  Post-op Assessment: Post-op Vital signs reviewed, Patient's Cardiovascular Status Stable, Respiratory Function Stable, Patent Airway and No signs of Nausea or vomiting  Post-op Vital Signs: Reviewed and stable  Complications: No apparent anesthesia complications

## 2016-06-01 NOTE — Anesthesia Preprocedure Evaluation (Signed)
Anesthesia Evaluation  Patient identified by MRN, date of birth, ID band Patient awake    Reviewed: Allergy & Precautions, H&P , NPO status , Patient's Chart, lab work & pertinent test results  Airway Mallampati: III  TM Distance: >3 FB Neck ROM: full  Mouth opening: Limited Mouth Opening  Dental no notable dental hx.    Pulmonary    Pulmonary exam normal        Cardiovascular hypertension, Normal cardiovascular exam     Neuro/Psych PSYCHIATRIC DISORDERS  Neuromuscular disease CVA    GI/Hepatic GERD  ,  Endo/Other  diabetesHypothyroidism   Renal/GU      Musculoskeletal   Abdominal   Peds  Hematology   Anesthesia Other Findings   Reproductive/Obstetrics                             Anesthesia Physical Anesthesia Plan  ASA: III  Anesthesia Plan: MAC   Post-op Pain Management:    Induction:   Airway Management Planned:   Additional Equipment:   Intra-op Plan:   Post-operative Plan:   Informed Consent: I have reviewed the patients History and Physical, chart, labs and discussed the procedure including the risks, benefits and alternatives for the proposed anesthesia with the patient or authorized representative who has indicated his/her understanding and acceptance.     Plan Discussed with:   Anesthesia Plan Comments:         Anesthesia Quick Evaluation

## 2016-06-02 ENCOUNTER — Encounter: Payer: Self-pay | Admitting: Gastroenterology

## 2016-06-06 DIAGNOSIS — E119 Type 2 diabetes mellitus without complications: Secondary | ICD-10-CM | POA: Diagnosis not present

## 2016-07-10 ENCOUNTER — Telehealth: Payer: Self-pay | Admitting: Gastroenterology

## 2016-07-10 NOTE — Telephone Encounter (Signed)
Patient has called billing several times (very frustrated) for not properly attaching facility to claim. Called today 07/10/16 and spoke with Ysidro Evert and he will send claim to review. Told patient to call me in a couple of weeks if she is still no where with this billing issue.

## 2016-07-13 ENCOUNTER — Ambulatory Visit (INDEPENDENT_AMBULATORY_CARE_PROVIDER_SITE_OTHER): Payer: Medicare HMO | Admitting: Urology

## 2016-07-13 ENCOUNTER — Encounter: Payer: Self-pay | Admitting: Urology

## 2016-07-13 DIAGNOSIS — N3281 Overactive bladder: Secondary | ICD-10-CM | POA: Diagnosis not present

## 2016-07-13 DIAGNOSIS — N3941 Urge incontinence: Secondary | ICD-10-CM

## 2016-07-13 LAB — URINALYSIS, COMPLETE
BILIRUBIN UA: NEGATIVE
KETONES UA: NEGATIVE
Leukocytes, UA: NEGATIVE
NITRITE UA: NEGATIVE
Protein, UA: NEGATIVE
SPEC GRAV UA: 1.015 (ref 1.005–1.030)
Urobilinogen, Ur: 0.2 mg/dL (ref 0.2–1.0)
pH, UA: 6 (ref 5.0–7.5)

## 2016-07-13 LAB — MICROSCOPIC EXAMINATION
Bacteria, UA: NONE SEEN
RBC MICROSCOPIC, UA: NONE SEEN /HPF (ref 0–?)

## 2016-07-13 LAB — BLADDER SCAN AMB NON-IMAGING: Scan Result: 0

## 2016-07-13 NOTE — Progress Notes (Signed)
07/13/2016 11:52 AM   Elizabeth Rivera [birth year] [id number]  Referring provider: Kirk Ruths, MD [facility] [place], [place] [address]  Chief Complaint  Patient presents with  . Urinary Incontinence    HPI: The patient is a 66 year old female with a history of stroke in February 2017, urinary retention and UTI who presents today for urinary frequency and urgency. She presents today with worsening of her urge and urge incontinence. She denies any leakage with coughing or sneezing. She has such severe urgency with incontinence that she has to wear depends. She often will still clear depends at night. She finds this very bothersome. She was recently treated for urinary tract infection. She has no dysuria or symptoms of a urinary tract infection at this time. She does have a history of urinary retention of approximately 200-250 cc. Her PVR was however 0. So she was started on Myrbetriq. She had a history of stroke x2 so we did not want to start her on an anticholinergic due to her baseline mental status.  She is very happy with the Myrbetriq. Her symptoms have completely resolved. She has no urgency or incontinence. Her PVR is 80 cc. She is having difficulty affording her medication.  Interval: The patient presents today for follow-up of her overactive bladder. Prior to starting on myrbetriq she was getting up 4 times at night and having urinary frequency less than once every hour. She did not have any incontinence. She continues to only get up once per night. She has to urinate once every 2 hours or so. She only has mild urgency. She has no urge incontinence. She is very happy with her improvement on these medications.   PMH: Past Medical History:  Diagnosis Date  . Anemia   . Anemia of other chronic disease 09/01/2013  . Anxiety   . Arthritis    back s/p fracture x3 approx 1983  . Candidal vaginitis   . Diabetes mellitus without complication  ([place])   . Diverticulosis of colon (without mention of hemorrhage) 05/20/2009   Internal hemms (Dr. Vira Agar)  . Forearm fracture    2013, left  . GERD (gastroesophageal reflux disease)    reflux HH 09/01/2002//egd reflux Esoph. HH Gastritis nonbleeding erosive gastropathy Duodentitis 08/15/2006  . Hyperglycemia   . Hyperlipidemia   . Hypertension   . Insomnia, unspecified   . Irritable bowel syndrome    history of  . Nervous breakdown 08/2002   [facility]  . Neuropathy   . Other abnormal blood chemistry   . Other chest pain    chest discomfort  . Other esophagitis    erosive esophagitis  . Other specified disorders of adrenal glands 07/2007   adrenal mass via of CT 1.4cm left Adrenal no change(Dr.Cope)   . Other specified gastritis without mention of hemorrhage    erosvie gastritis  . Stroke ([place]) 03/2014  . Stroke ([place]) 03/2015  . Thyroid disease    hypothyroid, goiter  . Wears dentures    partial upper and lower.    Surgical History: Past Surgical History:  Procedure Laterality Date  . ABDOMINAL HYSTERECTOMY  1995   Part Hyst BSO DUB ovarian cyst B9  . BREAST CYST ASPIRATION Bilateral   . BREAST REDUCTION SURGERY  1984  . CHOLECYSTECTOMY  03/2004  . COLONOSCOPY  1. 08/15/06  2. 05/20/09   Int Hemms  2. Divertics Int Hemms (Dr. Vira Agar)  . CT ABD W & PELVIS WO CM  6/09   CT Scan abd stable adrenal adenoma 1.4cm left adrenal no change (Dr. Jacqlyn Larsen)  . CYSTOSCOPY  01/08/2008   Former site of inflammation healed (Dr. Jacqlyn Larsen)  . ESOPHAGOGASTRODUODENOSCOPY  1. 09/01/02  2. 08/15/06   1. Reflux, HH  2. Reflux esoph HH gaastritis nonbleeding erosive gastropathy duodenitis  . ESOPHAGOGASTRODUODENOSCOPY (EGD) WITH PROPOFOL N/A 06/01/2016   Procedure: ESOPHAGOGASTRODUODENOSCOPY (EGD) WITH PROPOFOL;  Surgeon: Lucilla Lame, MD;  Location: [place];  Service: Endoscopy;  Laterality: N/A;  diabetic - insulin and oral meds  . NSVD x1  1977  . REDUCTION MAMMAPLASTY  1985  .  VAGINAL DELIVERY  1977    Home Medications:  Allergies as of 07/13/2016      Reactions   Sulfa Antibiotics Rash   And angioedmea      Medication List       Accurate as of 07/13/16 11:52 AM. Always use your most recent med list.          ALPRAZolam 0.25 MG tablet Commonly known as:  XANAX Take 1 tablet (0.25 mg total) by mouth 2 (two) times daily.   atorvastatin 40 MG tablet Commonly known as:  LIPITOR Take 1 tablet (40 mg total) by mouth daily at 6 PM.   cholecalciferol 1000 units tablet Commonly known as:  VITAMIN D Take 1,000 Units by mouth daily.   cloNIDine 0.1 MG tablet Commonly known as:  CATAPRES Take by mouth.   clopidogrel 75 MG tablet Commonly known as:  PLAVIX Take 1 tablet (75 mg total) by mouth daily.   CRANBERRY PO Take by mouth daily.   DEXILANT PO Take by mouth daily.   DULCOLAX 10 MG suppository Generic drug:  bisacodyl Place 10 mg rectally as needed for moderate constipation.   ferrous sulfate 325 (65 FE) MG tablet Commonly known as:  [employer] IRON Take 1 tablet (325 mg total) by mouth daily with breakfast.   FIFTY50 GLUCOSE METER 2.0 w/Device Kit Use as directed. TRUE METRIX AIR E11.40   FIFTY50 PEN NEEDLES 31G X 5 MM Misc Generic drug:  Insulin Pen Needle Use as directed. E11.40   gabapentin 100 MG capsule Commonly known as:  NEURONTIN Take 200 mg by mouth 3 (three) times daily.   hydrochlorothiazide 25 MG tablet Commonly known as:  HYDRODIURIL Take 1 tablet (25 mg total) by mouth daily.   insulin glargine 100 UNIT/ML injection Commonly known as:  LANTUS Inject 9 Units into the skin at bedtime.   LANTUS SOLOSTAR 100 UNIT/ML Solostar Pen Generic drug:  Insulin Glargine Inject into the skin.   Lancets Misc Use 1 each 3 (three) times daily. Use as instructed. Dx E11.40  TRUEPLUS  28G   levothyroxine 112 MCG tablet Commonly known as:  SYNTHROID, LEVOTHROID Take by mouth.   LINZESS PO Take by mouth daily.   lisinopril 40 MG  tablet Commonly known as:  PRINIVIL,ZESTRIL Take 1 tablet (40 mg total) by mouth daily.   Melatonin 5 MG Tabs Take 5 mg by mouth at bedtime.   metFORMIN 1000 MG tablet Commonly known as:  GLUCOPHAGE Take 0.5 tablets (500 mg total) by mouth 2 (two) times daily with a meal.   metoprolol succinate 100 MG 24 hr tablet Commonly known as:  TOPROL-XL Take 1 tablet (100 mg total) by mouth daily. Take with or immediately following a meal.   mirabegron ER 25 MG Tb24 tablet Commonly known as:  MYRBETRIQ Take 1 tablet (25 mg total) by mouth daily.   mirtazapine 7.5 MG  tablet Commonly known as:  REMERON Take by mouth.   pantoprazole 40 MG tablet Commonly known as:  PROTONIX Take 1 tablet (40 mg total) by mouth 2 (two) times daily.   ranitidine 300 MG tablet Commonly known as:  ZANTAC Take 1 tablet (300 mg total) by mouth daily.   ULTICARE ALCOHOL SWABS Pads Apply 1 each topically as directed. E11.40   venlafaxine XR 150 MG 24 hr capsule Commonly known as:  EFFEXOR-XR Take 1 capsule (150 mg total) by mouth 2 (two) times daily.   vitamin B-12 1000 MCG tablet Commonly known as:  CYANOCOBALAMIN Take 1,000 mcg by mouth daily.       Allergies:  Allergies  Allergen Reactions  . Sulfa Antibiotics Rash    And angioedmea    Family History: Family History  Problem Relation Age of Onset  . Hypertension Mother   . Hyperlipidemia Mother   . Stroke Mother        40  . Vision loss Father        vision problems  . Cancer Father        Colon   . Hyperlipidemia Sister   . Hypertension Sister   . Vision loss Sister   . Breast cancer Maternal Grandmother 26  . Breast cancer Cousin        paternal cousin  . Prostate cancer Paternal Grandfather   . Bladder Cancer Neg Hx   . Kidney cancer Neg Hx     Social History:  reports that she has never smoked. She has never used smokeless tobacco. She reports that she does not drink alcohol or use drugs.  ROS:                                         Physical Exam: There were no vitals taken for this visit.  Constitutional:  Alert and oriented, No acute distress. HEENT: [place] AT, moist mucus membranes.  Trachea midline, no masses. Cardiovascular: No clubbing, cyanosis, or edema. Respiratory: Normal respiratory effort, no increased work of breathing. GI: Abdomen is soft, nontender, nondistended, no abdominal masses GU: No CVA tenderness.  Skin: No rashes, bruises or suspicious lesions. Lymph: No cervical or inguinal adenopathy. Neurologic: Grossly intact, no focal deficits, moving all 4 extremities. Psychiatric: Normal mood and affect.  Laboratory Data: Lab Results  Component Value Date   WBC 6.1 01/24/2016   HGB 10.8 (L) 01/24/2016   HCT 31.9 (L) 01/24/2016   MCV 83.4 01/24/2016   PLT 200 01/24/2016    Lab Results  Component Value Date   CREATININE 1.03 (H) 01/24/2016    No results found for: PSA  No results found for: TESTOSTERONE  Lab Results  Component Value Date   HGBA1C 7.4 (H) 03/12/2015    Urinalysis    Component Value Date/Time   COLORURINE YELLOW (A) 03/11/2015 1227   APPEARANCEUR Cloudy (A) 09/02/2015 1052   LABSPEC 1.010 03/11/2015 1227   LABSPEC 1.010 06/03/2014 1446   PHURINE 7.0 03/11/2015 1227   GLUCOSEU Negative 09/02/2015 1052   GLUCOSEU >=500 06/03/2014 1446   HGBUR NEGATIVE 03/11/2015 1227   BILIRUBINUR Negative 09/02/2015 1052   BILIRUBINUR Negative 06/03/2014 1446   KETONESUR TRACE (A) 03/11/2015 1227   PROTEINUR Negative 09/02/2015 1052   PROTEINUR >500 (A) 03/11/2015 1227   UROBILINOGEN 0.2 05/04/2015 1430   UROBILINOGEN 0.2 04/03/2008 1410   NITRITE Positive (A) 09/02/2015 1052  NITRITE NEGATIVE 03/11/2015 1227   LEUKOCYTESUR 2+ (A) 09/02/2015 1052   LEUKOCYTESUR 1+ 06/03/2014 1446     Assessment & Plan:    1. Overactive bladder The patient has overactive bladder symptoms that are well treated with myrbetriq 25 mg daily. I provided  the patient with another month's supply. Given the efficacy of the medicationand her failure of others, we have opted to provide the medication for the patient on a monthly basis. This was according to a agreement that was worked up with [employer]. We will plan to provide her myrbetriq 25 mg daily x 1 month on a monthly basis. Since her symptoms are controlled so a this point, she needs to follow-up on an annual basis unless problems arise.   No Follow-up on file.  Nickie Retort, MD  [facility] [address] [place], [place] [address] [phone]

## 2016-07-19 DIAGNOSIS — E039 Hypothyroidism, unspecified: Secondary | ICD-10-CM | POA: Diagnosis not present

## 2016-07-19 DIAGNOSIS — E119 Type 2 diabetes mellitus without complications: Secondary | ICD-10-CM | POA: Diagnosis not present

## 2016-07-19 DIAGNOSIS — Z794 Long term (current) use of insulin: Secondary | ICD-10-CM | POA: Diagnosis not present

## 2016-07-25 DIAGNOSIS — Z794 Long term (current) use of insulin: Secondary | ICD-10-CM | POA: Diagnosis not present

## 2016-07-25 DIAGNOSIS — E039 Hypothyroidism, unspecified: Secondary | ICD-10-CM | POA: Diagnosis not present

## 2016-07-25 DIAGNOSIS — E7800 Pure hypercholesterolemia, unspecified: Secondary | ICD-10-CM | POA: Diagnosis not present

## 2016-07-25 DIAGNOSIS — E1140 Type 2 diabetes mellitus with diabetic neuropathy, unspecified: Secondary | ICD-10-CM | POA: Diagnosis not present

## 2016-07-25 DIAGNOSIS — Z0000 Encounter for general adult medical examination without abnormal findings: Secondary | ICD-10-CM | POA: Diagnosis not present

## 2016-07-25 DIAGNOSIS — Z23 Encounter for immunization: Secondary | ICD-10-CM | POA: Diagnosis not present

## 2016-07-25 DIAGNOSIS — F325 Major depressive disorder, single episode, in full remission: Secondary | ICD-10-CM | POA: Diagnosis not present

## 2016-08-23 DIAGNOSIS — I701 Atherosclerosis of renal artery: Secondary | ICD-10-CM | POA: Diagnosis not present

## 2016-08-23 DIAGNOSIS — I2510 Atherosclerotic heart disease of native coronary artery without angina pectoris: Secondary | ICD-10-CM | POA: Diagnosis not present

## 2016-08-23 DIAGNOSIS — K219 Gastro-esophageal reflux disease without esophagitis: Secondary | ICD-10-CM | POA: Diagnosis not present

## 2016-08-23 DIAGNOSIS — I951 Orthostatic hypotension: Secondary | ICD-10-CM | POA: Diagnosis not present

## 2016-08-23 DIAGNOSIS — I10 Essential (primary) hypertension: Secondary | ICD-10-CM | POA: Diagnosis not present

## 2016-08-23 DIAGNOSIS — R55 Syncope and collapse: Secondary | ICD-10-CM | POA: Diagnosis not present

## 2016-08-23 DIAGNOSIS — I639 Cerebral infarction, unspecified: Secondary | ICD-10-CM | POA: Diagnosis not present

## 2016-08-23 DIAGNOSIS — E784 Other hyperlipidemia: Secondary | ICD-10-CM | POA: Diagnosis not present

## 2016-08-23 DIAGNOSIS — I208 Other forms of angina pectoris: Secondary | ICD-10-CM | POA: Diagnosis not present

## 2016-09-04 DIAGNOSIS — E039 Hypothyroidism, unspecified: Secondary | ICD-10-CM | POA: Diagnosis not present

## 2016-09-18 ENCOUNTER — Telehealth: Payer: Self-pay | Admitting: Urology

## 2016-09-18 NOTE — Telephone Encounter (Signed)
Patient called the office requesting samples of 25mg  Myrbetriq.    1 box of 25mg  Myrbetriq was placed up front for the patient to pick up.

## 2016-10-03 DIAGNOSIS — E039 Hypothyroidism, unspecified: Secondary | ICD-10-CM | POA: Diagnosis not present

## 2016-10-23 ENCOUNTER — Telehealth: Payer: Self-pay | Admitting: Urology

## 2016-10-23 NOTE — Telephone Encounter (Signed)
Patient called the office requesting samples of 25mg  Myrbetriq.  I put a box (4 sleeves) up front for her to pick up.

## 2016-10-31 DIAGNOSIS — E119 Type 2 diabetes mellitus without complications: Secondary | ICD-10-CM | POA: Diagnosis not present

## 2016-10-31 DIAGNOSIS — Z794 Long term (current) use of insulin: Secondary | ICD-10-CM | POA: Diagnosis not present

## 2016-10-31 DIAGNOSIS — E039 Hypothyroidism, unspecified: Secondary | ICD-10-CM | POA: Diagnosis not present

## 2016-11-07 DIAGNOSIS — E785 Hyperlipidemia, unspecified: Secondary | ICD-10-CM | POA: Diagnosis not present

## 2016-11-07 DIAGNOSIS — L84 Corns and callosities: Secondary | ICD-10-CM | POA: Diagnosis not present

## 2016-11-07 DIAGNOSIS — E1142 Type 2 diabetes mellitus with diabetic polyneuropathy: Secondary | ICD-10-CM | POA: Diagnosis not present

## 2016-11-07 DIAGNOSIS — E1140 Type 2 diabetes mellitus with diabetic neuropathy, unspecified: Secondary | ICD-10-CM | POA: Diagnosis not present

## 2016-11-07 DIAGNOSIS — E1169 Type 2 diabetes mellitus with other specified complication: Secondary | ICD-10-CM | POA: Diagnosis not present

## 2016-11-07 DIAGNOSIS — Z794 Long term (current) use of insulin: Secondary | ICD-10-CM | POA: Diagnosis not present

## 2016-11-07 DIAGNOSIS — E039 Hypothyroidism, unspecified: Secondary | ICD-10-CM | POA: Diagnosis not present

## 2016-11-16 ENCOUNTER — Telehealth: Payer: Self-pay | Admitting: Urology

## 2016-11-16 DIAGNOSIS — N3281 Overactive bladder: Secondary | ICD-10-CM

## 2016-11-16 NOTE — Telephone Encounter (Signed)
Left 1 month of 25mg  Myrbetriq up front.    You were going to send script to pharmacy for her

## 2016-11-17 MED ORDER — MIRABEGRON ER 25 MG PO TB24: 25.0000 mg | ORAL_TABLET | Freq: Every day | ORAL | 1 refills | Status: DC

## 2016-11-17 NOTE — Telephone Encounter (Signed)
Medication sent to pharmacy for PA to be obtained.

## 2017-01-19 DIAGNOSIS — E7800 Pure hypercholesterolemia, unspecified: Secondary | ICD-10-CM | POA: Diagnosis not present

## 2017-01-19 DIAGNOSIS — E1140 Type 2 diabetes mellitus with diabetic neuropathy, unspecified: Secondary | ICD-10-CM | POA: Diagnosis not present

## 2017-01-19 DIAGNOSIS — Z794 Long term (current) use of insulin: Secondary | ICD-10-CM | POA: Diagnosis not present

## 2017-01-22 ENCOUNTER — Telehealth: Payer: Self-pay | Admitting: Gastroenterology

## 2017-01-22 NOTE — Telephone Encounter (Signed)
Patient called and wanted to know if you had any samples of Linzess she could have? Please call

## 2017-01-23 NOTE — Telephone Encounter (Signed)
Left vm letting pt know we do have samples of Linzess available. She can stop by the [place] office to get.

## 2017-01-24 DIAGNOSIS — E039 Hypothyroidism, unspecified: Secondary | ICD-10-CM | POA: Diagnosis not present

## 2017-01-24 DIAGNOSIS — E1142 Type 2 diabetes mellitus with diabetic polyneuropathy: Secondary | ICD-10-CM | POA: Diagnosis not present

## 2017-01-24 DIAGNOSIS — F325 Major depressive disorder, single episode, in full remission: Secondary | ICD-10-CM | POA: Diagnosis not present

## 2017-01-24 DIAGNOSIS — E785 Hyperlipidemia, unspecified: Secondary | ICD-10-CM | POA: Diagnosis not present

## 2017-01-24 DIAGNOSIS — E1169 Type 2 diabetes mellitus with other specified complication: Secondary | ICD-10-CM | POA: Diagnosis not present

## 2017-01-24 NOTE — Telephone Encounter (Signed)
Spoke with pt and advised her we do have the Linzess samples available. Confirmed dosage with pt and she is taking 138mcg. Pt's husband will come by later today to pick up.

## 2017-02-15 DIAGNOSIS — E1159 Type 2 diabetes mellitus with other circulatory complications: Secondary | ICD-10-CM | POA: Diagnosis not present

## 2017-02-15 DIAGNOSIS — E785 Hyperlipidemia, unspecified: Secondary | ICD-10-CM | POA: Diagnosis not present

## 2017-02-15 DIAGNOSIS — E1169 Type 2 diabetes mellitus with other specified complication: Secondary | ICD-10-CM | POA: Diagnosis not present

## 2017-02-15 DIAGNOSIS — I10 Essential (primary) hypertension: Secondary | ICD-10-CM | POA: Diagnosis not present

## 2017-02-15 DIAGNOSIS — E1142 Type 2 diabetes mellitus with diabetic polyneuropathy: Secondary | ICD-10-CM | POA: Diagnosis not present

## 2017-02-15 DIAGNOSIS — E039 Hypothyroidism, unspecified: Secondary | ICD-10-CM | POA: Diagnosis not present

## 2017-02-28 DIAGNOSIS — I10 Essential (primary) hypertension: Secondary | ICD-10-CM | POA: Diagnosis not present

## 2017-02-28 DIAGNOSIS — I2510 Atherosclerotic heart disease of native coronary artery without angina pectoris: Secondary | ICD-10-CM | POA: Diagnosis not present

## 2017-02-28 DIAGNOSIS — I208 Other forms of angina pectoris: Secondary | ICD-10-CM | POA: Diagnosis not present

## 2017-02-28 DIAGNOSIS — I701 Atherosclerosis of renal artery: Secondary | ICD-10-CM | POA: Diagnosis not present

## 2017-02-28 DIAGNOSIS — E7849 Other hyperlipidemia: Secondary | ICD-10-CM | POA: Diagnosis not present

## 2017-02-28 DIAGNOSIS — I639 Cerebral infarction, unspecified: Secondary | ICD-10-CM | POA: Diagnosis not present

## 2017-02-28 DIAGNOSIS — K219 Gastro-esophageal reflux disease without esophagitis: Secondary | ICD-10-CM | POA: Diagnosis not present

## 2017-02-28 DIAGNOSIS — I951 Orthostatic hypotension: Secondary | ICD-10-CM | POA: Diagnosis not present

## 2017-02-28 DIAGNOSIS — R55 Syncope and collapse: Secondary | ICD-10-CM | POA: Diagnosis not present

## 2017-03-20 ENCOUNTER — Telehealth: Payer: Self-pay | Admitting: Urology

## 2017-03-20 DIAGNOSIS — N3281 Overactive bladder: Secondary | ICD-10-CM

## 2017-03-20 MED ORDER — MIRABEGRON ER 25 MG PO TB24: 25.0000 mg | ORAL_TABLET | Freq: Every day | ORAL | 3 refills | Status: DC

## 2017-03-20 NOTE — Telephone Encounter (Signed)
Script sent in

## 2017-03-20 NOTE — Telephone Encounter (Signed)
Patient is calling and asking for a 90 day supply of myrbetriq called in to her [place] mail order pharmacy. I updated her pharmacies and I hope this is the correct on for [facility].   Sharyn Lull

## 2017-03-22 ENCOUNTER — Other Ambulatory Visit: Payer: Self-pay | Admitting: Internal Medicine

## 2017-03-22 DIAGNOSIS — Z1231 Encounter for screening mammogram for malignant neoplasm of breast: Secondary | ICD-10-CM

## 2017-04-10 DIAGNOSIS — D2261 Melanocytic nevi of right upper limb, including shoulder: Secondary | ICD-10-CM | POA: Diagnosis not present

## 2017-04-10 DIAGNOSIS — L821 Other seborrheic keratosis: Secondary | ICD-10-CM | POA: Diagnosis not present

## 2017-04-10 DIAGNOSIS — D2272 Melanocytic nevi of left lower limb, including hip: Secondary | ICD-10-CM | POA: Diagnosis not present

## 2017-04-10 DIAGNOSIS — D225 Melanocytic nevi of trunk: Secondary | ICD-10-CM | POA: Diagnosis not present

## 2017-05-03 ENCOUNTER — Ambulatory Visit
Admission: RE | Admit: 2017-05-03 | Discharge: 2017-05-03 | Disposition: A | Discharge status: Home / Self Care | Payer: Medicare HMO | Source: Ambulatory Visit | Attending: Internal Medicine | Admitting: Internal Medicine

## 2017-05-03 DIAGNOSIS — Z1231 Encounter for screening mammogram for malignant neoplasm of breast: Secondary | ICD-10-CM

## 2017-05-07 DIAGNOSIS — E119 Type 2 diabetes mellitus without complications: Secondary | ICD-10-CM | POA: Diagnosis not present

## 2017-05-10 DIAGNOSIS — E1159 Type 2 diabetes mellitus with other circulatory complications: Secondary | ICD-10-CM | POA: Diagnosis not present

## 2017-05-10 DIAGNOSIS — E1142 Type 2 diabetes mellitus with diabetic polyneuropathy: Secondary | ICD-10-CM | POA: Diagnosis not present

## 2017-05-10 DIAGNOSIS — E1169 Type 2 diabetes mellitus with other specified complication: Secondary | ICD-10-CM | POA: Diagnosis not present

## 2017-05-10 DIAGNOSIS — E039 Hypothyroidism, unspecified: Secondary | ICD-10-CM | POA: Diagnosis not present

## 2017-05-10 DIAGNOSIS — E785 Hyperlipidemia, unspecified: Secondary | ICD-10-CM | POA: Diagnosis not present

## 2017-05-10 DIAGNOSIS — I10 Essential (primary) hypertension: Secondary | ICD-10-CM | POA: Diagnosis not present

## 2017-05-17 DIAGNOSIS — E785 Hyperlipidemia, unspecified: Secondary | ICD-10-CM | POA: Diagnosis not present

## 2017-05-17 DIAGNOSIS — I10 Essential (primary) hypertension: Secondary | ICD-10-CM | POA: Diagnosis not present

## 2017-05-17 DIAGNOSIS — E039 Hypothyroidism, unspecified: Secondary | ICD-10-CM | POA: Diagnosis not present

## 2017-05-17 DIAGNOSIS — E1142 Type 2 diabetes mellitus with diabetic polyneuropathy: Secondary | ICD-10-CM | POA: Diagnosis not present

## 2017-05-17 DIAGNOSIS — E1159 Type 2 diabetes mellitus with other circulatory complications: Secondary | ICD-10-CM | POA: Diagnosis not present

## 2017-05-17 DIAGNOSIS — E1169 Type 2 diabetes mellitus with other specified complication: Secondary | ICD-10-CM | POA: Diagnosis not present

## 2017-06-01 DIAGNOSIS — K5792 Diverticulitis of intestine, part unspecified, without perforation or abscess without bleeding: Secondary | ICD-10-CM | POA: Diagnosis not present

## 2017-06-28 DIAGNOSIS — I10 Essential (primary) hypertension: Secondary | ICD-10-CM | POA: Diagnosis not present

## 2017-06-28 DIAGNOSIS — E1142 Type 2 diabetes mellitus with diabetic polyneuropathy: Secondary | ICD-10-CM | POA: Diagnosis not present

## 2017-06-28 DIAGNOSIS — E039 Hypothyroidism, unspecified: Secondary | ICD-10-CM | POA: Diagnosis not present

## 2017-06-28 DIAGNOSIS — E1159 Type 2 diabetes mellitus with other circulatory complications: Secondary | ICD-10-CM | POA: Diagnosis not present

## 2017-06-28 DIAGNOSIS — E1169 Type 2 diabetes mellitus with other specified complication: Secondary | ICD-10-CM | POA: Diagnosis not present

## 2017-06-28 DIAGNOSIS — F325 Major depressive disorder, single episode, in full remission: Secondary | ICD-10-CM | POA: Diagnosis not present

## 2017-06-28 DIAGNOSIS — Z0000 Encounter for general adult medical examination without abnormal findings: Secondary | ICD-10-CM | POA: Diagnosis not present

## 2017-06-28 DIAGNOSIS — E785 Hyperlipidemia, unspecified: Secondary | ICD-10-CM | POA: Diagnosis not present

## 2017-07-19 ENCOUNTER — Ambulatory Visit: Payer: Medicare HMO

## 2017-08-22 DIAGNOSIS — I2510 Atherosclerotic heart disease of native coronary artery without angina pectoris: Secondary | ICD-10-CM | POA: Diagnosis not present

## 2017-08-22 DIAGNOSIS — K219 Gastro-esophageal reflux disease without esophagitis: Secondary | ICD-10-CM | POA: Diagnosis not present

## 2017-08-22 DIAGNOSIS — E7849 Other hyperlipidemia: Secondary | ICD-10-CM | POA: Diagnosis not present

## 2017-08-22 DIAGNOSIS — I951 Orthostatic hypotension: Secondary | ICD-10-CM | POA: Diagnosis not present

## 2017-08-22 DIAGNOSIS — I639 Cerebral infarction, unspecified: Secondary | ICD-10-CM | POA: Diagnosis not present

## 2017-08-22 DIAGNOSIS — R55 Syncope and collapse: Secondary | ICD-10-CM | POA: Diagnosis not present

## 2017-08-22 DIAGNOSIS — I208 Other forms of angina pectoris: Secondary | ICD-10-CM | POA: Diagnosis not present

## 2017-08-22 DIAGNOSIS — I10 Essential (primary) hypertension: Secondary | ICD-10-CM | POA: Diagnosis not present

## 2017-08-22 DIAGNOSIS — I701 Atherosclerosis of renal artery: Secondary | ICD-10-CM | POA: Diagnosis not present

## 2017-08-30 DIAGNOSIS — E1169 Type 2 diabetes mellitus with other specified complication: Secondary | ICD-10-CM | POA: Diagnosis not present

## 2017-08-30 DIAGNOSIS — E1142 Type 2 diabetes mellitus with diabetic polyneuropathy: Secondary | ICD-10-CM | POA: Diagnosis not present

## 2017-08-30 DIAGNOSIS — E039 Hypothyroidism, unspecified: Secondary | ICD-10-CM | POA: Diagnosis not present

## 2017-08-30 DIAGNOSIS — I10 Essential (primary) hypertension: Secondary | ICD-10-CM | POA: Diagnosis not present

## 2017-08-30 DIAGNOSIS — E1159 Type 2 diabetes mellitus with other circulatory complications: Secondary | ICD-10-CM | POA: Diagnosis not present

## 2017-08-30 DIAGNOSIS — E785 Hyperlipidemia, unspecified: Secondary | ICD-10-CM | POA: Diagnosis not present

## 2017-09-19 DIAGNOSIS — R531 Weakness: Secondary | ICD-10-CM | POA: Diagnosis not present

## 2017-09-20 DIAGNOSIS — R531 Weakness: Secondary | ICD-10-CM | POA: Diagnosis not present

## 2017-09-26 ENCOUNTER — Telehealth: Payer: Self-pay | Admitting: Gastroenterology

## 2017-09-26 NOTE — Telephone Encounter (Signed)
Patient needs to know when it's time for her colonoscopy and EGD.

## 2017-09-26 NOTE — Telephone Encounter (Signed)
Left vm informing pt she is due for her repeat EGD and Colonoscopy 08/2018.

## 2017-11-19 DIAGNOSIS — Z23 Encounter for immunization: Secondary | ICD-10-CM | POA: Diagnosis not present

## 2017-11-19 DIAGNOSIS — I10 Essential (primary) hypertension: Secondary | ICD-10-CM | POA: Diagnosis not present

## 2017-11-19 DIAGNOSIS — E1159 Type 2 diabetes mellitus with other circulatory complications: Secondary | ICD-10-CM | POA: Diagnosis not present

## 2017-11-19 DIAGNOSIS — Z9989 Dependence on other enabling machines and devices: Secondary | ICD-10-CM | POA: Insufficient documentation

## 2017-12-05 DIAGNOSIS — E1142 Type 2 diabetes mellitus with diabetic polyneuropathy: Secondary | ICD-10-CM | POA: Diagnosis not present

## 2017-12-05 DIAGNOSIS — E785 Hyperlipidemia, unspecified: Secondary | ICD-10-CM | POA: Diagnosis not present

## 2017-12-05 DIAGNOSIS — E039 Hypothyroidism, unspecified: Secondary | ICD-10-CM | POA: Diagnosis not present

## 2017-12-05 DIAGNOSIS — E1169 Type 2 diabetes mellitus with other specified complication: Secondary | ICD-10-CM | POA: Diagnosis not present

## 2017-12-05 DIAGNOSIS — E1159 Type 2 diabetes mellitus with other circulatory complications: Secondary | ICD-10-CM | POA: Diagnosis not present

## 2017-12-05 DIAGNOSIS — I10 Essential (primary) hypertension: Secondary | ICD-10-CM | POA: Diagnosis not present

## 2017-12-25 DIAGNOSIS — E1169 Type 2 diabetes mellitus with other specified complication: Secondary | ICD-10-CM | POA: Diagnosis not present

## 2017-12-25 DIAGNOSIS — E785 Hyperlipidemia, unspecified: Secondary | ICD-10-CM | POA: Diagnosis not present

## 2017-12-25 DIAGNOSIS — E1142 Type 2 diabetes mellitus with diabetic polyneuropathy: Secondary | ICD-10-CM | POA: Diagnosis not present

## 2017-12-25 DIAGNOSIS — E1159 Type 2 diabetes mellitus with other circulatory complications: Secondary | ICD-10-CM | POA: Diagnosis not present

## 2017-12-25 DIAGNOSIS — E039 Hypothyroidism, unspecified: Secondary | ICD-10-CM | POA: Diagnosis not present

## 2017-12-25 DIAGNOSIS — I10 Essential (primary) hypertension: Secondary | ICD-10-CM | POA: Diagnosis not present

## 2017-12-26 DIAGNOSIS — E1142 Type 2 diabetes mellitus with diabetic polyneuropathy: Secondary | ICD-10-CM | POA: Diagnosis not present

## 2017-12-26 DIAGNOSIS — E785 Hyperlipidemia, unspecified: Secondary | ICD-10-CM | POA: Diagnosis not present

## 2017-12-26 DIAGNOSIS — E1159 Type 2 diabetes mellitus with other circulatory complications: Secondary | ICD-10-CM | POA: Diagnosis not present

## 2017-12-26 DIAGNOSIS — I10 Essential (primary) hypertension: Secondary | ICD-10-CM | POA: Diagnosis not present

## 2017-12-26 DIAGNOSIS — E039 Hypothyroidism, unspecified: Secondary | ICD-10-CM | POA: Diagnosis not present

## 2017-12-26 DIAGNOSIS — E1169 Type 2 diabetes mellitus with other specified complication: Secondary | ICD-10-CM | POA: Diagnosis not present

## 2017-12-31 DIAGNOSIS — I10 Essential (primary) hypertension: Secondary | ICD-10-CM | POA: Diagnosis not present

## 2017-12-31 DIAGNOSIS — E039 Hypothyroidism, unspecified: Secondary | ICD-10-CM | POA: Diagnosis not present

## 2017-12-31 DIAGNOSIS — E1159 Type 2 diabetes mellitus with other circulatory complications: Secondary | ICD-10-CM | POA: Diagnosis not present

## 2017-12-31 DIAGNOSIS — Z9989 Dependence on other enabling machines and devices: Secondary | ICD-10-CM | POA: Diagnosis not present

## 2017-12-31 DIAGNOSIS — F325 Major depressive disorder, single episode, in full remission: Secondary | ICD-10-CM | POA: Diagnosis not present

## 2017-12-31 DIAGNOSIS — E785 Hyperlipidemia, unspecified: Secondary | ICD-10-CM | POA: Diagnosis not present

## 2017-12-31 DIAGNOSIS — E1142 Type 2 diabetes mellitus with diabetic polyneuropathy: Secondary | ICD-10-CM | POA: Diagnosis not present

## 2017-12-31 DIAGNOSIS — E1169 Type 2 diabetes mellitus with other specified complication: Secondary | ICD-10-CM | POA: Diagnosis not present

## 2018-01-03 ENCOUNTER — Encounter: Payer: Self-pay | Admitting: Emergency Medicine

## 2018-01-03 ENCOUNTER — Other Ambulatory Visit: Payer: Self-pay

## 2018-01-03 ENCOUNTER — Emergency Department: Payer: Medicare HMO

## 2018-01-03 ENCOUNTER — Observation Stay
Admission: EM | Admit: 2018-01-03 | Discharge: 2018-01-05 | Disposition: A | Discharge status: Home / Self Care | Payer: Medicare HMO | Attending: Internal Medicine | Admitting: Internal Medicine

## 2018-01-03 DIAGNOSIS — I00 Rheumatic fever without heart involvement: Secondary | ICD-10-CM | POA: Diagnosis not present

## 2018-01-03 DIAGNOSIS — E039 Hypothyroidism, unspecified: Secondary | ICD-10-CM | POA: Insufficient documentation

## 2018-01-03 DIAGNOSIS — Z79890 Hormone replacement therapy (postmenopausal): Secondary | ICD-10-CM | POA: Insufficient documentation

## 2018-01-03 DIAGNOSIS — K219 Gastro-esophageal reflux disease without esophagitis: Secondary | ICD-10-CM | POA: Insufficient documentation

## 2018-01-03 DIAGNOSIS — Z8673 Personal history of transient ischemic attack (TIA), and cerebral infarction without residual deficits: Secondary | ICD-10-CM | POA: Insufficient documentation

## 2018-01-03 DIAGNOSIS — I10 Essential (primary) hypertension: Secondary | ICD-10-CM | POA: Insufficient documentation

## 2018-01-03 DIAGNOSIS — K581 Irritable bowel syndrome with constipation: Secondary | ICD-10-CM | POA: Diagnosis not present

## 2018-01-03 DIAGNOSIS — E860 Dehydration: Secondary | ICD-10-CM

## 2018-01-03 DIAGNOSIS — E1140 Type 2 diabetes mellitus with diabetic neuropathy, unspecified: Secondary | ICD-10-CM | POA: Diagnosis not present

## 2018-01-03 DIAGNOSIS — D638 Anemia in other chronic diseases classified elsewhere: Secondary | ICD-10-CM | POA: Diagnosis not present

## 2018-01-03 DIAGNOSIS — Z7902 Long term (current) use of antithrombotics/antiplatelets: Secondary | ICD-10-CM | POA: Insufficient documentation

## 2018-01-03 DIAGNOSIS — I469 Cardiac arrest, cause unspecified: Secondary | ICD-10-CM | POA: Diagnosis not present

## 2018-01-03 DIAGNOSIS — R946 Abnormal results of thyroid function studies: Secondary | ICD-10-CM | POA: Diagnosis not present

## 2018-01-03 DIAGNOSIS — E785 Hyperlipidemia, unspecified: Secondary | ICD-10-CM | POA: Diagnosis not present

## 2018-01-03 DIAGNOSIS — N179 Acute kidney failure, unspecified: Secondary | ICD-10-CM | POA: Diagnosis not present

## 2018-01-03 DIAGNOSIS — Z794 Long term (current) use of insulin: Secondary | ICD-10-CM | POA: Insufficient documentation

## 2018-01-03 DIAGNOSIS — R55 Syncope and collapse: Secondary | ICD-10-CM | POA: Diagnosis not present

## 2018-01-03 DIAGNOSIS — Z79899 Other long term (current) drug therapy: Secondary | ICD-10-CM | POA: Diagnosis not present

## 2018-01-03 DIAGNOSIS — F419 Anxiety disorder, unspecified: Secondary | ICD-10-CM | POA: Diagnosis not present

## 2018-01-03 DIAGNOSIS — R7989 Other specified abnormal findings of blood chemistry: Secondary | ICD-10-CM

## 2018-01-03 LAB — CBC WITH DIFFERENTIAL/PLATELET
ABS IMMATURE GRANULOCYTES: 0.03 10*3/uL (ref 0.00–0.07)
Basophils Absolute: 0 10*3/uL (ref 0.0–0.1)
Basophils Relative: 1 %
Eosinophils Absolute: 0.3 10*3/uL (ref 0.0–0.5)
Eosinophils Relative: 6 %
HEMATOCRIT: 32 % — AB (ref 36.0–46.0)
Hemoglobin: 10.3 g/dL — ABNORMAL LOW (ref 12.0–15.0)
Immature Granulocytes: 1 %
LYMPHS ABS: 2 10*3/uL (ref 0.7–4.0)
Lymphocytes Relative: 33 %
MCH: 27.8 pg (ref 26.0–34.0)
MCHC: 32.2 g/dL (ref 30.0–36.0)
MCV: 86.3 fL (ref 80.0–100.0)
Monocytes Absolute: 0.4 10*3/uL (ref 0.1–1.0)
Monocytes Relative: 6 %
NEUTROS ABS: 3.3 10*3/uL (ref 1.7–7.7)
NEUTROS PCT: 53 %
PLATELETS: 214 10*3/uL (ref 150–400)
RBC: 3.71 MIL/uL — ABNORMAL LOW (ref 3.87–5.11)
RDW: 13.1 % (ref 11.5–15.5)
WBC: 6.1 10*3/uL (ref 4.0–10.5)
nRBC: 0 % (ref 0.0–0.2)

## 2018-01-03 LAB — PROTIME-INR
INR: 0.93
Prothrombin Time: 12.4 seconds (ref 11.4–15.2)

## 2018-01-03 LAB — URINALYSIS, COMPLETE (UACMP) WITH MICROSCOPIC
BACTERIA UA: NONE SEEN
BILIRUBIN URINE: NEGATIVE
GLUCOSE, UA: NEGATIVE mg/dL
HGB URINE DIPSTICK: NEGATIVE
Ketones, ur: NEGATIVE mg/dL
Leukocytes, UA: NEGATIVE
NITRITE: NEGATIVE
Protein, ur: NEGATIVE mg/dL
SPECIFIC GRAVITY, URINE: 1.008 (ref 1.005–1.030)
Squamous Epithelial / LPF: NONE SEEN (ref 0–5)
pH: 6 (ref 5.0–8.0)

## 2018-01-03 LAB — BASIC METABOLIC PANEL
Anion gap: 9 (ref 5–15)
BUN: 30 mg/dL — AB (ref 8–23)
CO2: 29 mmol/L (ref 22–32)
CREATININE: 1.44 mg/dL — AB (ref 0.44–1.00)
Calcium: 9.6 mg/dL (ref 8.9–10.3)
Chloride: 98 mmol/L (ref 98–111)
GFR calc Af Amer: 44 mL/min — ABNORMAL LOW (ref >60)
GFR, EST NON AFRICAN AMERICAN: 38 mL/min — AB (ref >60)
Glucose, Bld: 125 mg/dL — ABNORMAL HIGH (ref 70–99)
POTASSIUM: 3.5 mmol/L (ref 3.5–5.1)
Sodium: 136 mmol/L (ref 135–145)

## 2018-01-03 LAB — MAGNESIUM: Magnesium: 1.6 mg/dL — ABNORMAL LOW (ref 1.7–2.4)

## 2018-01-03 LAB — TROPONIN I

## 2018-01-03 LAB — AMMONIA: Ammonia: <9 umol/L — ABNORMAL LOW (ref 9–35)

## 2018-01-03 LAB — GLUCOSE, CAPILLARY: Glucose-Capillary: 162 mg/dL — ABNORMAL HIGH (ref 70–99)

## 2018-01-03 LAB — TSH: TSH: 7.927 u[IU]/mL — AB (ref 0.350–4.500)

## 2018-01-03 MED ORDER — INSULIN ASPART 100 UNIT/ML ~~LOC~~ SOLN
0.0000 [IU] | Freq: Three times a day (TID) | SUBCUTANEOUS | Status: DC
  Administered 2018-01-04: 1 [IU] via SUBCUTANEOUS
  Administered 2018-01-04: 3 [IU] via SUBCUTANEOUS
  Administered 2018-01-05 (×2): 2 [IU] via SUBCUTANEOUS
  Filled 2018-01-03 (×4): qty 1

## 2018-01-03 MED ORDER — LINACLOTIDE 145 MCG PO CAPS
145.0000 ug | ORAL_CAPSULE | Freq: Every day | ORAL | Status: DC
  Filled 2018-01-03: qty 1

## 2018-01-03 MED ORDER — ONDANSETRON HCL 4 MG PO TABS: 4.0000 mg | ORAL_TABLET | Freq: Four times a day (QID) | ORAL | Status: DC | PRN

## 2018-01-03 MED ORDER — VITAMIN B-12 1000 MCG PO TABS
1000.0000 ug | ORAL_TABLET | Freq: Every day | ORAL | Status: DC
  Administered 2018-01-03 – 2018-01-05 (×3): 1000 ug via ORAL
  Filled 2018-01-03 (×3): qty 1

## 2018-01-03 MED ORDER — BISACODYL 5 MG PO TBEC: 5.0000 mg | DELAYED_RELEASE_TABLET | Freq: Every day | ORAL | Status: DC | PRN

## 2018-01-03 MED ORDER — ONDANSETRON HCL 4 MG/2ML IJ SOLN: 4.0000 mg | Freq: Four times a day (QID) | INTRAMUSCULAR | Status: DC | PRN

## 2018-01-03 MED ORDER — PANTOPRAZOLE SODIUM 40 MG PO TBEC
40.0000 mg | DELAYED_RELEASE_TABLET | Freq: Every day | ORAL | Status: DC
  Administered 2018-01-03 – 2018-01-05 (×3): 40 mg via ORAL
  Filled 2018-01-03 (×3): qty 1

## 2018-01-03 MED ORDER — VITAMIN D 25 MCG (1000 UNIT) PO TABS
1000.0000 [IU] | ORAL_TABLET | Freq: Every day | ORAL | Status: DC
  Administered 2018-01-03 – 2018-01-05 (×3): 1000 [IU] via ORAL
  Filled 2018-01-03 (×3): qty 1

## 2018-01-03 MED ORDER — SENNOSIDES-DOCUSATE SODIUM 8.6-50 MG PO TABS: 1.0000 | ORAL_TABLET | Freq: Every evening | ORAL | Status: DC | PRN

## 2018-01-03 MED ORDER — ALBUTEROL SULFATE (2.5 MG/3ML) 0.083% IN NEBU: 2.5000 mg | INHALATION_SOLUTION | RESPIRATORY_TRACT | Status: DC | PRN

## 2018-01-03 MED ORDER — HYDROCODONE-ACETAMINOPHEN 5-325 MG PO TABS
1.0000 | ORAL_TABLET | ORAL | Status: DC | PRN
  Administered 2018-01-04: 1 via ORAL
  Administered 2018-01-04: 2 via ORAL
  Filled 2018-01-03: qty 2
  Filled 2018-01-03: qty 1

## 2018-01-03 MED ORDER — MIRTAZAPINE 15 MG PO TABS
7.5000 mg | ORAL_TABLET | Freq: Every day | ORAL | Status: DC
  Administered 2018-01-03 – 2018-01-04 (×2): 7.5 mg via ORAL
  Filled 2018-01-03 (×2): qty 1

## 2018-01-03 MED ORDER — HEPARIN SODIUM (PORCINE) 5000 UNIT/ML IJ SOLN
5000.0000 [IU] | Freq: Three times a day (TID) | INTRAMUSCULAR | Status: DC
  Administered 2018-01-03 – 2018-01-05 (×5): 5000 [IU] via SUBCUTANEOUS
  Filled 2018-01-03 (×5): qty 1

## 2018-01-03 MED ORDER — FERROUS SULFATE 325 (65 FE) MG PO TABS
325.0000 mg | ORAL_TABLET | Freq: Every day | ORAL | Status: DC
  Administered 2018-01-04 – 2018-01-05 (×2): 325 mg via ORAL
  Filled 2018-01-03 (×2): qty 1

## 2018-01-03 MED ORDER — AMLODIPINE BESYLATE 10 MG PO TABS
10.0000 mg | ORAL_TABLET | Freq: Every day | ORAL | Status: DC
  Administered 2018-01-03 – 2018-01-05 (×3): 10 mg via ORAL
  Filled 2018-01-03 (×3): qty 1

## 2018-01-03 MED ORDER — LEVOTHYROXINE SODIUM 112 MCG PO TABS
112.0000 ug | ORAL_TABLET | Freq: Every day | ORAL | Status: DC
  Administered 2018-01-04 – 2018-01-05 (×2): 112 ug via ORAL
  Filled 2018-01-03 (×2): qty 1

## 2018-01-03 MED ORDER — SODIUM CHLORIDE 0.9 % IV BOLUS
1000.0000 mL | Freq: Once | INTRAVENOUS | Status: AC
  Administered 2018-01-03: 1000 mL via INTRAVENOUS

## 2018-01-03 MED ORDER — MELATONIN 5 MG PO TABS
5.0000 mg | ORAL_TABLET | Freq: Every day | ORAL | Status: DC
  Administered 2018-01-03 – 2018-01-04 (×2): 5 mg via ORAL
  Filled 2018-01-03 (×3): qty 1

## 2018-01-03 MED ORDER — GABAPENTIN 100 MG PO CAPS
200.0000 mg | ORAL_CAPSULE | Freq: Three times a day (TID) | ORAL | Status: DC
  Administered 2018-01-03 – 2018-01-05 (×5): 200 mg via ORAL
  Filled 2018-01-03 (×5): qty 2

## 2018-01-03 MED ORDER — MIRABEGRON ER 25 MG PO TB24
25.0000 mg | ORAL_TABLET | Freq: Every day | ORAL | Status: DC
  Administered 2018-01-03 – 2018-01-05 (×3): 25 mg via ORAL
  Filled 2018-01-03 (×3): qty 1

## 2018-01-03 MED ORDER — ATORVASTATIN CALCIUM 20 MG PO TABS
40.0000 mg | ORAL_TABLET | Freq: Every day | ORAL | Status: DC
  Administered 2018-01-04: 40 mg via ORAL
  Filled 2018-01-03: qty 2

## 2018-01-03 MED ORDER — INSULIN GLARGINE 100 UNIT/ML ~~LOC~~ SOLN
9.0000 [IU] | Freq: Every day | SUBCUTANEOUS | Status: DC
  Administered 2018-01-03 – 2018-01-04 (×2): 9 [IU] via SUBCUTANEOUS
  Filled 2018-01-03 (×3): qty 0.09

## 2018-01-03 MED ORDER — ACETAMINOPHEN 325 MG PO TABS
650.0000 mg | ORAL_TABLET | Freq: Four times a day (QID) | ORAL | Status: DC | PRN
  Administered 2018-01-04: 650 mg via ORAL
  Filled 2018-01-03: qty 2

## 2018-01-03 MED ORDER — CLOPIDOGREL BISULFATE 75 MG PO TABS
75.0000 mg | ORAL_TABLET | Freq: Every day | ORAL | Status: DC
  Administered 2018-01-03 – 2018-01-05 (×3): 75 mg via ORAL
  Filled 2018-01-03 (×3): qty 1

## 2018-01-03 MED ORDER — SODIUM CHLORIDE 0.9 % IV SOLN
INTRAVENOUS | Status: DC
  Administered 2018-01-03 – 2018-01-05 (×3): via INTRAVENOUS

## 2018-01-03 MED ORDER — INSULIN ASPART 100 UNIT/ML ~~LOC~~ SOLN: 0.0000 [IU] | Freq: Every day | SUBCUTANEOUS | Status: DC

## 2018-01-03 MED ORDER — LISINOPRIL 20 MG PO TABS
40.0000 mg | ORAL_TABLET | Freq: Every day | ORAL | Status: DC
  Administered 2018-01-04 – 2018-01-05 (×2): 40 mg via ORAL
  Filled 2018-01-03 (×2): qty 2

## 2018-01-03 MED ORDER — ALPRAZOLAM 0.25 MG PO TABS
0.2500 mg | ORAL_TABLET | Freq: Two times a day (BID) | ORAL | Status: DC
  Administered 2018-01-03 – 2018-01-05 (×4): 0.25 mg via ORAL
  Filled 2018-01-03 (×4): qty 1

## 2018-01-03 MED ORDER — INSULIN ASPART 100 UNIT/ML ~~LOC~~ SOLN
5.0000 [IU] | Freq: Three times a day (TID) | SUBCUTANEOUS | Status: DC
  Administered 2018-01-04 – 2018-01-05 (×5): 5 [IU] via SUBCUTANEOUS
  Filled 2018-01-03 (×5): qty 1

## 2018-01-03 MED ORDER — CLONIDINE HCL 0.1 MG PO TABS
0.1000 mg | ORAL_TABLET | Freq: Two times a day (BID) | ORAL | Status: DC
  Administered 2018-01-03 – 2018-01-05 (×4): 0.1 mg via ORAL
  Filled 2018-01-03 (×4): qty 1

## 2018-01-03 MED ORDER — ACETAMINOPHEN 650 MG RE SUPP: 650.0000 mg | Freq: Four times a day (QID) | RECTAL | Status: DC | PRN

## 2018-01-03 MED ORDER — VENLAFAXINE HCL ER 75 MG PO CP24
150.0000 mg | ORAL_CAPSULE | Freq: Two times a day (BID) | ORAL | Status: DC
  Administered 2018-01-03 – 2018-01-05 (×4): 150 mg via ORAL
  Filled 2018-01-03 (×4): qty 2

## 2018-01-03 NOTE — ED Provider Notes (Signed)
[facility] Emergency Department Provider Note   ____________________________________________   First MD Initiated Contact with Patient 01/03/18 1815     (approximate)  I have reviewed the triage vital signs and the nursing notes.   HISTORY  Chief Complaint Loss of Consciousness  EM caveat: Patient unable to recall some events, majority of history is gained from EMS.  Awaiting arrival of patient family  HPI CINDI GHAZARIAN is a 67 y.o. female   reports that she was at dinner, she was eating she started to get a funny hard to describe feeling.  She felt like she was going to pass out.  She then remembers waking up on the floor.  She does not think she choked, she reports she felt like she was very weak or going to pass out.  Denies noticing any new numbness or weakness in the arms or legs.  EMS reports patient was very stoic, occasionally seems like she was staring but no shaking or abnormal seizure-like movements.  EMS stroke screen reported to be normal  Patient denies any chest pain.  Reports she has not been sick recently.  Right now she feels better, and she does not have any specific concern or complaint.  Past Medical History:  Diagnosis Date  . Anemia   . Anemia of other chronic disease 09/01/2013  . Anxiety   . Arthritis    back s/p fracture x3 approx 1983  . Candidal vaginitis   . Diabetes mellitus without complication ([place])   . Diverticulosis of colon (without mention of hemorrhage) 05/20/2009   Internal hemms (Dr. Vira Agar)  . Forearm fracture    2013, left  . GERD (gastroesophageal reflux disease)    reflux HH 09/01/2002//egd reflux Esoph. HH Gastritis nonbleeding erosive gastropathy Duodentitis 08/15/2006  . Hyperglycemia   . Hyperlipidemia   . Hypertension   . Insomnia, unspecified   . Irritable bowel syndrome    history of  . Nervous breakdown 08/2002   [facility]  . Neuropathy   . Other abnormal blood chemistry   . Other chest  pain    chest discomfort  . Other esophagitis    erosive esophagitis  . Other specified disorders of adrenal glands 07/2007   adrenal mass via of CT 1.4cm left Adrenal no change(Dr.Cope)   . Other specified gastritis without mention of hemorrhage    erosvie gastritis  . Stroke ([place]) 03/2014  . Stroke ([place]) 03/2015  . Thyroid disease    hypothyroid, goiter  . Wears dentures    partial upper and lower.    Patient Active Problem List   Diagnosis Date Noted  . Problems with swallowing and mastication   . H/O adenomatous polyp of colon 05/18/2015  . Recurrent UTI 05/04/2015  . Cerebrovascular accident (CVA) due to occlusion of cerebral artery ([place])   . Petechial rash   . Venous hemorrhage   . Petechiae   . Absolute anemia   . Accelerated hypertension 03/11/2015  . Intractable nausea and vomiting 03/11/2015  . Anxiety 03/11/2015  . Stroke ([place]) 03/11/2015  . Hand pain 09/09/2014  . Acquired hypothyroidism 07/15/2014  . Major depression in remission ([place]) 07/15/2014  . Pure hypercholesterolemia 07/15/2014  . Type 2 diabetes mellitus with other diabetic neurological complication ([place]) 45/36/4680  . Type 2 diabetes mellitus with neurological manifestations, uncontrolled ([place]) 07/15/2014  . Psychomotor retardation 05/25/2014  . Syncope, cardiogenic 05/05/2014  . Tremor 05/05/2014  . FOM (frequency of micturition) 03/04/2014  . Multiple falls 11/28/2013  .  Elevated immunoglobulin A 09/02/2013  . Anemia of other chronic disease 09/01/2013  . Barrett's esophagus 08/18/2013  . IBS (irritable bowel syndrome) 08/05/2013  . IDA (iron deficiency anemia) 08/05/2013  . Other malaise and fatigue 07/29/2013  . Diarrhea 03/18/2013  . DM (diabetes mellitus), type 2 with neurological complications ([place]) 32/35/5732  . Costal chondritis 07/29/2012  . Bilateral leg pain 07/02/2012  . Atrophic vaginitis 10/12/2011  . Urinary incontinence 09/25/2011  . Urge incontinence of urine 09/21/2011  .  Diaphoresis 12/21/2010  . Leg edema 05/11/2010  . B12 deficiency 05/11/2010  . BENIGN POSITIONAL VERTIGO 01/06/2009  . ABDOMINAL PAIN, LEFT UPPER QUADRANT 12/10/2008  . UNSPECIFIED VITAMIN D DEFICIENCY 09/07/2008  . ROSACEA 09/07/2008  . Hereditary and idiopathic peripheral neuropathy 08/24/2008  . Other chest pain 05/19/2008  . EROSIVE ESOPHAGITIS 02/03/2008  . EROSIVE GASTRITIS 02/03/2008  . Female genuine stress incontinence 08/29/2007  . Incomplete emptying of bladder 08/13/2007  . Anxiety state 02/14/2007  . OBESITY 11/15/2006  . Extremity pain 09/28/2006  . ADRENAL MASS VIA CT 07/07/2006  . Hypothyroidism 07/05/2006  . HYPERLIPIDEMIA, MIXED 07/05/2006  . Essential hypertension 07/05/2006  . GERD 07/05/2006  . DIVERTICULOSIS, COLON 07/05/2006  . IBS 07/05/2006  . INSOMNIA 07/05/2006  . Bladder infection, chronic 06/14/2006    Past Surgical History:  Procedure Laterality Date  . ABDOMINAL HYSTERECTOMY  1995   Part Hyst BSO DUB ovarian cyst B9  . BREAST CYST ASPIRATION Bilateral   . BREAST REDUCTION SURGERY  1984  . CHOLECYSTECTOMY  03/2004  . COLONOSCOPY  1. 08/15/06  2. 05/20/09   Int Hemms  2. Divertics Int Hemms (Dr. Vira Agar)  . CT ABD W & PELVIS WO CM  6/09   CT Scan abd stable adrenal adenoma 1.4cm left adrenal no change (Dr. Jacqlyn Larsen)  . CYSTOSCOPY  01/08/2008   Former site of inflammation healed (Dr. Jacqlyn Larsen)  . ESOPHAGOGASTRODUODENOSCOPY  1. 09/01/02  2. 08/15/06   1. Reflux, HH  2. Reflux esoph HH gaastritis nonbleeding erosive gastropathy duodenitis  . ESOPHAGOGASTRODUODENOSCOPY (EGD) WITH PROPOFOL N/A 06/01/2016   Procedure: ESOPHAGOGASTRODUODENOSCOPY (EGD) WITH PROPOFOL;  Surgeon: Lucilla Lame, MD;  Location: [facility];  Service: Endoscopy;  Laterality: N/A;  diabetic - insulin and oral meds  . NSVD x1  1977  . REDUCTION MAMMAPLASTY  1985  . VAGINAL DELIVERY  1977    Prior to Admission medications   Medication Sig Start Date End Date Taking? Authorizing  Provider  ALPRAZolam (XANAX) 0.25 MG tablet Take 1 tablet (0.25 mg total) by mouth 2 (two) times daily. 03/15/15   Bettey Costa, MD  atorvastatin (LIPITOR) 40 MG tablet Take 1 tablet (40 mg total) by mouth daily at 6 PM. 04/21/15   Tonia Ghent, MD  bisacodyl (DULCOLAX) 10 MG suppository Place 10 mg rectally as needed for moderate constipation.    [provider]  Blood Glucose Monitoring Suppl (FIFTY50 GLUCOSE METER 2.0) w/Device KIT Use as directed. TRUE METRIX AIR E11.40 03/20/16   [provider]  cholecalciferol (VITAMIN D) 1000 units tablet Take 1,000 Units by mouth daily.    [provider]  cloNIDine (CATAPRES) 0.1 MG tablet Take by mouth. 03/16/16   [provider]  clopidogrel (PLAVIX) 75 MG tablet Take 1 tablet (75 mg total) by mouth daily. 04/22/15   Tonia Ghent, MD  CRANBERRY PO Take by mouth daily.    [provider]  Dexlansoprazole (DEXILANT PO) Take by mouth daily.    [provider]  ferrous  sulfate ([employer] IRON) 325 (65 FE) MG tablet Take 1 tablet (325 mg total) by mouth daily with breakfast. 04/08/15   Tonia Ghent, MD  gabapentin (NEURONTIN) 100 MG capsule Take 200 mg by mouth 3 (three) times daily.     [provider]  hydrochlorothiazide (HYDRODIURIL) 25 MG tablet Take 1 tablet (25 mg total) by mouth daily. 04/22/15   Tonia Ghent, MD  insulin glargine (LANTUS) 100 UNIT/ML injection Inject 9 Units into the skin at bedtime.    [provider]  levothyroxine (SYNTHROID, LEVOTHROID) 112 MCG tablet Take by mouth. 02/25/16   [provider]  Linaclotide (LINZESS PO) Take by mouth daily.    [provider]  lisinopril (PRINIVIL,ZESTRIL) 40 MG tablet Take 1 tablet (40 mg total) by mouth daily. Patient taking differently: Take 2.5 mg by mouth daily.  04/27/15   Tonia Ghent, MD  Melatonin 5 MG TABS Take 5 mg by mouth at bedtime.    [provider]  metFORMIN (GLUCOPHAGE) 1000 MG  tablet Take 0.5 tablets (500 mg total) by mouth 2 (two) times daily with a meal. 02/09/15   Tonia Ghent, MD  metoprolol succinate (TOPROL-XL) 100 MG 24 hr tablet Take 1 tablet (100 mg total) by mouth daily. Take with or immediately following a meal. 06/23/15   Tonia Ghent, MD  mirabegron ER (MYRBETRIQ) 25 MG TB24 tablet Take 1 tablet (25 mg total) by mouth daily. 09/02/15   Nickie Retort, MD  mirabegron ER (MYRBETRIQ) 25 MG TB24 tablet Take 1 tablet (25 mg total) by mouth daily. 03/20/17   Zara Council A, PA-C  mirtazapine (REMERON) 7.5 MG tablet Take by mouth. 04/20/16 04/20/17  [provider]  pantoprazole (PROTONIX) 40 MG tablet Take 1 tablet (40 mg total) by mouth 2 (two) times daily. 04/25/15   Tonia Ghent, MD  ranitidine (ZANTAC) 300 MG tablet Take 1 tablet (300 mg total) by mouth daily. 01/08/15   Tonia Ghent, MD  ULTICARE ALCOHOL SWABS PADS Apply 1 each topically as directed. E11.40 03/16/16   [provider]  venlafaxine XR (EFFEXOR-XR) 150 MG 24 hr capsule Take 1 capsule (150 mg total) by mouth 2 (two) times daily. 01/08/15   Tonia Ghent, MD  vitamin B-12 (CYANOCOBALAMIN) 1000 MCG tablet Take 1,000 mcg by mouth daily.     [provider]    Allergies Sulfa antibiotics  Family History  Problem Relation Age of Onset  . Hypertension Mother   . Hyperlipidemia Mother   . Stroke Mother        37  . Vision loss Father        vision problems  . Cancer Father        Colon   . Hyperlipidemia Sister   . Hypertension Sister   . Vision loss Sister   . Breast cancer Maternal Grandmother 73  . Breast cancer Cousin        paternal cousin  . Prostate cancer Paternal Grandfather   . Bladder Cancer Neg Hx   . Kidney cancer Neg Hx     Social History Social History   Tobacco Use  . Smoking status: Never Smoker  . Smokeless tobacco: Never Used  Substance Use Topics  . Alcohol use: No    Alcohol/week: 0.0 standard drinks  . Drug  use: No    Review of Systems Constitutional: No fever/chills Eyes: No visual changes. ENT: No sore throat. Cardiovascular: Denies chest pain. Respiratory: Denies shortness of  breath. Gastrointestinal: No abdominal pain.   Genitourinary: Negative for dysuria. Musculoskeletal: Negative for back pain. Skin: Negative for rash. Neurological: Negative for headaches, areas of focal weakness or numbness.  Reports she did however think she passed out.    ____________________________________________   PHYSICAL EXAM:  VITAL SIGNS: ED Triage Vitals  Enc Vitals Group     BP 01/03/18 1817 (!) 161/81     Pulse Rate 01/03/18 1817 63     Resp 01/03/18 1817 18     Temp 01/03/18 1817 98.1 F (36.7 C)     Temp Source 01/03/18 1817 Oral     SpO2 01/03/18 1817 100 %     Weight 01/03/18 1819 128 lb (58.1 kg)     Height 01/03/18 1819 5' 2"  (1.575 m)     Head Circumference --      Peak Flow --      Pain Score 01/03/18 1819 0     Pain Loc --      Pain Edu? --      Excl. in [place]? --     Constitutional: Alert and oriented though chest slightly somnolent.  Moderately ill-appearing, but in no distress.  Somewhat pale in complexion She is alert, oriented to self and date.  She denies any complaint at present Eyes: Conjunctivae are normal. Head: Atraumatic. Nose: No congestion/rhinnorhea. Mouth/Throat: Mucous membranes are moist. Neck: No stridor.  Cardiovascular: Normal rate, regular rhythm. Grossly normal heart sounds.  Good peripheral circulation. Respiratory: Normal respiratory effort.  No retractions. Lungs CTAB. Gastrointestinal: Soft and nontender. No distention. Musculoskeletal: No lower extremity tenderness nor edema. Neurologic:  Normal speech and language. No gross focal neurologic deficits are appreciated.  Symmetric smile.  Able to lift all extremities against gravity without difficulty.  No focal deficits noted on exam.  No ataxia.  Normal extraocular movements. VAN (LVO screen)  normal.  Does report some numbness in both lower feet, but reports this is due to neuropathy and not new.  Denies any numbness across the face or upper arms and torso. Skin:  Skin is warm, dry and intact. No rash noted. Psychiatric: Mood and affect are very flat, seems to slightly somnolent. Speech and behavior are normal.  ____________________________________________   LABS (all labs ordered are listed, but only abnormal results are displayed)  Labs Reviewed  CBC WITH DIFFERENTIAL/PLATELET - Abnormal; Notable for the following components:      Result Value   RBC 3.71 (*)    Hemoglobin 10.3 (*)    HCT 32.0 (*)    All other components within normal limits  AMMONIA - Abnormal; Notable for the following components:   Ammonia <9 (*)    All other components within normal limits  BASIC METABOLIC PANEL - Abnormal; Notable for the following components:   Glucose, Bld 125 (*)    BUN 30 (*)    Creatinine, Ser 1.44 (*)    GFR calc non Af Amer 38 (*)    GFR calc Af Amer 44 (*)    All other components within normal limits  TSH - Abnormal; Notable for the following components:   TSH 7.927 (*)    All other components within normal limits  TROPONIN I  PROTIME-INR  URINALYSIS, COMPLETE (UACMP) WITH MICROSCOPIC   ____________________________________________  EKG  Reviewed and entered by me at 1820 Heart rate 60 QRS 110 QTc 440 Normal sinus rhythm, probable LVH with repolarization abnormality. ____________________________________________  RADIOLOGY  Ct Head Wo Contrast  Result Date: 01/03/2018 CLINICAL DATA:  Syncope.  EXAM: CT HEAD WITHOUT CONTRAST TECHNIQUE: Contiguous axial images were obtained from the base of the skull through the vertex without intravenous contrast. COMPARISON:  Head CT and MRI 01/24/2016 FINDINGS: Brain: There is no evidence of acute infarct, intracranial hemorrhage, mass, midline shift, or extra-axial fluid collection. There is mild cerebral atrophy. Patchy to  confluent hypodensities are nonspecific but compatible with extensive chronic small vessel ischemic disease, greatly advanced for age and unchanged. Vascular: Calcified atherosclerosis at the skull base. No hyperdense vessel. Skull: No fracture or focal osseous lesion. Sinuses/Orbits: Mild right posterior ethmoid air cell mucosal thickening. Clear mastoid air cells. Unremarkable included orbits. Other: None. IMPRESSION: 1. No evidence of acute intracranial abnormality. 2. Advanced chronic small vessel ischemic disease. Electronically Signed   By: Logan Bores M.D.   On: 01/03/2018 19:05   Dg Chest Port 1 View  Result Date: 01/03/2018 CLINICAL DATA:  Possible choking type event with syncope. EXAM: PORTABLE CHEST 1 VIEW COMPARISON:  04/02/2015 FINDINGS: The cardiomediastinal silhouette is unchanged with normal heart size. Aortic atherosclerosis is noted. There is slight elevation of the left hemidiaphragm. No airspace consolidation, edema, pleural effusion, or pneumothorax is identified. Right upper quadrant abdominal surgical clips, thoracolumbar scoliosis, and a chronic L1 compression fracture are noted. IMPRESSION: No active disease. Electronically Signed   By: Logan Bores M.D.   On: 01/03/2018 19:08    ____________________________________________   PROCEDURES  Procedure(s) performed: None  Procedures  Critical Care performed: No  ____________________________________________   INITIAL IMPRESSION / ASSESSMENT AND PLAN / ED COURSE  Pertinent labs & imaging results that were available during my care of the patient were reviewed by me and considered in my medical decision making (see chart for details).   Patient presents for evaluation of episode of syncope, gathering history of sounds at does not appear to be a choking type incident.  Patient experienced presyncopal symptoms, but has returned to her baseline at this time.  After family arrived, she is alert, more conversant.  Suspect based  on lab work there may be some element of dehydration.  No evidence of acute cardiac pulmonary or neurologic etiology.  I did perform a repeat neurologic examination her which is stable at 7:15 PM without change.  Except, her level of alertness seems to improve somewhat.  She is now conversant.  Resting comfortably, patient requesting to eat.  Discussed with patient and family, all agreeable with the plan for further hydration via IV and  observation.      ____________________________________________   FINAL CLINICAL IMPRESSION(S) / ED DIAGNOSES  Final diagnoses:  Syncope and collapse  Dehydration        Note:  This document was prepared using Dragon voice recognition software and may include unintentional dictation errors       Delman Kitten, MD 01/03/18 1926

## 2018-01-03 NOTE — H&P (Signed)
[facility] [place] NAME: Elizabeth Rivera    MR#:  [id number]  DATE OF BIRTH:  Dec 26, 1950  DATE OF ADMISSION:  01/03/2018  PRIMARY CARE PHYSICIAN: Kirk Ruths, MD   REQUESTING/REFERRING PHYSICIAN: Dr. Jacqualine Code.  CHIEF COMPLAINT:   Chief Complaint  Patient presents with  . Loss of Consciousness   Syncope and generalized weakness. HISTORY OF PRESENT ILLNESS:  Elizabeth Rivera  is a 67 y.o. female with a known history of anemia, anxiety, arthritis, hypertension, diabetes, hyperlipidemia, IBS, diverticulosis, neuropathy etc.  The patient presents the ED with above chief complaint.  The patient was at dinner and started feeling not good.  She said she passed out but she cannot remember what happened.  According to her husband, she passed out for 1 minutes.  The patient also complains of generalized weakness but denies any focal weakness, numbness or tingling.  She denies any dysuria, hematuria or urgency.  CAT scan of the head is unremarkable.  Labs show acute renal failure with dehydration.  Dr. Jacqualine Code requested observation.  PAST MEDICAL HISTORY:   Past Medical History:  Diagnosis Date  . Anemia   . Anemia of other chronic disease 09/01/2013  . Anxiety   . Arthritis    back s/p fracture x3 approx 1983  . Candidal vaginitis   . Diabetes mellitus without complication ([place])   . Diverticulosis of colon (without mention of hemorrhage) 05/20/2009   Internal hemms (Dr. Vira Agar)  . Forearm fracture    2013, left  . GERD (gastroesophageal reflux disease)    reflux HH 09/01/2002//egd reflux Esoph. HH Gastritis nonbleeding erosive gastropathy Duodentitis 08/15/2006  . Hyperglycemia   . Hyperlipidemia   . Hypertension   . Insomnia, unspecified   . Irritable bowel syndrome    history of  . Nervous breakdown 08/2002   [facility]  . Neuropathy   . Other abnormal blood chemistry   . Other chest pain    chest discomfort  . Other  esophagitis    erosive esophagitis  . Other specified disorders of adrenal glands 07/2007   adrenal mass via of CT 1.4cm left Adrenal no change(Dr.Cope)   . Other specified gastritis without mention of hemorrhage    erosvie gastritis  . Stroke ([place]) 03/2014  . Stroke ([place]) 03/2015  . Thyroid disease    hypothyroid, goiter  . Wears dentures    partial upper and lower.    PAST SURGICAL HISTORY:   Past Surgical History:  Procedure Laterality Date  . ABDOMINAL HYSTERECTOMY  1995   Part Hyst BSO DUB ovarian cyst B9  . BREAST CYST ASPIRATION Bilateral   . BREAST REDUCTION SURGERY  1984  . CHOLECYSTECTOMY  03/2004  . COLONOSCOPY  1. 08/15/06  2. 05/20/09   Int Hemms  2. Divertics Int Hemms (Dr. Vira Agar)  . CT ABD W & PELVIS WO CM  6/09   CT Scan abd stable adrenal adenoma 1.4cm left adrenal no change (Dr. Jacqlyn Larsen)  . CYSTOSCOPY  01/08/2008   Former site of inflammation healed (Dr. Jacqlyn Larsen)  . ESOPHAGOGASTRODUODENOSCOPY  1. 09/01/02  2. 08/15/06   1. Reflux, HH  2. Reflux esoph HH gaastritis nonbleeding erosive gastropathy duodenitis  . ESOPHAGOGASTRODUODENOSCOPY (EGD) WITH PROPOFOL N/A 06/01/2016   Procedure: ESOPHAGOGASTRODUODENOSCOPY (EGD) WITH PROPOFOL;  Surgeon: Lucilla Lame, MD;  Location: [facility];  Service: Endoscopy;  Laterality: N/A;  diabetic - insulin and oral meds  . NSVD x1  1977  . REDUCTION MAMMAPLASTY  [place]    SOCIAL HISTORY:   Social History   Tobacco Use  . Smoking status: Never Smoker  . Smokeless tobacco: Never Used  Substance Use Topics  . Alcohol use: No    Alcohol/week: 0.0 standard drinks    FAMILY HISTORY:   Family History  Problem Relation Age of Onset  . Hypertension Mother   . Hyperlipidemia Mother   . Stroke Mother        27  . Vision loss Father        vision problems  . Cancer Father        Colon   . Hyperlipidemia Sister   . Hypertension Sister   . Vision loss Sister   . Breast cancer Maternal Grandmother  31  . Breast cancer Cousin        paternal cousin  . Prostate cancer Paternal Grandfather   . Bladder Cancer Neg Hx   . Kidney cancer Neg Hx     DRUG ALLERGIES:   Allergies  Allergen Reactions  . Sulfa Antibiotics Rash    And angioedmea    REVIEW OF SYSTEMS:   Review of Systems  Constitutional: Positive for malaise/fatigue. Negative for chills and fever.  HENT: Negative for sore throat.   Eyes: Negative for blurred vision and double vision.  Respiratory: Negative for cough, hemoptysis, shortness of breath, wheezing and stridor.   Cardiovascular: Negative for chest pain, palpitations, orthopnea and leg swelling.  Gastrointestinal: Negative for abdominal pain, blood in stool, diarrhea, melena, nausea and vomiting.  Genitourinary: Negative for dysuria, flank pain and hematuria.  Musculoskeletal: Negative for back pain and joint pain.  Skin: Negative for rash.  Neurological: Positive for loss of consciousness. Negative for dizziness, tingling, sensory change, speech change, focal weakness, seizures, weakness and headaches.  Endo/Heme/Allergies: Negative for polydipsia.  Psychiatric/Behavioral: Negative for depression. The patient is not nervous/anxious.     MEDICATIONS AT HOME:   Prior to Admission medications   Medication Sig Start Date End Date Taking? Authorizing Provider  ALPRAZolam (XANAX) 0.25 MG tablet Take 1 tablet (0.25 mg total) by mouth 2 (two) times daily. 03/15/15  Yes Mody, Ulice Bold, MD  amLODipine (NORVASC) 10 MG tablet Take 10 mg by mouth daily.   Yes [provider]  atorvastatin (LIPITOR) 40 MG tablet Take 1 tablet (40 mg total) by mouth daily at 6 PM. 04/21/15  Yes Tonia Ghent, MD  cholecalciferol (VITAMIN D) 1000 units tablet Take 1,000 Units by mouth daily.   Yes [provider]  cloNIDine (CATAPRES) 0.1 MG tablet Take by mouth. 03/16/16  Yes [provider]  clopidogrel (PLAVIX) 75 MG tablet Take 1 tablet (75 mg total) by mouth  daily. 04/22/15  Yes Tonia Ghent, MD  CRANBERRY PO Take by mouth daily.   Yes [provider]  Dexlansoprazole (DEXILANT PO) Take by mouth daily.   Yes [provider]  ferrous sulfate ([employer] IRON) 325 (65 FE) MG tablet Take 1 tablet (325 mg total) by mouth daily with breakfast. 04/08/15  Yes Tonia Ghent, MD  gabapentin (NEURONTIN) 100 MG capsule Take 200 mg by mouth 3 (three) times daily.    Yes [provider]  hydrochlorothiazide (HYDRODIURIL) 25 MG tablet Take 1 tablet (25 mg total) by mouth daily. 04/22/15  Yes Tonia Ghent, MD  insulin aspart (NOVOLOG) 100 UNIT/ML injection Inject 5 Units into the skin 3 (three) times daily before meals.   Yes [provider]  insulin  glargine (LANTUS) 100 UNIT/ML injection Inject 9 Units into the skin at bedtime.   Yes [provider]  levothyroxine (SYNTHROID, LEVOTHROID) 112 MCG tablet Take by mouth. 02/25/16  Yes [provider]  lisinopril (PRINIVIL,ZESTRIL) 40 MG tablet Take 1 tablet (40 mg total) by mouth daily. 04/27/15  Yes Tonia Ghent, MD  Melatonin 5 MG TABS Take 5 mg by mouth at bedtime.   Yes [provider]  metFORMIN (GLUCOPHAGE) 1000 MG tablet Take 0.5 tablets (500 mg total) by mouth 2 (two) times daily with a meal. 02/09/15  Yes Tonia Ghent, MD  mirabegron ER (MYRBETRIQ) 25 MG TB24 tablet Take 1 tablet (25 mg total) by mouth daily. 09/02/15  Yes Nickie Retort, MD  mirtazapine (REMERON) 7.5 MG tablet Take by mouth. 04/20/16 01/03/18 Yes [provider]  pantoprazole (PROTONIX) 40 MG tablet Take 1 tablet (40 mg total) by mouth 2 (two) times daily. 04/25/15  Yes Tonia Ghent, MD  venlafaxine XR (EFFEXOR-XR) 150 MG 24 hr capsule Take 1 capsule (150 mg total) by mouth 2 (two) times daily. 01/08/15  Yes Tonia Ghent, MD  vitamin B-12 (CYANOCOBALAMIN) 1000 MCG tablet Take 1,000 mcg by mouth daily.    Yes [provider]  bisacodyl (DULCOLAX) 10 MG  suppository Place 10 mg rectally as needed for moderate constipation.    [provider]  Blood Glucose Monitoring Suppl (FIFTY50 GLUCOSE METER 2.0) w/Device KIT Use as directed. TRUE METRIX AIR E11.40 03/20/16   [provider]  Linaclotide (LINZESS PO) Take by mouth daily.    [provider]  metoprolol succinate (TOPROL-XL) 100 MG 24 hr tablet Take 1 tablet (100 mg total) by mouth daily. Take with or immediately following a meal. Patient not taking: Reported on 01/03/2018 06/23/15   Tonia Ghent, MD  ranitidine (ZANTAC) 300 MG tablet Take 1 tablet (300 mg total) by mouth daily. Patient not taking: Reported on 01/03/2018 01/08/15   Tonia Ghent, MD      VITAL SIGNS:  Blood pressure (!) 145/76, pulse 63, temperature 98.1 F (36.7 C), temperature source Oral, resp. rate 16, height 5' 2"  (1.575 m), weight 58.1 kg, SpO2 100 %.  PHYSICAL EXAMINATION:  Physical Exam  GENERAL:  67 y.o.-year-old patient lying in the bed with no acute distress.  EYES: Pupils equal, round, reactive to light and accommodation. No scleral icterus. Extraocular muscles intact.  HEENT: Head atraumatic, normocephalic. Oropharynx and nasopharynx clear.  Dry oral mucosa. NECK:  Supple, no jugular venous distention. No thyroid enlargement, no tenderness.  LUNGS: Normal breath sounds bilaterally, no wheezing, rales,rhonchi or crepitation. No use of accessory muscles of respiration.  CARDIOVASCULAR: S1, S2 normal. No murmurs, rubs, or gallops.  ABDOMEN: Soft, nontender, nondistended. Bowel sounds present. No organomegaly or mass.  EXTREMITIES: No pedal edema, cyanosis, or clubbing.  NEUROLOGIC: Cranial nerves II through XII are intact. Muscle strength 5/5 in all extremities. Sensation intact. Gait not checked.  PSYCHIATRIC: The patient is alert and oriented x 3.  SKIN: No obvious rash, lesion, or ulcer.   LABORATORY PANEL:   CBC Recent Labs  Lab 01/03/18 1823  WBC 6.1  HGB 10.3*  HCT  32.0*  PLT 214   ------------------------------------------------------------------------------------------------------------------  Chemistries  Recent Labs  Lab 01/03/18 1823  NA 136  K 3.5  CL 98  CO2 29  GLUCOSE 125*  BUN 30*  CREATININE 1.44*  CALCIUM 9.6   ------------------------------------------------------------------------------------------------------------------  Cardiac Enzymes Recent Labs  Lab 01/03/18 1823  TROPONINI <  0.03   ------------------------------------------------------------------------------------------------------------------  RADIOLOGY:  Ct Head Wo Contrast  Result Date: 01/03/2018 CLINICAL DATA:  Syncope. EXAM: CT HEAD WITHOUT CONTRAST TECHNIQUE: Contiguous axial images were obtained from the base of the skull through the vertex without intravenous contrast. COMPARISON:  Head CT and MRI 01/24/2016 FINDINGS: Brain: There is no evidence of acute infarct, intracranial hemorrhage, mass, midline shift, or extra-axial fluid collection. There is mild cerebral atrophy. Patchy to confluent hypodensities are nonspecific but compatible with extensive chronic small vessel ischemic disease, greatly advanced for age and unchanged. Vascular: Calcified atherosclerosis at the skull base. No hyperdense vessel. Skull: No fracture or focal osseous lesion. Sinuses/Orbits: Mild right posterior ethmoid air cell mucosal thickening. Clear mastoid air cells. Unremarkable included orbits. Other: None. IMPRESSION: 1. No evidence of acute intracranial abnormality. 2. Advanced chronic small vessel ischemic disease. Electronically Signed   By: Logan Bores M.D.   On: 01/03/2018 19:05   Dg Chest Port 1 View  Result Date: 01/03/2018 CLINICAL DATA:  Possible choking type event with syncope. EXAM: PORTABLE CHEST 1 VIEW COMPARISON:  04/02/2015 FINDINGS: The cardiomediastinal silhouette is unchanged with normal heart size. Aortic atherosclerosis is noted. There is slight elevation of  the left hemidiaphragm. No airspace consolidation, edema, pleural effusion, or pneumothorax is identified. Right upper quadrant abdominal surgical clips, thoracolumbar scoliosis, and a chronic L1 compression fracture are noted. IMPRESSION: No active disease. Electronically Signed   By: Logan Bores M.D.   On: 01/03/2018 19:08      IMPRESSION AND PLAN:   Acute renal failure due to dehydration. The patient will be placed for observation. Hold HCTZ, IV fluid support and follow-up BMP.  Syncope, possible due to dehydration.  Check orthostatic vital signs. Echocardiograph and carotid duplex.  Telemetry monitor.  Hypertension.  Continue home hypertension medication.  Diabetes.  Continue Lantus, NovoLog AC, start sliding scale.  Anemia of chronic disease.  Stable. All the records are reviewed and case discussed with ED provider. Management plans discussed with the patient, her husband and daughter and they are in agreement.  CODE STATUS: Limited code.  TOTAL TIME TAKING CARE OF THIS PATIENT: 35 minutes.    Demetrios Loll M.D on 01/03/2018 at 8:33 PM  Between 7am to 6pm - Pager - [phone]  After 6pm go to [email or web address] - [employer] [facility]  Office  [phone]  CC: Primary care physician; Kirk Ruths, MD   Note: This dictation was prepared with Dragon dictation along with smaller phrase technology. Any transcriptional errors that result from this process are unin

## 2018-01-03 NOTE — ED Triage Notes (Signed)
Pt and husband were at a restaurant. Pt had syncopal episode. Someone gave heimlich but pt was not choking. Pt a&o x 3 on ems arrival and pt walked to the cot.

## 2018-01-03 NOTE — ED Notes (Signed)
Report called to 2c

## 2018-01-03 NOTE — Progress Notes (Signed)
Advanced Care Plan.  Purpose of Encounter: CODE STATUS. Parties in Attendance: The patient, her husband and daughter, me. Patient's Decisional Capacity: Yes. Medical Story: Elizabeth Rivera  is a 67 y.o. female with a known history of anemia, anxiety, arthritis, hypertension, diabetes, hyperlipidemia, IBS, diverticulosis, neuropathy etc. she is being admitted for syncope, acute renal failure due to dehydration.  I discussed with the patient about her current condition, prognosis and CODE STATUS.  The patient stated that she does not want to be intubated or shock.  But she does want CPR and medication treatment. Plan:  Code Status: Limited code. Time spent discussing advance care planning: 18 minutes.

## 2018-01-04 ENCOUNTER — Observation Stay: Payer: Medicare HMO

## 2018-01-04 ENCOUNTER — Observation Stay
Admit: 2018-01-04 | Discharge: 2018-01-04 | Disposition: A | Discharge status: Home / Self Care | Payer: Medicare HMO | Attending: Internal Medicine | Admitting: Internal Medicine

## 2018-01-04 DIAGNOSIS — I6523 Occlusion and stenosis of bilateral carotid arteries: Secondary | ICD-10-CM | POA: Diagnosis not present

## 2018-01-04 DIAGNOSIS — I10 Essential (primary) hypertension: Secondary | ICD-10-CM | POA: Diagnosis not present

## 2018-01-04 DIAGNOSIS — E860 Dehydration: Secondary | ICD-10-CM | POA: Diagnosis not present

## 2018-01-04 DIAGNOSIS — R55 Syncope and collapse: Secondary | ICD-10-CM | POA: Diagnosis not present

## 2018-01-04 DIAGNOSIS — I00 Rheumatic fever without heart involvement: Secondary | ICD-10-CM | POA: Diagnosis not present

## 2018-01-04 LAB — BASIC METABOLIC PANEL
Anion gap: 6 (ref 5–15)
BUN: 26 mg/dL — ABNORMAL HIGH (ref 8–23)
CALCIUM: 8.9 mg/dL (ref 8.9–10.3)
CO2: 29 mmol/L (ref 22–32)
Chloride: 103 mmol/L (ref 98–111)
Creatinine, Ser: 1.25 mg/dL — ABNORMAL HIGH (ref 0.44–1.00)
GFR calc Af Amer: 52 mL/min — ABNORMAL LOW (ref >60)
GFR calc non Af Amer: 45 mL/min — ABNORMAL LOW (ref >60)
Glucose, Bld: 249 mg/dL — ABNORMAL HIGH (ref 70–99)
Potassium: 3.3 mmol/L — ABNORMAL LOW (ref 3.5–5.1)
SODIUM: 138 mmol/L (ref 135–145)

## 2018-01-04 LAB — CBC
HCT: 27.5 % — ABNORMAL LOW (ref 36.0–46.0)
Hemoglobin: 8.8 g/dL — ABNORMAL LOW (ref 12.0–15.0)
MCH: 27.2 pg (ref 26.0–34.0)
MCHC: 32 g/dL (ref 30.0–36.0)
MCV: 85.1 fL (ref 80.0–100.0)
PLATELETS: 156 10*3/uL (ref 150–400)
RBC: 3.23 MIL/uL — ABNORMAL LOW (ref 3.87–5.11)
RDW: 13 % (ref 11.5–15.5)
WBC: 4.8 10*3/uL (ref 4.0–10.5)
nRBC: 0 % (ref 0.0–0.2)

## 2018-01-04 LAB — GLUCOSE, CAPILLARY
Glucose-Capillary: 114 mg/dL — ABNORMAL HIGH (ref 70–99)
Glucose-Capillary: 203 mg/dL — ABNORMAL HIGH (ref 70–99)
Glucose-Capillary: 149 mg/dL — ABNORMAL HIGH (ref 70–99)
Glucose-Capillary: 121 mg/dL — ABNORMAL HIGH (ref 70–99)

## 2018-01-04 MED ORDER — POTASSIUM CHLORIDE CRYS ER 20 MEQ PO TBCR
40.0000 meq | EXTENDED_RELEASE_TABLET | Freq: Once | ORAL | Status: AC
  Administered 2018-01-04: 40 meq via ORAL
  Filled 2018-01-04: qty 2

## 2018-01-04 NOTE — Care Management Obs Status (Signed)
Echo NOTIFICATION   Patient Details  Name: ALAA MULLALLY MRN: [id number] Date of Birth: 1950/11/11   Medicare Observation Status Notification Given:  Yes    Diamon Reddinger A Cymone Yeske, RN 01/04/2018, 10:22 AM

## 2018-01-04 NOTE — Progress Notes (Signed)
*  PRELIMINARY RESULTS* Echocardiogram 2D Echocardiogram has been performed.  Elizabeth Rivera 01/04/2018, 8:14 AM

## 2018-01-04 NOTE — Care Management Note (Signed)
Case Management Note  Patient Details  Name: Elizabeth Rivera MRN: [id number] Date of Birth: 1950/06/23  Subjective/Objective:   Patient admitted to [facility] under observation status for acute renal failure. RNCM consulted on patient to provide MOON letter and complete assessment. Patient currently lives with her husband. She is completely independent at baseline and still drives. Does use a cane occasionally as needed. PCP is Ouida Sills. Uses [facility] pharmacy and obtains medications without issue. Patient and spouse express no disposition needs.                   Action/Plan:   Expected Discharge Date:  01/05/18               Expected Discharge Plan:     In-House Referral:     Discharge planning Services     Post Acute Care Choice:    Choice offered to:     DME Arranged:    DME Agency:     HH Arranged:    HH Agency:     Status of Service:     If discussed at [employer], dates discussed:    Additional Comments:  Kathyrn Drown Yasmin Dibello, RN 01/04/2018, 10:26 AM

## 2018-01-04 NOTE — Progress Notes (Signed)
Echo results not available. Hopefully discharge tomorrow when available.

## 2018-01-05 DIAGNOSIS — E860 Dehydration: Secondary | ICD-10-CM | POA: Diagnosis not present

## 2018-01-05 DIAGNOSIS — I10 Essential (primary) hypertension: Secondary | ICD-10-CM | POA: Diagnosis not present

## 2018-01-05 DIAGNOSIS — R55 Syncope and collapse: Secondary | ICD-10-CM | POA: Diagnosis not present

## 2018-01-05 DIAGNOSIS — I00 Rheumatic fever without heart involvement: Secondary | ICD-10-CM | POA: Diagnosis not present

## 2018-01-05 LAB — GLUCOSE, CAPILLARY
Glucose-Capillary: 122 mg/dL — ABNORMAL HIGH (ref 70–99)
Glucose-Capillary: 158 mg/dL — ABNORMAL HIGH (ref 70–99)
Glucose-Capillary: 187 mg/dL — ABNORMAL HIGH (ref 70–99)

## 2018-01-05 LAB — ECHOCARDIOGRAM COMPLETE
Height: 62 in
WEIGHTICAEL: 2048 [oz_av]

## 2018-01-05 LAB — HIV ANTIBODY (ROUTINE TESTING W REFLEX): HIV Screen 4th Generation wRfx: NONREACTIVE

## 2018-01-05 NOTE — Progress Notes (Signed)
Patient discharge teaching given, including activity, diet, follow-up appoints, and medications. Patient verbalized understanding of all discharge instructions. IV access was d/c'd. Vitals are stable. Skin is intact except as charted in most recent assessments. Pt to be escorted out by NT, to be driven home by family.  Seann Genther  

## 2018-01-05 NOTE — Discharge Instructions (Signed)
Resume diet and activity as before ° ° °

## 2018-01-05 NOTE — Progress Notes (Signed)
[facility] [place] NAME: Elizabeth Rivera    MR#:  [id number]  DATE OF BIRTH:  Mar 25, 1950  SUBJECTIVE:  CHIEF COMPLAINT:   Chief Complaint  Patient presents with  . Loss of Consciousness   Feels better. Still fatigued. No further syncope  Echo done but report pending  REVIEW OF SYSTEMS:    Review of Systems  Constitutional: Positive for malaise/fatigue. Negative for chills and fever.  HENT: Negative for sore throat.   Eyes: Negative for blurred vision, double vision and pain.  Respiratory: Negative for cough, hemoptysis, shortness of breath and wheezing.   Cardiovascular: Negative for chest pain, palpitations, orthopnea and leg swelling.  Gastrointestinal: Negative for abdominal pain, constipation, diarrhea, heartburn, nausea and vomiting.  Genitourinary: Negative for dysuria and hematuria.  Musculoskeletal: Negative for back pain and joint pain.  Skin: Negative for rash.  Neurological: Negative for sensory change, speech change, focal weakness and headaches.  Endo/Heme/Allergies: Does not bruise/bleed easily.  Psychiatric/Behavioral: Negative for depression. The patient is not nervous/anxious.     DRUG ALLERGIES:   Allergies  Allergen Reactions  . Sulfa Antibiotics Rash    And angioedmea    VITALS:  Blood pressure (!) 104/55, pulse 61, temperature (!) 97.4 F (36.3 C), temperature source Oral, resp. rate 16, height 5\' 2"  (1.575 m), weight 58.1 kg, SpO2 99 %.  PHYSICAL EXAMINATION:   Physical Exam  GENERAL:  67 y.o.-year-old patient lying in the bed with no acute distress.  EYES: Pupils equal, round, reactive to light and accommodation. No scleral icterus. Extraocular muscles intact.  HEENT: Head atraumatic, normocephalic. Oropharynx and nasopharynx clear.  NECK:  Supple, no jugular venous distention. No thyroid enlargement, no tenderness.  LUNGS: Normal breath sounds bilaterally, no wheezing, rales, rhonchi. No use of  accessory muscles of respiration.  CARDIOVASCULAR: S1, S2 normal. No murmurs, rubs, or gallops.  ABDOMEN: Soft, nontender, nondistended. Bowel sounds present. No organomegaly or mass.  EXTREMITIES: No cyanosis, clubbing or edema b/l.    NEUROLOGIC: Cranial nerves II through XII are intact. No focal Motor or sensory deficits b/l.   PSYCHIATRIC: The patient is alert and oriented x 3.  SKIN: No obvious rash, lesion, or ulcer.   LABORATORY PANEL:   CBC Recent Labs  Lab 01/04/18 0357  WBC 4.8  HGB 8.8*  HCT 27.5*  PLT 156   ------------------------------------------------------------------------------------------------------------------ Chemistries  Recent Labs  Lab 01/03/18 1823 01/04/18 0357  NA 136 138  K 3.5 3.3*  CL 98 103  CO2 29 29  GLUCOSE 125* 249*  BUN 30* 26*  CREATININE 1.44* 1.25*  CALCIUM 9.6 8.9  MG 1.6*  --    ------------------------------------------------------------------------------------------------------------------  Cardiac Enzymes Recent Labs  Lab 01/03/18 1823  TROPONINI <0.03   ------------------------------------------------------------------------------------------------------------------  RADIOLOGY:  Ct Head Wo Contrast  Result Date: 01/03/2018 CLINICAL DATA:  Syncope. EXAM: CT HEAD WITHOUT CONTRAST TECHNIQUE: Contiguous axial images were obtained from the base of the skull through the vertex without intravenous contrast. COMPARISON:  Head CT and MRI 01/24/2016 FINDINGS: Brain: There is no evidence of acute infarct, intracranial hemorrhage, mass, midline shift, or extra-axial fluid collection. There is mild cerebral atrophy. Patchy to confluent hypodensities are nonspecific but compatible with extensive chronic small vessel ischemic disease, greatly advanced for age and unchanged. Vascular: Calcified atherosclerosis at the skull base. No hyperdense vessel. Skull: No fracture or focal osseous lesion. Sinuses/Orbits: Mild right posterior ethmoid  air cell mucosal thickening. Clear mastoid air cells. Unremarkable included orbits. Other: None. IMPRESSION:  1. No evidence of acute intracranial abnormality. 2. Advanced chronic small vessel ischemic disease. Electronically Signed   By: Logan Bores M.D.   On: 01/03/2018 19:05   US Carotid Bilateral  Result Date: 01/04/2018 CLINICAL DATA:  Syncope EXAM: BILATERAL CAROTID DUPLEX ULTRASOUND TECHNIQUE: Pearline Cables scale imaging, color Doppler and duplex ultrasound were performed of bilateral carotid and vertebral arteries in the neck. COMPARISON:  None. FINDINGS: Criteria: Quantification of carotid stenosis is based on velocity parameters that correlate the residual internal carotid diameter with NASCET-based stenosis levels, using the diameter of the distal internal carotid lumen as the denominator for stenosis measurement. The following velocity measurements were obtained: RIGHT ICA: 79 cm/sec CCA: 98 cm/sec SYSTOLIC ICA/CCA RATIO:  0.8 ECA: 80 cm/sec LEFT ICA: 102 cm/sec CCA: 95 cm/sec SYSTOLIC ICA/CCA RATIO:  1.1 ECA: 125 cm/sec RIGHT CAROTID ARTERY: Mild calcified plaque along the wall of the bulb. Low resistance internal carotid Doppler pattern. RIGHT VERTEBRAL ARTERY:  Antegrade. LEFT CAROTID ARTERY: There is mild smooth mixed plaque in the bulb. Low resistance internal carotid Doppler pattern. LEFT VERTEBRAL ARTERY:  Antegrade. IMPRESSION: Less than 50% stenosis in the right and left internal carotid arteries. Electronically Signed   By: Marybelle Killings M.D.   On: 01/04/2018 09:22   Dg Chest Port 1 View  Result Date: 01/03/2018 CLINICAL DATA:  Possible choking type event with syncope. EXAM: PORTABLE CHEST 1 VIEW COMPARISON:  04/02/2015 FINDINGS: The cardiomediastinal silhouette is unchanged with normal heart size. Aortic atherosclerosis is noted. There is slight elevation of the left hemidiaphragm. No airspace consolidation, edema, pleural effusion, or pneumothorax is identified. Right upper quadrant abdominal  surgical clips, thoracolumbar scoliosis, and a chronic L1 compression fracture are noted. IMPRESSION: No active disease. Electronically Signed   By: Logan Bores M.D.   On: 01/03/2018 19:08     ASSESSMENT AND PLAN:   * Syncope Could be due to dehydration Tele- No bradycardia or arrhythmias. Waiting for Echo Orthostats negative  * AKI Improved with IVF  * Hypertension.  Continue home hypertension medication.  * Diabetes.  Continue Lantus, NovoLog AC, start sliding scale.  * Anemia of chronic disease.  Stable.  All the records are reviewed and case discussed with Care Management/Social Worker Management plans discussed with the patient, family and they are in agreement.  CODE STATUS: Partial code  DVT Prophylaxis: SCDs  TOTAL TIME TAKING CARE OF THIS PATIENT: 30 minutes.   POSSIBLE D/C IN 1-2 DAYS, DEPENDING ON CLINICAL CONDITION.  Leia Alf Lovett Coffin M.D on 01/05/2018 at 7:43 AM  Between 7am to 6pm - Pager - [phone]  After 6pm go to [email or web address] - password [email or web address]  Office  [phone]  CC: Primary care physician; Kirk Ruths, MD  Note: This dictation was prepared with Dragon dictation along with smaller phrase technology. Any transcriptional errors that result from this process are unintentional.

## 2018-01-10 DIAGNOSIS — E1142 Type 2 diabetes mellitus with diabetic polyneuropathy: Secondary | ICD-10-CM | POA: Diagnosis not present

## 2018-01-10 DIAGNOSIS — I10 Essential (primary) hypertension: Secondary | ICD-10-CM | POA: Diagnosis not present

## 2018-01-10 DIAGNOSIS — E1159 Type 2 diabetes mellitus with other circulatory complications: Secondary | ICD-10-CM | POA: Diagnosis not present

## 2018-01-15 DIAGNOSIS — E1159 Type 2 diabetes mellitus with other circulatory complications: Secondary | ICD-10-CM | POA: Diagnosis not present

## 2018-01-15 DIAGNOSIS — E1142 Type 2 diabetes mellitus with diabetic polyneuropathy: Secondary | ICD-10-CM | POA: Diagnosis not present

## 2018-01-15 DIAGNOSIS — I10 Essential (primary) hypertension: Secondary | ICD-10-CM | POA: Diagnosis not present

## 2018-01-15 NOTE — Discharge Summary (Signed)
[facility] [place] NAME: Elizabeth Rivera    MR#:  [id number]  DATE OF BIRTH:  05/08/1950  DATE OF ADMISSION:  01/03/2018 ADMITTING PHYSICIAN: Demetrios Loll, MD  DATE OF DISCHARGE: 01/05/2018  2:57 PM  PRIMARY CARE PHYSICIAN: Kirk Ruths, MD   ADMISSION DIAGNOSIS:  Syncope and collapse [R55] Dehydration [E86.0] Elevated TSH [R79.89]  DISCHARGE DIAGNOSIS:  Active Problems:   ARF (acute renal failure) ([place])   SECONDARY DIAGNOSIS:   Past Medical History:  Diagnosis Date  . Anemia   . Anemia of other chronic disease 09/01/2013  . Anxiety   . Arthritis    back s/p fracture x3 approx 1983  . Candidal vaginitis   . Diabetes mellitus without complication ([place])   . Diverticulosis of colon (without mention of hemorrhage) 05/20/2009   Internal hemms (Dr. Vira Agar)  . Forearm fracture    2013, left  . GERD (gastroesophageal reflux disease)    reflux HH 09/01/2002//egd reflux Esoph. HH Gastritis nonbleeding erosive gastropathy Duodentitis 08/15/2006  . Hyperglycemia   . Hyperlipidemia   . Hypertension   . Insomnia, unspecified   . Irritable bowel syndrome    history of  . Nervous breakdown 08/2002   [facility]  . Neuropathy   . Other abnormal blood chemistry   . Other chest pain    chest discomfort  . Other esophagitis    erosive esophagitis  . Other specified disorders of adrenal glands 07/2007   adrenal mass via of CT 1.4cm left Adrenal no change(Dr.Cope)   . Other specified gastritis without mention of hemorrhage    erosvie gastritis  . Stroke ([place]) 03/2014  . Stroke ([place]) 03/2015  . Thyroid disease    hypothyroid, goiter  . Wears dentures    partial upper and lower.     ADMITTING HISTORY  HISTORY OF PRESENT ILLNESS:  Elizabeth Rivera  is a 67 y.o. female with a known history of anemia, anxiety, arthritis, hypertension, diabetes, hyperlipidemia, IBS, diverticulosis, neuropathy etc.  The patient presents the ED with  above chief complaint.  The patient was at dinner and started feeling not good.  She said she passed out but she cannot remember what happened.  According to her husband, she passed out for 1 minutes.  The patient also complains of generalized weakness but denies any focal weakness, numbness or tingling.  She denies any dysuria, hematuria or urgency.  CAT scan of the head is unremarkable.  Labs show acute renal failure with dehydration.  Dr. Jacqualine Code requested observation.   HOSPITAL COURSE:   *Syncope *Dehydration *Acute kidney injury *Hypertension *Diabetes mellitus type 2 *Anemia of chronic disease  Patient was admitted to medical floor with telemetry monitoring.  Troponins checked 3 times were negative.  Echocardiogram was checked and showed normal ejection fraction with no significant valvular abnormalities.  Orthostats were negative but by then patient had received fluids in the emergency room.  Her acute kidney injury had resolved.  Syncope is thought to be likely due to dehydration.  Patient is being discharged home to follow-up with primary care physician.  If she has recurrent syncopal episode will need cardiology evaluation and Holter monitor.  CONSULTS OBTAINED:    DRUG ALLERGIES:   Allergies  Allergen Reactions  . Sulfa Antibiotics Rash    And angioedmea    DISCHARGE MEDICATIONS:   Allergies as of 01/05/2018      Reactions   Sulfa Antibiotics Rash   And angioedmea  Medication List    TAKE these medications   ALPRAZolam 0.25 MG tablet Commonly known as:  XANAX Take 1 tablet (0.25 mg total) by mouth 2 (two) times daily.   amLODipine 10 MG tablet Commonly known as:  NORVASC Take 10 mg by mouth daily.   atorvastatin 40 MG tablet Commonly known as:  LIPITOR Take 1 tablet (40 mg total) by mouth daily at 6 PM.   cholecalciferol 1000 units tablet Commonly known as:  VITAMIN D Take 1,000 Units by mouth daily.   cloNIDine 0.1 MG tablet Commonly known as:   CATAPRES Take by mouth.   clopidogrel 75 MG tablet Commonly known as:  PLAVIX Take 1 tablet (75 mg total) by mouth daily.   CRANBERRY PO Take by mouth daily.   DEXILANT PO Take by mouth daily.   DULCOLAX 10 MG suppository Generic drug:  bisacodyl Place 10 mg rectally as needed for moderate constipation.   ferrous sulfate 325 (65 FE) MG tablet Take 1 tablet (325 mg total) by mouth daily with breakfast.   FIFTY50 GLUCOSE METER 2.0 w/Device Kit Use as directed. TRUE METRIX AIR E11.40   gabapentin 100 MG capsule Commonly known as:  NEURONTIN Take 200 mg by mouth 3 (three) times daily.   hydrochlorothiazide 25 MG tablet Commonly known as:  HYDRODIURIL Take 1 tablet (25 mg total) by mouth daily.   insulin aspart 100 UNIT/ML injection Commonly known as:  novoLOG Inject 5 Units into the skin 3 (three) times daily before meals.   insulin glargine 100 UNIT/ML injection Commonly known as:  LANTUS Inject 9 Units into the skin at bedtime.   levothyroxine 112 MCG tablet Commonly known as:  SYNTHROID, LEVOTHROID Take by mouth.   LINZESS PO Take by mouth daily.   lisinopril 40 MG tablet Commonly known as:  PRINIVIL,ZESTRIL Take 1 tablet (40 mg total) by mouth daily.   Melatonin 5 MG Tabs Take 5 mg by mouth at bedtime.   metFORMIN 1000 MG tablet Commonly known as:  GLUCOPHAGE Take 0.5 tablets (500 mg total) by mouth 2 (two) times daily with a meal.   metoprolol succinate 100 MG 24 hr tablet Commonly known as:  TOPROL-XL Take 1 tablet (100 mg total) by mouth daily. Take with or immediately following a meal.   mirabegron ER 25 MG Tb24 tablet Commonly known as:  MYRBETRIQ Take 1 tablet (25 mg total) by mouth daily.   mirtazapine 7.5 MG tablet Commonly known as:  REMERON Take by mouth.   pantoprazole 40 MG tablet Commonly known as:  PROTONIX Take 1 tablet (40 mg total) by mouth 2 (two) times daily.   ranitidine 300 MG tablet Commonly known as:  ZANTAC Take 1  tablet (300 mg total) by mouth daily.   venlafaxine XR 150 MG 24 hr capsule Commonly known as:  EFFEXOR-XR Take 1 capsule (150 mg total) by mouth 2 (two) times daily.   vitamin B-12 1000 MCG tablet Commonly known as:  CYANOCOBALAMIN Take 1,000 mcg by mouth daily.       Today   VITAL SIGNS:  Blood pressure (!) 116/57, pulse 70, temperature 98.1 F (36.7 C), temperature source Oral, resp. rate 16, height 5' 2" (1.575 m), weight 58.1 kg, SpO2 100 %.  I/O:  No intake or output data in the 24 hours ending 01/15/18 1448  PHYSICAL EXAMINATION:  Physical Exam  GENERAL:  67 y.o.-year-old patient lying in the bed with no acute distress.  LUNGS: Normal breath sounds bilaterally, no wheezing, rales,rhonchi or crepitation.   No use of accessory muscles of respiration.  CARDIOVASCULAR: S1, S2 normal. No murmurs, rubs, or gallops.  ABDOMEN: Soft, non-tender, non-distended. Bowel sounds present. No organomegaly or mass.  NEUROLOGIC: Moves all 4 extremities. PSYCHIATRIC: The patient is alert and oriented x 3.  SKIN: No obvious rash, lesion, or ulcer.   DATA REVIEW:   CBC No results for input(s): WBC, HGB, HCT, PLT in the last 168 hours.  Chemistries  No results for input(s): NA, K, CL, CO2, GLUCOSE, BUN, CREATININE, CALCIUM, MG, AST, ALT, ALKPHOS, BILITOT in the last 168 hours.  Invalid input(s): GFRCGP  Cardiac Enzymes No results for input(s): TROPONINI in the last 168 hours.  Microbiology Results  Results for orders placed or performed in visit on 07/13/16  Microscopic Examination     Status: Abnormal   Collection Time: 07/13/16 11:40 AM  Result Value Ref Range Status   WBC, UA 0-5 0 - 5 /hpf Final   RBC, UA None seen 0 - 2 /hpf Final   Epithelial Cells (non renal) 0-10 0 - 10 /hpf Final   Mucus, UA Present (A) Not Estab. Final   Bacteria, UA None seen None seen/Few Final    RADIOLOGY:  No results found.  Follow up with PCP in 1 week.  Management plans discussed with the  patient, family and they are in agreement.  CODE STATUS:  Code Status History    Date Active Date Inactive Code Status Order ID Comments User Context   01/03/2018 2143 01/05/2018 1802 Partial Code [id number]  Chen, Qing, MD Inpatient   04/02/2015 2032 04/03/2015 1620 Full Code [id number]  Sudini, Srikar, MD ED   03/11/2015 2336 03/16/2015 2050 Full Code [id number]  Willis, David, MD Inpatient    Questions for Most Recent Historical Code Status (Order [id number])    Question Answer Comment   In the event of cardiac or respiratory ARREST: Initiate Code Blue, Call Rapid Response Yes    In the event of cardiac or respiratory ARREST: Perform CPR Yes    In the event of cardiac or respiratory ARREST: Perform Intubation/Mechanical Ventilation No    In the event of cardiac or respiratory ARREST: Use NIPPV/BiPAp only if indicated Yes    In the event of cardiac or respiratory ARREST: Administer ACLS medications if indicated Yes    In the event of cardiac or respiratory ARREST: Perform Defibrillation or Cardioversion if indicated No         Advance Directive Documentation     Most Recent Value  Type of Advance Directive  Living will, Healthcare Power of Attorney  Pre-existing out of facility DNR order (yellow form or pink MOST form)  -  "MOST" Form in Place?  -      TOTAL TIME TAKING CARE OF THIS PATIENT ON DAY OF DISCHARGE: more than 30 minutes.   Srikar R Sudini M.D on 01/15/2018 at 2:48 PM  Between 7am to 6pm - Pager - [phone]  After 6pm go to [email or web address] - password [email or web address]  [facility]  Office  [phone]  CC: Primary care physician; Anderson, Marshall W, MD  Note: This dictation was prepared with Dragon dictation along with smaller phrase technology. Any transcriptional errors that result from this process are unintentional.   

## 2018-01-24 ENCOUNTER — Telehealth: Payer: Self-pay | Admitting: Urology

## 2018-01-24 NOTE — Telephone Encounter (Signed)
Pt states she has been taking 25mg  Myrbetriq and was told that eventually she would need 50mg . Pt states she is now at the point that she needs 50mg  myrbetriq and wishes to get a Rx sent to [facility] 90 days.  Please advise. Thanks.

## 2018-01-25 NOTE — Telephone Encounter (Signed)
She was last seen in our office by Dr. Pilar Jarvis in 07/2016.  She will need an office visit prior to prescribing any medication.  She may want to check with her PCP to see if they are willing to prescribe the Myrbetriq as we won't have an appointment available until after the new year.

## 2018-01-25 NOTE — Telephone Encounter (Signed)
Spoke with pt and informed her of Shannon's message. Per pt she has an upcomming appt this month with her pcp and she will check with them first. She had no additional questions at this time. Nothing further is needed.

## 2018-01-31 DIAGNOSIS — F325 Major depressive disorder, single episode, in full remission: Secondary | ICD-10-CM | POA: Diagnosis not present

## 2018-01-31 DIAGNOSIS — E1159 Type 2 diabetes mellitus with other circulatory complications: Secondary | ICD-10-CM | POA: Diagnosis not present

## 2018-01-31 DIAGNOSIS — I10 Essential (primary) hypertension: Secondary | ICD-10-CM | POA: Diagnosis not present

## 2018-01-31 DIAGNOSIS — E1169 Type 2 diabetes mellitus with other specified complication: Secondary | ICD-10-CM | POA: Diagnosis not present

## 2018-01-31 DIAGNOSIS — E039 Hypothyroidism, unspecified: Secondary | ICD-10-CM | POA: Diagnosis not present

## 2018-01-31 DIAGNOSIS — Z9989 Dependence on other enabling machines and devices: Secondary | ICD-10-CM | POA: Diagnosis not present

## 2018-01-31 DIAGNOSIS — E1142 Type 2 diabetes mellitus with diabetic polyneuropathy: Secondary | ICD-10-CM | POA: Diagnosis not present

## 2018-01-31 DIAGNOSIS — E785 Hyperlipidemia, unspecified: Secondary | ICD-10-CM | POA: Diagnosis not present

## 2018-02-18 DIAGNOSIS — M1710 Unilateral primary osteoarthritis, unspecified knee: Secondary | ICD-10-CM | POA: Insufficient documentation

## 2018-02-18 DIAGNOSIS — M4724 Other spondylosis with radiculopathy, thoracic region: Secondary | ICD-10-CM | POA: Insufficient documentation

## 2018-02-18 DIAGNOSIS — M2240 Chondromalacia patellae, unspecified knee: Secondary | ICD-10-CM | POA: Insufficient documentation

## 2018-02-18 DIAGNOSIS — M179 Osteoarthritis of knee, unspecified: Secondary | ICD-10-CM | POA: Insufficient documentation

## 2018-02-19 ENCOUNTER — Ambulatory Visit: Payer: Medicare HMO | Admitting: Gastroenterology

## 2018-02-19 ENCOUNTER — Other Ambulatory Visit: Payer: Self-pay

## 2018-02-19 ENCOUNTER — Encounter: Payer: Self-pay | Admitting: Gastroenterology

## 2018-02-19 VITALS — BP 204/85 | HR 73 | Ht 63.0 in | Wt 129.0 lb

## 2018-02-19 DIAGNOSIS — Z860100 Personal history of colon polyps, unspecified: Secondary | ICD-10-CM

## 2018-02-19 DIAGNOSIS — R194 Change in bowel habit: Secondary | ICD-10-CM | POA: Diagnosis not present

## 2018-02-19 DIAGNOSIS — K5909 Other constipation: Secondary | ICD-10-CM

## 2018-02-19 MED ORDER — NA SULFATE-K SULFATE-MG SULF 17.5-3.13-1.6 GM/177ML PO SOLN: 1.0000 | ORAL | 0 refills | Status: DC

## 2018-02-19 NOTE — Progress Notes (Signed)
su

## 2018-02-19 NOTE — Progress Notes (Signed)
Primary Care Physician: Kirk Ruths, MD  Primary Gastroenterologist:  Dr. Lucilla Lame  Chief Complaint  Patient presents with  . follow up constipation  . follow up dysphagia    HPI: Elizabeth Rivera is a 68 y.o. female here for follow-up of her bowel problems. The patient reports that she continues to have a trouble passing soft stools and has to use a large amount of toilet paper to clean herself since the stools are pasty.  She has tried Linzess in the past which she states caused her to have explosive diarrhea.  There is no report of any black stools or bloody stools.  She also reports that she is due for colonoscopy since her last colonoscopy was in 2015 and she had polyps at that time.  Current Outpatient Medications  Medication Sig Dispense Refill  . ALPRAZolam (XANAX) 0.25 MG tablet Take 1 tablet (0.25 mg total) by mouth 2 (two) times daily. 30 tablet 0  . amLODipine (NORVASC) 10 MG tablet Take 10 mg by mouth daily.    Marland Kitchen atorvastatin (LIPITOR) 40 MG tablet Take 1 tablet (40 mg total) by mouth daily at 6 PM. 90 tablet 3  . bisacodyl (DULCOLAX) 10 MG suppository Place 10 mg rectally as needed for moderate constipation.    . Blood Glucose Monitoring Suppl (FIFTY50 GLUCOSE METER 2.0) w/Device KIT Use as directed. TRUE METRIX AIR E11.40    . cholecalciferol (VITAMIN D) 1000 units tablet Take 1,000 Units by mouth daily.    . cloNIDine (CATAPRES) 0.1 MG tablet Take by mouth.    . clopidogrel (PLAVIX) 75 MG tablet Take 1 tablet (75 mg total) by mouth daily. 90 tablet 1  . CRANBERRY PO Take by mouth daily.    . ferrous sulfate ([employer] IRON) 325 (65 FE) MG tablet Take 1 tablet (325 mg total) by mouth daily with breakfast.    . gabapentin (NEURONTIN) 100 MG capsule Take 200 mg by mouth 3 (three) times daily.     . hydrochlorothiazide (HYDRODIURIL) 25 MG tablet Take 1 tablet (25 mg total) by mouth daily. 90 tablet 1  . insulin aspart (NOVOLOG) 100 UNIT/ML injection Inject 5 Units into  the skin 3 (three) times daily before meals.    . insulin glargine (LANTUS) 100 UNIT/ML injection Inject 9 Units into the skin at bedtime.    Marland Kitchen levothyroxine (SYNTHROID, LEVOTHROID) 112 MCG tablet Take by mouth.    Marland Kitchen lisinopril (PRINIVIL,ZESTRIL) 40 MG tablet Take 1 tablet (40 mg total) by mouth daily. 90 tablet 0  . Melatonin 5 MG TABS Take 5 mg by mouth at bedtime.    . metFORMIN (GLUCOPHAGE) 1000 MG tablet Take 0.5 tablets (500 mg total) by mouth 2 (two) times daily with a meal. 90 tablet 3  . mirabegron ER (MYRBETRIQ) 25 MG TB24 tablet Take 1 tablet (25 mg total) by mouth daily. 30 tablet 11  . pantoprazole (PROTONIX) 40 MG tablet Take 1 tablet (40 mg total) by mouth 2 (two) times daily. 180 tablet 1  . venlafaxine XR (EFFEXOR-XR) 150 MG 24 hr capsule Take 1 capsule (150 mg total) by mouth 2 (two) times daily. 180 capsule 3  . vitamin B-12 (CYANOCOBALAMIN) 1000 MCG tablet Take 1,000 mcg by mouth daily.     . Linaclotide (LINZESS PO) Take by mouth daily.    . metoprolol succinate (TOPROL-XL) 100 MG 24 hr tablet Take 1 tablet (100 mg total) by mouth daily. Take with or immediately following a meal. (Patient not taking:  Reported on 01/03/2018) 90 tablet 3  . mirtazapine (REMERON) 7.5 MG tablet Take by mouth.    . Na Sulfate-K Sulfate-Mg Sulf (SUPREP BOWEL PREP KIT) 17.5-3.13-1.6 GM/177ML SOLN Take 1 kit by mouth as directed. 1 Bottle 0  . ranitidine (ZANTAC) 300 MG tablet Take 1 tablet (300 mg total) by mouth daily. (Patient not taking: Reported on 01/03/2018) 90 tablet 3   No current facility-administered medications for this visit.     Allergies as of 02/19/2018 - Review Complete 02/19/2018  Allergen Reaction Noted  . Sulfa antibiotics Rash and Anaphylaxis 07/08/2012    ROS:  General: Negative for anorexia, weight loss, fever, chills, fatigue, weakness. ENT: Negative for hoarseness, difficulty swallowing , nasal congestion. CV: Negative for chest pain, angina, palpitations, dyspnea on  exertion, peripheral edema.  Respiratory: Negative for dyspnea at rest, dyspnea on exertion, cough, sputum, wheezing.  GI: See history of present illness. GU:  Negative for dysuria, hematuria, urinary incontinence, urinary frequency, nocturnal urination.  Endo: Negative for unusual weight change.    Physical Examination:   BP (!) 204/85   Pulse 73   Ht _0  (1.6 m)   Wt 129 lb (58.5 kg)   BMI 22.85 kg/m   General: Well-nourished, well-developed in no acute distress.  Eyes: No icterus. Conjunctivae pink. Mouth: Oropharyngeal mucosa moist and pink , no lesions erythema or exudate. Lungs: Clear to auscultation bilaterally. Non-labored. Heart: Regular rate and rhythm, no murmurs rubs or gallops.  Abdomen: Bowel sounds are normal, nontender, nondistended, no hepatosplenomegaly or masses, no abdominal bruits or hernia , no rebound or guarding.   Extremities: No lower extremity edema. No clubbing or deformities. Neuro: Alert and oriented x 3.  Grossly intact. Skin: Warm and dry, no jaundice.   Psych: Alert and cooperative, normal mood and affect.  Labs:    Imaging Studies: No results found.  Assessment and Plan:   Elizabeth Rivera is a 68 y.o. y/o female who comes in today with a history of chronic bowel problems. The patient has been told to take fiber to bulk up her stools and help them more easily pass. The patient will also be set up for a colonoscopy due to her history of colon polyps. I have discussed risks & benefits which include, but are not limited to, bleeding, infection, perforation & drug reaction.  The patient agrees with this plan & written consent will be obtained.       Lucilla Lame, MD. Mar[facility] Regal   Note: This dictation was prepared with Dragon dictation along with smaller phrase technology. Any transcriptional errors that result from this process are unintentional.

## 2018-02-25 DIAGNOSIS — E083292 Diabetes mellitus due to underlying condition with mild nonproliferative diabetic retinopathy without macular edema, left eye: Secondary | ICD-10-CM | POA: Diagnosis not present

## 2018-02-25 DIAGNOSIS — H2513 Age-related nuclear cataract, bilateral: Secondary | ICD-10-CM | POA: Diagnosis not present

## 2018-02-25 DIAGNOSIS — E113292 Type 2 diabetes mellitus with mild nonproliferative diabetic retinopathy without macular edema, left eye: Secondary | ICD-10-CM | POA: Diagnosis not present

## 2018-02-25 DIAGNOSIS — H524 Presbyopia: Secondary | ICD-10-CM | POA: Diagnosis not present

## 2018-02-27 DIAGNOSIS — I208 Other forms of angina pectoris: Secondary | ICD-10-CM | POA: Diagnosis not present

## 2018-02-27 DIAGNOSIS — I701 Atherosclerosis of renal artery: Secondary | ICD-10-CM | POA: Diagnosis not present

## 2018-02-27 DIAGNOSIS — I951 Orthostatic hypotension: Secondary | ICD-10-CM | POA: Diagnosis not present

## 2018-02-27 DIAGNOSIS — R55 Syncope and collapse: Secondary | ICD-10-CM | POA: Diagnosis not present

## 2018-02-27 DIAGNOSIS — H52209 Unspecified astigmatism, unspecified eye: Secondary | ICD-10-CM | POA: Diagnosis not present

## 2018-02-27 DIAGNOSIS — K219 Gastro-esophageal reflux disease without esophagitis: Secondary | ICD-10-CM | POA: Diagnosis not present

## 2018-02-27 DIAGNOSIS — I2510 Atherosclerotic heart disease of native coronary artery without angina pectoris: Secondary | ICD-10-CM | POA: Diagnosis not present

## 2018-02-27 DIAGNOSIS — E7849 Other hyperlipidemia: Secondary | ICD-10-CM | POA: Diagnosis not present

## 2018-02-27 DIAGNOSIS — H524 Presbyopia: Secondary | ICD-10-CM | POA: Diagnosis not present

## 2018-02-27 DIAGNOSIS — H5203 Hypermetropia, bilateral: Secondary | ICD-10-CM | POA: Diagnosis not present

## 2018-02-27 DIAGNOSIS — I639 Cerebral infarction, unspecified: Secondary | ICD-10-CM | POA: Diagnosis not present

## 2018-02-27 DIAGNOSIS — I10 Essential (primary) hypertension: Secondary | ICD-10-CM | POA: Diagnosis not present

## 2018-03-04 ENCOUNTER — Telehealth: Payer: Self-pay | Admitting: Gastroenterology

## 2018-03-04 ENCOUNTER — Encounter: Payer: Self-pay | Admitting: *Deleted

## 2018-03-04 NOTE — Telephone Encounter (Signed)
Advised pt we normally ask pt's to contact their PCP if their sugars get too high or low. Advised her to adjust her diabetic meds today based on her sugar levels.

## 2018-03-04 NOTE — Telephone Encounter (Signed)
Patient called stating she has a colonoscopy 03-05-18 & her sugar has dropped.What does she need to do?

## 2018-03-05 ENCOUNTER — Encounter
Admission: RE | Disposition: A | Discharge status: Home / Self Care | Payer: Self-pay | Source: Home / Self Care | Attending: Gastroenterology

## 2018-03-05 ENCOUNTER — Encounter: Payer: Self-pay | Admitting: *Deleted

## 2018-03-05 ENCOUNTER — Ambulatory Visit: Payer: Medicare HMO | Admitting: Certified Registered"

## 2018-03-05 ENCOUNTER — Ambulatory Visit
Admission: RE | Admit: 2018-03-05 | Discharge: 2018-03-05 | Disposition: A | Discharge status: Home / Self Care | Payer: Medicare HMO | Attending: Gastroenterology | Admitting: Gastroenterology

## 2018-03-05 DIAGNOSIS — E039 Hypothyroidism, unspecified: Secondary | ICD-10-CM | POA: Insufficient documentation

## 2018-03-05 DIAGNOSIS — Z79890 Hormone replacement therapy (postmenopausal): Secondary | ICD-10-CM | POA: Insufficient documentation

## 2018-03-05 DIAGNOSIS — E782 Mixed hyperlipidemia: Secondary | ICD-10-CM | POA: Diagnosis not present

## 2018-03-05 DIAGNOSIS — Z79899 Other long term (current) drug therapy: Secondary | ICD-10-CM | POA: Insufficient documentation

## 2018-03-05 DIAGNOSIS — Z7902 Long term (current) use of antithrombotics/antiplatelets: Secondary | ICD-10-CM | POA: Insufficient documentation

## 2018-03-05 DIAGNOSIS — Z1211 Encounter for screening for malignant neoplasm of colon: Secondary | ICD-10-CM | POA: Insufficient documentation

## 2018-03-05 DIAGNOSIS — Z860100 Personal history of colon polyps, unspecified: Secondary | ICD-10-CM

## 2018-03-05 DIAGNOSIS — F419 Anxiety disorder, unspecified: Secondary | ICD-10-CM | POA: Diagnosis not present

## 2018-03-05 DIAGNOSIS — K635 Polyp of colon: Secondary | ICD-10-CM | POA: Diagnosis not present

## 2018-03-05 DIAGNOSIS — K5730 Diverticulosis of large intestine without perforation or abscess without bleeding: Secondary | ICD-10-CM | POA: Insufficient documentation

## 2018-03-05 DIAGNOSIS — K219 Gastro-esophageal reflux disease without esophagitis: Secondary | ICD-10-CM | POA: Insufficient documentation

## 2018-03-05 DIAGNOSIS — F329 Major depressive disorder, single episode, unspecified: Secondary | ICD-10-CM | POA: Insufficient documentation

## 2018-03-05 DIAGNOSIS — Z8673 Personal history of transient ischemic attack (TIA), and cerebral infarction without residual deficits: Secondary | ICD-10-CM | POA: Diagnosis not present

## 2018-03-05 DIAGNOSIS — D638 Anemia in other chronic diseases classified elsewhere: Secondary | ICD-10-CM | POA: Insufficient documentation

## 2018-03-05 DIAGNOSIS — F418 Other specified anxiety disorders: Secondary | ICD-10-CM | POA: Diagnosis not present

## 2018-03-05 DIAGNOSIS — E1140 Type 2 diabetes mellitus with diabetic neuropathy, unspecified: Secondary | ICD-10-CM | POA: Diagnosis not present

## 2018-03-05 DIAGNOSIS — Z794 Long term (current) use of insulin: Secondary | ICD-10-CM | POA: Insufficient documentation

## 2018-03-05 DIAGNOSIS — D123 Benign neoplasm of transverse colon: Secondary | ICD-10-CM

## 2018-03-05 DIAGNOSIS — E785 Hyperlipidemia, unspecified: Secondary | ICD-10-CM | POA: Insufficient documentation

## 2018-03-05 DIAGNOSIS — I10 Essential (primary) hypertension: Secondary | ICD-10-CM | POA: Diagnosis not present

## 2018-03-05 DIAGNOSIS — K641 Second degree hemorrhoids: Secondary | ICD-10-CM | POA: Diagnosis not present

## 2018-03-05 HISTORY — PX: COLONOSCOPY WITH PROPOFOL: SHX5780

## 2018-03-05 LAB — GLUCOSE, CAPILLARY: Glucose-Capillary: 146 mg/dL — ABNORMAL HIGH (ref 70–99)

## 2018-03-05 SURGERY — COLONOSCOPY WITH PROPOFOL
Anesthesia: General

## 2018-03-05 MED ORDER — SODIUM CHLORIDE 0.9 % IV SOLN
INTRAVENOUS | Status: DC
  Administered 2018-03-05: 1999 mL via INTRAVENOUS

## 2018-03-05 MED ORDER — PROPOFOL 500 MG/50ML IV EMUL
INTRAVENOUS | Status: DC | PRN
  Administered 2018-03-05: 150 ug/kg/min via INTRAVENOUS

## 2018-03-05 MED ORDER — PROPOFOL 10 MG/ML IV BOLUS
INTRAVENOUS | Status: DC | PRN
  Administered 2018-03-05: 40 mg via INTRAVENOUS

## 2018-03-05 MED ORDER — PROPOFOL 10 MG/ML IV BOLUS
INTRAVENOUS | Status: AC
  Filled 2018-03-05: qty 40

## 2018-03-05 NOTE — Anesthesia Preprocedure Evaluation (Signed)
Anesthesia Evaluation  Patient identified by MRN, date of birth, ID band Patient awake    Reviewed: Allergy & Precautions, NPO status , Patient's Chart, lab work & pertinent test results  History of Anesthesia Complications Negative for: history of anesthetic complications  Airway Mallampati: II  TM Distance: >3 FB Neck ROM: Full    Dental no notable dental hx.    Pulmonary neg pulmonary ROS, neg sleep apnea, neg COPD,    breath sounds clear to auscultation- rhonchi (-) wheezing      Cardiovascular hypertension, Pt. on medications (-) CAD, (-) Past MI, (-) Cardiac Stents and (-) CABG  Rhythm:Regular Rate:Normal - Systolic murmurs and - Diastolic murmurs    Neuro/Psych PSYCHIATRIC DISORDERS Anxiety Depression CVA (L sided [place]ess)    GI/Hepatic Neg liver ROS, GERD  ,  Endo/Other  diabetes, Insulin DependentHypothyroidism   Renal/GU Renal InsufficiencyRenal disease     Musculoskeletal  (+) Arthritis ,   Abdominal (+) - obese,   Peds  Hematology  (+) anemia ,   Anesthesia Other Findings Past Medical History: No date: Anemia 09/01/2013: Anemia of other chronic disease No date: Anxiety No date: Arthritis     Comment:  back s/p fracture x3 approx 1983 No date: Candidal vaginitis No date: Diabetes mellitus without complication ([place]) 47/82/9562: Diverticulosis of colon (without mention of hemorrhage)     Comment:  Internal hemms (Dr. Vira Agar) No date: Forearm fracture     Comment:  2013, left No date: GERD (gastroesophageal reflux disease)     Comment:  reflux HH 09/01/2002//egd reflux Esoph. HH Gastritis               nonbleeding erosive gastropathy Duodentitis 08/15/2006 No date: Hyperglycemia No date: Hyperlipidemia No date: Hypertension No date: Insomnia, unspecified No date: Irritable bowel syndrome     Comment:  history of 08/2002: Nervous breakdown     Comment:  Hospital [facility] No date: Neuropathy No  date: Other abnormal blood chemistry No date: Other chest pain     Comment:  chest discomfort No date: Other esophagitis     Comment:  erosive esophagitis 07/2007: Other specified disorders of adrenal glands     Comment:  adrenal mass via of CT 1.4cm left Adrenal no               change(Dr.Cope)  No date: Other specified gastritis without mention of hemorrhage     Comment:  erosvie gastritis 03/2014: Stroke ([place]) 03/2015: Stroke ([place]) No date: Thyroid disease     Comment:  hypothyroid, goiter No date: Wears dentures     Comment:  partial upper and lower.   Reproductive/Obstetrics                             Anesthesia Physical Anesthesia Plan  ASA: III  Anesthesia Plan: General   Post-op Pain Management:    Induction: Intravenous  PONV Risk Score and Plan: 2 and Propofol infusion  Airway Management Planned: Natural Airway  Additional Equipment:   Intra-op Plan:   Post-operative Plan:   Informed Consent: I have reviewed the patients History and Physical, chart, labs and discussed the procedure including the risks, benefits and alternatives for the proposed anesthesia with the patient or authorized representative who has indicated his/her understanding and acceptance.     Dental advisory given  Plan Discussed with: CRNA and Anesthesiologist  Anesthesia Plan Comments:         Anesthesia Quick Evaluation

## 2018-03-05 NOTE — Anesthesia Post-op Follow-up Note (Signed)
Anesthesia QCDR form completed.        

## 2018-03-05 NOTE — H&P (Signed)
Lucilla Lame, MD [address] [place], [place] [address] [phone] Fax : [phone]  Primary Care Physician:  Kirk Ruths, MD Primary Gastroenterologist:  Dr. Allen Norris  Pre-Procedure History & Physical: HPI:  Elizabeth Rivera is a 68 y.o. female is here for an colonoscopy.   Past Medical History:  Diagnosis Date  . Anemia   . Anemia of other chronic disease 09/01/2013  . Anxiety   . Arthritis    back s/p fracture x3 approx 1983  . Candidal vaginitis   . Diabetes mellitus without complication ([place])   . Diverticulosis of colon (without mention of hemorrhage) 05/20/2009   Internal hemms (Dr. Vira Agar)  . Forearm fracture    2013, left  . GERD (gastroesophageal reflux disease)    reflux HH 09/01/2002//egd reflux Esoph. HH Gastritis nonbleeding erosive gastropathy Duodentitis 08/15/2006  . Hyperglycemia   . Hyperlipidemia   . Hypertension   . Insomnia, unspecified   . Irritable bowel syndrome    history of  . Nervous breakdown 08/2002   [facility]  . Neuropathy   . Other abnormal blood chemistry   . Other chest pain    chest discomfort  . Other esophagitis    erosive esophagitis  . Other specified disorders of adrenal glands 07/2007   adrenal mass via of CT 1.4cm left Adrenal no change(Dr.Cope)   . Other specified gastritis without mention of hemorrhage    erosvie gastritis  . Stroke ([place]) 03/2014  . Stroke ([place]) 03/2015  . Thyroid disease    hypothyroid, goiter  . Wears dentures    partial upper and lower.    Past Surgical History:  Procedure Laterality Date  . ABDOMINAL HYSTERECTOMY  1995   Part Hyst BSO DUB ovarian cyst B9  . BREAST CYST ASPIRATION Bilateral   . BREAST REDUCTION SURGERY  1984  . CHOLECYSTECTOMY  03/2004  . COLONOSCOPY  1. 08/15/06  2. 05/20/09   Int Hemms  2. Divertics Int Hemms (Dr. Vira Agar)  . CT ABD W & PELVIS WO CM  6/09   CT Scan abd stable adrenal adenoma 1.4cm left adrenal no change (Dr. Jacqlyn Larsen)  .  CYSTOSCOPY  01/08/2008   Former site of inflammation healed (Dr. Jacqlyn Larsen)  . ESOPHAGOGASTRODUODENOSCOPY  1. 09/01/02  2. 08/15/06   1. Reflux, HH  2. Reflux esoph HH gaastritis nonbleeding erosive gastropathy duodenitis  . ESOPHAGOGASTRODUODENOSCOPY (EGD) WITH PROPOFOL N/A 06/01/2016   Procedure: ESOPHAGOGASTRODUODENOSCOPY (EGD) WITH PROPOFOL;  Surgeon: Lucilla Lame, MD;  Location: [facility];  Service: Endoscopy;  Laterality: N/A;  diabetic - insulin and oral meds  . NSVD x1  1977  . REDUCTION MAMMAPLASTY  1985  . VAGINAL DELIVERY  1977    Prior to Admission medications   Medication Sig Start Date End Date Taking? Authorizing Provider  ALPRAZolam (XANAX) 0.25 MG tablet Take 1 tablet (0.25 mg total) by mouth 2 (two) times daily. 03/15/15  Yes Mody, Ulice Bold, MD  gabapentin (NEURONTIN) 100 MG capsule Take 200 mg by mouth 3 (three) times daily.    Yes [provider]  levothyroxine (SYNTHROID, LEVOTHROID) 112 MCG tablet Take by mouth. 02/25/16  Yes [provider]  atorvastatin (LIPITOR) 40 MG tablet Take 1 tablet (40 mg total) by mouth daily at 6 PM. 04/21/15   Tonia Ghent, MD  bisacodyl (DULCOLAX) 10 MG suppository Place 10 mg rectally as needed for moderate constipation.    [provider]  Blood Glucose Monitoring Suppl (FIFTY50 GLUCOSE METER 2.0) w/Device KIT  Use as directed. TRUE METRIX AIR E11.40 03/20/16   [provider]  cholecalciferol (VITAMIN D) 1000 units tablet Take 1,000 Units by mouth daily.    [provider]  cloNIDine (CATAPRES) 0.1 MG tablet Take by mouth. 03/16/16   [provider]  clopidogrel (PLAVIX) 75 MG tablet Take 1 tablet (75 mg total) by mouth daily. 04/22/15   Tonia Ghent, MD  CRANBERRY PO Take by mouth daily.    [provider]  ferrous sulfate ([employer] IRON) 325 (65 FE) MG tablet Take 1 tablet (325 mg total) by mouth daily with breakfast. 04/08/15   Tonia Ghent, MD  hydrochlorothiazide (HYDRODIURIL)  25 MG tablet Take 1 tablet (25 mg total) by mouth daily. 04/22/15   Tonia Ghent, MD  insulin aspart (NOVOLOG) 100 UNIT/ML injection Inject 5 Units into the skin 3 (three) times daily before meals.    [provider]  insulin glargine (LANTUS) 100 UNIT/ML injection Inject 9 Units into the skin at bedtime.    [provider]  lisinopril (PRINIVIL,ZESTRIL) 40 MG tablet Take 1 tablet (40 mg total) by mouth daily. 04/27/15   Tonia Ghent, MD  Melatonin 5 MG TABS Take 5 mg by mouth at bedtime.    [provider]  metFORMIN (GLUCOPHAGE) 1000 MG tablet Take 0.5 tablets (500 mg total) by mouth 2 (two) times daily with a meal. 02/09/15   Tonia Ghent, MD  metoprolol succinate (TOPROL-XL) 100 MG 24 hr tablet Take 1 tablet (100 mg total) by mouth daily. Take with or immediately following a meal. Patient not taking: Reported on 01/03/2018 06/23/15   Tonia Ghent, MD  mirabegron ER (MYRBETRIQ) 25 MG TB24 tablet Take 1 tablet (25 mg total) by mouth daily. 09/02/15   Nickie Retort, MD  mirtazapine (REMERON) 7.5 MG tablet Take by mouth. 04/20/16 01/03/18  [provider]  Na Sulfate-K Sulfate-Mg Sulf (SUPREP BOWEL PREP KIT) 17.5-3.13-1.6 GM/177ML SOLN Take 1 kit by mouth as directed. 02/19/18   Lucilla Lame, MD  pantoprazole (PROTONIX) 40 MG tablet Take 1 tablet (40 mg total) by mouth 2 (two) times daily. 04/25/15   Tonia Ghent, MD  venlafaxine XR (EFFEXOR-XR) 150 MG 24 hr capsule Take 1 capsule (150 mg total) by mouth 2 (two) times daily. 01/08/15   Tonia Ghent, MD  vitamin B-12 (CYANOCOBALAMIN) 1000 MCG tablet Take 1,000 mcg by mouth daily.     [provider]    Allergies as of 02/20/2018 - Review Complete 02/19/2018  Allergen Reaction Noted  . Sulfa antibiotics Rash and Anaphylaxis 07/08/2012    Family History  Problem Relation Age of Onset  . Hypertension Mother   . Hyperlipidemia Mother   . Stroke Mother        42  . Vision loss  Father        vision problems  . Cancer Father        Colon   . Hyperlipidemia Sister   . Hypertension Sister   . Vision loss Sister   . Breast cancer Maternal Grandmother 32  . Breast cancer Cousin        paternal cousin  . Prostate cancer Paternal Grandfather   . Bladder Cancer Neg Hx   . Kidney cancer Neg Hx     Social History   Socioeconomic History  . Marital status: Married    Spouse name: Not on file  . Number of children: Not on file  . Years of education: Not  on file  . Highest education level: Not on file  Occupational History  . Not on file  Social Needs  . Financial resource strain: Not on file  . Food insecurity:    Worry: Not on file    Inability: Not on file  . Transportation needs:    Medical: Not on file    Non-medical: Not on file  Tobacco Use  . Smoking status: Never Smoker  . Smokeless tobacco: Never Used  Substance and Sexual Activity  . Alcohol use: No    Alcohol/week: 0.0 standard drinks  . Drug use: No  . Sexual activity: Never  Lifestyle  . Physical activity:    Days per week: Not on file    Minutes per session: Not on file  . Stress: Not on file  Relationships  . Social connections:    Talks on phone: Not on file    Gets together: Not on file    Attends religious service: Not on file    Active member of club or organization: Not on file    Attends meetings of clubs or organizations: Not on file    Relationship status: Not on file  . Intimate partner violence:    Fear of current or ex partner: Not on file    Emotionally abused: Not on file    Physically abused: Not on file    Forced sexual activity: Not on file  Other Topics Concern  . Not on file  Social History Narrative   No regular exercise.   Worked at [employer] in [place]   Her elderly mother had a CVA and moved in with patient    Review of Systems: See HPI, otherwise negative ROS  Physical Exam: BP (!) 159/85   Pulse 77   Temp (!) 97.1 F (36.2 C) (Tympanic)    Resp 18   Ht _0  (1.6 m)   Wt 59 kg   SpO2 96%   BMI 23.03 kg/m  General:   Alert,  pleasant and cooperative in NAD Head:  Normocephalic and atraumatic. Neck:  Supple; no masses or thyromegaly. Lungs:  Clear throughout to auscultation.    Heart:  Regular rate and rhythm. Abdomen:  Soft, nontender and nondistended. Normal bowel sounds, without guarding, and without rebound.   Neurologic:  Alert and  oriented x4;  grossly normal neurologically.  Impression/Plan: Elizabeth Rivera is here for an colonoscopy to be performed for history of colon polyps 08/11/13  Risks, benefits, limitations, and alternatives regarding  colonoscopy have been reviewed with the patient.  Questions have been answered.  All parties agreeable.   Lucilla Lame, MD  03/05/2018, 9:48 AM

## 2018-03-05 NOTE — Transfer of Care (Signed)
Immediate Anesthesia Transfer of Care Note  Patient: Elizabeth Rivera  Procedure(s) Performed: COLONOSCOPY WITH PROPOFOL (N/A )  Patient Location: Endoscopy Unit  Anesthesia Type:General  Level of Consciousness: awake, alert  and oriented  Airway & Oxygen Therapy: Patient Spontanous Breathing and Patient connected to nasal cannula oxygen  Post-op Assessment: Report given to RN and Post -op Vital signs reviewed and stable  Post vital signs: Reviewed and stable  Last Vitals:  Vitals Value Taken Time  BP    Temp    Pulse    Resp    SpO2      Last Pain:  Vitals:   03/05/18 0918  TempSrc: Tympanic  PainSc: 0-No pain         Complications: No apparent anesthesia complications

## 2018-03-05 NOTE — Anesthesia Postprocedure Evaluation (Signed)
Anesthesia Post Note  Patient: Elizabeth Rivera  Procedure(s) Performed: COLONOSCOPY WITH PROPOFOL (N/A )  Patient location during evaluation: Endoscopy Anesthesia Type: General Level of consciousness: awake and alert Pain management: pain level controlled Vital Signs Assessment: post-procedure vital signs reviewed and stable Respiratory status: spontaneous breathing, nonlabored ventilation, respiratory function stable and patient connected to nasal cannula oxygen Cardiovascular status: blood pressure returned to baseline and stable Postop Assessment: no apparent nausea or vomiting Anesthetic complications: no     Last Vitals:  Vitals:   03/05/18 1040 03/05/18 1050  BP: (!) 165/88 (!) 189/84  Pulse: 70   Resp: 11 12  Temp:    SpO2: 100%     Last Pain:  Vitals:   03/05/18 1050  TempSrc:   PainSc: 0-No pain                 Martha Clan

## 2018-03-05 NOTE — Op Note (Signed)
[facility] Gastroenterology Patient Name: Elizabeth Rivera Procedure Date: 03/05/2018 9:46 AM MRN: [id number] Account #: [id number] Date of Birth: 1950-09-11 Admit Type: Outpatient Age: 68 Room: [facility] ENDO ROOM 4 Gender: Female Note Status: Finalized Procedure:            Colonoscopy Indications:          High risk colon cancer surveillance: Personal history                        of colonic polyps Providers:            Lucilla Lame MD, MD Referring MD:         Ocie Cornfield. Ouida Sills MD, MD (Referring MD) Medicines:            Propofol per Anesthesia Complications:        No immediate complications. Procedure:            Pre-Anesthesia Assessment:                       - Prior to the procedure, a History and Physical was                        performed, and patient medications and allergies were                        reviewed. The patient's tolerance of previous                        anesthesia was also reviewed. The risks and benefits of                        the procedure and the sedation options and risks were                        discussed with the patient. All questions were                        answered, and informed consent was obtained. Prior                        Anticoagulants: The patient has taken no previous                        anticoagulant or antiplatelet agents. ASA Grade                        Assessment: II - A patient with mild systemic disease.                        After reviewing the risks and benefits, the patient was                        deemed in satisfactory condition to undergo the                        procedure.                       After obtaining informed consent, the colonoscope was  passed under direct vision. Throughout the procedure,                        the patient's blood pressure, pulse, and oxygen                        saturations were monitored continuously. The   Colonoscope was introduced through the anus and                        advanced to the the cecum, identified by appendiceal                        orifice and ileocecal valve. The colonoscopy was                        performed without difficulty. The patient tolerated the                        procedure well. The quality of the bowel preparation                        was excellent. Findings:      The perianal and digital rectal examinations were normal.      A 4 mm polyp was found in the transverse colon. The polyp was sessile.       The polyp was removed with a cold biopsy forceps. Resection and       retrieval were complete.      A 5 mm polyp was found in the hepatic flexure. The polyp was sessile.       The polyp was removed with a cold snare. Resection and retrieval were       complete.      Multiple small-mouthed diverticula were found in the entire colon.      Non-bleeding internal hemorrhoids were found during retroflexion. The       hemorrhoids were Grade II (internal hemorrhoids that prolapse but reduce       spontaneously). Impression:           - One 4 mm polyp in the transverse colon, removed with                        a cold biopsy forceps. Resected and retrieved.                       - One 5 mm polyp at the hepatic flexure, removed with a                        cold snare. Resected and retrieved.                       - Diverticulosis in the entire examined colon.                       - Non-bleeding internal hemorrhoids. Recommendation:       - Discharge patient to home.                       - Resume previous diet.                       -  Continue present medications.                       - Await pathology results.                       - Repeat colonoscopy in 5 years for surveillance. Procedure Code(s):    --- Professional ---                       [id number], Colonoscopy, flexible; with removal of tumor(s),                        polyp(s), or other lesion(s) by  snare technique                       45380, 67, Colonoscopy, flexible; with biopsy, single                        or multiple Diagnosis Code(s):    --- Professional ---                       Z86.010, Personal history of colonic polyps                       D12.3, Benign neoplasm of transverse colon (hepatic                        flexure or splenic flexure) CPT copyright 2018 American Medical Association. All rights reserved. The codes documented in this report are preliminary and upon coder review may  be revised to meet current compliance requirements. Lucilla Lame MD, MD 03/05/2018 10:18:42 AM This report has been signed electronically. Number of Addenda: 0 Note Initiated On: 03/05/2018 9:46 AM Scope Withdrawal Time: 0 hours 6 minutes 42 seconds  Total Procedure Duration: 0 hours 12 minutes 1 second       [facility]

## 2018-03-06 ENCOUNTER — Encounter: Payer: Self-pay | Admitting: Gastroenterology

## 2018-03-06 LAB — SURGICAL PATHOLOGY

## 2018-03-07 ENCOUNTER — Encounter: Payer: Self-pay | Admitting: Gastroenterology

## 2018-03-20 ENCOUNTER — Other Ambulatory Visit: Payer: Self-pay | Admitting: Internal Medicine

## 2018-03-20 DIAGNOSIS — Z1231 Encounter for screening mammogram for malignant neoplasm of breast: Secondary | ICD-10-CM

## 2018-03-20 NOTE — Addendum Note (Signed)
Addendum  created 03/20/18 1238 by Emmie Niemann, MD   Attestation recorded in [place], [place] filed

## 2018-04-12 DIAGNOSIS — D1801 Hemangioma of skin and subcutaneous tissue: Secondary | ICD-10-CM | POA: Diagnosis not present

## 2018-04-12 DIAGNOSIS — L821 Other seborrheic keratosis: Secondary | ICD-10-CM | POA: Diagnosis not present

## 2018-04-16 ENCOUNTER — Other Ambulatory Visit: Payer: Self-pay

## 2018-04-17 ENCOUNTER — Other Ambulatory Visit: Payer: Self-pay

## 2018-04-17 MED ORDER — RANITIDINE HCL 300 MG PO TABS: 300.0000 mg | ORAL_TABLET | Freq: Every day | ORAL | 2 refills | Status: DC

## 2018-04-24 DIAGNOSIS — E785 Hyperlipidemia, unspecified: Secondary | ICD-10-CM | POA: Diagnosis not present

## 2018-04-24 DIAGNOSIS — E039 Hypothyroidism, unspecified: Secondary | ICD-10-CM | POA: Diagnosis not present

## 2018-04-24 DIAGNOSIS — E1169 Type 2 diabetes mellitus with other specified complication: Secondary | ICD-10-CM | POA: Diagnosis not present

## 2018-04-24 DIAGNOSIS — I10 Essential (primary) hypertension: Secondary | ICD-10-CM | POA: Diagnosis not present

## 2018-04-24 DIAGNOSIS — E1159 Type 2 diabetes mellitus with other circulatory complications: Secondary | ICD-10-CM | POA: Diagnosis not present

## 2018-04-24 DIAGNOSIS — E1142 Type 2 diabetes mellitus with diabetic polyneuropathy: Secondary | ICD-10-CM | POA: Diagnosis not present

## 2018-05-01 ENCOUNTER — Other Ambulatory Visit: Payer: Self-pay

## 2018-05-01 ENCOUNTER — Ambulatory Visit
Admission: RE | Admit: 2018-05-01 | Discharge: 2018-05-01 | Disposition: A | Discharge status: Home / Self Care | Payer: Medicare HMO | Source: Ambulatory Visit | Attending: Internal Medicine | Admitting: Internal Medicine

## 2018-05-01 ENCOUNTER — Other Ambulatory Visit: Payer: Self-pay | Admitting: Internal Medicine

## 2018-05-01 DIAGNOSIS — E039 Hypothyroidism, unspecified: Secondary | ICD-10-CM | POA: Diagnosis not present

## 2018-05-01 DIAGNOSIS — F325 Major depressive disorder, single episode, in full remission: Secondary | ICD-10-CM | POA: Diagnosis not present

## 2018-05-01 DIAGNOSIS — R2241 Localized swelling, mass and lump, right lower limb: Secondary | ICD-10-CM | POA: Diagnosis not present

## 2018-05-01 DIAGNOSIS — E785 Hyperlipidemia, unspecified: Secondary | ICD-10-CM | POA: Diagnosis not present

## 2018-05-01 DIAGNOSIS — R609 Edema, unspecified: Secondary | ICD-10-CM | POA: Diagnosis not present

## 2018-05-01 DIAGNOSIS — R600 Localized edema: Secondary | ICD-10-CM | POA: Diagnosis not present

## 2018-05-01 DIAGNOSIS — I10 Essential (primary) hypertension: Secondary | ICD-10-CM | POA: Diagnosis not present

## 2018-05-01 DIAGNOSIS — E1159 Type 2 diabetes mellitus with other circulatory complications: Secondary | ICD-10-CM | POA: Diagnosis not present

## 2018-05-01 DIAGNOSIS — Z9989 Dependence on other enabling machines and devices: Secondary | ICD-10-CM | POA: Diagnosis not present

## 2018-05-01 DIAGNOSIS — E1142 Type 2 diabetes mellitus with diabetic polyneuropathy: Secondary | ICD-10-CM | POA: Diagnosis not present

## 2018-05-01 DIAGNOSIS — E1169 Type 2 diabetes mellitus with other specified complication: Secondary | ICD-10-CM | POA: Diagnosis not present

## 2018-06-03 ENCOUNTER — Other Ambulatory Visit: Payer: Self-pay

## 2018-06-03 MED ORDER — FAMOTIDINE 20 MG PO TABS: 20.0000 mg | ORAL_TABLET | Freq: Every day | ORAL | 1 refills | Status: DC

## 2018-06-24 ENCOUNTER — Telehealth: Payer: Self-pay | Admitting: Urology

## 2018-06-24 NOTE — Telephone Encounter (Signed)
Pt called and states that she is having difficulty urinating and also having freq urination. She denies any other problems, she is taking Myrbetriq 50mg . Please advise.

## 2018-06-24 NOTE — Telephone Encounter (Signed)
Should patient come in for a UA and PVR?

## 2018-06-25 NOTE — Telephone Encounter (Signed)
The last time Elizabeth Rivera was seen in our office was by Dr. Pilar Jarvis in 07/2016.  She needs an appointment with a provider.

## 2018-06-25 NOTE — Telephone Encounter (Signed)
Patient needs a follow up with a provider this week or next if possible thanks

## 2018-06-27 NOTE — Telephone Encounter (Signed)
Elizabeth Rivera is coming in for an appointment with me on 07/10/2018.  She is instructed to stop her Myrbetriq and/or Ditropan until her appointment.

## 2018-07-09 ENCOUNTER — Other Ambulatory Visit: Payer: Self-pay

## 2018-07-09 ENCOUNTER — Ambulatory Visit
Admission: RE | Admit: 2018-07-09 | Discharge: 2018-07-09 | Disposition: A | Discharge status: Home / Self Care | Payer: Medicare HMO | Source: Ambulatory Visit | Attending: Internal Medicine | Admitting: Internal Medicine

## 2018-07-09 DIAGNOSIS — Z1231 Encounter for screening mammogram for malignant neoplasm of breast: Secondary | ICD-10-CM | POA: Diagnosis not present

## 2018-07-09 NOTE — Progress Notes (Signed)
07/10/2018 1:54 PM   Elizabeth Rivera 04-30-1950 [id number]  Referring provider: Kirk Ruths, MD [facility] [place], [place] [address]  Chief Complaint  Patient presents with  . Over Active Bladder    HPI: [name] is a 68 year old Caucasian female with urge incontinence who presents today for follow up.  The patient is  experiencing urgency x 4-7, frequency x 4-7, not restricting fluids to avoid visits to the restroom, is engaging in toilet mapping, incontinence x 0-3 and nocturia x 4-7.   Her BP is 187/90.  Repeated BP is 208/100.  Her PVR is 0 mL.    She is taking Myrbetriq 50 mg daily in the evening, but she discontinued it one week ago.    She said she is having incontinence issues mostly at night and is sometimes she wets herself before she knows that she had the need to urinate.  She also is having hypoglycemic episodes at night about once a week.   Patient denies any gross hematuria, dysuria or suprapubic/flank pain.  Patient denies any fevers, chills, nausea or vomiting.   Her UA negative.      She is drinking a lot of sweet tea and she is an uncontrolled diabetic.  Her most recent Hbg A1c 9.5% on 04/24/2018.    She is complaining of being out of breath after walking from the [place] to our office.  Her skin is also cold and clammy.  She states that she has not eaten since 8 am.      PMH: Past Medical History:  Diagnosis Date  . Anemia   . Anemia of other chronic disease 09/01/2013  . Anxiety   . Arthritis    back s/p fracture x3 approx 1983  . Candidal vaginitis   . Diabetes mellitus without complication ([place])   . Diverticulosis of colon (without mention of hemorrhage) 05/20/2009   Internal hemms (Dr. Vira Agar)  . Forearm fracture    2013, left  . GERD (gastroesophageal reflux disease)    reflux HH 09/01/2002//egd reflux Esoph. HH Gastritis nonbleeding erosive gastropathy Duodentitis 08/15/2006  . Hyperglycemia    . Hyperlipidemia   . Hypertension   . Insomnia, unspecified   . Irritable bowel syndrome    history of  . Nervous breakdown 08/2002   [facility]  . Neuropathy   . Other abnormal blood chemistry   . Other chest pain    chest discomfort  . Other esophagitis    erosive esophagitis  . Other specified disorders of adrenal glands 07/2007   adrenal mass via of CT 1.4cm left Adrenal no change(Dr.Cope)   . Other specified gastritis without mention of hemorrhage    erosvie gastritis  . Stroke ([place]) 03/2014  . Stroke ([place]) 03/2015  . Thyroid disease    hypothyroid, goiter  . Wears dentures    partial upper and lower.    Surgical History: Past Surgical History:  Procedure Laterality Date  . ABDOMINAL HYSTERECTOMY  1995   Part Hyst BSO DUB ovarian cyst B9  . BREAST CYST ASPIRATION Bilateral   . BREAST REDUCTION SURGERY  1984  . CHOLECYSTECTOMY  03/2004  . COLONOSCOPY  1. 08/15/06  2. 05/20/09   Int Hemms  2. Divertics Int Hemms (Dr. Vira Agar)  . COLONOSCOPY WITH PROPOFOL N/A 03/05/2018   Procedure: COLONOSCOPY WITH PROPOFOL;  Surgeon: Lucilla Lame, MD;  Location: [facility] ENDOSCOPY;  Service: Endoscopy;  Laterality: N/A;  . CT ABD W &  PELVIS WO CM  6/09   CT Scan abd stable adrenal adenoma 1.4cm left adrenal no change (Dr. Jacqlyn Larsen)  . CYSTOSCOPY  01/08/2008   Former site of inflammation healed (Dr. Jacqlyn Larsen)  . ESOPHAGOGASTRODUODENOSCOPY  1. 09/01/02  2. 08/15/06   1. Reflux, HH  2. Reflux esoph HH gaastritis nonbleeding erosive gastropathy duodenitis  . ESOPHAGOGASTRODUODENOSCOPY (EGD) WITH PROPOFOL N/A 06/01/2016   Procedure: ESOPHAGOGASTRODUODENOSCOPY (EGD) WITH PROPOFOL;  Surgeon: Lucilla Lame, MD;  Location: [place];  Service: Endoscopy;  Laterality: N/A;  diabetic - insulin and oral meds  . NSVD x1  1977  . REDUCTION MAMMAPLASTY  1985  . VAGINAL DELIVERY  1977    Home Medications:  Allergies as of 07/10/2018      Reactions   Sulfa Antibiotics Rash, Anaphylaxis   And angioedmea       Medication List       Accurate as of July 10, 2018  1:54 PM. If you have any questions, ask your nurse or doctor.        STOP taking these medications   Fifty50 Glucose Meter 2.0 w/Device Kit Stopped by:  Zara Council, PA-C     TAKE these medications   ALPRAZolam 0.25 MG tablet Commonly known as:  XANAX Take 1 tablet (0.25 mg total) by mouth 2 (two) times daily.   atorvastatin 40 MG tablet Commonly known as:  LIPITOR Take 1 tablet (40 mg total) by mouth daily at 6 PM.   cholecalciferol 1000 units tablet Commonly known as:  VITAMIN D Take 1,000 Units by mouth daily.   cloNIDine 0.1 MG tablet Commonly known as:  CATAPRES Take by mouth.   clopidogrel 75 MG tablet Commonly known as:  PLAVIX Take 1 tablet (75 mg total) by mouth daily.   CRANBERRY PO Take by mouth daily.   Dulcolax 10 MG suppository Generic drug:  bisacodyl Place 10 mg rectally as needed for moderate constipation.   famotidine 20 MG tablet Commonly known as:  PEPCID Take 1 tablet (20 mg total) by mouth daily.   ferrous sulfate 325 (65 FE) MG tablet Commonly known as:  [employer] Iron Take 1 tablet (325 mg total) by mouth daily with breakfast.   gabapentin 100 MG capsule Commonly known as:  NEURONTIN Take 200 mg by mouth 3 (three) times daily.   hydrochlorothiazide 25 MG tablet Commonly known as:  HYDRODIURIL Take 1 tablet (25 mg total) by mouth daily.   insulin aspart 100 UNIT/ML injection Commonly known as:  novoLOG Inject 5 Units into the skin 3 (three) times daily before meals.   insulin glargine 100 UNIT/ML injection Commonly known as:  LANTUS Inject 9 Units into the skin at bedtime.   levothyroxine 112 MCG tablet Commonly known as:  SYNTHROID Take by mouth.   lisinopril 40 MG tablet Commonly known as:  ZESTRIL Take 1 tablet (40 mg total) by mouth daily.   Melatonin 5 MG Tabs Take 5 mg by mouth at bedtime.   metFORMIN 1000 MG tablet Commonly known as:  GLUCOPHAGE Take 0.5  tablets (500 mg total) by mouth 2 (two) times daily with a meal.   metoprolol succinate 100 MG 24 hr tablet Commonly known as:  TOPROL-XL Take 1 tablet (100 mg total) by mouth daily. Take with or immediately following a meal.   mirabegron ER 25 MG Tb24 tablet Commonly known as:  MYRBETRIQ Take 1 tablet (25 mg total) by mouth daily.   mirtazapine 7.5 MG tablet Commonly known as:  REMERON Take by mouth.  Na Sulfate-K Sulfate-Mg Sulf 17.5-3.13-1.6 GM/177ML Soln Commonly known as:  Suprep Bowel Prep Kit Take 1 kit by mouth as directed.   pantoprazole 40 MG tablet Commonly known as:  PROTONIX Take 1 tablet (40 mg total) by mouth 2 (two) times daily.   ranitidine 300 MG tablet Commonly known as:  ZANTAC Take 1 tablet (300 mg total) by mouth at bedtime.   venlafaxine XR 150 MG 24 hr capsule Commonly known as:  EFFEXOR-XR Take 1 capsule (150 mg total) by mouth 2 (two) times daily.   vitamin B-12 1000 MCG tablet Commonly known as:  CYANOCOBALAMIN Take 1,000 mcg by mouth daily.       Allergies:  Allergies  Allergen Reactions  . Sulfa Antibiotics Rash and Anaphylaxis    And angioedmea    Family History: Family History  Problem Relation Age of Onset  . Hypertension Mother   . Hyperlipidemia Mother   . Stroke Mother        70  . Vision loss Father        vision problems  . Cancer Father        Colon   . Hyperlipidemia Sister   . Hypertension Sister   . Vision loss Sister   . Breast cancer Maternal Grandmother 38  . Breast cancer Cousin        paternal cousin  . Prostate cancer Paternal Grandfather   . Bladder Cancer Neg Hx   . Kidney cancer Neg Hx     Social History:  reports that she has never smoked. She has never used smokeless tobacco. She reports that she does not drink alcohol or use drugs.  ROS: UROLOGY Frequent Urination?: Yes Hard to postpone urination?: No Burning/pain with urination?: No Get up at night to urinate?: No Leakage of urine?: No  Urine stream starts and stops?: No Trouble starting stream?: Yes Do you have to strain to urinate?: Yes Blood in urine?: No Urinary tract infection?: No Sexually transmitted disease?: No Injury to kidneys or bladder?: No Painful intercourse?: No Weak stream?: Yes Currently pregnant?: No Vaginal bleeding?: No Last menstrual period?: n  Gastrointestinal Nausea?: No Vomiting?: No Indigestion/heartburn?: Yes Diarrhea?: No Constipation?: Yes  Constitutional Fever: No Night sweats?: Yes Weight loss?: No Fatigue?: Yes  Skin Skin rash/lesions?: No Itching?: Yes  Eyes Blurred vision?: No Double vision?: No  Ears/Nose/Throat Sore throat?: No Sinus problems?: Yes  Hematologic/Lymphatic Swollen glands?: No Easy bruising?: Yes  Cardiovascular Leg swelling?: Yes Chest pain?: Yes  Respiratory Cough?: Yes Shortness of breath?: Yes  Endocrine Excessive thirst?: Yes  Musculoskeletal Back pain?: No Joint pain?: Yes  Neurological Headaches?: Yes Dizziness?: Yes  Psychologic Depression?: Yes Anxiety?: Yes  Physical Exam: BP (!) 187/90 (BP Location: Left Arm, Patient Position: Sitting, Cuff Size: Normal)   Pulse 78   Ht _0  (1.6 m)   Wt 130 lb 1.6 oz (59 kg)   BMI 23.05 kg/m   Constitutional:  Well nourished. Alert and oriented, No acute distress. HEENT: [place] AT, moist mucus membranes.  Trachea midline, no masses. Cardiovascular: No clubbing, cyanosis, or edema. Respiratory: Normal respiratory effort, no increased work of breathing. GI: Abdomen is soft, non tender, non distended, no abdominal masses. Liver and spleen not palpable.  No hernias appreciated.  Stool sample for occult testing is not indicated.   GU: No CVA tenderness.  No bladder fullness or masses.  Atrophic external genitalia, sparse pubic hair distribution, no lesions.  Normal urethral meatus, no lesions, no prolapse, no discharge.  No urethral masses, tenderness and/or tenderness. Bladder is  tender to palpation.  Pale vagina mucosa, fair estrogen effect, no discharge, no lesions, good pelvic support, no cystocele and no rectocele noted.  Anus and perineum are without rashes or lesions.    Skin: No rashes, bruises or suspicious lesions. Clammy to touch.   Lymph: No inguinal adenopathy. Neurologic: Grossly intact, no focal deficits, moving all 4 extremities. Psychiatric: Normal mood and affect.   Laboratory Data: Lab Results  Component Value Date   WBC 4.8 01/04/2018   HGB 8.8 (L) 01/04/2018   HCT 27.5 (L) 01/04/2018   MCV 85.1 01/04/2018   PLT 156 01/04/2018    Lab Results  Component Value Date   CREATININE 1.25 (H) 01/04/2018    No results found for: PSA  No results found for: TESTOSTERONE  Lab Results  Component Value Date   HGBA1C 7.4 (H) 03/12/2015    Lab Results  Component Value Date   TSH 7.927 (H) 01/03/2018       Component Value Date/Time   CHOL 250 (H) 03/12/2015 0518   HDL 64 03/12/2015 0518   CHOLHDL 3.9 03/12/2015 0518   VLDL 26 03/12/2015 0518   LDLCALC 160 (H) 03/12/2015 0518    Lab Results  Component Value Date   AST 33 01/24/2016   Lab Results  Component Value Date   ALT 15 01/24/2016   No components found for: ALKALINEPHOPHATASE No components found for: BILIRUBINTOTAL  No results found for: ESTRADIOL  Urinalysis Negative.  See Epic.   I have reviewed the labs.   Pertinent Imaging: Results for TINEA, NOBILE (MRN [id number]) as of 07/10/2018 13:29  Ref. Range 07/10/2018 13:26  Scan Result Unknown 0     Assessment & Plan:    1. Hypertension Taken to Dr. Tonette Bihari office at this time  2. Urge incontinence Discussed how uncontrolled DM and taking in sweet drinks, even artificial sweeteners, can irritate the bladder and make incontinence worse  Will discontinue the Myrbetriq at this time as one of its side effects is HTN   3. Bladder pain UA was negative, but will send for culture to rule out indolent infection   Return for pending urine culture results.  These notes generated with voice recognition software. I apologize for typographical errors.  Zara Council, PA-C  [facility] [address]  [place], [place] [address] [phone]

## 2018-07-10 ENCOUNTER — Ambulatory Visit (INDEPENDENT_AMBULATORY_CARE_PROVIDER_SITE_OTHER): Payer: Medicare HMO | Admitting: Urology

## 2018-07-10 ENCOUNTER — Encounter: Payer: Self-pay | Admitting: Urology

## 2018-07-10 ENCOUNTER — Other Ambulatory Visit: Payer: Self-pay

## 2018-07-10 VITALS — BP 208/100 | HR 82 | Ht 63.0 in | Wt 130.1 lb

## 2018-07-10 DIAGNOSIS — R3989 Other symptoms and signs involving the genitourinary system: Secondary | ICD-10-CM | POA: Diagnosis not present

## 2018-07-10 DIAGNOSIS — E785 Hyperlipidemia, unspecified: Secondary | ICD-10-CM | POA: Diagnosis not present

## 2018-07-10 DIAGNOSIS — E1169 Type 2 diabetes mellitus with other specified complication: Secondary | ICD-10-CM | POA: Diagnosis not present

## 2018-07-10 DIAGNOSIS — N3941 Urge incontinence: Secondary | ICD-10-CM | POA: Diagnosis not present

## 2018-07-10 DIAGNOSIS — E1142 Type 2 diabetes mellitus with diabetic polyneuropathy: Secondary | ICD-10-CM | POA: Diagnosis not present

## 2018-07-10 DIAGNOSIS — I10 Essential (primary) hypertension: Secondary | ICD-10-CM

## 2018-07-10 DIAGNOSIS — E1159 Type 2 diabetes mellitus with other circulatory complications: Secondary | ICD-10-CM | POA: Diagnosis not present

## 2018-07-10 LAB — URINALYSIS, COMPLETE
Bilirubin, UA: NEGATIVE
Glucose, UA: NEGATIVE
Ketones, UA: NEGATIVE
Leukocytes,UA: NEGATIVE
Nitrite, UA: NEGATIVE
Protein,UA: NEGATIVE
RBC, UA: NEGATIVE
Specific Gravity, UA: 1.015 (ref 1.005–1.030)
Urobilinogen, Ur: 0.2 mg/dL (ref 0.2–1.0)
pH, UA: 5.5 (ref 5.0–7.5)

## 2018-07-10 LAB — MICROSCOPIC EXAMINATION
Bacteria, UA: NONE SEEN
RBC, Urine: NONE SEEN /hpf (ref 0–2)
WBC, UA: NONE SEEN /hpf (ref 0–5)

## 2018-07-10 LAB — BLADDER SCAN AMB NON-IMAGING: Scan Result: 0

## 2018-07-11 ENCOUNTER — Other Ambulatory Visit: Payer: Self-pay | Admitting: Internal Medicine

## 2018-07-11 DIAGNOSIS — N6489 Other specified disorders of breast: Secondary | ICD-10-CM

## 2018-07-11 DIAGNOSIS — R928 Other abnormal and inconclusive findings on diagnostic imaging of breast: Secondary | ICD-10-CM

## 2018-07-13 ENCOUNTER — Other Ambulatory Visit: Payer: Self-pay | Admitting: Urology

## 2018-07-13 LAB — CULTURE, URINE COMPREHENSIVE

## 2018-07-13 MED ORDER — NITROFURANTOIN MONOHYD MACRO 100 MG PO CAPS: 100.0000 mg | ORAL_CAPSULE | Freq: Two times a day (BID) | ORAL | 0 refills | Status: AC

## 2018-07-15 ENCOUNTER — Telehealth: Payer: Self-pay

## 2018-07-15 NOTE — Telephone Encounter (Signed)
-----   Message from Nori Riis, PA-C sent at 07/13/2018  1:23 PM EDT ----- Please reach out to Mrs. Warth and make sure she got her Macrobid.  She also needs an appointment in one month as well.

## 2018-07-15 NOTE — Telephone Encounter (Signed)
Called pt no answer, LM informing pt of the information below. Advised pt to call back in order to schedule appt.

## 2018-07-19 ENCOUNTER — Ambulatory Visit
Admission: RE | Admit: 2018-07-19 | Discharge: 2018-07-19 | Disposition: A | Discharge status: Home / Self Care | Payer: Medicare HMO | Source: Ambulatory Visit | Attending: Internal Medicine | Admitting: Internal Medicine

## 2018-07-19 ENCOUNTER — Other Ambulatory Visit: Payer: Self-pay

## 2018-07-19 DIAGNOSIS — N6489 Other specified disorders of breast: Secondary | ICD-10-CM

## 2018-07-19 DIAGNOSIS — R928 Other abnormal and inconclusive findings on diagnostic imaging of breast: Secondary | ICD-10-CM | POA: Insufficient documentation

## 2018-07-19 DIAGNOSIS — R922 Inconclusive mammogram: Secondary | ICD-10-CM | POA: Diagnosis not present

## 2018-07-22 ENCOUNTER — Telehealth: Payer: Self-pay | Admitting: Urology

## 2018-07-22 DIAGNOSIS — R3989 Other symptoms and signs involving the genitourinary system: Secondary | ICD-10-CM

## 2018-07-22 NOTE — Telephone Encounter (Signed)
Patient called the office today to report that she is still having the following symptoms:  Urinary frequency, difficulty urinating at time, bladder spasms and pain.  She took her last dose of Macrobid on Friday night 07/19/18.  Please advise.

## 2018-07-22 NOTE — Telephone Encounter (Signed)
Patient notified and scheduled for a lab UA drop off tomorrow

## 2018-07-22 NOTE — Telephone Encounter (Signed)
Yes.  Please have Mrs. Axe come in for an UA and culture.

## 2018-07-23 ENCOUNTER — Other Ambulatory Visit: Payer: Self-pay

## 2018-07-23 ENCOUNTER — Other Ambulatory Visit: Payer: Medicare HMO

## 2018-07-23 DIAGNOSIS — R3989 Other symptoms and signs involving the genitourinary system: Secondary | ICD-10-CM

## 2018-07-24 LAB — URINALYSIS, COMPLETE
Bilirubin, UA: NEGATIVE
Ketones, UA: NEGATIVE
Leukocytes,UA: NEGATIVE
Nitrite, UA: NEGATIVE
Protein,UA: NEGATIVE
RBC, UA: NEGATIVE
Specific Gravity, UA: 1.01 (ref 1.005–1.030)
Urobilinogen, Ur: 0.2 mg/dL (ref 0.2–1.0)
pH, UA: 5.5 (ref 5.0–7.5)

## 2018-07-24 LAB — MICROSCOPIC EXAMINATION
Bacteria, UA: NONE SEEN
Epithelial Cells (non renal): NONE SEEN /hpf (ref 0–10)
RBC: NONE SEEN /hpf (ref 0–2)
WBC, UA: NONE SEEN /hpf (ref 0–5)

## 2018-07-25 LAB — CULTURE, URINE COMPREHENSIVE

## 2018-07-26 ENCOUNTER — Telehealth: Payer: Self-pay

## 2018-07-26 NOTE — Telephone Encounter (Signed)
Called pt, husband answers (listed on DPR) states that pt is sleeping. Gave husband results, he gave verbal understanding and states that he will relay message to patient.

## 2018-07-26 NOTE — Telephone Encounter (Signed)
-----   Message from Nori Riis, PA-C sent at 07/26/2018  8:50 AM EDT ----- Mrs. Gurry's urine culture did not grow out enough bacteria to cause an UTI.  This is likely a skin contaminate.  Please have her keep her appointment on 08/07/2018.

## 2018-08-07 ENCOUNTER — Ambulatory Visit: Payer: Medicare HMO | Admitting: Urology

## 2018-08-08 ENCOUNTER — Ambulatory Visit: Payer: Medicare HMO | Admitting: Urology

## 2018-08-28 DIAGNOSIS — R410 Disorientation, unspecified: Secondary | ICD-10-CM | POA: Diagnosis not present

## 2018-08-28 DIAGNOSIS — I10 Essential (primary) hypertension: Secondary | ICD-10-CM | POA: Diagnosis not present

## 2018-08-28 DIAGNOSIS — E1142 Type 2 diabetes mellitus with diabetic polyneuropathy: Secondary | ICD-10-CM | POA: Diagnosis not present

## 2018-08-28 DIAGNOSIS — Z79899 Other long term (current) drug therapy: Secondary | ICD-10-CM | POA: Diagnosis not present

## 2018-08-28 DIAGNOSIS — E1159 Type 2 diabetes mellitus with other circulatory complications: Secondary | ICD-10-CM | POA: Diagnosis not present

## 2018-09-12 DIAGNOSIS — E039 Hypothyroidism, unspecified: Secondary | ICD-10-CM | POA: Diagnosis not present

## 2018-09-12 DIAGNOSIS — E1159 Type 2 diabetes mellitus with other circulatory complications: Secondary | ICD-10-CM | POA: Diagnosis not present

## 2018-09-12 DIAGNOSIS — F325 Major depressive disorder, single episode, in full remission: Secondary | ICD-10-CM | POA: Diagnosis not present

## 2018-09-12 DIAGNOSIS — E1142 Type 2 diabetes mellitus with diabetic polyneuropathy: Secondary | ICD-10-CM | POA: Diagnosis not present

## 2018-09-12 DIAGNOSIS — I10 Essential (primary) hypertension: Secondary | ICD-10-CM | POA: Diagnosis not present

## 2018-09-12 DIAGNOSIS — R410 Disorientation, unspecified: Secondary | ICD-10-CM | POA: Diagnosis not present

## 2018-09-12 DIAGNOSIS — E785 Hyperlipidemia, unspecified: Secondary | ICD-10-CM | POA: Diagnosis not present

## 2018-09-12 DIAGNOSIS — E1169 Type 2 diabetes mellitus with other specified complication: Secondary | ICD-10-CM | POA: Diagnosis not present

## 2018-09-12 DIAGNOSIS — Z9989 Dependence on other enabling machines and devices: Secondary | ICD-10-CM | POA: Diagnosis not present

## 2018-11-16 ENCOUNTER — Other Ambulatory Visit: Payer: Self-pay | Admitting: Gastroenterology

## 2018-11-21 IMAGING — MG MM DIGITAL SCREENING BILAT W/ TOMO W/ CAD
9 of 12 series · 9 of 28 positions shown · non-contrast
Comparison: Previous exam(s).

CLINICAL DATA: Screening.

EXAM:
2D DIGITAL SCREENING BILATERAL MAMMOGRAM WITH CAD AND ADJUNCT TOMO

[L CC synth-2D]
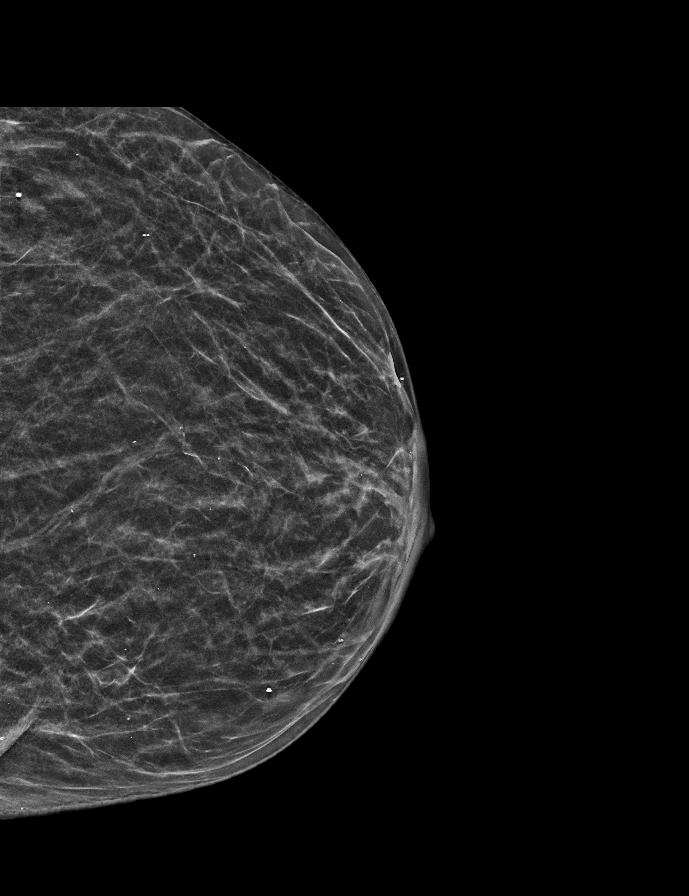

[L MLO]
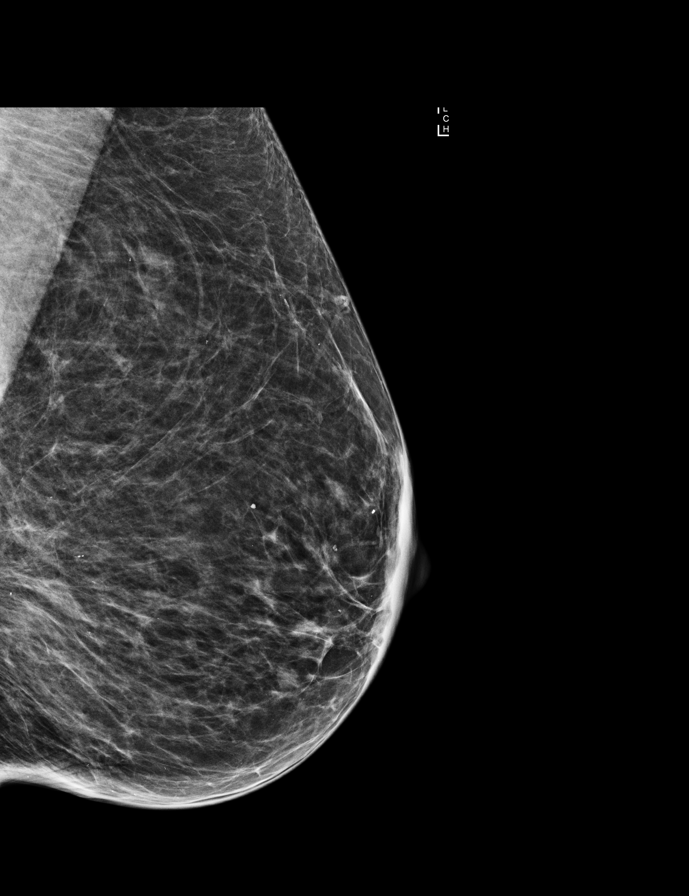

[R MLO]
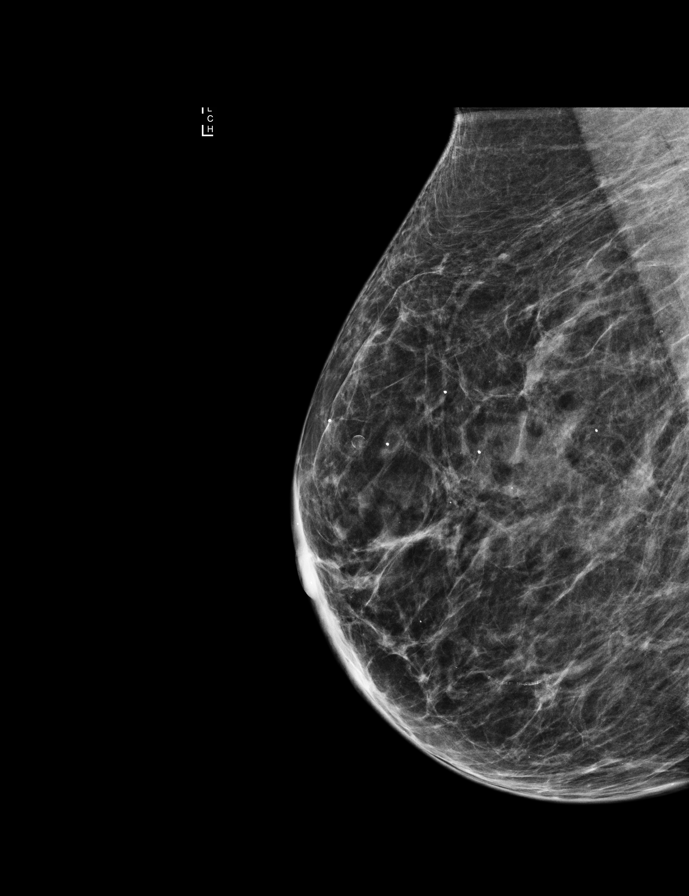

[L CC]
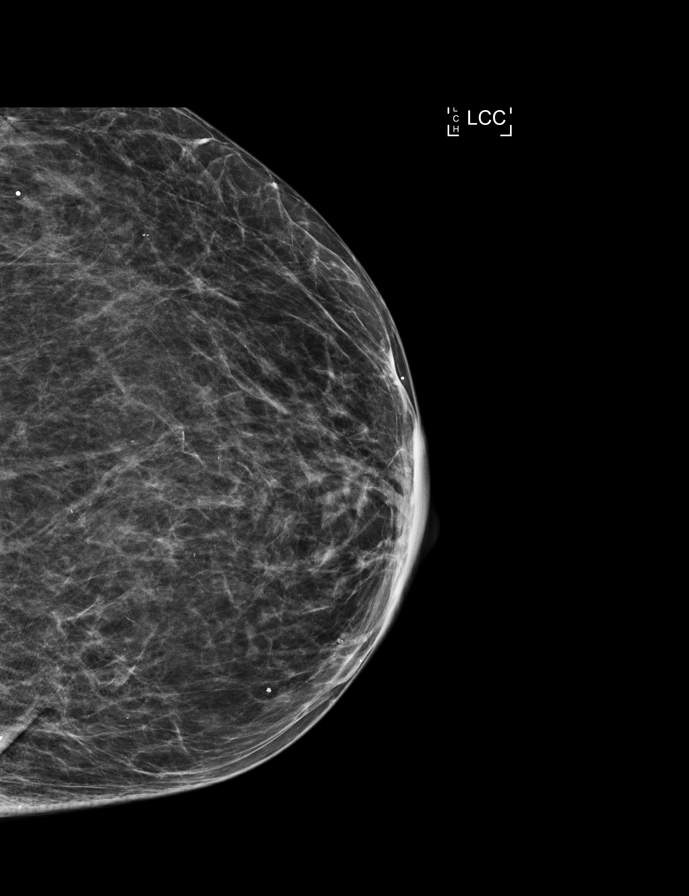

[R CC]
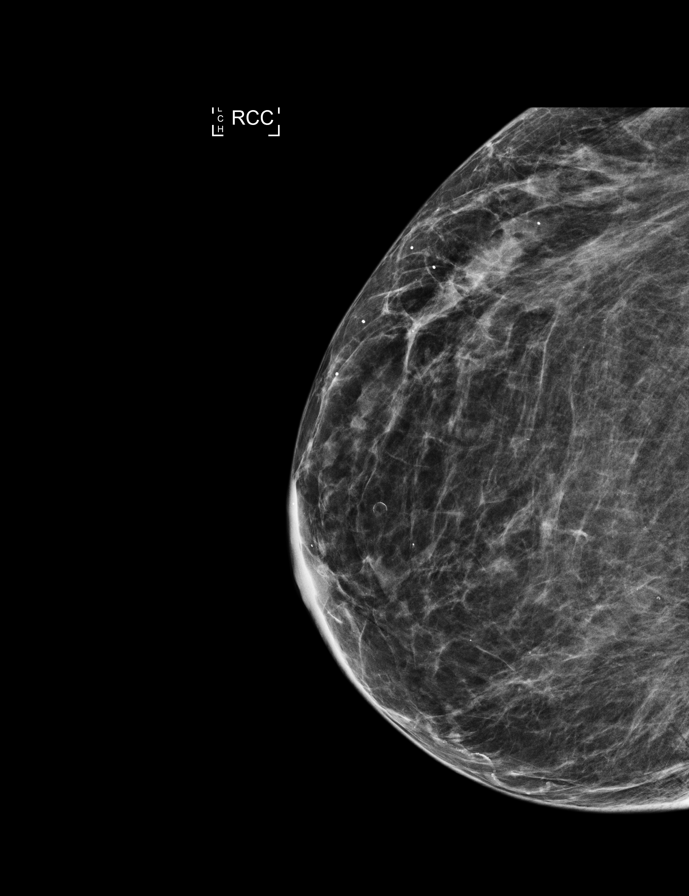

[R CC synth-2D]
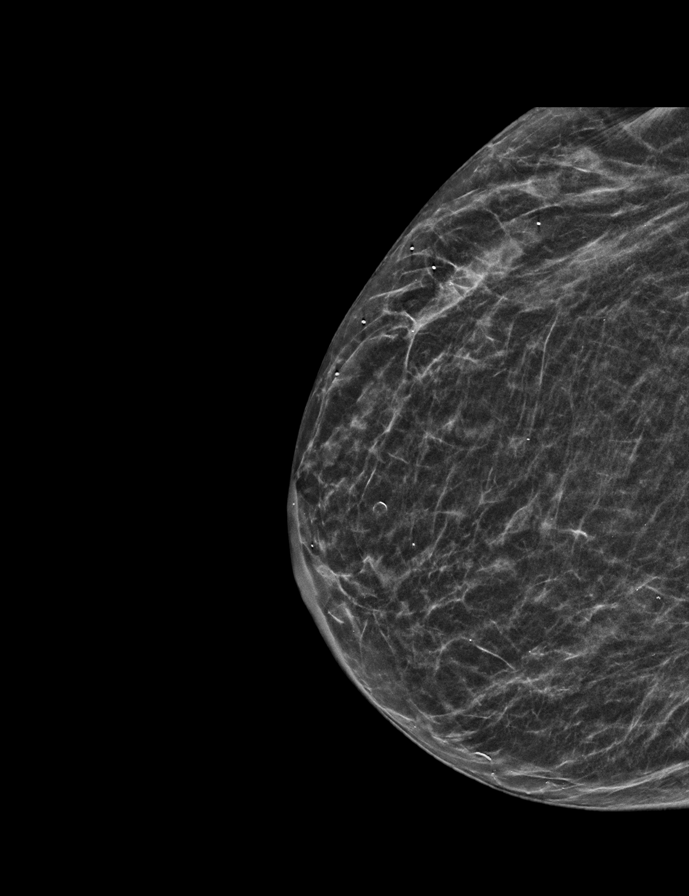

[R MLO synth-2D]
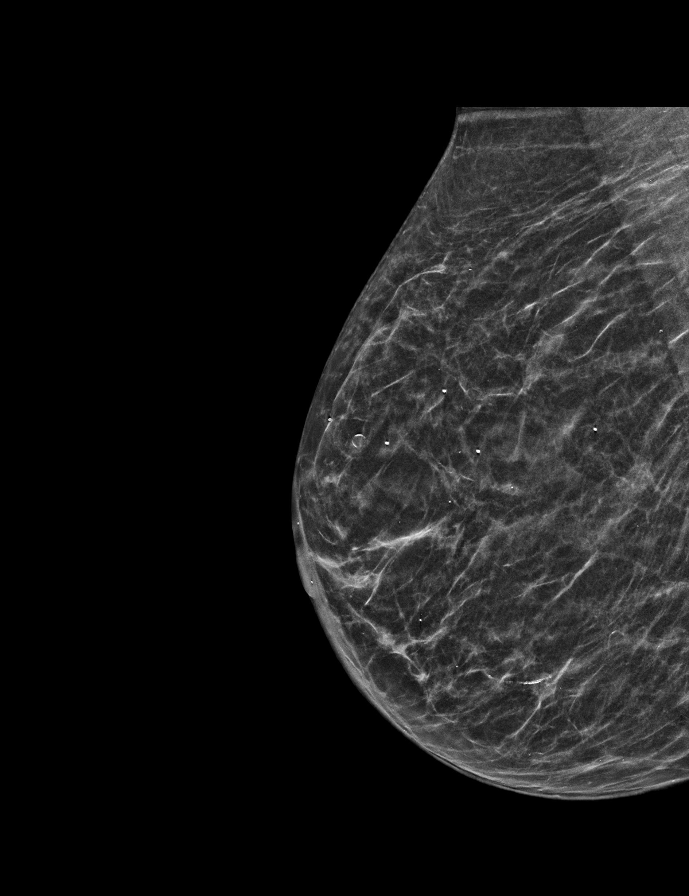

[L MLO synth-2D]
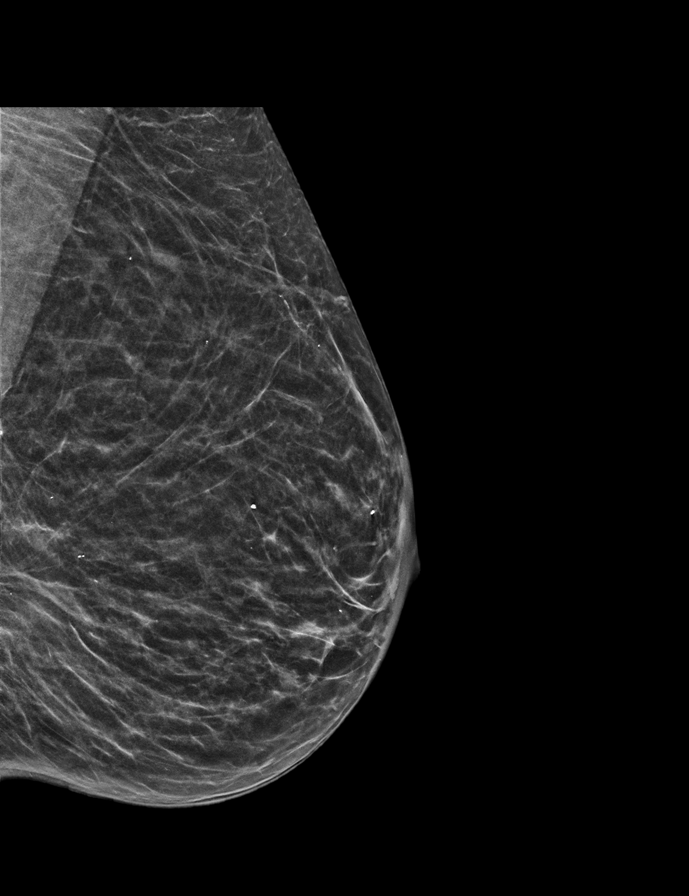

[L CC tomo · tomo slice 20/39.0]
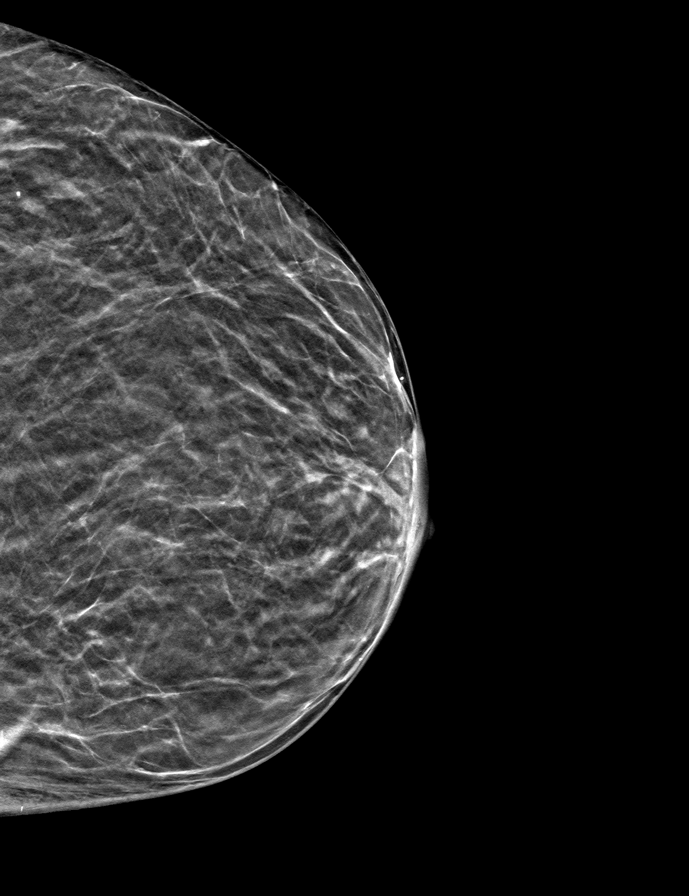

[9 of 28 positions shown; findings below may reference images not displayed]

ACR Breast Density Category b: There are scattered areas of
fibroglandular density.
FINDINGS: There are no findings suspicious for malignancy. Images were
processed with CAD.
IMPRESSION: No mammographic evidence of malignancy. A result letter of this
screening mammogram will be mailed directly to the patient.

RECOMMENDATION:
Screening mammogram in one year. (Code:97-6-RS4)

BI-RADS CATEGORY  1: Negative.

## 2018-11-26 DIAGNOSIS — M65312 Trigger thumb, left thumb: Secondary | ICD-10-CM | POA: Diagnosis not present

## 2018-11-26 DIAGNOSIS — M25542 Pain in joints of left hand: Secondary | ICD-10-CM | POA: Diagnosis not present

## 2018-11-26 DIAGNOSIS — M25551 Pain in right hip: Secondary | ICD-10-CM | POA: Diagnosis not present

## 2018-11-26 DIAGNOSIS — M65342 Trigger finger, left ring finger: Secondary | ICD-10-CM | POA: Diagnosis not present

## 2018-11-26 DIAGNOSIS — M25552 Pain in left hip: Secondary | ICD-10-CM | POA: Diagnosis not present

## 2018-12-03 DIAGNOSIS — E1159 Type 2 diabetes mellitus with other circulatory complications: Secondary | ICD-10-CM | POA: Diagnosis not present

## 2018-12-03 DIAGNOSIS — F325 Major depressive disorder, single episode, in full remission: Secondary | ICD-10-CM | POA: Diagnosis not present

## 2018-12-03 DIAGNOSIS — E1142 Type 2 diabetes mellitus with diabetic polyneuropathy: Secondary | ICD-10-CM | POA: Diagnosis not present

## 2018-12-03 DIAGNOSIS — I10 Essential (primary) hypertension: Secondary | ICD-10-CM | POA: Diagnosis not present

## 2018-12-03 DIAGNOSIS — E039 Hypothyroidism, unspecified: Secondary | ICD-10-CM | POA: Diagnosis not present

## 2018-12-03 DIAGNOSIS — Z0000 Encounter for general adult medical examination without abnormal findings: Secondary | ICD-10-CM | POA: Diagnosis not present

## 2018-12-03 DIAGNOSIS — E1169 Type 2 diabetes mellitus with other specified complication: Secondary | ICD-10-CM | POA: Diagnosis not present

## 2018-12-03 DIAGNOSIS — E785 Hyperlipidemia, unspecified: Secondary | ICD-10-CM | POA: Diagnosis not present

## 2018-12-03 DIAGNOSIS — Z9989 Dependence on other enabling machines and devices: Secondary | ICD-10-CM | POA: Diagnosis not present

## 2019-01-16 DIAGNOSIS — E785 Hyperlipidemia, unspecified: Secondary | ICD-10-CM | POA: Diagnosis not present

## 2019-01-16 DIAGNOSIS — E1159 Type 2 diabetes mellitus with other circulatory complications: Secondary | ICD-10-CM | POA: Diagnosis not present

## 2019-01-16 DIAGNOSIS — E1142 Type 2 diabetes mellitus with diabetic polyneuropathy: Secondary | ICD-10-CM | POA: Diagnosis not present

## 2019-01-16 DIAGNOSIS — I10 Essential (primary) hypertension: Secondary | ICD-10-CM | POA: Diagnosis not present

## 2019-01-16 DIAGNOSIS — E1169 Type 2 diabetes mellitus with other specified complication: Secondary | ICD-10-CM | POA: Diagnosis not present

## 2019-01-21 DIAGNOSIS — M21621 Bunionette of right foot: Secondary | ICD-10-CM | POA: Diagnosis not present

## 2019-01-21 DIAGNOSIS — M65871 Other synovitis and tenosynovitis, right ankle and foot: Secondary | ICD-10-CM | POA: Diagnosis not present

## 2019-02-25 DIAGNOSIS — M5416 Radiculopathy, lumbar region: Secondary | ICD-10-CM | POA: Diagnosis not present

## 2019-02-26 DIAGNOSIS — M25551 Pain in right hip: Secondary | ICD-10-CM | POA: Diagnosis not present

## 2019-02-26 DIAGNOSIS — M545 Low back pain: Secondary | ICD-10-CM | POA: Diagnosis not present

## 2019-03-03 DIAGNOSIS — M545 Low back pain: Secondary | ICD-10-CM | POA: Diagnosis not present

## 2019-03-03 DIAGNOSIS — M25551 Pain in right hip: Secondary | ICD-10-CM | POA: Diagnosis not present

## 2019-03-05 DIAGNOSIS — I639 Cerebral infarction, unspecified: Secondary | ICD-10-CM | POA: Diagnosis not present

## 2019-03-05 DIAGNOSIS — M179 Osteoarthritis of knee, unspecified: Secondary | ICD-10-CM | POA: Diagnosis not present

## 2019-03-05 DIAGNOSIS — M25559 Pain in unspecified hip: Secondary | ICD-10-CM | POA: Diagnosis not present

## 2019-03-05 DIAGNOSIS — M545 Low back pain: Secondary | ICD-10-CM | POA: Diagnosis not present

## 2019-03-05 DIAGNOSIS — M5416 Radiculopathy, lumbar region: Secondary | ICD-10-CM | POA: Diagnosis not present

## 2019-03-05 DIAGNOSIS — M542 Cervicalgia: Secondary | ICD-10-CM | POA: Diagnosis not present

## 2019-03-05 DIAGNOSIS — M4696 Unspecified inflammatory spondylopathy, lumbar region: Secondary | ICD-10-CM | POA: Diagnosis not present

## 2019-03-05 DIAGNOSIS — G8929 Other chronic pain: Secondary | ICD-10-CM | POA: Insufficient documentation

## 2019-03-05 DIAGNOSIS — M79643 Pain in unspecified hand: Secondary | ICD-10-CM | POA: Diagnosis not present

## 2019-03-06 DIAGNOSIS — M545 Low back pain: Secondary | ICD-10-CM | POA: Diagnosis not present

## 2019-03-06 DIAGNOSIS — M25551 Pain in right hip: Secondary | ICD-10-CM | POA: Diagnosis not present

## 2019-03-10 DIAGNOSIS — M25551 Pain in right hip: Secondary | ICD-10-CM | POA: Diagnosis not present

## 2019-03-10 DIAGNOSIS — E1142 Type 2 diabetes mellitus with diabetic polyneuropathy: Secondary | ICD-10-CM | POA: Diagnosis not present

## 2019-03-10 DIAGNOSIS — M545 Low back pain: Secondary | ICD-10-CM | POA: Diagnosis not present

## 2019-03-12 DIAGNOSIS — M545 Low back pain: Secondary | ICD-10-CM | POA: Diagnosis not present

## 2019-03-12 DIAGNOSIS — M25551 Pain in right hip: Secondary | ICD-10-CM | POA: Diagnosis not present

## 2019-03-13 ENCOUNTER — Other Ambulatory Visit: Payer: Self-pay

## 2019-03-13 ENCOUNTER — Ambulatory Visit (INDEPENDENT_AMBULATORY_CARE_PROVIDER_SITE_OTHER): Payer: Medicare HMO | Admitting: Urology

## 2019-03-13 ENCOUNTER — Encounter: Payer: Self-pay | Admitting: Urology

## 2019-03-13 VITALS — BP 151/82 | HR 80 | Ht 63.0 in | Wt 130.0 lb

## 2019-03-13 DIAGNOSIS — N3281 Overactive bladder: Secondary | ICD-10-CM | POA: Diagnosis not present

## 2019-03-13 DIAGNOSIS — N3944 Nocturnal enuresis: Secondary | ICD-10-CM

## 2019-03-13 LAB — BLADDER SCAN AMB NON-IMAGING: Scan Result: 50

## 2019-03-13 NOTE — Progress Notes (Signed)
03/13/2019 8:56 AM   Elizabeth Rivera [birth year] [id number]  Referring provider: Kirk Ruths, MD [facility],  [place] [address]  Chief Complaint  Patient presents with  . Urinary Incontinence    HPI: Elizabeth Rivera is a 69 year old female with urge incontinence who presents today for follow up.  The patient is  experiencing urgency x 4-7 (stable), frequency x 4-7 (stable), is restricting fluids to avoid visits to the restroom, is engaging in toilet mapping, incontinence x 8 or more (worsened) and nocturia x 8 or more (worsened)  Her BP is 151/82.  Her PVR is 50 mL.      She is mostly bothered at night.  She is wearing a depends with a thick pad, pad underneath her and a towel on her bed at night.  She has episodes of volumes that go through all of her protective gear.  She is unaware of her leaking, she wakes up due to the wetness.  This occurs every night.  She stops her fluids at 5 pm.  She is on Myrbetriq 50 mg daily.  She is now drinking half and half tea.  She drinks this with her evening meal at 4:30 pm.  She has not been tested for sleep apnea.    She has also been having some intermittent right sided pain off and on for a few weeks.  She states the pain is mild.  She was found to have mild hydronephrosis on a 2017 CT scan.    Her most recent Hbg A1c 11.0 % on 12/03/2018.    Patient denies any modifying or aggravating factors.  Patient denies any gross hematuria or dysuria.  Patient denies any fevers, chills, nausea or vomiting.   PMH: Past Medical History:  Diagnosis Date  . Anemia   . Anemia of other chronic disease 09/01/2013  . Anxiety   . Arthritis    back s/p fracture x3 approx 1983  . Candidal vaginitis   . Diabetes mellitus without complication ([place])   . Diverticulosis of colon (without mention of hemorrhage) 05/20/2009   Internal hemms (Dr. Vira Agar)  . Forearm fracture    2013, left  . GERD (gastroesophageal reflux  disease)    reflux HH 09/01/2002//egd reflux Esoph. HH Gastritis nonbleeding erosive gastropathy Duodentitis 08/15/2006  . Hyperglycemia   . Hyperlipidemia   . Hypertension   . Insomnia, unspecified   . Irritable bowel syndrome    history of  . Nervous breakdown 08/2002   [facility]  . Neuropathy   . Other abnormal blood chemistry   . Other chest pain    chest discomfort  . Other esophagitis    erosive esophagitis  . Other specified disorders of adrenal glands 07/2007   adrenal mass via of CT 1.4cm left Adrenal no change(Dr.Cope)   . Other specified gastritis without mention of hemorrhage    erosvie gastritis  . Stroke ([place]) 03/2014  . Stroke ([place]) 03/2015  . Thyroid disease    hypothyroid, goiter  . Wears dentures    partial upper and lower.    Surgical History: Past Surgical History:  Procedure Laterality Date  . ABDOMINAL HYSTERECTOMY  1995   Part Hyst BSO DUB ovarian cyst B9  . BREAST CYST ASPIRATION Bilateral   . BREAST REDUCTION SURGERY  1984  . CHOLECYSTECTOMY  03/2004  . COLONOSCOPY  1. 08/15/06  2. 05/20/09   Int Hemms  2. Divertics Int Hemms (Dr. Vira Agar)  .  COLONOSCOPY WITH PROPOFOL N/A 03/05/2018   Procedure: COLONOSCOPY WITH PROPOFOL;  Surgeon: Lucilla Lame, MD;  Location: [facility] ENDOSCOPY;  Service: Endoscopy;  Laterality: N/A;  . CT ABD W & PELVIS WO CM  6/09   CT Scan abd stable adrenal adenoma 1.4cm left adrenal no change (Dr. Jacqlyn Larsen)  . CYSTOSCOPY  01/08/2008   Former site of inflammation healed (Dr. Jacqlyn Larsen)  . ESOPHAGOGASTRODUODENOSCOPY  1. 09/01/02  2. 08/15/06   1. Reflux, HH  2. Reflux esoph HH gaastritis nonbleeding erosive gastropathy duodenitis  . ESOPHAGOGASTRODUODENOSCOPY (EGD) WITH PROPOFOL N/A 06/01/2016   Procedure: ESOPHAGOGASTRODUODENOSCOPY (EGD) WITH PROPOFOL;  Surgeon: Lucilla Lame, MD;  Location: [place];  Service: Endoscopy;  Laterality: N/A;  diabetic - insulin and oral meds  . NSVD x1  1977  . REDUCTION MAMMAPLASTY  1985  . VAGINAL  DELIVERY  1977    Home Medications:  Allergies as of 03/13/2019      Reactions   Sulfa Antibiotics Rash, Anaphylaxis   And angioedmea      Medication List       Accurate as of March 13, 2019 11:59 PM. If you have any questions, ask your nurse or doctor.        ALPRAZolam 0.25 MG tablet Commonly known as: XANAX Take 1 tablet (0.25 mg total) by mouth 2 (two) times daily.   atorvastatin 40 MG tablet Commonly known as: LIPITOR Take 1 tablet (40 mg total) by mouth daily at 6 PM.   carvedilol 12.5 MG tablet Commonly known as: COREG carvedilol 12.5 mg tablet   cholecalciferol 1000 units tablet Commonly known as: VITAMIN D Take 1,000 Units by mouth daily.   cloNIDine 0.1 MG tablet Commonly known as: CATAPRES Take by mouth.   clopidogrel 75 MG tablet Commonly known as: PLAVIX Take 1 tablet (75 mg total) by mouth daily.   CRANBERRY PO Take by mouth daily.   Dulcolax 10 MG suppository Generic drug: bisacodyl Place 10 mg rectally as needed for moderate constipation.   famotidine 20 MG tablet Commonly known as: PEPCID TAKE 1 TABLET EVERY DAY   ferrous sulfate 325 (65 FE) MG tablet Commonly known as: [employer] Iron Take 1 tablet (325 mg total) by mouth daily with breakfast.   FreeStyle Libre 2 Reader [employer] Use 1 Device as directed   FreeStyle Libre 2 Sensor Systm Misc Use 1 kit every 14 (fourteen) days for glucose monitoring   gabapentin 100 MG capsule Commonly known as: NEURONTIN Take 200 mg by mouth 3 (three) times daily.   glimepiride 4 MG tablet Commonly known as: AMARYL glimepiride 4 mg tablet   hydrochlorothiazide 25 MG tablet Commonly known as: HYDRODIURIL Take 1 tablet (25 mg total) by mouth daily.   insulin aspart 100 UNIT/ML injection Commonly known as: novoLOG Inject 5 Units into the skin 3 (three) times daily before meals.   insulin glargine 100 UNIT/ML injection Commonly known as: LANTUS Inject 9 Units into the skin at bedtime.     levothyroxine 112 MCG tablet Commonly known as: SYNTHROID Take by mouth.   lisinopril 40 MG tablet Commonly known as: ZESTRIL Take 1 tablet (40 mg total) by mouth daily.   Melatonin 5 MG Tabs Take 5 mg by mouth at bedtime.   metFORMIN 1000 MG tablet Commonly known as: GLUCOPHAGE Take 0.5 tablets (500 mg total) by mouth 2 (two) times daily with a meal.   methocarbamol 500 MG tablet Commonly known as: ROBAXIN methocarbamol 500 mg tablet   metoprolol succinate 100 MG 24  hr tablet Commonly known as: TOPROL-XL Take 1 tablet (100 mg total) by mouth daily. Take with or immediately following a meal.   mirabegron ER 25 MG Tb24 tablet Commonly known as: MYRBETRIQ Take 1 tablet (25 mg total) by mouth daily.   mirtazapine 7.5 MG tablet Commonly known as: REMERON Take by mouth.   Na Sulfate-K Sulfate-Mg Sulf 17.5-3.13-1.6 GM/177ML Soln Commonly known as: Suprep Bowel Prep Kit Take 1 kit by mouth as directed.   pantoprazole 40 MG tablet Commonly known as: PROTONIX Take 1 tablet (40 mg total) by mouth 2 (two) times daily.   ranitidine 300 MG tablet Commonly known as: ZANTAC Take 1 tablet (300 mg total) by mouth at bedtime.   venlafaxine XR 150 MG 24 hr capsule Commonly known as: EFFEXOR-XR Take 1 capsule (150 mg total) by mouth 2 (two) times daily.   vitamin B-12 1000 MCG tablet Commonly known as: CYANOCOBALAMIN Take 1,000 mcg by mouth daily.       Allergies:  Allergies  Allergen Reactions  . Sulfa Antibiotics Rash and Anaphylaxis    And angioedmea    Family History: Family History  Problem Relation Age of Onset  . Hypertension Mother   . Hyperlipidemia Mother   . Stroke Mother        82  . Vision loss Father        vision problems  . Cancer Father        Colon   . Hyperlipidemia Sister   . Hypertension Sister   . Vision loss Sister   . Breast cancer Maternal Grandmother 27  . Breast cancer Cousin        paternal cousin  . Prostate cancer Paternal  Grandfather   . Bladder Cancer Neg Hx   . Kidney cancer Neg Hx     Social History:  reports that she has never smoked. She has never used smokeless tobacco. She reports that she does not drink alcohol or use drugs.  ROS: UROLOGY Frequent Urination?: Yes Hard to postpone urination?: No Burning/pain with urination?: No Get up at night to urinate?: Yes Leakage of urine?: No Urine stream starts and stops?: No Trouble starting stream?: No Do you have to strain to urinate?: No Blood in urine?: No Urinary tract infection?: No Sexually transmitted disease?: No Injury to kidneys or bladder?: No Painful intercourse?: No Weak stream?: No Currently pregnant?: No Vaginal bleeding?: No Last menstrual period?: n  Gastrointestinal Nausea?: No Vomiting?: No Indigestion/heartburn?: No Diarrhea?: No Constipation?: No  Constitutional Fever: No Night sweats?: No Weight loss?: No Fatigue?: No  Skin Skin rash/lesions?: No Itching?: No  Eyes Blurred vision?: No Double vision?: No  Ears/Nose/Throat Sore throat?: No Sinus problems?: No  Hematologic/Lymphatic Swollen glands?: No Easy bruising?: No  Cardiovascular Leg swelling?: No Chest pain?: No  Respiratory Cough?: No Shortness of breath?: No  Endocrine Excessive thirst?: No  Musculoskeletal Back pain?: Yes Joint pain?: No  Neurological Headaches?: No Dizziness?: No  Psychologic Depression?: Yes Anxiety?: Yes  Physical Exam: BP (!) 151/82   Pulse 80   Ht 5' 3"  (1.6 m)   Wt 130 lb (59 kg)   BMI 23.03 kg/m   Constitutional:  Well nourished. Alert and oriented, No acute distress. HEENT: [place] AT, mask in place.  Trachea midline, no masses. Cardiovascular: No clubbing, cyanosis, or edema. Respiratory: Normal respiratory effort, no increased work of breathing. GI: Abdomen is soft, non tender, non distended, no abdominal masses. Liver and spleen not palpable.  No hernias appreciated.  Stool  sample for occult  testing is not indicated.   GU: No CVA tenderness.  No bladder fullness or masses.  Atrophic external genitalia, sparse pubic hair distribution, no lesions.  Normal urethral meatus, no lesions, no prolapse, no discharge.   No urethral masses, tenderness and/or tenderness. No bladder fullness, tenderness or masses. Pale vagina mucosa, fair estrogen effect, no discharge, no lesions, good pelvic support, no cystocele and no rectocele noted.  Cervix, uterus and adnexa are surgically absent.  Anus and perineum are without rashes or lesions.    Skin: No rashes, bruises or suspicious lesions. Lymph: No inguinal adenopathy. Neurologic: Grossly intact, no focal deficits, moving all 4 extremities. Psychiatric: Normal mood and affect.   Laboratory Data: Lab Results  Component Value Date   WBC 4.8 01/04/2018   HGB 8.8 (L) 01/04/2018   HCT 27.5 (L) 01/04/2018   MCV 85.1 01/04/2018   PLT 156 01/04/2018    Lab Results  Component Value Date   CREATININE 1.25 (H) 01/04/2018    Lab Results  Component Value Date   HGBA1C 7.4 (H) 03/12/2015    Lab Results  Component Value Date   TSH 7.927 (H) 01/03/2018       Component Value Date/Time   CHOL 250 (H) 03/12/2015 0518   HDL 64 03/12/2015 0518   CHOLHDL 3.9 03/12/2015 0518   VLDL 26 03/12/2015 0518   LDLCALC 160 (H) 03/12/2015 0518    Lab Results  Component Value Date   AST 33 01/24/2016   Lab Results  Component Value Date   ALT 15 01/24/2016    Urinalysis Component     Latest Ref Rng & Units 03/13/2019  Specific Gravity, UA     1.005 - 1.030 1.010  pH, UA     5.0 - 7.5 5.5  Color, UA     Yellow Orange  Appearance Ur     Clear Clear  Leukocytes,UA     Negative Negative  Protein,UA     Negative/Trace Negative  Glucose, UA     Negative 1+ (A)  Ketones, UA     Negative Negative  RBC, UA     Negative Negative  Bilirubin, UA     Negative Negative  Urobilinogen, Ur     0.2 - 1.0 mg/dL 0.2  Nitrite, UA     Negative Positive  (A)  Microscopic Examination      See below:   Component     Latest Ref Rng & Units 03/13/2019  WBC, UA     0 - 5 /hpf 0-5  RBC     0 - 2 /hpf None seen  Epithelial Cells (non renal)     0 - 10 /hpf 0-10  Casts     None seen /lpf Present (A)  Cast Type     N/A Hyaline casts  Bacteria, UA     None seen/Few None seen   I have reviewed the labs.   Pertinent Imaging: Results for Elizabeth Rivera, Elizabeth "PAM" (MRN [id number]) as of 03/13/2019 11:50  Ref. Range 03/13/2019 11:44  Scan Result Unknown 50     Assessment & Plan:    1. Nocturia I have explained that research studies have showed that over 84% of patients with sleep apnea reported frequent nighttime urination and she may benefit from a discussion with his or her primary care physician to see if he or she has risk factors for sleep apnea or other sleep disturbances and obtaining a sleep study  2. Right flank pain Patient  with mild hydronephrosis on 2017 CT Will obtain a RUS on current status of her kidneys   Return in about 2 weeks (around 03/27/2019) for Ultrasound follow up.  These notes generated with voice recognition software. I apologize for typographical errors.  Zara Council, PA-C  [facility] [address]  [place],  [phone]

## 2019-03-14 LAB — MICROSCOPIC EXAMINATION
Bacteria, UA: NONE SEEN
RBC, Urine: NONE SEEN /hpf (ref 0–2)

## 2019-03-14 LAB — URINALYSIS, COMPLETE
Bilirubin, UA: NEGATIVE
Ketones, UA: NEGATIVE
Leukocytes,UA: NEGATIVE
Nitrite, UA: POSITIVE — AB
Protein,UA: NEGATIVE
RBC, UA: NEGATIVE
Specific Gravity, UA: 1.01 (ref 1.005–1.030)
Urobilinogen, Ur: 0.2 mg/dL (ref 0.2–1.0)
pH, UA: 5.5 (ref 5.0–7.5)

## 2019-03-17 DIAGNOSIS — M65312 Trigger thumb, left thumb: Secondary | ICD-10-CM | POA: Diagnosis not present

## 2019-03-17 DIAGNOSIS — M545 Low back pain: Secondary | ICD-10-CM | POA: Diagnosis not present

## 2019-03-17 DIAGNOSIS — M25551 Pain in right hip: Secondary | ICD-10-CM | POA: Diagnosis not present

## 2019-03-17 DIAGNOSIS — M65342 Trigger finger, left ring finger: Secondary | ICD-10-CM | POA: Diagnosis not present

## 2019-03-17 DIAGNOSIS — M5416 Radiculopathy, lumbar region: Secondary | ICD-10-CM | POA: Diagnosis not present

## 2019-03-24 DIAGNOSIS — M25551 Pain in right hip: Secondary | ICD-10-CM | POA: Diagnosis not present

## 2019-03-24 DIAGNOSIS — M545 Low back pain: Secondary | ICD-10-CM | POA: Diagnosis not present

## 2019-03-25 ENCOUNTER — Ambulatory Visit
Admission: RE | Admit: 2019-03-25 | Discharge: 2019-03-25 | Disposition: A | Discharge status: Home / Self Care | Payer: Medicare HMO | Source: Ambulatory Visit | Attending: Urology | Admitting: Urology

## 2019-03-25 ENCOUNTER — Other Ambulatory Visit: Payer: Self-pay

## 2019-03-25 DIAGNOSIS — N2889 Other specified disorders of kidney and ureter: Secondary | ICD-10-CM | POA: Diagnosis not present

## 2019-03-25 DIAGNOSIS — N3944 Nocturnal enuresis: Secondary | ICD-10-CM | POA: Insufficient documentation

## 2019-03-25 DIAGNOSIS — R93421 Abnormal radiologic findings on diagnostic imaging of right kidney: Secondary | ICD-10-CM | POA: Insufficient documentation

## 2019-03-25 DIAGNOSIS — R93422 Abnormal radiologic findings on diagnostic imaging of left kidney: Secondary | ICD-10-CM | POA: Diagnosis not present

## 2019-03-26 DIAGNOSIS — M545 Low back pain: Secondary | ICD-10-CM | POA: Diagnosis not present

## 2019-03-26 DIAGNOSIS — M25551 Pain in right hip: Secondary | ICD-10-CM | POA: Diagnosis not present

## 2019-03-27 ENCOUNTER — Ambulatory Visit: Payer: Medicare HMO | Admitting: Urology

## 2019-03-30 ENCOUNTER — Ambulatory Visit: Payer: Medicare HMO | Attending: Internal Medicine

## 2019-03-30 DIAGNOSIS — Z23 Encounter for immunization: Secondary | ICD-10-CM

## 2019-03-30 NOTE — Progress Notes (Signed)
   Covid-19 Vaccination Clinic  Name:  TENIQUA MARRON    MRN: [id number] DOB: [birth year]  03/30/2019  Ms. Rund was observed post Covid-19 immunization for 15 minutes without incidence. She was provided with Vaccine Information Sheet and instruction to access the V-Safe system.   Ms. Granlund was instructed to call 911 with any severe reactions post vaccine: Marland Kitchen Difficulty breathing  . Swelling of your face and throat  . A fast heartbeat  . A bad rash all over your body  . Dizziness and weakness    Immunizations Administered    Name Date Dose VIS Date Route   Pfizer COVID-19 Vaccine 03/30/2019  3:23 PM 0.3 mL 01/17/2019 Intramuscular   Manufacturer: [place]   Lot: [id number]   [place]: [id number]

## 2019-03-31 DIAGNOSIS — E083292 Diabetes mellitus due to underlying condition with mild nonproliferative diabetic retinopathy without macular edema, left eye: Secondary | ICD-10-CM | POA: Diagnosis not present

## 2019-03-31 DIAGNOSIS — E113292 Type 2 diabetes mellitus with mild nonproliferative diabetic retinopathy without macular edema, left eye: Secondary | ICD-10-CM | POA: Diagnosis not present

## 2019-03-31 DIAGNOSIS — H43813 Vitreous degeneration, bilateral: Secondary | ICD-10-CM | POA: Diagnosis not present

## 2019-03-31 DIAGNOSIS — H25813 Combined forms of age-related cataract, bilateral: Secondary | ICD-10-CM | POA: Diagnosis not present

## 2019-03-31 DIAGNOSIS — H524 Presbyopia: Secondary | ICD-10-CM | POA: Diagnosis not present

## 2019-04-01 ENCOUNTER — Ambulatory Visit: Payer: Medicare HMO | Admitting: Gastroenterology

## 2019-04-02 ENCOUNTER — Encounter: Payer: Self-pay | Admitting: Urology

## 2019-04-02 ENCOUNTER — Other Ambulatory Visit: Payer: Self-pay

## 2019-04-02 ENCOUNTER — Ambulatory Visit (INDEPENDENT_AMBULATORY_CARE_PROVIDER_SITE_OTHER): Payer: Medicare HMO | Admitting: Urology

## 2019-04-02 VITALS — BP 109/67 | HR 82 | Ht 63.0 in | Wt 126.8 lb

## 2019-04-02 DIAGNOSIS — R109 Unspecified abdominal pain: Secondary | ICD-10-CM | POA: Diagnosis not present

## 2019-04-02 DIAGNOSIS — N3944 Nocturnal enuresis: Secondary | ICD-10-CM

## 2019-04-02 NOTE — Patient Instructions (Addendum)
Try to stop one of the medications that you take at night (one of the gabapentin or reduce melatonin)  Reduce artifical sweeteners, sugary drinks, caffeine and carbonating drinks.  Only drink water after 1 pm.    Eat unsweetened yogurt with fruits for breakfast instead of cereal.

## 2019-04-09 DIAGNOSIS — M4696 Unspecified inflammatory spondylopathy, lumbar region: Secondary | ICD-10-CM | POA: Diagnosis not present

## 2019-04-09 DIAGNOSIS — M545 Low back pain: Secondary | ICD-10-CM | POA: Diagnosis not present

## 2019-04-09 DIAGNOSIS — M542 Cervicalgia: Secondary | ICD-10-CM | POA: Diagnosis not present

## 2019-04-09 DIAGNOSIS — M179 Osteoarthritis of knee, unspecified: Secondary | ICD-10-CM | POA: Diagnosis not present

## 2019-04-09 DIAGNOSIS — I639 Cerebral infarction, unspecified: Secondary | ICD-10-CM | POA: Diagnosis not present

## 2019-04-09 DIAGNOSIS — M79643 Pain in unspecified hand: Secondary | ICD-10-CM | POA: Diagnosis not present

## 2019-04-09 DIAGNOSIS — M25559 Pain in unspecified hip: Secondary | ICD-10-CM | POA: Diagnosis not present

## 2019-04-09 DIAGNOSIS — M5416 Radiculopathy, lumbar region: Secondary | ICD-10-CM | POA: Diagnosis not present

## 2019-04-10 DIAGNOSIS — M48062 Spinal stenosis, lumbar region with neurogenic claudication: Secondary | ICD-10-CM | POA: Diagnosis not present

## 2019-04-10 DIAGNOSIS — M4724 Other spondylosis with radiculopathy, thoracic region: Secondary | ICD-10-CM | POA: Diagnosis not present

## 2019-04-10 DIAGNOSIS — M545 Low back pain: Secondary | ICD-10-CM | POA: Diagnosis not present

## 2019-04-11 DIAGNOSIS — D225 Melanocytic nevi of trunk: Secondary | ICD-10-CM | POA: Diagnosis not present

## 2019-04-11 DIAGNOSIS — D2261 Melanocytic nevi of right upper limb, including shoulder: Secondary | ICD-10-CM | POA: Diagnosis not present

## 2019-04-11 DIAGNOSIS — L821 Other seborrheic keratosis: Secondary | ICD-10-CM | POA: Diagnosis not present

## 2019-04-11 DIAGNOSIS — D2272 Melanocytic nevi of left lower limb, including hip: Secondary | ICD-10-CM | POA: Diagnosis not present

## 2019-04-11 DIAGNOSIS — D2262 Melanocytic nevi of left upper limb, including shoulder: Secondary | ICD-10-CM | POA: Diagnosis not present

## 2019-04-11 DIAGNOSIS — D2271 Melanocytic nevi of right lower limb, including hip: Secondary | ICD-10-CM | POA: Diagnosis not present

## 2019-04-12 NOTE — Progress Notes (Signed)
04/02/2019 5:39 PM   Elizabeth Rivera 01/12/1951 [id number]  Referring provider: Kirk Ruths, MD [facility] [place],  [place] [address]  Chief Complaint  Patient presents with  . Urinary Incontinence    HPI: Elizabeth Rivera is a 69 year old female with urge incontinence, nocturia and right flank pain who presents today for follow up to review her renal ultrasound results with her husband, Elizabeth Rivera.    RUS 03/25/2019 Diffusely echogenic kidneys, compatible with medical renal disease.  No hydronephrosis.  She continues to have nocturia enuresis.  Her volume of leakage is severe.  Patient denies any modifying or aggravating factors.  Patient denies any gross hematuria, dysuria or suprapubic/flank pain.  Patient denies any fevers, chills, nausea or vomiting.   Reviewing her med list, she is taking Xanax, gabapentin and melatonin prior to bedtime.  She states she recently discontinued the Remeron.    She also admit that she does not adhere to a diabetic diet.   Her most recent Hbg A1c 11.0 % on 12/03/2018.     PMH: Past Medical History:  Diagnosis Date  . Anemia   . Anemia of other chronic disease 09/01/2013  . Anxiety   . Arthritis    back s/p fracture x3 approx 1983  . Candidal vaginitis   . Diabetes mellitus without complication ([place])   . Diverticulosis of colon (without mention of hemorrhage) 05/20/2009   Internal hemms (Dr. Vira Agar)  . Forearm fracture    2013, left  . GERD (gastroesophageal reflux disease)    reflux HH 09/01/2002//egd reflux Esoph. HH Gastritis nonbleeding erosive gastropathy Duodentitis 08/15/2006  . Hyperglycemia   . Hyperlipidemia   . Hypertension   . Insomnia, unspecified   . Irritable bowel syndrome    history of  . Nervous breakdown 08/2002   [facility]  . Neuropathy   . Other abnormal blood chemistry   . Other chest pain    chest discomfort  . Other esophagitis    erosive esophagitis  . Other  specified disorders of adrenal glands 07/2007   adrenal mass via of CT 1.4cm left Adrenal no change(Dr.Cope)   . Other specified gastritis without mention of hemorrhage    erosvie gastritis  . Stroke ([place]) 03/2014  . Stroke ([place]) 03/2015  . Thyroid disease    hypothyroid, goiter  . Wears dentures    partial upper and lower.    Surgical History: Past Surgical History:  Procedure Laterality Date  . ABDOMINAL HYSTERECTOMY  1995   Part Hyst BSO DUB ovarian cyst B9  . BREAST CYST ASPIRATION Bilateral   . BREAST REDUCTION SURGERY  1984  . CHOLECYSTECTOMY  03/2004  . COLONOSCOPY  1. 08/15/06  2. 05/20/09   Int Hemms  2. Divertics Int Hemms (Dr. Vira Agar)  . COLONOSCOPY WITH PROPOFOL N/A 03/05/2018   Procedure: COLONOSCOPY WITH PROPOFOL;  Surgeon: Lucilla Lame, MD;  Location: [facility] ENDOSCOPY;  Service: Endoscopy;  Laterality: N/A;  . CT ABD W & PELVIS WO CM  6/09   CT Scan abd stable adrenal adenoma 1.4cm left adrenal no change (Dr. Jacqlyn Larsen)  . CYSTOSCOPY  01/08/2008   Former site of inflammation healed (Dr. Jacqlyn Larsen)  . ESOPHAGOGASTRODUODENOSCOPY  1. 09/01/02  2. 08/15/06   1. Reflux, HH  2. Reflux esoph HH gaastritis nonbleeding erosive gastropathy duodenitis  . ESOPHAGOGASTRODUODENOSCOPY (EGD) WITH PROPOFOL N/A 06/01/2016   Procedure: ESOPHAGOGASTRODUODENOSCOPY (EGD) WITH PROPOFOL;  Surgeon: Lucilla Lame, MD;  Location: [facility];  Service: Endoscopy;  Laterality: N/A;  diabetic - insulin and oral meds  . NSVD x1  1977  . REDUCTION MAMMAPLASTY  1985  . VAGINAL DELIVERY  1977    Home Medications:  Allergies as of 04/02/2019      Reactions   Sulfa Antibiotics Rash, Anaphylaxis   And angioedmea      Medication List       Accurate as of April 02, 2019 11:59 PM. If you have any questions, ask your nurse or doctor.        STOP taking these medications   cloNIDine 0.1 MG tablet Commonly known as: CATAPRES Stopped by: Einar Nolasco, PA-C   lisinopril 40 MG tablet Commonly known  as: ZESTRIL Stopped by: Amahd Morino, PA-C   mirtazapine 7.5 MG tablet Commonly known as: REMERON Stopped by: Eli Adami, PA-C   ranitidine 300 MG tablet Commonly known as: ZANTAC Stopped by: Zara Council, PA-C     TAKE these medications   ALPRAZolam 0.25 MG tablet Commonly known as: XANAX Take 1 tablet (0.25 mg total) by mouth 2 (two) times daily.   atorvastatin 40 MG tablet Commonly known as: LIPITOR Take 1 tablet (40 mg total) by mouth daily at 6 PM.   carvedilol 12.5 MG tablet Commonly known as: COREG carvedilol 12.5 mg tablet   cholecalciferol 1000 units tablet Commonly known as: VITAMIN D Take 1,000 Units by mouth daily.   clopidogrel 75 MG tablet Commonly known as: PLAVIX Take 1 tablet (75 mg total) by mouth daily.   CRANBERRY PO Take by mouth daily.   Dulcolax 10 MG suppository Generic drug: bisacodyl Place 10 mg rectally as needed for moderate constipation.   famotidine 20 MG tablet Commonly known as: PEPCID TAKE 1 TABLET EVERY DAY   ferrous sulfate 325 (65 FE) MG tablet Commonly known as: [employer] Iron Take 1 tablet (325 mg total) by mouth daily with breakfast.   FreeStyle Libre 2 Reader [employer] Use 1 Device as directed   FreeStyle Libre 2 Sensor Systm Misc Use 1 kit every 14 (fourteen) days for glucose monitoring   gabapentin 100 MG capsule Commonly known as: NEURONTIN Take 200 mg by mouth 3 (three) times daily.   glimepiride 4 MG tablet Commonly known as: AMARYL glimepiride 4 mg tablet   hydrochlorothiazide 25 MG tablet Commonly known as: HYDRODIURIL Take 1 tablet (25 mg total) by mouth daily.   insulin aspart 100 UNIT/ML injection Commonly known as: novoLOG Inject 5 Units into the skin 3 (three) times daily before meals.   insulin glargine 100 UNIT/ML injection Commonly known as: LANTUS Inject 9 Units into the skin at bedtime.   levothyroxine 112 MCG tablet Commonly known as: SYNTHROID Take by mouth.   Melatonin 5 MG  Tabs Take 5 mg by mouth at bedtime.   metFORMIN 1000 MG tablet Commonly known as: GLUCOPHAGE Take 0.5 tablets (500 mg total) by mouth 2 (two) times daily with a meal.   methocarbamol 500 MG tablet Commonly known as: ROBAXIN methocarbamol 500 mg tablet   metoprolol succinate 100 MG 24 hr tablet Commonly known as: TOPROL-XL Take 1 tablet (100 mg total) by mouth daily. Take with or immediately following a meal.   mirabegron ER 25 MG Tb24 tablet Commonly known as: MYRBETRIQ Take 1 tablet (25 mg total) by mouth daily.   Na Sulfate-K Sulfate-Mg Sulf 17.5-3.13-1.6 GM/177ML Soln Commonly known as: Suprep Bowel Prep Kit Take 1 kit by mouth as directed.   pantoprazole 40 MG tablet Commonly known as: PROTONIX  Take 1 tablet (40 mg total) by mouth 2 (two) times daily.   venlafaxine XR 150 MG 24 hr capsule Commonly known as: EFFEXOR-XR Take 1 capsule (150 mg total) by mouth 2 (two) times daily.   vitamin B-12 1000 MCG tablet Commonly known as: CYANOCOBALAMIN Take 1,000 mcg by mouth daily.       Allergies:  Allergies  Allergen Reactions  . Sulfa Antibiotics Rash and Anaphylaxis    And angioedmea    Family History: Family History  Problem Relation Age of Onset  . Hypertension Mother   . Hyperlipidemia Mother   . Stroke Mother        23  . Vision loss Father        vision problems  . Cancer Father        Colon   . Hyperlipidemia Sister   . Hypertension Sister   . Vision loss Sister   . Breast cancer Maternal Grandmother 57  . Breast cancer Cousin        paternal cousin  . Prostate cancer Paternal Grandfather   . Bladder Cancer Neg Hx   . Kidney cancer Neg Hx     Social History:  reports that she has never smoked. She has never used smokeless tobacco. She reports that she does not drink alcohol or use drugs.  ROS: For pertinent review of systems please refer to history of present illness  Physical Exam: BP 109/67   Pulse 82   Ht _0  (1.6 m)   Wt 126 lb  12.8 oz (57.5 kg)   BMI 22.46 kg/m   Constitutional:  Well nourished. Alert and oriented, No acute distress. HEENT: [place] AT, mask in plave.  Trachea midline, no masses. Cardiovascular: No clubbing, cyanosis, or edema. Respiratory: Normal respiratory effort, no increased work of breathing. Neurologic: Grossly intact, no focal deficits, moving all 4 extremities. Psychiatric: Normal mood and affect.   Laboratory Data: Lab Results  Component Value Date   WBC 4.8 01/04/2018   HGB 8.8 (L) 01/04/2018   HCT 27.5 (L) 01/04/2018   MCV 85.1 01/04/2018   PLT 156 01/04/2018    Lab Results  Component Value Date   CREATININE 1.25 (H) 01/04/2018    Lab Results  Component Value Date   HGBA1C 7.4 (H) 03/12/2015    Lab Results  Component Value Date   TSH 7.927 (H) 01/03/2018       Component Value Date/Time   CHOL 250 (H) 03/12/2015 0518   HDL 64 03/12/2015 0518   CHOLHDL 3.9 03/12/2015 0518   VLDL 26 03/12/2015 0518   LDLCALC 160 (H) 03/12/2015 0518    Lab Results  Component Value Date   AST 33 01/24/2016   Lab Results  Component Value Date   ALT 15 01/24/2016    Urinalysis Component     Latest Ref Rng & Units 03/13/2019  Specific Gravity, UA     1.005 - 1.030 1.010  pH, UA     5.0 - 7.5 5.5  Color, UA     Yellow Orange  Appearance Ur     Clear Clear  Leukocytes,UA     Negative Negative  Protein,UA     Negative/Trace Negative  Glucose, UA     Negative 1+ (A)  Ketones, UA     Negative Negative  RBC, UA     Negative Negative  Bilirubin, UA     Negative Negative  Urobilinogen, Ur     0.2 - 1.0 mg/dL 0.2  Nitrite, UA  Negative Positive (A)  Microscopic Examination      See below:   Component     Latest Ref Rng & Units 03/13/2019  WBC, UA     0 - 5 /hpf 0-5  RBC     0 - 2 /hpf None seen  Epithelial Cells (non renal)     0 - 10 /hpf 0-10  Casts     None seen /lpf Present (A)  Cast Type     N/A Hyaline casts  Bacteria, UA     None seen/Few None seen     I have reviewed the labs.   Pertinent Imaging: Results for IVAH, GIRARDOT "PAM" (MRN [id number]) as of 03/13/2019 11:50  Ref. Range 03/13/2019 11:44  Scan Result Unknown 50     Assessment & Plan:    1. Nocturnal enuresis Encouraged her to reach out to her primary care physician for a sleep study Also discussed the medication she takes at night to sleep and have advised her to discontinue the gabapentin that she takes prior to bedtime as these medications are likely contributing to her nocturnal enuresis Offered referral to the lifestyle centers to review diabetic nutrition, the patient refused as she says she will likely not follow through with suggestions Advised her to reduce consumption of artificial sweeteners, sugary drinks, caffeine and carbonated drinks Advised her to only consume water after 1 PM Encouraged her to eat unsweetened yogurt with fruit for breakfast instead of cereal in order to reduce intake of sugary foods Continue Myrbetriq 50 mg daily She will return in 3 months for OAB questionnaire and PVR  2. Right flank pain No hydronephrosis seen on RUS Pain is not urological   Return in about 3 months (around 06/30/2019) for PVR and OAB questionnaire.  These notes generated with voice recognition software. I apologize for typographical errors.  Zara Council, PA-C  [facility] [address]  [place], [place] [address] [phone]  A total of 30 minutes were spent face-to-face with the patient, greater than 50% was spent in patient education, counseling, and coordination of care regarding how her medications and diet are contributing to her nocturnal enuresis

## 2019-04-14 DIAGNOSIS — Z794 Long term (current) use of insulin: Secondary | ICD-10-CM | POA: Diagnosis not present

## 2019-04-14 DIAGNOSIS — E1142 Type 2 diabetes mellitus with diabetic polyneuropathy: Secondary | ICD-10-CM | POA: Diagnosis not present

## 2019-04-14 DIAGNOSIS — E039 Hypothyroidism, unspecified: Secondary | ICD-10-CM | POA: Diagnosis not present

## 2019-04-18 DIAGNOSIS — E1142 Type 2 diabetes mellitus with diabetic polyneuropathy: Secondary | ICD-10-CM | POA: Diagnosis not present

## 2019-04-18 DIAGNOSIS — E039 Hypothyroidism, unspecified: Secondary | ICD-10-CM | POA: Diagnosis not present

## 2019-04-22 ENCOUNTER — Ambulatory Visit: Payer: Medicare HMO | Attending: Internal Medicine

## 2019-04-22 DIAGNOSIS — Z23 Encounter for immunization: Secondary | ICD-10-CM

## 2019-04-22 NOTE — Progress Notes (Signed)
   Covid-19 Vaccination Clinic  Name:  Elizabeth Rivera    MRN: [id number] DOB: [birth year]  04/22/2019  [name] was observed post Covid-19 immunization for 15 minutes without incident. She was provided with Vaccine Information Sheet and instruction to access the V-Safe system.   [name] was instructed to call 911 with any severe reactions post vaccine: Marland Kitchen Difficulty breathing  . Swelling of face and throat  . A fast heartbeat  . A bad rash all over body  . Dizziness and weakness   Immunizations Administered    Name Date Dose VIS Date Route   Pfizer COVID-19 Vaccine 04/22/2019  3:12 PM 0.3 mL 01/17/2019 Intramuscular   Manufacturer: [place]   Lot: [id number]   [place]: [id number]

## 2019-05-06 DIAGNOSIS — L909 Atrophic disorder of skin, unspecified: Secondary | ICD-10-CM | POA: Diagnosis not present

## 2019-05-06 DIAGNOSIS — M21621 Bunionette of right foot: Secondary | ICD-10-CM | POA: Diagnosis not present

## 2019-05-12 DIAGNOSIS — M25061 Hemarthrosis, right knee: Secondary | ICD-10-CM | POA: Diagnosis not present

## 2019-05-12 DIAGNOSIS — M25561 Pain in right knee: Secondary | ICD-10-CM | POA: Diagnosis not present

## 2019-05-12 DIAGNOSIS — M79642 Pain in left hand: Secondary | ICD-10-CM | POA: Diagnosis not present

## 2019-05-14 ENCOUNTER — Ambulatory Visit: Payer: Medicare HMO | Admitting: Gastroenterology

## 2019-05-19 DIAGNOSIS — M25561 Pain in right knee: Secondary | ICD-10-CM | POA: Diagnosis not present

## 2019-05-21 DIAGNOSIS — E049 Nontoxic goiter, unspecified: Secondary | ICD-10-CM | POA: Diagnosis not present

## 2019-05-21 DIAGNOSIS — I10 Essential (primary) hypertension: Secondary | ICD-10-CM | POA: Diagnosis not present

## 2019-05-21 DIAGNOSIS — E1159 Type 2 diabetes mellitus with other circulatory complications: Secondary | ICD-10-CM | POA: Diagnosis not present

## 2019-05-21 DIAGNOSIS — E278 Other specified disorders of adrenal gland: Secondary | ICD-10-CM | POA: Diagnosis not present

## 2019-05-21 DIAGNOSIS — E1142 Type 2 diabetes mellitus with diabetic polyneuropathy: Secondary | ICD-10-CM | POA: Diagnosis not present

## 2019-05-21 DIAGNOSIS — E785 Hyperlipidemia, unspecified: Secondary | ICD-10-CM | POA: Diagnosis not present

## 2019-05-21 DIAGNOSIS — E1169 Type 2 diabetes mellitus with other specified complication: Secondary | ICD-10-CM | POA: Diagnosis not present

## 2019-05-26 DIAGNOSIS — M25561 Pain in right knee: Secondary | ICD-10-CM | POA: Diagnosis not present

## 2019-05-29 DIAGNOSIS — E1142 Type 2 diabetes mellitus with diabetic polyneuropathy: Secondary | ICD-10-CM | POA: Diagnosis not present

## 2019-06-04 DIAGNOSIS — E1159 Type 2 diabetes mellitus with other circulatory complications: Secondary | ICD-10-CM | POA: Diagnosis not present

## 2019-06-04 DIAGNOSIS — E785 Hyperlipidemia, unspecified: Secondary | ICD-10-CM | POA: Diagnosis not present

## 2019-06-04 DIAGNOSIS — E1142 Type 2 diabetes mellitus with diabetic polyneuropathy: Secondary | ICD-10-CM | POA: Diagnosis not present

## 2019-06-04 DIAGNOSIS — I10 Essential (primary) hypertension: Secondary | ICD-10-CM | POA: Diagnosis not present

## 2019-06-04 DIAGNOSIS — R410 Disorientation, unspecified: Secondary | ICD-10-CM | POA: Diagnosis not present

## 2019-06-04 DIAGNOSIS — W19XXXS Unspecified fall, sequela: Secondary | ICD-10-CM | POA: Diagnosis not present

## 2019-06-04 DIAGNOSIS — I639 Cerebral infarction, unspecified: Secondary | ICD-10-CM | POA: Diagnosis not present

## 2019-06-04 DIAGNOSIS — E1169 Type 2 diabetes mellitus with other specified complication: Secondary | ICD-10-CM | POA: Diagnosis not present

## 2019-06-05 ENCOUNTER — Other Ambulatory Visit: Payer: Self-pay | Admitting: Internal Medicine

## 2019-06-05 ENCOUNTER — Ambulatory Visit: Payer: Medicare HMO | Admitting: Gastroenterology

## 2019-06-05 DIAGNOSIS — Z1231 Encounter for screening mammogram for malignant neoplasm of breast: Secondary | ICD-10-CM

## 2019-06-13 DIAGNOSIS — E049 Nontoxic goiter, unspecified: Secondary | ICD-10-CM | POA: Diagnosis not present

## 2019-06-13 DIAGNOSIS — E039 Hypothyroidism, unspecified: Secondary | ICD-10-CM | POA: Diagnosis not present

## 2019-06-13 DIAGNOSIS — E278 Other specified disorders of adrenal gland: Secondary | ICD-10-CM | POA: Diagnosis not present

## 2019-06-16 ENCOUNTER — Other Ambulatory Visit: Payer: Self-pay | Admitting: Gastroenterology

## 2019-06-26 DIAGNOSIS — M25561 Pain in right knee: Secondary | ICD-10-CM | POA: Diagnosis not present

## 2019-06-26 DIAGNOSIS — M65342 Trigger finger, left ring finger: Secondary | ICD-10-CM | POA: Diagnosis not present

## 2019-07-08 DIAGNOSIS — N184 Chronic kidney disease, stage 4 (severe): Secondary | ICD-10-CM | POA: Diagnosis not present

## 2019-07-08 DIAGNOSIS — E1142 Type 2 diabetes mellitus with diabetic polyneuropathy: Secondary | ICD-10-CM | POA: Diagnosis not present

## 2019-07-08 DIAGNOSIS — R29898 Other symptoms and signs involving the musculoskeletal system: Secondary | ICD-10-CM | POA: Diagnosis not present

## 2019-07-08 DIAGNOSIS — M542 Cervicalgia: Secondary | ICD-10-CM | POA: Diagnosis not present

## 2019-07-09 ENCOUNTER — Ambulatory Visit: Payer: Self-pay | Admitting: Urology

## 2019-07-10 ENCOUNTER — Ambulatory Visit
Admission: RE | Admit: 2019-07-10 | Discharge: 2019-07-10 | Disposition: A | Discharge status: Home / Self Care | Payer: Medicare HMO | Source: Ambulatory Visit | Attending: Internal Medicine | Admitting: Internal Medicine

## 2019-07-10 DIAGNOSIS — Z1231 Encounter for screening mammogram for malignant neoplasm of breast: Secondary | ICD-10-CM | POA: Insufficient documentation

## 2019-07-17 DIAGNOSIS — G4710 Hypersomnia, unspecified: Secondary | ICD-10-CM | POA: Diagnosis not present

## 2019-07-17 DIAGNOSIS — R5383 Other fatigue: Secondary | ICD-10-CM | POA: Diagnosis not present

## 2019-07-23 DIAGNOSIS — G4733 Obstructive sleep apnea (adult) (pediatric): Secondary | ICD-10-CM | POA: Diagnosis not present

## 2019-07-24 DIAGNOSIS — M5412 Radiculopathy, cervical region: Secondary | ICD-10-CM | POA: Diagnosis not present

## 2019-07-24 DIAGNOSIS — M25511 Pain in right shoulder: Secondary | ICD-10-CM | POA: Diagnosis not present

## 2019-07-28 DIAGNOSIS — Z1159 Encounter for screening for other viral diseases: Secondary | ICD-10-CM | POA: Diagnosis not present

## 2019-07-28 DIAGNOSIS — S6992XA Unspecified injury of left wrist, hand and finger(s), initial encounter: Secondary | ICD-10-CM | POA: Diagnosis not present

## 2019-07-28 DIAGNOSIS — M2550 Pain in unspecified joint: Secondary | ICD-10-CM | POA: Insufficient documentation

## 2019-07-28 DIAGNOSIS — Z1321 Encounter for screening for nutritional disorder: Secondary | ICD-10-CM | POA: Diagnosis not present

## 2019-07-28 DIAGNOSIS — M7989 Other specified soft tissue disorders: Secondary | ICD-10-CM | POA: Diagnosis not present

## 2019-07-28 DIAGNOSIS — M19041 Primary osteoarthritis, right hand: Secondary | ICD-10-CM | POA: Diagnosis not present

## 2019-07-28 DIAGNOSIS — R700 Elevated erythrocyte sedimentation rate: Secondary | ICD-10-CM | POA: Diagnosis not present

## 2019-07-28 DIAGNOSIS — M79642 Pain in left hand: Secondary | ICD-10-CM | POA: Diagnosis not present

## 2019-07-28 DIAGNOSIS — E559 Vitamin D deficiency, unspecified: Secondary | ICD-10-CM | POA: Diagnosis not present

## 2019-07-28 DIAGNOSIS — M47816 Spondylosis without myelopathy or radiculopathy, lumbar region: Secondary | ICD-10-CM | POA: Diagnosis not present

## 2019-07-28 DIAGNOSIS — N3000 Acute cystitis without hematuria: Secondary | ICD-10-CM | POA: Diagnosis not present

## 2019-07-28 DIAGNOSIS — E538 Deficiency of other specified B group vitamins: Secondary | ICD-10-CM | POA: Diagnosis not present

## 2019-08-05 ENCOUNTER — Other Ambulatory Visit: Payer: Self-pay

## 2019-08-05 ENCOUNTER — Ambulatory Visit: Payer: Medicare HMO | Admitting: Gastroenterology

## 2019-08-05 VITALS — BP 127/65 | HR 78 | Ht 63.0 in | Wt 116.6 lb

## 2019-08-05 DIAGNOSIS — R1310 Dysphagia, unspecified: Secondary | ICD-10-CM | POA: Diagnosis not present

## 2019-08-05 NOTE — H&P (View-Only) (Signed)
Primary Care Physician: Kirk Ruths, MD  Primary Gastroenterologist:  Dr. Lucilla Lame  Chief Complaint  Patient presents with  . Dysphagia    HPI: Elizabeth Rivera is a 69 y.o. female here for follow-up.  The patient is well-known to me due to constipation and a history of chronic colon problems.  The patient now reports that she is having dysphagia.  The patient had a upper endoscopy by me in the past with dilation with a dilator and she states that she felt better after that.  There is continued weight loss and she reports that she does not eat very much.  Past Medical History:  Diagnosis Date  . Anemia   . Anemia of other chronic disease 09/01/2013  . Anxiety   . Arthritis    back s/p fracture x3 approx 1983  . Candidal vaginitis   . Diabetes mellitus without complication ([place])   . Diverticulosis of colon (without mention of hemorrhage) 05/20/2009   Internal hemms (Dr. Vira Agar)  . Forearm fracture    2013, left  . GERD (gastroesophageal reflux disease)    reflux HH 09/01/2002//egd reflux Esoph. HH Gastritis nonbleeding erosive gastropathy Duodentitis 08/15/2006  . Hyperglycemia   . Hyperlipidemia   . Hypertension   . Insomnia, unspecified   . Irritable bowel syndrome    history of  . Nervous breakdown 08/2002   [facility]  . Neuropathy   . Other abnormal blood chemistry   . Other chest pain    chest discomfort  . Other esophagitis    erosive esophagitis  . Other specified disorders of adrenal glands 07/2007   adrenal mass via of CT 1.4cm left Adrenal no change(Dr.Cope)   . Other specified gastritis without mention of hemorrhage    erosvie gastritis  . Stroke ([place]) 03/2014  . Stroke ([place]) 03/2015  . Thyroid disease    hypothyroid, goiter  . Wears dentures    partial upper and lower.    Current Outpatient Medications  Medication Sig Dispense Refill  . atorvastatin (LIPITOR) 40 MG tablet Take 1 tablet (40 mg total) by mouth daily at 6 PM. 90 tablet 3   . bisacodyl (DULCOLAX) 10 MG suppository Place 10 mg rectally as needed for moderate constipation.    . carvedilol (COREG) 12.5 MG tablet carvedilol 12.5 mg tablet    . cholecalciferol (VITAMIN D) 1000 units tablet Take 1,000 Units by mouth daily.    . clopidogrel (PLAVIX) 75 MG tablet Take 1 tablet (75 mg total) by mouth daily. 90 tablet 1  . Continuous Blood Gluc Receiver (FREESTYLE LIBRE 2 READER SYSTM) DEVI Use 1 Device as directed    . Continuous Blood Gluc Sensor (FREESTYLE LIBRE 2 SENSOR SYSTM) MISC Use 1 kit every 14 (fourteen) days for glucose monitoring    . CRANBERRY PO Take by mouth daily.    . DROPLET PEN NEEDLES 31G X 5 MM MISC     . famotidine (PEPCID) 20 MG tablet TAKE 1 TABLET EVERY DAY 90 tablet 1  . FERREX 150 150 MG capsule     . ferrous sulfate (CVS IRON) 325 (65 FE) MG tablet Take 1 tablet (325 mg total) by mouth daily with breakfast.    . gabapentin (NEURONTIN) 100 MG capsule Take 200 mg by mouth 3 (three) times daily.     Marland Kitchen glimepiride (AMARYL) 4 MG tablet glimepiride 4 mg tablet    . hydrochlorothiazide (HYDRODIURIL) 25 MG tablet Take 1 tablet (25 mg total) by mouth daily. [place]  tablet 1  . insulin aspart (NOVOLOG) 100 UNIT/ML injection Inject 5 Units into the skin 3 (three) times daily before meals.    . insulin glargine (LANTUS) 100 UNIT/ML injection Inject 9 Units into the skin at bedtime.    Marland Kitchen levothyroxine (SYNTHROID) 100 MCG tablet     . levothyroxine (SYNTHROID, LEVOTHROID) 112 MCG tablet Take by mouth.    Marland Kitchen lisinopril (ZESTRIL) 40 MG tablet Take by mouth.    . Melatonin 5 MG TABS Take 5 mg by mouth at bedtime.    . metFORMIN (GLUCOPHAGE) 1000 MG tablet Take 0.5 tablets (500 mg total) by mouth 2 (two) times daily with a meal. 90 tablet 3  . metFORMIN (GLUCOPHAGE) 500 MG tablet     . methocarbamol (ROBAXIN) 500 MG tablet methocarbamol 500 mg tablet    . metoprolol succinate (TOPROL-XL) 100 MG 24 hr tablet Take 1 tablet (100 mg total) by mouth daily. Take with or  immediately following a meal. 90 tablet 3  . mirabegron ER (MYRBETRIQ) 25 MG TB24 tablet Take 1 tablet (25 mg total) by mouth daily. 30 tablet 11  . mirtazapine (REMERON) 7.5 MG tablet     . Na Sulfate-K Sulfate-Mg Sulf (SUPREP BOWEL PREP KIT) 17.5-3.13-1.6 GM/177ML SOLN Take 1 kit by mouth as directed. 1 Bottle 0  . pantoprazole (PROTONIX) 40 MG tablet Take 1 tablet (40 mg total) by mouth 2 (two) times daily. 180 tablet 1  . predniSONE (DELTASONE) 10 MG tablet     . tiZANidine (ZANAFLEX) 2 MG tablet     . venlafaxine XR (EFFEXOR-XR) 150 MG 24 hr capsule Take 1 capsule (150 mg total) by mouth 2 (two) times daily. 180 capsule 3  . vitamin B-12 (CYANOCOBALAMIN) 1000 MCG tablet Take 1,000 mcg by mouth daily.     Marland Kitchen ALPRAZolam (XANAX) 0.25 MG tablet Take 1 tablet (0.25 mg total) by mouth 2 (two) times daily. 30 tablet 0   No current facility-administered medications for this visit.    Allergies as of 08/05/2019 - Review Complete 08/05/2019  Allergen Reaction Noted  . Sulfa antibiotics Rash and Anaphylaxis 07/08/2012    ROS:  General: Negative for anorexia, weight loss, fever, chills, fatigue, weakness. ENT: Negative for hoarseness, difficulty swallowing , nasal congestion. CV: Negative for chest pain, angina, palpitations, dyspnea on exertion, peripheral edema.  Respiratory: Negative for dyspnea at rest, dyspnea on exertion, cough, sputum, wheezing.  GI: See history of present illness. GU:  Negative for dysuria, hematuria, urinary incontinence, urinary frequency, nocturnal urination.  Endo: Negative for unusual weight change.    Physical Examination:   BP 127/65   Pulse 78   Ht _0  (1.6 m)   Wt 116 lb 9.6 oz (52.9 kg)   BMI 20.65 kg/m   General: Well-nourished, well-developed in no acute distress.  Eyes: No icterus. Conjunctivae pink. Lungs: Clear to auscultation bilaterally. Non-labored. Heart: Regular rate and rhythm, no murmurs rubs or gallops.  Abdomen: Bowel sounds are  normal, nontender, nondistended, no hepatosplenomegaly or masses, no abdominal bruits or hernia , no rebound or guarding.   Extremities: No lower extremity edema. No clubbing or deformities. Neuro: Alert and oriented x 3.  Grossly intact. Skin: Warm and dry, no jaundice.   Psych: Alert and cooperative, normal mood and affect.  Labs:    Imaging Studies: MM 3D SCREEN BREAST BILATERAL  Result Date: 07/11/2019 CLINICAL DATA:  Screening. EXAM: DIGITAL SCREENING BILATERAL MAMMOGRAM WITH TOMO AND CAD COMPARISON:  Previous exam(s). ACR Breast Density Category b: There  are scattered areas of fibroglandular density. FINDINGS: There are no findings suspicious for malignancy. Images were processed with CAD. IMPRESSION: No mammographic evidence of malignancy. A result letter of this screening mammogram will be mailed directly to the patient. RECOMMENDATION: Screening mammogram in one year. (Code:SM-B-01Y) BI-RADS CATEGORY  1: Negative. Electronically Signed   By: Lillia Mountain M.D.   On: 07/11/2019 12:28    Assessment and Plan:   Elizabeth Rivera is a 69 y.o. y/o female who comes in for a history of dysphagia.  She had a normal EGD but felt better after dilation of her esophagus at the last endoscopy.  The patient will be set up for a repeat upper endoscopy with possible dilation.  The patient has been explained the plan agrees with it.     Lucilla Lame, MD. [facility]    Note: This dictation was prepared with Dragon dictation along with smaller phrase technology. Any transcriptional errors that result from this process are unintentional.

## 2019-08-05 NOTE — Progress Notes (Signed)
Primary Care Physician: Kirk Ruths, MD  Primary Gastroenterologist:  Dr. Lucilla Lame  Chief Complaint  Patient presents with  . Dysphagia    HPI: Elizabeth Rivera is a 69 y.o. female here for follow-up.  The patient is well-known to me due to constipation and a history of chronic colon problems.  The patient now reports that she is having dysphagia.  The patient had a upper endoscopy by me in the past with dilation with a dilator and she states that she felt better after that.  There is continued weight loss and she reports that she does not eat very much.  Past Medical History:  Diagnosis Date  . Anemia   . Anemia of other chronic disease 09/01/2013  . Anxiety   . Arthritis    back s/p fracture x3 approx 1983  . Candidal vaginitis   . Diabetes mellitus without complication ([place])   . Diverticulosis of colon (without mention of hemorrhage) 05/20/2009   Internal hemms (Dr. Vira Agar)  . Forearm fracture    2013, left  . GERD (gastroesophageal reflux disease)    reflux HH 09/01/2002//egd reflux Esoph. HH Gastritis nonbleeding erosive gastropathy Duodentitis 08/15/2006  . Hyperglycemia   . Hyperlipidemia   . Hypertension   . Insomnia, unspecified   . Irritable bowel syndrome    history of  . Nervous breakdown 08/2002   [facility]  . Neuropathy   . Other abnormal blood chemistry   . Other chest pain    chest discomfort  . Other esophagitis    erosive esophagitis  . Other specified disorders of adrenal glands 07/2007   adrenal mass via of CT 1.4cm left Adrenal no change(Dr.Cope)   . Other specified gastritis without mention of hemorrhage    erosvie gastritis  . Stroke ([place]) 03/2014  . Stroke ([place]) 03/2015  . Thyroid disease    hypothyroid, goiter  . Wears dentures    partial upper and lower.    Current Outpatient Medications  Medication Sig Dispense Refill  . atorvastatin (LIPITOR) 40 MG tablet Take 1 tablet (40 mg total) by mouth daily at 6 PM. 90 tablet 3   . bisacodyl (DULCOLAX) 10 MG suppository Place 10 mg rectally as needed for moderate constipation.    . carvedilol (COREG) 12.5 MG tablet carvedilol 12.5 mg tablet    . cholecalciferol (VITAMIN D) 1000 units tablet Take 1,000 Units by mouth daily.    . clopidogrel (PLAVIX) 75 MG tablet Take 1 tablet (75 mg total) by mouth daily. 90 tablet 1  . Continuous Blood Gluc Receiver (FREESTYLE LIBRE 2 READER SYSTM) DEVI Use 1 Device as directed    . Continuous Blood Gluc Sensor (FREESTYLE LIBRE 2 SENSOR SYSTM) MISC Use 1 kit every 14 (fourteen) days for glucose monitoring    . CRANBERRY PO Take by mouth daily.    . DROPLET PEN NEEDLES 31G X 5 MM MISC     . famotidine (PEPCID) 20 MG tablet TAKE 1 TABLET EVERY DAY 90 tablet 1  . FERREX 150 150 MG capsule     . ferrous sulfate ([employer] IRON) 325 (65 FE) MG tablet Take 1 tablet (325 mg total) by mouth daily with breakfast.    . gabapentin (NEURONTIN) 100 MG capsule Take 200 mg by mouth 3 (three) times daily.     Marland Kitchen glimepiride (AMARYL) 4 MG tablet glimepiride 4 mg tablet    . hydrochlorothiazide (HYDRODIURIL) 25 MG tablet Take 1 tablet (25 mg total) by mouth daily. [place]  tablet 1  . insulin aspart (NOVOLOG) 100 UNIT/ML injection Inject 5 Units into the skin 3 (three) times daily before meals.    . insulin glargine (LANTUS) 100 UNIT/ML injection Inject 9 Units into the skin at bedtime.    Marland Kitchen levothyroxine (SYNTHROID) 100 MCG tablet     . levothyroxine (SYNTHROID, LEVOTHROID) 112 MCG tablet Take by mouth.    Marland Kitchen lisinopril (ZESTRIL) 40 MG tablet Take by mouth.    . Melatonin 5 MG TABS Take 5 mg by mouth at bedtime.    . metFORMIN (GLUCOPHAGE) 1000 MG tablet Take 0.5 tablets (500 mg total) by mouth 2 (two) times daily with a meal. 90 tablet 3  . metFORMIN (GLUCOPHAGE) 500 MG tablet     . methocarbamol (ROBAXIN) 500 MG tablet methocarbamol 500 mg tablet    . metoprolol succinate (TOPROL-XL) 100 MG 24 hr tablet Take 1 tablet (100 mg total) by mouth daily. Take with or  immediately following a meal. 90 tablet 3  . mirabegron ER (MYRBETRIQ) 25 MG TB24 tablet Take 1 tablet (25 mg total) by mouth daily. 30 tablet 11  . mirtazapine (REMERON) 7.5 MG tablet     . Na Sulfate-K Sulfate-Mg Sulf (SUPREP BOWEL PREP KIT) 17.5-3.13-1.6 GM/177ML SOLN Take 1 kit by mouth as directed. 1 Bottle 0  . pantoprazole (PROTONIX) 40 MG tablet Take 1 tablet (40 mg total) by mouth 2 (two) times daily. 180 tablet 1  . predniSONE (DELTASONE) 10 MG tablet     . tiZANidine (ZANAFLEX) 2 MG tablet     . venlafaxine XR (EFFEXOR-XR) 150 MG 24 hr capsule Take 1 capsule (150 mg total) by mouth 2 (two) times daily. 180 capsule 3  . vitamin B-12 (CYANOCOBALAMIN) 1000 MCG tablet Take 1,000 mcg by mouth daily.     Marland Kitchen ALPRAZolam (XANAX) 0.25 MG tablet Take 1 tablet (0.25 mg total) by mouth 2 (two) times daily. 30 tablet 0   No current facility-administered medications for this visit.    Allergies as of 08/05/2019 - Review Complete 08/05/2019  Allergen Reaction Noted  . Sulfa antibiotics Rash and Anaphylaxis 07/08/2012    ROS:  General: Negative for anorexia, weight loss, fever, chills, fatigue, weakness. ENT: Negative for hoarseness, difficulty swallowing , nasal congestion. CV: Negative for chest pain, angina, palpitations, dyspnea on exertion, peripheral edema.  Respiratory: Negative for dyspnea at rest, dyspnea on exertion, cough, sputum, wheezing.  GI: See history of present illness. GU:  Negative for dysuria, hematuria, urinary incontinence, urinary frequency, nocturnal urination.  Endo: Negative for unusual weight change.    Physical Examination:   BP 127/65   Pulse 78   Ht _0  (1.6 m)   Wt 116 lb 9.6 oz (52.9 kg)   BMI 20.65 kg/m   General: Well-nourished, well-developed in no acute distress.  Eyes: No icterus. Conjunctivae pink. Lungs: Clear to auscultation bilaterally. Non-labored. Heart: Regular rate and rhythm, no murmurs rubs or gallops.  Abdomen: Bowel sounds are  normal, nontender, nondistended, no hepatosplenomegaly or masses, no abdominal bruits or hernia , no rebound or guarding.   Extremities: No lower extremity edema. No clubbing or deformities. Neuro: Alert and oriented x 3.  Grossly intact. Skin: Warm and dry, no jaundice.   Psych: Alert and cooperative, normal mood and affect.  Labs:    Imaging Studies: MM 3D SCREEN BREAST BILATERAL  Result Date: 07/11/2019 CLINICAL DATA:  Screening. EXAM: DIGITAL SCREENING BILATERAL MAMMOGRAM WITH TOMO AND CAD COMPARISON:  Previous exam(s). ACR Breast Density Category b: There  are scattered areas of fibroglandular density. FINDINGS: There are no findings suspicious for malignancy. Images were processed with CAD. IMPRESSION: No mammographic evidence of malignancy. A result letter of this screening mammogram will be mailed directly to the patient. RECOMMENDATION: Screening mammogram in one year. (Code:SM-B-01Y) BI-RADS CATEGORY  1: Negative. Electronically Signed   By: Lillia Mountain M.D.   On: 07/11/2019 12:28    Assessment and Plan:   Elizabeth Rivera is a 69 y.o. y/o female who comes in for a history of dysphagia.  She had a normal EGD but felt better after dilation of her esophagus at the last endoscopy.  The patient will be set up for a repeat upper endoscopy with possible dilation.  The patient has been explained the plan agrees with it.     Lucilla Lame, MD. Marval Regal    Note: This dictation was prepared with Dragon dictation along with smaller phrase technology. Any transcriptional errors that result from this process are unintentional.

## 2019-08-06 ENCOUNTER — Other Ambulatory Visit: Payer: Self-pay

## 2019-08-06 DIAGNOSIS — R1310 Dysphagia, unspecified: Secondary | ICD-10-CM

## 2019-08-08 DIAGNOSIS — D649 Anemia, unspecified: Secondary | ICD-10-CM | POA: Diagnosis not present

## 2019-08-14 ENCOUNTER — Other Ambulatory Visit: Payer: Self-pay

## 2019-08-14 ENCOUNTER — Emergency Department: Payer: Medicare HMO

## 2019-08-14 ENCOUNTER — Other Ambulatory Visit
Admission: RE | Admit: 2019-08-14 | Discharge: 2019-08-14 | Disposition: A | Discharge status: Home / Self Care | Payer: Medicare HMO | Source: Ambulatory Visit | Attending: Pediatrics | Admitting: Pediatrics

## 2019-08-14 ENCOUNTER — Encounter: Payer: Self-pay | Admitting: Emergency Medicine

## 2019-08-14 ENCOUNTER — Emergency Department
Admission: EM | Admit: 2019-08-14 | Discharge: 2019-08-14 | Disposition: A | Discharge status: Home / Self Care | Payer: Medicare HMO | Attending: Emergency Medicine | Admitting: Emergency Medicine

## 2019-08-14 ENCOUNTER — Emergency Department
Admit: 2019-08-14 | Discharge: 2019-08-14 | Disposition: A | Discharge status: Home / Self Care | Payer: Medicare HMO | Attending: Internal Medicine | Admitting: Internal Medicine

## 2019-08-14 DIAGNOSIS — M25572 Pain in left ankle and joints of left foot: Secondary | ICD-10-CM | POA: Diagnosis not present

## 2019-08-14 DIAGNOSIS — M7989 Other specified soft tissue disorders: Secondary | ICD-10-CM | POA: Diagnosis not present

## 2019-08-14 DIAGNOSIS — M79662 Pain in left lower leg: Secondary | ICD-10-CM | POA: Diagnosis not present

## 2019-08-14 DIAGNOSIS — D649 Anemia, unspecified: Secondary | ICD-10-CM | POA: Diagnosis not present

## 2019-08-14 DIAGNOSIS — R2243 Localized swelling, mass and lump, lower limb, bilateral: Secondary | ICD-10-CM | POA: Insufficient documentation

## 2019-08-14 DIAGNOSIS — R0602 Shortness of breath: Secondary | ICD-10-CM | POA: Diagnosis not present

## 2019-08-14 DIAGNOSIS — Z5321 Procedure and treatment not carried out due to patient leaving prior to being seen by health care provider: Secondary | ICD-10-CM | POA: Diagnosis not present

## 2019-08-14 DIAGNOSIS — R8290 Unspecified abnormal findings in urine: Secondary | ICD-10-CM | POA: Diagnosis not present

## 2019-08-14 DIAGNOSIS — N390 Urinary tract infection, site not specified: Secondary | ICD-10-CM | POA: Diagnosis not present

## 2019-08-14 DIAGNOSIS — R600 Localized edema: Secondary | ICD-10-CM | POA: Insufficient documentation

## 2019-08-14 DIAGNOSIS — M79605 Pain in left leg: Secondary | ICD-10-CM | POA: Diagnosis not present

## 2019-08-14 LAB — BASIC METABOLIC PANEL
Anion gap: 10 (ref 5–15)
BUN: 40 mg/dL — ABNORMAL HIGH (ref 8–23)
CO2: 26 mmol/L (ref 22–32)
Calcium: 9.4 mg/dL (ref 8.9–10.3)
Chloride: 94 mmol/L — ABNORMAL LOW (ref 98–111)
Creatinine, Ser: 1.45 mg/dL — ABNORMAL HIGH (ref 0.44–1.00)
GFR calc Af Amer: 43 mL/min — ABNORMAL LOW (ref >60)
GFR calc non Af Amer: 37 mL/min — ABNORMAL LOW (ref >60)
Glucose, Bld: 197 mg/dL — ABNORMAL HIGH (ref 70–99)
Potassium: 4.5 mmol/L (ref 3.5–5.1)
Sodium: 130 mmol/L — ABNORMAL LOW (ref 135–145)

## 2019-08-14 LAB — CBC
HCT: 25.5 % — ABNORMAL LOW (ref 36.0–46.0)
Hemoglobin: 8.3 g/dL — ABNORMAL LOW (ref 12.0–15.0)
MCH: 26.4 pg (ref 26.0–34.0)
MCHC: 32.5 g/dL (ref 30.0–36.0)
MCV: 81.2 fL (ref 80.0–100.0)
Platelets: 159 10*3/uL (ref 150–400)
RBC: 3.14 MIL/uL — ABNORMAL LOW (ref 3.87–5.11)
RDW: 14.6 % (ref 11.5–15.5)
WBC: 5.2 10*3/uL (ref 4.0–10.5)
nRBC: 0 % (ref 0.0–0.2)

## 2019-08-14 LAB — BRAIN NATRIURETIC PEPTIDE
B Natriuretic Peptide: 202 pg/mL — ABNORMAL HIGH (ref 0.0–100.0)
B Natriuretic Peptide: 181.5 pg/mL — ABNORMAL HIGH (ref 0.0–100.0)

## 2019-08-14 NOTE — ED Notes (Signed)
[place] tech called and reported the patient would not be returning to the ED after her [place] was complete instead would be going to see her PMD

## 2019-08-14 NOTE — ED Triage Notes (Signed)
Patient reports swelling in bilateral ankles for the last few weeks. States over the last few days she has had worsening swelling and pain to the left calf. History of blood clots. Patient taking plavix

## 2019-08-14 NOTE — ED Notes (Signed)
Pt went to [place] and once [place] was completed the [place] tech called and reported that the pt would not be coming back to the ED because she was going to her PMD office.

## 2019-08-15 ENCOUNTER — Other Ambulatory Visit
Admission: RE | Admit: 2019-08-15 | Discharge: 2019-08-15 | Disposition: A | Discharge status: Home / Self Care | Payer: Medicare HMO | Source: Ambulatory Visit | Attending: Gastroenterology | Admitting: Gastroenterology

## 2019-08-15 DIAGNOSIS — Z20822 Contact with and (suspected) exposure to covid-19: Secondary | ICD-10-CM | POA: Insufficient documentation

## 2019-08-15 DIAGNOSIS — R8290 Unspecified abnormal findings in urine: Secondary | ICD-10-CM | POA: Diagnosis not present

## 2019-08-15 DIAGNOSIS — Z01812 Encounter for preprocedural laboratory examination: Secondary | ICD-10-CM | POA: Insufficient documentation

## 2019-08-16 LAB — SARS CORONAVIRUS 2 (TAT 6-24 HRS): SARS Coronavirus 2: NEGATIVE

## 2019-08-18 DIAGNOSIS — M2550 Pain in unspecified joint: Secondary | ICD-10-CM | POA: Diagnosis not present

## 2019-08-18 DIAGNOSIS — R7982 Elevated C-reactive protein (CRP): Secondary | ICD-10-CM | POA: Diagnosis not present

## 2019-08-18 DIAGNOSIS — E1142 Type 2 diabetes mellitus with diabetic polyneuropathy: Secondary | ICD-10-CM | POA: Diagnosis not present

## 2019-08-18 DIAGNOSIS — R700 Elevated erythrocyte sedimentation rate: Secondary | ICD-10-CM | POA: Diagnosis not present

## 2019-08-18 DIAGNOSIS — R251 Tremor, unspecified: Secondary | ICD-10-CM | POA: Diagnosis not present

## 2019-08-19 ENCOUNTER — Encounter: Payer: Self-pay | Admitting: Gastroenterology

## 2019-08-19 ENCOUNTER — Ambulatory Visit: Payer: Medicare HMO | Admitting: Certified Registered"

## 2019-08-19 ENCOUNTER — Encounter
Admission: RE | Disposition: A | Discharge status: Home / Self Care | Payer: Self-pay | Source: Home / Self Care | Attending: Gastroenterology

## 2019-08-19 ENCOUNTER — Ambulatory Visit
Admission: RE | Admit: 2019-08-19 | Discharge: 2019-08-19 | Disposition: A | Discharge status: Home / Self Care | Payer: Medicare HMO | Attending: Gastroenterology | Admitting: Gastroenterology

## 2019-08-19 ENCOUNTER — Other Ambulatory Visit: Payer: Self-pay

## 2019-08-19 DIAGNOSIS — I10 Essential (primary) hypertension: Secondary | ICD-10-CM | POA: Insufficient documentation

## 2019-08-19 DIAGNOSIS — Z7902 Long term (current) use of antithrombotics/antiplatelets: Secondary | ICD-10-CM | POA: Insufficient documentation

## 2019-08-19 DIAGNOSIS — F419 Anxiety disorder, unspecified: Secondary | ICD-10-CM | POA: Insufficient documentation

## 2019-08-19 DIAGNOSIS — Z8673 Personal history of transient ischemic attack (TIA), and cerebral infarction without residual deficits: Secondary | ICD-10-CM | POA: Diagnosis not present

## 2019-08-19 DIAGNOSIS — E785 Hyperlipidemia, unspecified: Secondary | ICD-10-CM | POA: Insufficient documentation

## 2019-08-19 DIAGNOSIS — Z794 Long term (current) use of insulin: Secondary | ICD-10-CM | POA: Diagnosis not present

## 2019-08-19 DIAGNOSIS — K219 Gastro-esophageal reflux disease without esophagitis: Secondary | ICD-10-CM | POA: Insufficient documentation

## 2019-08-19 DIAGNOSIS — Z79899 Other long term (current) drug therapy: Secondary | ICD-10-CM | POA: Diagnosis not present

## 2019-08-19 DIAGNOSIS — M1990 Unspecified osteoarthritis, unspecified site: Secondary | ICD-10-CM | POA: Insufficient documentation

## 2019-08-19 DIAGNOSIS — K589 Irritable bowel syndrome without diarrhea: Secondary | ICD-10-CM | POA: Insufficient documentation

## 2019-08-19 DIAGNOSIS — E039 Hypothyroidism, unspecified: Secondary | ICD-10-CM | POA: Diagnosis not present

## 2019-08-19 DIAGNOSIS — Z7952 Long term (current) use of systemic steroids: Secondary | ICD-10-CM | POA: Insufficient documentation

## 2019-08-19 DIAGNOSIS — K317 Polyp of stomach and duodenum: Secondary | ICD-10-CM | POA: Diagnosis not present

## 2019-08-19 DIAGNOSIS — E119 Type 2 diabetes mellitus without complications: Secondary | ICD-10-CM | POA: Diagnosis not present

## 2019-08-19 DIAGNOSIS — K449 Diaphragmatic hernia without obstruction or gangrene: Secondary | ICD-10-CM | POA: Insufficient documentation

## 2019-08-19 DIAGNOSIS — R1310 Dysphagia, unspecified: Secondary | ICD-10-CM

## 2019-08-19 DIAGNOSIS — E1140 Type 2 diabetes mellitus with diabetic neuropathy, unspecified: Secondary | ICD-10-CM | POA: Diagnosis not present

## 2019-08-19 DIAGNOSIS — K222 Esophageal obstruction: Secondary | ICD-10-CM | POA: Diagnosis not present

## 2019-08-19 DIAGNOSIS — K5730 Diverticulosis of large intestine without perforation or abscess without bleeding: Secondary | ICD-10-CM | POA: Diagnosis not present

## 2019-08-19 HISTORY — PX: ESOPHAGOGASTRODUODENOSCOPY (EGD) WITH PROPOFOL: SHX5813

## 2019-08-19 LAB — GLUCOSE, CAPILLARY: Glucose-Capillary: 108 mg/dL — ABNORMAL HIGH (ref 70–99)

## 2019-08-19 SURGERY — ESOPHAGOGASTRODUODENOSCOPY (EGD) WITH PROPOFOL
Anesthesia: General

## 2019-08-19 MED ORDER — PROPOFOL 10 MG/ML IV BOLUS
INTRAVENOUS | Status: DC | PRN
  Administered 2019-08-19 (×2): 50 mg via INTRAVENOUS

## 2019-08-19 MED ORDER — LIDOCAINE HCL (CARDIAC) PF 100 MG/5ML IV SOSY
PREFILLED_SYRINGE | INTRAVENOUS | Status: DC | PRN
  Administered 2019-08-19: 30 mg via INTRAVENOUS

## 2019-08-19 MED ORDER — PROPOFOL 10 MG/ML IV BOLUS
INTRAVENOUS | Status: AC
  Filled 2019-08-19: qty 20

## 2019-08-19 MED ORDER — SODIUM CHLORIDE 0.9 % IV SOLN: INTRAVENOUS | Status: DC

## 2019-08-19 MED ORDER — ONDANSETRON HCL 4 MG/2ML IJ SOLN: INTRAMUSCULAR | Status: DC | PRN

## 2019-08-19 MED ORDER — GLYCOPYRROLATE 0.2 MG/ML IJ SOLN: INTRAMUSCULAR | Status: DC | PRN

## 2019-08-19 NOTE — Op Note (Signed)
[facility] Gastroenterology Patient Name: Elizabeth Rivera Procedure Date: 08/19/2019 10:04 AM MRN: [id number] Account #: [id number] Date of Birth: 01/30/1951 Admit Type: Outpatient Age: 69 Room: [facility] ENDO ROOM 4 Gender: Female Note Status: Finalized Procedure:             Upper GI endoscopy Indications:           Dysphagia Providers:             Lucilla Lame MD, MD Referring MD:          Ocie Cornfield. Ouida Sills MD, MD (Referring MD) Medicines:             Propofol per Anesthesia Complications:         No immediate complications. Procedure:             Pre-Anesthesia Assessment:                        - Prior to the procedure, a History and Physical was                         performed, and patient medications and allergies were                         reviewed. The patient's tolerance of previous                         anesthesia was also reviewed. The risks and benefits                         of the procedure and the sedation options and risks                         were discussed with the patient. All questions were                         answered, and informed consent was obtained. Prior                         Anticoagulants: The patient has taken no previous                         anticoagulant or antiplatelet agents. ASA Grade                         Assessment: II - A patient with mild systemic disease.                         After reviewing the risks and benefits, the patient                         was deemed in satisfactory condition to undergo the                         procedure.                        After obtaining informed consent, the endoscope was  passed under direct vision. Throughout the procedure,                         the patient's blood pressure, pulse, and oxygen                         saturations were monitored continuously. The Endoscope                         was introduced through the mouth, and advanced to  the                         second part of duodenum. The upper GI endoscopy was                         accomplished without difficulty. The patient tolerated                         the procedure well. Findings:      A small hiatal hernia was present.      The examined esophagus mucosa was normal. A TTS dilator was passed       through the scope. Dilation with a 15-16.5-18 mm balloon dilator was       performed to 18 mm. The dilation site was examined following endoscope       reinsertion and showed no change. Two biopsies were obtained in the       middle third of the esophagus with cold forceps for histology.      The stomach was normal.      The examined duodenum was normal. Impression:            - Normal esophagus. Dilated.                        - Normal stomach.                        - Normal examined duodenum.                        - Two biopsies were obtained in the middle third of                         the esophagus. Recommendation:        - Discharge patient to home.                        - Resume previous diet.                        - Continue present medications.                        - Await pathology results. Procedure Code(s):     --- Professional ---                        [phone], Esophagogastroduodenoscopy, flexible,                         transoral; with transendoscopic balloon dilation of  esophagus (less than 30 mm diameter)                        43239, 59, Esophagogastroduodenoscopy, flexible,                         transoral; with biopsy, single or multiple Diagnosis Code(s):     --- Professional ---                        R13.10, Dysphagia, unspecified CPT copyright 2019 American Medical Association. All rights reserved. The codes documented in this report are preliminary and upon coder review may  be revised to meet current compliance requirements. Lucilla Lame MD, MD 08/19/2019 10:20:09 AM This report has been signed  electronically. Number of Addenda: 0 Note Initiated On: 08/19/2019 10:04 AM Estimated Blood Loss:  Estimated blood loss: none.      [facility]

## 2019-08-19 NOTE — Transfer of Care (Signed)
Immediate Anesthesia Transfer of Care Note  Patient: Elizabeth Rivera  Procedure(s) Performed: ESOPHAGOGASTRODUODENOSCOPY (EGD) WITH PROPOFOL (N/A )  Patient Location: PACU and Endoscopy Unit  Anesthesia Type:General  Level of Consciousness: sedated  Airway & Oxygen Therapy: Patient Spontanous Breathing  Post-op Assessment: Report given to RN  Post vital signs: stable  Last Vitals:  Vitals Value Taken Time  BP    Temp    Pulse    Resp    SpO2      Last Pain:  Vitals:   08/19/19 0944  TempSrc: Temporal  PainSc: 0-No pain         Complications: No complications documented.

## 2019-08-19 NOTE — Anesthesia Preprocedure Evaluation (Signed)
Anesthesia Evaluation  Patient identified by MRN, date of birth, ID band Patient awake    Reviewed: Allergy & Precautions, NPO status , Patient's Chart, lab work & pertinent test results  History of Anesthesia Complications Negative for: history of anesthetic complications  Airway Mallampati: II  TM Distance: >3 FB Neck ROM: Full    Dental no notable dental hx. (+) Teeth Intact   Pulmonary neg pulmonary ROS, neg sleep apnea, neg COPD,    breath sounds clear to auscultation- rhonchi (-) wheezing      Cardiovascular hypertension, Pt. on medications (-) CAD, (-) Past MI, (-) Cardiac Stents and (-) CABG  Rhythm:Regular Rate:Normal - Systolic murmurs and - Diastolic murmurs    Neuro/Psych PSYCHIATRIC DISORDERS Anxiety Depression  Neuromuscular disease CVA (L sided [place]), No Residual Symptoms    GI/Hepatic Neg liver ROS, GERD  ,  Endo/Other  diabetes, Insulin DependentHypothyroidism   Renal/GU Renal InsufficiencyRenal disease     Musculoskeletal  (+) Arthritis ,   Abdominal (+) - obese,   Peds  Hematology  (+) anemia ,   Anesthesia Other Findings Past Medical History: No date: Anemia 09/01/2013: Anemia of other chronic disease No date: Anxiety No date: Arthritis     Comment:  back s/p fracture x3 approx 1983 No date: Candidal vaginitis No date: Diabetes mellitus without complication ([place]) 28/31/5176: Diverticulosis of colon (without mention of hemorrhage)     Comment:  Internal hemms (Dr. Vira Agar) No date: Forearm fracture     Comment:  2013, left No date: GERD (gastroesophageal reflux disease)     Comment:  reflux HH 09/01/2002//egd reflux Esoph. HH Gastritis               nonbleeding erosive gastropathy Duodentitis 08/15/2006 No date: Hyperglycemia No date: Hyperlipidemia No date: Hypertension No date: Insomnia, unspecified No date: Irritable bowel syndrome     Comment:  history of 08/2002: Nervous  breakdown     Comment:  Hospital [facility] No date: Neuropathy No date: Other abnormal blood chemistry No date: Other chest pain     Comment:  chest discomfort No date: Other esophagitis     Comment:  erosive esophagitis 07/2007: Other specified disorders of adrenal glands     Comment:  adrenal mass via of CT 1.4cm left Adrenal no               change(Dr.Cope)  No date: Other specified gastritis without mention of hemorrhage     Comment:  erosvie gastritis 03/2014: Stroke ([place]) 03/2015: Stroke ([place]) No date: Thyroid disease     Comment:  hypothyroid, goiter No date: Wears dentures     Comment:  partial upper and lower.   Reproductive/Obstetrics                             Anesthesia Physical  Anesthesia Plan  ASA: III  Anesthesia Plan: General   Post-op Pain Management:    Induction: Intravenous  PONV Risk Score and Plan: 3 and Propofol infusion and TIVA  Airway Management Planned: Natural Airway and Nasal Cannula  Additional Equipment: None  Intra-op Plan:   Post-operative Plan:   Informed Consent: I have reviewed the patients History and Physical, chart, labs and discussed the procedure including the risks, benefits and alternatives for the proposed anesthesia with the patient or authorized representative who has indicated his/her understanding and acceptance.     Dental advisory given  Plan Discussed with: CRNA and Anesthesiologist  Anesthesia Plan Comments: (  Discussed risks of anesthesia with patient, including possibility of difficulty with spontaneous ventilation under anesthesia necessitating airway intervention, PONV, and rare risks such as cardiac or respiratory or neurological events. Patient understands.)        Anesthesia Quick Evaluation

## 2019-08-19 NOTE — Interval H&P Note (Signed)
History and Physical Interval Note:  08/19/2019 10:05 AM  Elizabeth Rivera  has presented today for surgery, with the diagnosis of Dysphagia R13.10.  The various methods of treatment have been discussed with the patient and family. After consideration of risks, benefits and other options for treatment, the patient has consented to  Procedure(s): ESOPHAGOGASTRODUODENOSCOPY (EGD) WITH PROPOFOL (N/A) as a surgical intervention.  The patient's history has been reviewed, patient examined, no change in status, stable for surgery.  I have reviewed the patient's chart and labs.  Questions were answered to the patient's satisfaction.     Oda Lansdowne [employer]

## 2019-08-19 NOTE — Interval H&P Note (Signed)
History and Physical Interval Note:  08/19/2019 10:02 AM  Elizabeth Rivera  has presented today for surgery, with the diagnosis of Dysphagia R13.10.  The various methods of treatment have been discussed with the patient and family. After consideration of risks, benefits and other options for treatment, the patient has consented to  Procedure(s): ESOPHAGOGASTRODUODENOSCOPY (EGD) WITH PROPOFOL (N/A) as a surgical intervention.  The patient's history has been reviewed, patient examined, no change in status, stable for surgery.  I have reviewed the patient's chart and labs.  Questions were answered to the patient's satisfaction.     Holden Draughon [employer]

## 2019-08-19 NOTE — Anesthesia Postprocedure Evaluation (Signed)
Anesthesia Post Note  Patient: Elizabeth Rivera  Procedure(s) Performed: ESOPHAGOGASTRODUODENOSCOPY (EGD) WITH PROPOFOL (N/A )  Patient location during evaluation: Endoscopy Anesthesia Type: General Level of consciousness: awake and alert Pain management: pain level controlled Vital Signs Assessment: post-procedure vital signs reviewed and stable Respiratory status: spontaneous breathing, nonlabored ventilation, respiratory function stable and patient connected to nasal cannula oxygen Cardiovascular status: blood pressure returned to baseline and stable Postop Assessment: no apparent nausea or vomiting Anesthetic complications: no   No complications documented.   Last Vitals:  Vitals:   08/19/19 1020 08/19/19 1030  BP: (!) 147/79 (!) 155/90  Pulse:  71  Resp: 20 11  Temp: (!) 35.8 C   SpO2: 100% 100%    Last Pain:  Vitals:   08/19/19 1020  TempSrc: Temporal  PainSc:                  Arita Miss

## 2019-08-20 ENCOUNTER — Inpatient Hospital Stay: Payer: Medicare HMO

## 2019-08-20 ENCOUNTER — Inpatient Hospital Stay: Payer: Medicare HMO | Attending: Internal Medicine | Admitting: Internal Medicine

## 2019-08-20 ENCOUNTER — Telehealth: Payer: Self-pay | Admitting: *Deleted

## 2019-08-20 ENCOUNTER — Encounter: Payer: Self-pay | Admitting: Gastroenterology

## 2019-08-20 ENCOUNTER — Ambulatory Visit
Admission: RE | Admit: 2019-08-20 | Discharge: 2019-08-20 | Disposition: A | Discharge status: Home / Self Care | Payer: Medicare HMO | Attending: Internal Medicine | Admitting: Internal Medicine

## 2019-08-20 ENCOUNTER — Ambulatory Visit
Admission: RE | Admit: 2019-08-20 | Discharge: 2019-08-20 | Disposition: A | Discharge status: Home / Self Care | Payer: Medicare HMO | Source: Ambulatory Visit | Attending: Internal Medicine | Admitting: Internal Medicine

## 2019-08-20 VITALS — BP 120/71 | HR 72 | Temp 96.9°F | Resp 18 | Wt 117.0 lb

## 2019-08-20 DIAGNOSIS — R0609 Other forms of dyspnea: Secondary | ICD-10-CM

## 2019-08-20 DIAGNOSIS — D649 Anemia, unspecified: Secondary | ICD-10-CM

## 2019-08-20 DIAGNOSIS — D631 Anemia in chronic kidney disease: Secondary | ICD-10-CM | POA: Insufficient documentation

## 2019-08-20 DIAGNOSIS — I129 Hypertensive chronic kidney disease with stage 1 through stage 4 chronic kidney disease, or unspecified chronic kidney disease: Secondary | ICD-10-CM | POA: Diagnosis not present

## 2019-08-20 DIAGNOSIS — N189 Chronic kidney disease, unspecified: Secondary | ICD-10-CM | POA: Diagnosis not present

## 2019-08-20 DIAGNOSIS — R0600 Dyspnea, unspecified: Secondary | ICD-10-CM

## 2019-08-20 LAB — RETICULOCYTES
Immature Retic Fract: 6.2 % (ref 2.3–15.9)
RBC.: 3.23 MIL/uL — ABNORMAL LOW (ref 3.87–5.11)
Retic Count, Absolute: 34.2 10*3/uL (ref 19.0–186.0)
Retic Ct Pct: 1.1 % (ref 0.4–3.1)

## 2019-08-20 LAB — C-REACTIVE PROTEIN: CRP: 0.7 mg/dL (ref <1.0)

## 2019-08-20 LAB — SAMPLE TO BLOOD BANK

## 2019-08-20 LAB — IRON AND TIBC
Iron: 36 ug/dL (ref 28–170)
Saturation Ratios: 11 % (ref 10.4–31.8)
TIBC: 315 ug/dL (ref 250–450)
UIBC: 279 ug/dL

## 2019-08-20 LAB — COMPREHENSIVE METABOLIC PANEL
ALT: 16 U/L (ref 0–44)
AST: 18 U/L (ref 15–41)
Albumin: 3.7 g/dL (ref 3.5–5.0)
Alkaline Phosphatase: 57 U/L (ref 38–126)
Anion gap: 9 (ref 5–15)
BUN: 39 mg/dL — ABNORMAL HIGH (ref 8–23)
CO2: 32 mmol/L (ref 22–32)
Calcium: 9.2 mg/dL (ref 8.9–10.3)
Chloride: 96 mmol/L — ABNORMAL LOW (ref 98–111)
Creatinine, Ser: 2.02 mg/dL — ABNORMAL HIGH (ref 0.44–1.00)
GFR calc Af Amer: 29 mL/min — ABNORMAL LOW (ref >60)
GFR calc non Af Amer: 25 mL/min — ABNORMAL LOW (ref >60)
Glucose, Bld: 84 mg/dL (ref 70–99)
Potassium: 3.5 mmol/L (ref 3.5–5.1)
Sodium: 137 mmol/L (ref 135–145)
Total Bilirubin: 0.5 mg/dL (ref 0.3–1.2)
Total Protein: 7.5 g/dL (ref 6.5–8.1)

## 2019-08-20 LAB — CBC WITH DIFFERENTIAL/PLATELET
Abs Immature Granulocytes: 0.02 10*3/uL (ref 0.00–0.07)
Basophils Absolute: 0 10*3/uL (ref 0.0–0.1)
Basophils Relative: 1 %
Eosinophils Absolute: 0.4 10*3/uL (ref 0.0–0.5)
Eosinophils Relative: 8 %
HCT: 26.6 % — ABNORMAL LOW (ref 36.0–46.0)
Hemoglobin: 8.4 g/dL — ABNORMAL LOW (ref 12.0–15.0)
Immature Granulocytes: 1 %
Lymphocytes Relative: 31 %
Lymphs Abs: 1.3 10*3/uL (ref 0.7–4.0)
MCH: 26.2 pg (ref 26.0–34.0)
MCHC: 31.6 g/dL (ref 30.0–36.0)
MCV: 82.9 fL (ref 80.0–100.0)
Monocytes Absolute: 0.3 10*3/uL (ref 0.1–1.0)
Monocytes Relative: 8 %
Neutro Abs: 2.2 10*3/uL (ref 1.7–7.7)
Neutrophils Relative %: 51 %
Platelets: 150 10*3/uL (ref 150–400)
RBC: 3.21 MIL/uL — ABNORMAL LOW (ref 3.87–5.11)
RDW: 14.4 % (ref 11.5–15.5)
WBC: 4.2 10*3/uL (ref 4.0–10.5)
nRBC: 0 % (ref 0.0–0.2)

## 2019-08-20 LAB — TECHNOLOGIST SMEAR REVIEW: Plt Morphology: NORMAL

## 2019-08-20 LAB — FERRITIN: Ferritin: 36 ng/mL (ref 11–307)

## 2019-08-20 LAB — FOLATE: Folate: 9.3 ng/mL (ref >5.9)

## 2019-08-20 LAB — SURGICAL PATHOLOGY

## 2019-08-20 LAB — VITAMIN B12: Vitamin B-12: 2235 pg/mL — ABNORMAL HIGH (ref 180–914)

## 2019-08-20 LAB — LACTATE DEHYDROGENASE: LDH: 139 U/L (ref 98–192)

## 2019-08-20 NOTE — Assessment & Plan Note (Addendum)
#  Anemia hemoglobin 8-9-chronic; likely secondary to chronic kidney disease.  Progressively getting worse.  Recommend further work-up-CBC CMP LDH B37 folic acid iron studies ferritin.  Recent myeloma work-up negative.   #Chronic kidney disease-stage III-IV-unclear etiology.  Given the worsening renal function check ultrasound kidneys.  #Dyspnea on exertion-likely secondary to fatigue from underlying anemia.  However check chest x-ray to doubt any acute process.  Thank you Dr. Ouida Sills for allowing me to participate in the care of your pleasant patient. Please do not hesitate to contact me with questions or concerns in the interim.  # DISPOSITION:will call with results # labs today # CXR today # Follow up- TBD- Dr.B  Addendum: Chest x-ray negative for acute process;  kidney ultrasound did not show any obstruction-however showed medical renal disease.  Given hemoglobin 8.4; iron saturation 11%-I would recommend Venofer.  Also recommend patient follow-up with PCP regarding her renal function/work-up.

## 2019-08-20 NOTE — Telephone Encounter (Signed)
Patient called to report that she is not going to habve her cxr today something about a patch on he arm and she needs another one first than she will go have cr after she gets that

## 2019-08-20 NOTE — Progress Notes (Signed)
[facility] NOTE  Patient Care Team: Kirk Ruths, MD as PCP - General (Internal Medicine) Murrell Redden, MD (Unknown Physician Specialty)  CHIEF COMPLAINTS/PURPOSE OF CONSULTATION: Anemia   HEMATOLOGY HISTORY  # ANEMIA hb-8-9; WBC/platelets-N; EGD-August 19, 2019- EGD-WNL [Dr.Wohl]-; colonoscopy-small polyps-[Jan, 2020; Dr.Wohl] ;June 2021-  SPEP/UPEP-NEGATIVE   # CKD- Stage IIIB [GFR-32]; Stroke x 2 [last 2017- ? ? bil LE weakness; [place]]; Rheumatology [Dr.Patel, [place]; DM Luetta Nutting 2021- A1c-9.6]  HISTORY OF PRESENTING ILLNESS:  Elizabeth Rivera 69 y.o.  female has been referred to [place] for further evaluation/work-up for anemia.  Patient complains of worsening fatigue.  Also complains of continued weight loss of the last few months.  Poor appetite.  Blood in stools: None Change in bowel habits- None Blood in urine: None Difficulty swallowing: None; improved after EGD Abnormal weight loss: 15-20 pounds in 6 months Iron supplementation: Iron pills x 5 years.  Prior Blood transfusions: None Vaginal bleeding: None   Review of Systems  Constitutional: Positive for malaise/fatigue and weight loss. Negative for chills, diaphoresis and fever.  HENT: Negative for nosebleeds and sore throat.   Eyes: Negative for double vision.  Respiratory: Positive for shortness of breath. Negative for cough, hemoptysis, sputum production and wheezing.   Cardiovascular: Negative for chest pain, palpitations, orthopnea and leg swelling.  Gastrointestinal: Positive for constipation. Negative for abdominal pain, blood in stool, diarrhea, heartburn, melena, nausea and vomiting.  Genitourinary: Negative for dysuria, frequency and urgency.  Musculoskeletal: Positive for back pain and joint pain.  Skin: Negative.  Negative for itching and rash.  Neurological: Negative for dizziness, tingling, focal weakness, weakness and headaches.  Endo/Heme/Allergies: Does not bruise/bleed easily.   Psychiatric/Behavioral: Negative for depression. The patient is not nervous/anxious and does not have insomnia.     MEDICAL HISTORY:  Past Medical History:  Diagnosis Date   Anemia    Anemia of other chronic disease 09/01/2013   Anxiety    Arthritis    back s/p fracture x3 approx 1983   Candidal vaginitis    Diabetes mellitus without complication ([place])    Diverticulosis of colon (without mention of hemorrhage) 05/20/2009   Internal hemms (Dr. Vira Agar)   Forearm fracture    2013, left   GERD (gastroesophageal reflux disease)    reflux HH 09/01/2002//egd reflux Esoph. HH Gastritis nonbleeding erosive gastropathy Duodentitis 08/15/2006   Hyperglycemia    Hyperlipidemia    Hypertension    Insomnia, unspecified    Irritable bowel syndrome    history of   Nervous breakdown 08/2002   [facility]   Neuropathy    Other abnormal blood chemistry    Other chest pain    chest discomfort   Other esophagitis    erosive esophagitis   Other specified disorders of adrenal glands 07/2007   adrenal mass via of CT 1.4cm left Adrenal no change(Dr.Cope)    Other specified gastritis without mention of hemorrhage    erosvie gastritis   Stroke ([place]) 03/2014   Stroke ([place]) 03/2015   Thyroid disease    hypothyroid, goiter   Wears dentures    partial upper and lower.    SURGICAL HISTORY: Past Surgical History:  Procedure Laterality Date   ABDOMINAL HYSTERECTOMY  1995   Part Hyst BSO DUB ovarian cyst B9   BREAST CYST ASPIRATION Bilateral    BREAST REDUCTION SURGERY  1984   CHOLECYSTECTOMY  03/2004   COLONOSCOPY  1. 08/15/06  2. 05/20/09   Int Hemms  2. Divertics Int  Hemms (Dr. Vira Agar)   COLONOSCOPY WITH PROPOFOL N/A 03/05/2018   Procedure: COLONOSCOPY WITH PROPOFOL;  Surgeon: Lucilla Lame, MD;  Location: [facility] ENDOSCOPY;  Service: Endoscopy;  Laterality: N/A;   CT ABD W & PELVIS WO CM  6/09   CT Scan abd stable adrenal adenoma 1.4cm left adrenal no change (Dr.  Jacqlyn Larsen)   CYSTOSCOPY  01/08/2008   Former site of inflammation healed (Dr. Jacqlyn Larsen)   ESOPHAGOGASTRODUODENOSCOPY  1. 09/01/02  2. 08/15/06   1. Reflux, HH  2. Reflux esoph HH gaastritis nonbleeding erosive gastropathy duodenitis   ESOPHAGOGASTRODUODENOSCOPY (EGD) WITH PROPOFOL N/A 06/01/2016   Procedure: ESOPHAGOGASTRODUODENOSCOPY (EGD) WITH PROPOFOL;  Surgeon: Lucilla Lame, MD;  Location: [facility];  Service: Endoscopy;  Laterality: N/A;  diabetic - insulin and oral meds   ESOPHAGOGASTRODUODENOSCOPY (EGD) WITH PROPOFOL N/A 08/19/2019   Procedure: ESOPHAGOGASTRODUODENOSCOPY (EGD) WITH PROPOFOL;  Surgeon: Lucilla Lame, MD;  Location: [facility] ENDOSCOPY;  Service: Endoscopy;  Laterality: N/A;   NSVD x1  1977   REDUCTION MAMMAPLASTY  1985   VAGINAL DELIVERY  1977    SOCIAL HISTORY: Social History   Socioeconomic History   Marital status: Married    Spouse name: Not on file   Number of children: Not on file   Years of education: Not on file   Highest education level: Not on file  Occupational History   Not on file  Tobacco Use   Smoking status: Never Smoker   Smokeless tobacco: Never Used  Vaping Use   Vaping Use: Never used  Substance and Sexual Activity   Alcohol use: No    Alcohol/week: 0.0 standard drinks   Drug use: No   Sexual activity: Never  Other Topics Concern   Not on file  Social History Narrative   No regular exercise.   Worked at [employer] in [place]   Her elderly mother had a CVA and moved in with patient   Social Determinants of Radio broadcast assistant Strain:    Difficulty of Paying Living Expenses:   Food Insecurity:    Worried About Charity fundraiser in the Last Year:    Arboriculturist in the Last Year:   Transportation Needs:    Film/video editor (Medical):    Lack of Transportation (Non-Medical):   Physical Activity:    Days of Exercise per Week:    Minutes of Exercise per Session:   Stress:    Feeling of  Stress :   Social Connections:    Frequency of Communication with Friends and Family:    Frequency of Social Gatherings with Friends and Family:    Attends Religious Services:    Active Member of Clubs or Organizations:    Attends Music therapist:    Marital Status:   Intimate Partner Violence:    Fear of Current or Ex-Partner:    Emotionally Abused:    Physically Abused:    Sexually Abused:     FAMILY HISTORY: Family History  Problem Relation Age of Onset   Hypertension Mother    Hyperlipidemia Mother    Stroke Mother        2015   Vision loss Father        vision problems   Cancer Father        Colon    Hyperlipidemia Sister    Hypertension Sister    Vision loss Sister    Breast cancer Maternal Grandmother 68   Breast cancer Cousin  paternal cousin   Prostate cancer Paternal Grandfather    Bladder Cancer Neg Hx    Kidney cancer Neg Hx     ALLERGIES:  is allergic to sulfa antibiotics.  MEDICATIONS:  Current Outpatient Medications  Medication Sig Dispense Refill   ALPRAZolam (XANAX) 0.25 MG tablet Take 1 tablet (0.25 mg total) by mouth 2 (two) times daily. 30 tablet 0   Blood Glucose Calibration (LIBERTY GLUCOSE CONTROL) Normal LIQD Use 1 each as directed     carvedilol (COREG) 12.5 MG tablet carvedilol 12.5 mg tablet     cholecalciferol (VITAMIN D) 1000 units tablet Take 1,000 Units by mouth daily.     clopidogrel (PLAVIX) 75 MG tablet Take 1 tablet (75 mg total) by mouth daily. 90 tablet 1   Continuous Blood Gluc Receiver (FREESTYLE LIBRE 2 READER SYSTM) DEVI Use 1 Device as directed     Continuous Blood Gluc Sensor (FREESTYLE LIBRE 2 SENSOR SYSTM) MISC Use 1 kit every 14 (fourteen) days for glucose monitoring     CRANBERRY PO Take by mouth daily.     DROPLET PEN NEEDLES 31G X 5 MM MISC      ferrous sulfate ([employer] IRON) 325 (65 FE) MG tablet Take 1 tablet (325 mg total) by mouth daily with breakfast.      furosemide (LASIX) 20 MG tablet Take by mouth.     gabapentin (NEURONTIN) 100 MG capsule Take 200 mg by mouth 3 (three) times daily.      glimepiride (AMARYL) 4 MG tablet glimepiride 4 mg tablet     glucose blood (PRECISION QID TEST) test strip 1 each (1 strip total) by XX route 3 (three) times daily Use as instructed.     hydrochlorothiazide (HYDRODIURIL) 25 MG tablet Take 1 tablet (25 mg total) by mouth daily. 90 tablet 1   insulin aspart (NOVOLOG) 100 UNIT/ML injection Inject 5 Units into the skin 3 (three) times daily before meals.     insulin glargine (LANTUS) 100 UNIT/ML injection Inject 9 Units into the skin at bedtime.     levothyroxine (SYNTHROID) 125 MCG tablet Take 125 mcg by mouth daily before breakfast.     Melatonin 5 MG TABS Take 5 mg by mouth at bedtime.     metFORMIN (GLUCOPHAGE) 500 MG tablet Twice a day     mirabegron ER (MYRBETRIQ) 25 MG TB24 tablet Take 1 tablet (25 mg total) by mouth daily. 30 tablet 11   mirtazapine (REMERON) 7.5 MG tablet      pantoprazole (PROTONIX) 40 MG tablet Take 1 tablet (40 mg total) by mouth 2 (two) times daily. 180 tablet 1   tiZANidine (ZANAFLEX) 2 MG tablet      venlafaxine XR (EFFEXOR-XR) 150 MG 24 hr capsule Take 1 capsule (150 mg total) by mouth 2 (two) times daily. 180 capsule 3   vitamin B-12 (CYANOCOBALAMIN) 1000 MCG tablet Take 1,000 mcg by mouth daily.      atorvastatin (LIPITOR) 40 MG tablet Take 1 tablet (40 mg total) by mouth daily at 6 PM. (Patient not taking: Reported on 08/20/2019) 90 tablet 3   bisacodyl (DULCOLAX) 10 MG suppository Place 10 mg rectally as needed for moderate constipation. (Patient not taking: Reported on 08/20/2019)     Cysteamine Bitartrate (PROCYSBI) 300 MG PACK Use 1 each 3 (three) times daily Use as instructed. (Patient not taking: Reported on 08/20/2019)     famotidine (PEPCID) 20 MG tablet TAKE 1 TABLET EVERY DAY (Patient not taking: Reported on 08/20/2019) 90 tablet 1  FERREX 150 150 MG  capsule  (Patient not taking: Reported on 08/20/2019)     metoprolol succinate (TOPROL-XL) 100 MG 24 hr tablet Take 1 tablet (100 mg total) by mouth daily. Take with or immediately following a meal. (Patient not taking: Reported on 08/20/2019) 90 tablet 3   Na Sulfate-K Sulfate-Mg Sulf (SUPREP BOWEL PREP KIT) 17.5-3.13-1.6 GM/177ML SOLN Take 1 kit by mouth as directed. (Patient not taking: Reported on 08/20/2019) 1 Bottle 0   No current facility-administered medications for this visit.      PHYSICAL EXAMINATION:   Vitals:   08/20/19 1140  BP: 120/71  Pulse: 72  Resp: 18  Temp: (!) 96.9 F (36.1 C)   Filed Weights   08/20/19 1140  Weight: 117 lb (53.1 kg)    Physical Exam Constitutional:      Comments: Frail-appearing Caucasian female patient.  In a wheelchair.  Accompanied by family.  HENT:     Head: Normocephalic and atraumatic.     Mouth/Throat:     Pharynx: No oropharyngeal exudate.  Eyes:     Pupils: Pupils are equal, round, and reactive to light.  Cardiovascular:     Rate and Rhythm: Normal rate and regular rhythm.  Pulmonary:     Effort: No respiratory distress.     Breath sounds: No wheezing.  Abdominal:     General: Bowel sounds are normal. There is no distension.     Palpations: Abdomen is soft. There is no mass.     Tenderness: There is no abdominal tenderness. There is no guarding or rebound.  Musculoskeletal:        General: No tenderness. Normal range of motion.     Cervical back: Normal range of motion and neck supple.  Skin:    General: Skin is warm.  Neurological:     Mental Status: She is alert and oriented to person, place, and time.     Comments: Chronic right sided weakness/previous stroke  Psychiatric:        Mood and Affect: Affect normal.     LABORATORY DATA:  I have reviewed the data as listed Lab Results  Component Value Date   WBC 4.2 08/20/2019   HGB 8.4 (L) 08/20/2019   HCT 26.6 (L) 08/20/2019   MCV 82.9 08/20/2019   PLT 150  08/20/2019   Recent Labs    08/14/19 1401 08/20/19 1300  NA 130* 137  K 4.5 3.5  CL 94* 96*  CO2 26 32  GLUCOSE 197* 84  BUN 40* 39*  CREATININE 1.45* 2.02*  CALCIUM 9.4 9.2  GFRNONAA 37* 25*  GFRAA 43* 29*  PROT  --  7.5  ALBUMIN  --  3.7  AST  --  18  ALT  --  16  ALKPHOS  --  57  BILITOT  --  0.5     DG Chest 2 View  Result Date: 08/21/2019 CLINICAL DATA:  Dyspnea.  Anemia.  Hypertension.  Diabetes. EXAM: CHEST - 2 VIEW COMPARISON:  01/03/2018 FINDINGS: Mild hyperinflation. Midline trachea. Normal heart size. Atherosclerosis in the transverse aorta. Tortuous thoracic aorta. No pleural effusion or pneumothorax. Clear lungs. IMPRESSION: No acute cardiopulmonary disease. Aortic Atherosclerosis (ICD10-I70.0). Electronically Signed   By: Abigail Miyamoto M.D.   On: 08/21/2019 17:34   US RENAL  Result Date: 08/30/2019 CLINICAL DATA:  Acute on chronic renal failure, history diabetes mellitus, hypertension, stroke EXAM: RENAL / URINARY TRACT ULTRASOUND COMPLETE COMPARISON:  03/25/2019 FINDINGS: Right Kidney: Renal measurements: 9.2 x 5.2 x 5.0  cm = volume: 124 mL. Normal cortical thickness. Increased cortical echogenicity. Small cyst at lower pole 2.1 x 2.0 x 2.1 cm. Minimally prominent extrarenal pelvis. No mass, hydronephrosis or shadowing calcification. Left Kidney: Renal measurements: 8.6 x 4.9 x 5.1 cm = volume: 112 mL. Cortical thinning. Increased cortical echogenicity. Tiny cyst at lower pole LEFT kidney 7 x 6 x 7 mm. No additional mass, hydronephrosis or shadowing calcification. Bladder: Underdistended. Bladder wall appears slightly prominent, at least in part related to underdistention. Both ureteral jets are visualized. Other: N/A IMPRESSION: Cortical thinning and medical renal disease changes of both kidneys. Small BILATERAL renal cysts. No evidence of solid renal mass or hydronephrosis. Electronically Signed   By: Lavonia Dana M.D.   On: 08/30/2019 20:20   US Venous Img Lower  Unilateral Left (DVT)  Result Date: 08/14/2019 CLINICAL DATA:  69 year old female with pain and swelling in the ankles EXAM: LEFT LOWER EXTREMITY VENOUS DOPPLER ULTRASOUND TECHNIQUE: Gray-scale sonography with graded compression, as well as color Doppler and duplex ultrasound were performed to evaluate the lower extremity deep venous systems from the level of the common femoral vein and including the common femoral, femoral, profunda femoral, popliteal and calf veins including the posterior tibial, peroneal and gastrocnemius veins when visible. The superficial great saphenous vein was also interrogated. Spectral Doppler was utilized to evaluate flow at rest and with distal augmentation maneuvers in the common femoral, femoral and popliteal veins. COMPARISON:  None. FINDINGS: Contralateral Common Femoral Vein: Respiratory phasicity is normal and symmetric with the symptomatic side. No evidence of thrombus. Normal compressibility. Common Femoral Vein: No evidence of thrombus. Normal compressibility, respiratory phasicity and response to augmentation. Saphenofemoral Junction: No evidence of thrombus. Normal compressibility and flow on color Doppler imaging. Profunda Femoral Vein: No evidence of thrombus. Normal compressibility and flow on color Doppler imaging. Femoral Vein: No evidence of thrombus. Normal compressibility, respiratory phasicity and response to augmentation. Popliteal Vein: No evidence of thrombus. Normal compressibility, respiratory phasicity and response to augmentation. Calf Veins: No evidence of thrombus. Normal compressibility and flow on color Doppler imaging. Superficial Great Saphenous Vein: No evidence of thrombus. Normal compressibility and flow on color Doppler imaging. Other Findings:  None. IMPRESSION: Sonographic survey of the left lower extremity negative for DVT Electronically Signed   By: Corrie Mckusick D.O.   On: 08/14/2019 16:31    Normocytic anemia #Anemia hemoglobin 8-9-chronic;  likely secondary to chronic kidney disease.  Progressively getting worse.  Recommend further work-up-CBC CMP LDH V01 folic acid iron studies ferritin.  Recent myeloma work-up negative.   #Chronic kidney disease-stage III-IV-unclear etiology.  Given the worsening renal function check ultrasound kidneys.  #Dyspnea on exertion-likely secondary to fatigue from underlying anemia.  However check chest x-ray to doubt any acute process.  Thank you Dr. Ouida Sills for allowing me to participate in the care of your pleasant patient. Please do not hesitate to contact me with questions or concerns in the interim.  # DISPOSITION:will call with results # labs today # CXR today # Follow up- TBD- Dr.B  Addendum: Chest x-ray negative for acute process;  kidney ultrasound did not show any obstruction-however showed medical renal disease.  Given hemoglobin 8.4; iron saturation 11%-I would recommend Venofer.  Also recommend patient follow-up with PCP regarding her renal function/work-up.  All questions were answered. The patient knows to call the clinic with any problems, questions or concerns.   Cammie Sickle, MD 09/08/2019 8:29 AM

## 2019-08-20 NOTE — Telephone Encounter (Signed)
Returned patient's phone call. She is waiting on a receiving the diabetic glucometer patch first before getting the xray. This is being mailed on her. She wanted Dr. B to be aware of her rationale of delaying on the cxr. She wants to manage her "diabetes first."

## 2019-08-21 ENCOUNTER — Other Ambulatory Visit: Payer: Self-pay

## 2019-08-21 ENCOUNTER — Ambulatory Visit
Admission: RE | Admit: 2019-08-21 | Discharge: 2019-08-21 | Disposition: A | Discharge status: Home / Self Care | Payer: Medicare HMO | Source: Ambulatory Visit | Attending: Internal Medicine | Admitting: Internal Medicine

## 2019-08-21 ENCOUNTER — Ambulatory Visit
Admission: RE | Admit: 2019-08-21 | Discharge: 2019-08-21 | Disposition: A | Discharge status: Home / Self Care | Payer: Medicare HMO | Attending: Internal Medicine | Admitting: Internal Medicine

## 2019-08-21 DIAGNOSIS — D649 Anemia, unspecified: Secondary | ICD-10-CM | POA: Diagnosis not present

## 2019-08-21 DIAGNOSIS — R0600 Dyspnea, unspecified: Secondary | ICD-10-CM | POA: Diagnosis not present

## 2019-08-21 DIAGNOSIS — I10 Essential (primary) hypertension: Secondary | ICD-10-CM | POA: Diagnosis not present

## 2019-08-21 LAB — HAPTOGLOBIN: Haptoglobin: 103 mg/dL (ref 37–355)

## 2019-08-22 ENCOUNTER — Encounter: Payer: Self-pay | Admitting: Internal Medicine

## 2019-08-22 DIAGNOSIS — D649 Anemia, unspecified: Secondary | ICD-10-CM

## 2019-08-22 DIAGNOSIS — N179 Acute kidney failure, unspecified: Secondary | ICD-10-CM

## 2019-08-22 NOTE — Telephone Encounter (Signed)
On 7/16- I called pt to dscuss the results of her labs/work up. UNABLE to reach pt's cell/I left VM for pt on her home phone.   I would recommend US kidneys for further work  C- please schedule US kidneys ASAP;

## 2019-08-22 NOTE — Telephone Encounter (Signed)
My chart msg sent to patient as well that MD was attempting to reach her.

## 2019-08-25 ENCOUNTER — Telehealth: Payer: Self-pay | Admitting: *Deleted

## 2019-08-25 ENCOUNTER — Encounter: Payer: Self-pay | Admitting: Gastroenterology

## 2019-08-25 NOTE — Telephone Encounter (Signed)
Patient called requesting that Dr B order the [place] of Kidney he wanted Dr Ouida Sills to order at [facility] stating that Dr Ouida Sills told her they do not do those at [facility]

## 2019-08-25 NOTE — Telephone Encounter (Signed)
Per Dr. Sharmaine Base note on 7/16, orders were already placed for ultrasound kidney.  [place], please schedule ultrasound.

## 2019-08-26 NOTE — Telephone Encounter (Signed)
Ultrasound scheduled on Friday 7/23

## 2019-08-28 DIAGNOSIS — E039 Hypothyroidism, unspecified: Secondary | ICD-10-CM | POA: Diagnosis not present

## 2019-08-28 DIAGNOSIS — E1169 Type 2 diabetes mellitus with other specified complication: Secondary | ICD-10-CM | POA: Diagnosis not present

## 2019-08-28 DIAGNOSIS — Z794 Long term (current) use of insulin: Secondary | ICD-10-CM | POA: Diagnosis not present

## 2019-08-28 DIAGNOSIS — Z0000 Encounter for general adult medical examination without abnormal findings: Secondary | ICD-10-CM | POA: Diagnosis not present

## 2019-08-28 DIAGNOSIS — I10 Essential (primary) hypertension: Secondary | ICD-10-CM | POA: Diagnosis not present

## 2019-08-28 DIAGNOSIS — E785 Hyperlipidemia, unspecified: Secondary | ICD-10-CM | POA: Diagnosis not present

## 2019-08-28 DIAGNOSIS — F325 Major depressive disorder, single episode, in full remission: Secondary | ICD-10-CM | POA: Diagnosis not present

## 2019-08-28 DIAGNOSIS — E1142 Type 2 diabetes mellitus with diabetic polyneuropathy: Secondary | ICD-10-CM | POA: Diagnosis not present

## 2019-08-28 DIAGNOSIS — E1165 Type 2 diabetes mellitus with hyperglycemia: Secondary | ICD-10-CM | POA: Diagnosis not present

## 2019-08-29 ENCOUNTER — Ambulatory Visit
Admission: RE | Admit: 2019-08-29 | Discharge: 2019-08-29 | Disposition: A | Discharge status: Home / Self Care | Payer: Medicare HMO | Source: Ambulatory Visit | Attending: Internal Medicine | Admitting: Internal Medicine

## 2019-08-29 ENCOUNTER — Other Ambulatory Visit: Payer: Self-pay

## 2019-08-29 DIAGNOSIS — N179 Acute kidney failure, unspecified: Secondary | ICD-10-CM | POA: Insufficient documentation

## 2019-08-29 DIAGNOSIS — Q600 Renal agenesis, unilateral: Secondary | ICD-10-CM | POA: Diagnosis not present

## 2019-08-29 DIAGNOSIS — N183 Chronic kidney disease, stage 3 unspecified: Secondary | ICD-10-CM | POA: Insufficient documentation

## 2019-08-29 DIAGNOSIS — N281 Cyst of kidney, acquired: Secondary | ICD-10-CM | POA: Diagnosis not present

## 2019-08-29 DIAGNOSIS — N3289 Other specified disorders of bladder: Secondary | ICD-10-CM | POA: Diagnosis not present

## 2019-09-01 ENCOUNTER — Encounter: Payer: Self-pay | Admitting: Internal Medicine

## 2019-09-02 ENCOUNTER — Other Ambulatory Visit: Payer: Self-pay | Admitting: Internal Medicine

## 2019-09-02 ENCOUNTER — Telehealth: Payer: Self-pay | Admitting: Internal Medicine

## 2019-09-02 DIAGNOSIS — D649 Anemia, unspecified: Secondary | ICD-10-CM

## 2019-09-02 DIAGNOSIS — D631 Anemia in chronic kidney disease: Secondary | ICD-10-CM | POA: Insufficient documentation

## 2019-09-02 NOTE — Telephone Encounter (Signed)
Spoke to patient regarding the results of the ultrasound kidney-negative for hydronephrosis or any acute process.  Renal insufficiency secondary to chronic kidney disease.  Recommend evaluation with PCP/Dr. Anderson/referral to nephrology.  Recommend proceeding with IV iron infusion Venofer-hemoglobin 8.4 ferritin 36 saturation 11%.  # C-follow-up- early next week- MD; labs- cbc/bmp;Hold tube, Venofer. Dr.B

## 2019-09-03 NOTE — Addendum Note (Signed)
Addended by: Gloris Ham on: 09/03/2019 08:34 AM   Modules accepted: Orders

## 2019-09-08 ENCOUNTER — Inpatient Hospital Stay (HOSPITAL_BASED_OUTPATIENT_CLINIC_OR_DEPARTMENT_OTHER): Payer: Medicare HMO | Admitting: Internal Medicine

## 2019-09-08 ENCOUNTER — Inpatient Hospital Stay: Payer: Medicare HMO

## 2019-09-08 ENCOUNTER — Inpatient Hospital Stay: Payer: Medicare HMO | Attending: Internal Medicine

## 2019-09-08 ENCOUNTER — Other Ambulatory Visit: Payer: Self-pay

## 2019-09-08 ENCOUNTER — Encounter: Payer: Self-pay | Admitting: Internal Medicine

## 2019-09-08 VITALS — BP 149/79 | HR 83 | Temp 97.2°F | Resp 16 | Ht 62.0 in | Wt 114.0 lb

## 2019-09-08 VITALS — BP 153/66 | HR 85 | Resp 16

## 2019-09-08 DIAGNOSIS — R5383 Other fatigue: Secondary | ICD-10-CM | POA: Diagnosis not present

## 2019-09-08 DIAGNOSIS — Z79899 Other long term (current) drug therapy: Secondary | ICD-10-CM | POA: Diagnosis not present

## 2019-09-08 DIAGNOSIS — I129 Hypertensive chronic kidney disease with stage 1 through stage 4 chronic kidney disease, or unspecified chronic kidney disease: Secondary | ICD-10-CM | POA: Insufficient documentation

## 2019-09-08 DIAGNOSIS — R0609 Other forms of dyspnea: Secondary | ICD-10-CM | POA: Diagnosis not present

## 2019-09-08 DIAGNOSIS — D631 Anemia in chronic kidney disease: Secondary | ICD-10-CM

## 2019-09-08 DIAGNOSIS — D649 Anemia, unspecified: Secondary | ICD-10-CM

## 2019-09-08 DIAGNOSIS — E1122 Type 2 diabetes mellitus with diabetic chronic kidney disease: Secondary | ICD-10-CM | POA: Diagnosis not present

## 2019-09-08 DIAGNOSIS — R634 Abnormal weight loss: Secondary | ICD-10-CM

## 2019-09-08 DIAGNOSIS — N183 Chronic kidney disease, stage 3 unspecified: Secondary | ICD-10-CM | POA: Diagnosis not present

## 2019-09-08 LAB — SAMPLE TO BLOOD BANK

## 2019-09-08 LAB — BASIC METABOLIC PANEL
Anion gap: 11 (ref 5–15)
BUN: 35 mg/dL — ABNORMAL HIGH (ref 8–23)
CO2: 29 mmol/L (ref 22–32)
Calcium: 9.4 mg/dL (ref 8.9–10.3)
Chloride: 98 mmol/L (ref 98–111)
Creatinine, Ser: 1.79 mg/dL — ABNORMAL HIGH (ref 0.44–1.00)
GFR calc Af Amer: 33 mL/min — ABNORMAL LOW (ref >60)
GFR calc non Af Amer: 29 mL/min — ABNORMAL LOW (ref >60)
Glucose, Bld: 221 mg/dL — ABNORMAL HIGH (ref 70–99)
Potassium: 3.4 mmol/L — ABNORMAL LOW (ref 3.5–5.1)
Sodium: 138 mmol/L (ref 135–145)

## 2019-09-08 LAB — CBC WITH DIFFERENTIAL/PLATELET
Abs Immature Granulocytes: 0.03 10*3/uL (ref 0.00–0.07)
Basophils Absolute: 0.1 10*3/uL (ref 0.0–0.1)
Basophils Relative: 1 %
Eosinophils Absolute: 0.7 10*3/uL — ABNORMAL HIGH (ref 0.0–0.5)
Eosinophils Relative: 12 %
HCT: 26.7 % — ABNORMAL LOW (ref 36.0–46.0)
Hemoglobin: 8.4 g/dL — ABNORMAL LOW (ref 12.0–15.0)
Immature Granulocytes: 1 %
Lymphocytes Relative: 22 %
Lymphs Abs: 1.3 10*3/uL (ref 0.7–4.0)
MCH: 26.1 pg (ref 26.0–34.0)
MCHC: 31.5 g/dL (ref 30.0–36.0)
MCV: 82.9 fL (ref 80.0–100.0)
Monocytes Absolute: 0.4 10*3/uL (ref 0.1–1.0)
Monocytes Relative: 7 %
Neutro Abs: 3.4 10*3/uL (ref 1.7–7.7)
Neutrophils Relative %: 57 %
Platelets: 197 10*3/uL (ref 150–400)
RBC: 3.22 MIL/uL — ABNORMAL LOW (ref 3.87–5.11)
RDW: 14 % (ref 11.5–15.5)
WBC: 5.9 10*3/uL (ref 4.0–10.5)
nRBC: 0 % (ref 0.0–0.2)

## 2019-09-08 MED ORDER — IRON SUCROSE 20 MG/ML IV SOLN
200.0000 mg | Freq: Once | INTRAVENOUS | Status: AC
  Administered 2019-09-08: 200 mg via INTRAVENOUS
  Filled 2019-09-08: qty 10

## 2019-09-08 MED ORDER — ALBUTEROL SULFATE HFA 108 (90 BASE) MCG/ACT IN AERS
2.0000 | INHALATION_SPRAY | Freq: Four times a day (QID) | RESPIRATORY_TRACT | 0 refills | Status: AC | PRN
Start: 2019-09-08 — End: ?

## 2019-09-08 MED ORDER — SODIUM CHLORIDE 0.9 % IV SOLN: 200.0000 mg | Freq: Once | INTRAVENOUS | Status: DC

## 2019-09-08 MED ORDER — SODIUM CHLORIDE 0.9 % IV SOLN
Freq: Once | INTRAVENOUS | Status: AC
  Filled 2019-09-08: qty 250

## 2019-09-08 NOTE — Progress Notes (Signed)
[facility] NOTE  Patient Care Team: Kirk Ruths, MD as PCP - General (Internal Medicine) Murrell Redden, MD (Unknown Physician Specialty)  CHIEF COMPLAINTS/PURPOSE OF CONSULTATION: Anemia   HEMATOLOGY HISTORY  # ANEMIA hb-8-9; WBC/platelets-N; EGD-August 19, 2019- EGD-WNL [Dr.Wohl]-; colonoscopy-small polyps-[Jan, 2020; Dr.Wohl] ;June 2021-  SPEP/UPEP-NEGATIVE   # CKD- Stage IIIB [GFR-32]; Stroke x 2 [last 2017- ? ? bil LE weakness; [place]]; Rheumatology [Dr.Patel, [place]]; DM [March 2021- A1c-9.6]  HISTORY OF PRESENTING ILLNESS:  Elizabeth Rivera 69 y.o.  female worsening anemia for the la many months is here for follow-up.  Patient complains of worsening fatigue.  She has lost weight.  Poor appetite.   Review of Systems  Constitutional: Positive for malaise/fatigue and weight loss. Negative for chills, diaphoresis and fever.  HENT: Negative for nosebleeds and sore throat.   Eyes: Negative for double vision.  Respiratory: Positive for shortness of breath. Negative for cough, hemoptysis, sputum production and wheezing.   Cardiovascular: Negative for chest pain, palpitations, orthopnea and leg swelling.  Gastrointestinal: Positive for constipation. Negative for abdominal pain, blood in stool, diarrhea, heartburn, melena, nausea and vomiting.  Genitourinary: Negative for dysuria, frequency and urgency.  Musculoskeletal: Positive for back pain and joint pain.  Skin: Negative.  Negative for itching and rash.  Neurological: Negative for dizziness, tingling, focal weakness, weakness and headaches.  Endo/Heme/Allergies: Does not bruise/bleed easily.  Psychiatric/Behavioral: Negative for depression. The patient is not nervous/anxious and does not have insomnia.     MEDICAL HISTORY:  Past Medical History:  Diagnosis Date  . Anemia   . Anemia of other chronic disease 09/01/2013  . Anxiety   . Arthritis    back s/p fracture x3 approx 1983  . Candidal vaginitis    . Diabetes mellitus without complication ([place])   . Diverticulosis of colon (without mention of hemorrhage) 05/20/2009   Internal hemms (Dr. Vira Agar)  . Forearm fracture    2013, left  . GERD (gastroesophageal reflux disease)    reflux HH 09/01/2002//egd reflux Esoph. HH Gastritis nonbleeding erosive gastropathy Duodentitis 08/15/2006  . Hyperglycemia   . Hyperlipidemia   . Hypertension   . Insomnia, unspecified   . Irritable bowel syndrome    history of  . Nervous breakdown 08/2002   [facility]  . Neuropathy   . Other abnormal blood chemistry   . Other chest pain    chest discomfort  . Other esophagitis    erosive esophagitis  . Other specified disorders of adrenal glands 07/2007   adrenal mass via of CT 1.4cm left Adrenal no change(Dr.Cope)   . Other specified gastritis without mention of hemorrhage    erosvie gastritis  . Stroke ([place]) 03/2014  . Stroke ([place]) 03/2015  . Thyroid disease    hypothyroid, goiter  . Wears dentures    partial upper and lower.    SURGICAL HISTORY: Past Surgical History:  Procedure Laterality Date  . ABDOMINAL HYSTERECTOMY  1995   Part Hyst BSO DUB ovarian cyst B9  . BREAST CYST ASPIRATION Bilateral   . BREAST REDUCTION SURGERY  1984  . CHOLECYSTECTOMY  03/2004  . COLONOSCOPY  1. 08/15/06  2. 05/20/09   Int Hemms  2. Divertics Int Hemms (Dr. Vira Agar)  . COLONOSCOPY WITH PROPOFOL N/A 03/05/2018   Procedure: COLONOSCOPY WITH PROPOFOL;  Surgeon: Lucilla Lame, MD;  Location: [facility] ENDOSCOPY;  Service: Endoscopy;  Laterality: N/A;  . CT ABD W & PELVIS WO CM  6/09   CT Scan abd stable adrenal  adenoma 1.4cm left adrenal no change (Dr. Jacqlyn Larsen)  . CYSTOSCOPY  01/08/2008   Former site of inflammation healed (Dr. Jacqlyn Larsen)  . ESOPHAGOGASTRODUODENOSCOPY  1. 09/01/02  2. 08/15/06   1. Reflux, HH  2. Reflux esoph HH gaastritis nonbleeding erosive gastropathy duodenitis  . ESOPHAGOGASTRODUODENOSCOPY (EGD) WITH PROPOFOL N/A 06/01/2016   Procedure:  ESOPHAGOGASTRODUODENOSCOPY (EGD) WITH PROPOFOL;  Surgeon: Lucilla Lame, MD;  Location: [facility];  Service: Endoscopy;  Laterality: N/A;  diabetic - insulin and oral meds  . ESOPHAGOGASTRODUODENOSCOPY (EGD) WITH PROPOFOL N/A 08/19/2019   Procedure: ESOPHAGOGASTRODUODENOSCOPY (EGD) WITH PROPOFOL;  Surgeon: Lucilla Lame, MD;  Location: [facility] ENDOSCOPY;  Service: Endoscopy;  Laterality: N/A;  . NSVD x1  1977  . REDUCTION MAMMAPLASTY  1985  . VAGINAL DELIVERY  1977    SOCIAL HISTORY: Social History   Socioeconomic History  . Marital status: Married    Spouse name: Not on file  . Number of children: Not on file  . Years of education: Not on file  . Highest education level: Not on file  Occupational History  . Not on file  Tobacco Use  . Smoking status: Never Smoker  . Smokeless tobacco: Never Used  Vaping Use  . Vaping Use: Never used  Substance and Sexual Activity  . Alcohol use: No    Alcohol/week: 0.0 standard drinks  . Drug use: No  . Sexual activity: Never  Other Topics Concern  . Not on file  Social History Narrative   No regular exercise.   Worked at [employer] in [place]   Her elderly mother had a CVA and moved in with patient   Social Determinants of Health   Financial Resource Strain:   . Difficulty of Paying Living Expenses:   Food Insecurity:   . Worried About Charity fundraiser in the Last Year:   . Arboriculturist in the Last Year:   Transportation Needs:   . Film/video editor (Medical):   Marland Kitchen Lack of Transportation (Non-Medical):   Physical Activity:   . Days of Exercise per Week:   . Minutes of Exercise per Session:   Stress:   . Feeling of Stress :   Social Connections:   . Frequency of Communication with Friends and Family:   . Frequency of Social Gatherings with Friends and Family:   . Attends Religious Services:   . Active Member of Clubs or Organizations:   . Attends Archivist Meetings:   Marland Kitchen Marital Status:   Intimate  Partner Violence:   . Fear of Current or Ex-Partner:   . Emotionally Abused:   Marland Kitchen Physically Abused:   . Sexually Abused:     FAMILY HISTORY: Family History  Problem Relation Age of Onset  . Hypertension Mother   . Hyperlipidemia Mother   . Stroke Mother        50  . Vision loss Father        vision problems  . Cancer Father        Colon   . Hyperlipidemia Sister   . Hypertension Sister   . Vision loss Sister   . Breast cancer Maternal Grandmother 35  . Breast cancer Cousin        paternal cousin  . Prostate cancer Paternal Grandfather   . Bladder Cancer Neg Hx   . Kidney cancer Neg Hx     ALLERGIES:  is allergic to sulfa antibiotics.  MEDICATIONS:  Current Outpatient Medications  Medication Sig Dispense Refill  .  ALPRAZolam (XANAX) 0.25 MG tablet Take 1 tablet (0.25 mg total) by mouth 2 (two) times daily. 30 tablet 0  . atorvastatin (LIPITOR) 40 MG tablet Take 1 tablet (40 mg total) by mouth daily at 6 PM. 90 tablet 3  . bisacodyl (DULCOLAX) 10 MG suppository Place 10 mg rectally as needed for moderate constipation.     . Blood Glucose Calibration (LIBERTY GLUCOSE CONTROL) Normal LIQD Use 1 each as directed    . carvedilol (COREG) 12.5 MG tablet carvedilol 12.5 mg tablet    . cholecalciferol (VITAMIN D) 1000 units tablet Take 1,000 Units by mouth daily.    . clopidogrel (PLAVIX) 75 MG tablet Take 1 tablet (75 mg total) by mouth daily. 90 tablet 1  . Continuous Blood Gluc Receiver (FREESTYLE LIBRE 2 READER SYSTM) DEVI Use 1 Device as directed    . Continuous Blood Gluc Sensor (FREESTYLE LIBRE 2 SENSOR SYSTM) MISC Use 1 kit every 14 (fourteen) days for glucose monitoring    . CRANBERRY PO Take by mouth daily.    . DROPLET PEN NEEDLES 31G X 5 MM MISC     . famotidine (PEPCID) 20 MG tablet TAKE 1 TABLET EVERY DAY 90 tablet 1  . FERREX 150 150 MG capsule     . ferrous sulfate ([employer] IRON) 325 (65 FE) MG tablet Take 1 tablet (325 mg total) by mouth daily with breakfast.    .  furosemide (LASIX) 20 MG tablet Take by mouth.    . gabapentin (NEURONTIN) 100 MG capsule Take 200 mg by mouth 3 (three) times daily.     Marland Kitchen glimepiride (AMARYL) 4 MG tablet glimepiride 4 mg tablet    . glucose blood (PRECISION QID TEST) test strip 1 each (1 strip total) by XX route 3 (three) times daily Use as instructed.    . hydrochlorothiazide (HYDRODIURIL) 25 MG tablet Take 1 tablet (25 mg total) by mouth daily. 90 tablet 1  . insulin aspart (NOVOLOG) 100 UNIT/ML injection Inject 5 Units into the skin 3 (three) times daily before meals.    . insulin glargine (LANTUS) 100 UNIT/ML injection Inject 9 Units into the skin at bedtime.    Marland Kitchen levothyroxine (SYNTHROID) 125 MCG tablet Take 125 mcg by mouth daily before breakfast.    . Melatonin 5 MG TABS Take 5 mg by mouth at bedtime.    . metFORMIN (GLUCOPHAGE) 500 MG tablet Twice a day    . mirabegron ER (MYRBETRIQ) 25 MG TB24 tablet Take 1 tablet (25 mg total) by mouth daily. 30 tablet 11  . mirtazapine (REMERON) 7.5 MG tablet     . pantoprazole (PROTONIX) 40 MG tablet Take 1 tablet (40 mg total) by mouth 2 (two) times daily. 180 tablet 1  . venlafaxine XR (EFFEXOR-XR) 150 MG 24 hr capsule Take 1 capsule (150 mg total) by mouth 2 (two) times daily. 180 capsule 3  . albuterol (VENTOLIN HFA) 108 (90 Base) MCG/ACT inhaler Inhale 2 puffs into the lungs every 6 (six) hours as needed for wheezing or shortness of breath. 8 g 0  . metoprolol succinate (TOPROL-XL) 100 MG 24 hr tablet Take 1 tablet (100 mg total) by mouth daily. Take with or immediately following a meal. (Patient not taking: Reported on 08/20/2019) 90 tablet 3   No current facility-administered medications for this visit.      PHYSICAL EXAMINATION:   Vitals:   09/08/19 1035  BP: (!) 149/79  Pulse: 83  Resp: 16  Temp: (!) 97.2 F (36.2 C)  SpO2: 99%   Filed Weights   09/08/19 1035  Weight: 114 lb (51.7 kg)    Physical Exam Constitutional:      Comments: Frail-appearing  Caucasian female patient.  In a wheelchair.  Accompanied by family.  HENT:     Head: Normocephalic and atraumatic.     Mouth/Throat:     Pharynx: No oropharyngeal exudate.  Eyes:     Pupils: Pupils are equal, round, and reactive to light.  Cardiovascular:     Rate and Rhythm: Normal rate and regular rhythm.  Pulmonary:     Effort: No respiratory distress.     Breath sounds: No wheezing.  Abdominal:     General: Bowel sounds are normal. There is no distension.     Palpations: Abdomen is soft. There is no mass.     Tenderness: There is no abdominal tenderness. There is no guarding or rebound.  Musculoskeletal:        General: No tenderness. Normal range of motion.     Cervical back: Normal range of motion and neck supple.  Skin:    General: Skin is warm.  Neurological:     Mental Status: She is alert and oriented to person, place, and time.     Comments: Chronic right sided weakness/previous stroke  Psychiatric:        Mood and Affect: Affect normal.     LABORATORY DATA:  I have reviewed the data as listed Lab Results  Component Value Date   WBC 5.9 09/08/2019   HGB 8.4 (L) 09/08/2019   HCT 26.7 (L) 09/08/2019   MCV 82.9 09/08/2019   PLT 197 09/08/2019   Recent Labs    08/14/19 1401 08/20/19 1300 09/08/19 1037  NA 130* 137 138  K 4.5 3.5 3.4*  CL 94* 96* 98  CO2 26 32 29  GLUCOSE 197* 84 221*  BUN 40* 39* 35*  CREATININE 1.45* 2.02* 1.79*  CALCIUM 9.4 9.2 9.4  GFRNONAA 37* 25* 29*  GFRAA 43* 29* 33*  PROT  --  7.5  --   ALBUMIN  --  3.7  --   AST  --  18  --   ALT  --  16  --   ALKPHOS  --  57  --   BILITOT  --  0.5  --      DG Chest 2 View  Result Date: 08/21/2019 CLINICAL DATA:  Dyspnea.  Anemia.  Hypertension.  Diabetes. EXAM: CHEST - 2 VIEW COMPARISON:  01/03/2018 FINDINGS: Mild hyperinflation. Midline trachea. Normal heart size. Atherosclerosis in the transverse aorta. Tortuous thoracic aorta. No pleural effusion or pneumothorax. Clear lungs.  IMPRESSION: No acute cardiopulmonary disease. Aortic Atherosclerosis (ICD10-I70.0). Electronically Signed   By: Abigail Miyamoto M.D.   On: 08/21/2019 17:34   US RENAL  Result Date: 08/30/2019 CLINICAL DATA:  Acute on chronic renal failure, history diabetes mellitus, hypertension, stroke EXAM: RENAL / URINARY TRACT ULTRASOUND COMPLETE COMPARISON:  03/25/2019 FINDINGS: Right Kidney: Renal measurements: 9.2 x 5.2 x 5.0 cm = volume: 124 mL. Normal cortical thickness. Increased cortical echogenicity. Small cyst at lower pole 2.1 x 2.0 x 2.1 cm. Minimally prominent extrarenal pelvis. No mass, hydronephrosis or shadowing calcification. Left Kidney: Renal measurements: 8.6 x 4.9 x 5.1 cm = volume: 112 mL. Cortical thinning. Increased cortical echogenicity. Tiny cyst at lower pole LEFT kidney 7 x 6 x 7 mm. No additional mass, hydronephrosis or shadowing calcification. Bladder: Underdistended. Bladder wall appears slightly prominent, at least in part related to underdistention.  Both ureteral jets are visualized. Other: N/A IMPRESSION: Cortical thinning and medical renal disease changes of both kidneys. Small BILATERAL renal cysts. No evidence of solid renal mass or hydronephrosis. Electronically Signed   By: Lavonia Dana M.D.   On: 08/30/2019 20:20   US Venous Img Lower Unilateral Left (DVT)  Result Date: 08/14/2019 CLINICAL DATA:  69 year old female with pain and swelling in the ankles EXAM: LEFT LOWER EXTREMITY VENOUS DOPPLER ULTRASOUND TECHNIQUE: Gray-scale sonography with graded compression, as well as color Doppler and duplex ultrasound were performed to evaluate the lower extremity deep venous systems from the level of the common femoral vein and including the common femoral, femoral, profunda femoral, popliteal and calf veins including the posterior tibial, peroneal and gastrocnemius veins when visible. The superficial great saphenous vein was also interrogated. Spectral Doppler was utilized to evaluate flow at rest  and with distal augmentation maneuvers in the common femoral, femoral and popliteal veins. COMPARISON:  None. FINDINGS: Contralateral Common Femoral Vein: Respiratory phasicity is normal and symmetric with the symptomatic side. No evidence of thrombus. Normal compressibility. Common Femoral Vein: No evidence of thrombus. Normal compressibility, respiratory phasicity and response to augmentation. Saphenofemoral Junction: No evidence of thrombus. Normal compressibility and flow on color Doppler imaging. Profunda Femoral Vein: No evidence of thrombus. Normal compressibility and flow on color Doppler imaging. Femoral Vein: No evidence of thrombus. Normal compressibility, respiratory phasicity and response to augmentation. Popliteal Vein: No evidence of thrombus. Normal compressibility, respiratory phasicity and response to augmentation. Calf Veins: No evidence of thrombus. Normal compressibility and flow on color Doppler imaging. Superficial Great Saphenous Vein: No evidence of thrombus. Normal compressibility and flow on color Doppler imaging. Other Findings:  None. IMPRESSION: Sonographic survey of the left lower extremity negative for DVT Electronically Signed   By: Corrie Mckusick D.O.   On: 08/14/2019 16:31    Normocytic anemia #Anemia hemoglobin 8-9-chronic; likely secondary to chronic kidney disease.  Hemoglobin 8.4 iron saturation 11 ferritin 30s; recommend IV Venofer given symptomatic disease.  #Extreme fatigue/unintentional weight loss-recent chest x-ray-no acute process.  Recommend a CT of the abdomen pelvis without contrast for further evaluation.  #Chronic kidney disease-stage III-IV-unclear etiology; ultrasound kidneys-no hydronephrosis medical renal disease.  Patient awaiting nephrology evaluation.  #Dyspnea on exertion-likely secondary to fatigue/wheezing question bronchitis-recommend albuterol.  # DISPOSITION:will call with results # venofer today;  # Venofer weekly x3 # CT ab/pevis in 1  week # Follow up in 6 weeks; MD; labs- cbc/bmp; possible venofer- Dr.B  All questions were answered. The patient knows to call the clinic with any problems, questions or concerns.   Cammie Sickle, MD 09/08/2019 1:42 PM

## 2019-09-08 NOTE — Assessment & Plan Note (Addendum)
#  Anemia hemoglobin 8-9-chronic; likely secondary to chronic kidney disease.  Hemoglobin 8.4 iron saturation 11 ferritin 30s; recommend IV Venofer given symptomatic disease.  #Extreme fatigue/unintentional weight loss-recent chest x-ray-no acute process.  Recommend a CT of the abdomen pelvis without contrast for further evaluation.  #Chronic kidney disease-stage III-IV-unclear etiology; ultrasound kidneys-no hydronephrosis medical renal disease.  Patient awaiting nephrology evaluation.  #Dyspnea on exertion-likely secondary to fatigue/wheezing question bronchitis-recommend albuterol.  # DISPOSITION:will call with results # venofer today;  # Venofer weekly x3 # CT ab/pevis in 1 week # Follow up in 6 weeks; MD; labs- cbc/bmp; possible venofer- Dr.B

## 2019-09-15 ENCOUNTER — Ambulatory Visit
Admission: RE | Admit: 2019-09-15 | Discharge: 2019-09-15 | Disposition: A | Discharge status: Home / Self Care | Payer: Medicare HMO | Source: Ambulatory Visit | Attending: Internal Medicine | Admitting: Internal Medicine

## 2019-09-15 ENCOUNTER — Other Ambulatory Visit: Payer: Self-pay

## 2019-09-15 ENCOUNTER — Inpatient Hospital Stay: Payer: Medicare HMO

## 2019-09-15 DIAGNOSIS — N281 Cyst of kidney, acquired: Secondary | ICD-10-CM | POA: Diagnosis not present

## 2019-09-15 DIAGNOSIS — R634 Abnormal weight loss: Secondary | ICD-10-CM

## 2019-09-15 DIAGNOSIS — D649 Anemia, unspecified: Secondary | ICD-10-CM

## 2019-09-15 DIAGNOSIS — N200 Calculus of kidney: Secondary | ICD-10-CM | POA: Diagnosis not present

## 2019-09-15 DIAGNOSIS — I700 Atherosclerosis of aorta: Secondary | ICD-10-CM | POA: Diagnosis not present

## 2019-09-17 ENCOUNTER — Other Ambulatory Visit: Payer: Self-pay | Admitting: *Deleted

## 2019-09-17 ENCOUNTER — Inpatient Hospital Stay: Payer: Medicare HMO

## 2019-09-17 ENCOUNTER — Other Ambulatory Visit: Payer: Self-pay

## 2019-09-17 ENCOUNTER — Telehealth: Payer: Self-pay | Admitting: Internal Medicine

## 2019-09-17 VITALS — BP 156/88 | HR 82 | Temp 97.1°F | Resp 18

## 2019-09-17 DIAGNOSIS — R634 Abnormal weight loss: Secondary | ICD-10-CM | POA: Diagnosis not present

## 2019-09-17 DIAGNOSIS — I129 Hypertensive chronic kidney disease with stage 1 through stage 4 chronic kidney disease, or unspecified chronic kidney disease: Secondary | ICD-10-CM | POA: Diagnosis not present

## 2019-09-17 DIAGNOSIS — R0609 Other forms of dyspnea: Secondary | ICD-10-CM | POA: Diagnosis not present

## 2019-09-17 DIAGNOSIS — E1122 Type 2 diabetes mellitus with diabetic chronic kidney disease: Secondary | ICD-10-CM | POA: Diagnosis not present

## 2019-09-17 DIAGNOSIS — Z79899 Other long term (current) drug therapy: Secondary | ICD-10-CM | POA: Diagnosis not present

## 2019-09-17 DIAGNOSIS — D631 Anemia in chronic kidney disease: Secondary | ICD-10-CM

## 2019-09-17 DIAGNOSIS — R5383 Other fatigue: Secondary | ICD-10-CM | POA: Diagnosis not present

## 2019-09-17 DIAGNOSIS — D649 Anemia, unspecified: Secondary | ICD-10-CM | POA: Diagnosis not present

## 2019-09-17 DIAGNOSIS — N183 Chronic kidney disease, stage 3 unspecified: Secondary | ICD-10-CM | POA: Diagnosis not present

## 2019-09-17 MED ORDER — SODIUM CHLORIDE 0.9 % IV SOLN
Freq: Once | INTRAVENOUS | Status: AC
  Filled 2019-09-17: qty 250

## 2019-09-17 MED ORDER — SODIUM CHLORIDE 0.9 % IV SOLN: 200.0000 mg | Freq: Once | INTRAVENOUS | Status: DC

## 2019-09-17 MED ORDER — IRON SUCROSE 20 MG/ML IV SOLN
200.0000 mg | Freq: Once | INTRAVENOUS | Status: AC
  Administered 2019-09-17: 200 mg via INTRAVENOUS
  Filled 2019-09-17: qty 10

## 2019-09-17 NOTE — Telephone Encounter (Signed)
Colette - follow-up on nephrology referral.

## 2019-09-17 NOTE — Telephone Encounter (Signed)
FYI- ----------------------------------------------------- Hi Elizabeth Rivera-I wanted to let you know that your CT scan-showed no major concerns-to explain your weight loss; or anemia.  I think anemia is likely from chronic kidney disease.  Await evaluation with the kidney doctor.  For now continue iron infusions.   I tried to reach you yesterday, the phone keep the ringing.  Please call us if any questions/or wecan discuss this further at the next visit. Thank you,   Dr.B

## 2019-09-22 ENCOUNTER — Other Ambulatory Visit: Payer: Self-pay

## 2019-09-22 ENCOUNTER — Inpatient Hospital Stay: Payer: Medicare HMO

## 2019-09-22 VITALS — BP 157/76 | HR 74 | Temp 96.6°F | Resp 17

## 2019-09-22 DIAGNOSIS — I129 Hypertensive chronic kidney disease with stage 1 through stage 4 chronic kidney disease, or unspecified chronic kidney disease: Secondary | ICD-10-CM | POA: Diagnosis not present

## 2019-09-22 DIAGNOSIS — R5383 Other fatigue: Secondary | ICD-10-CM | POA: Diagnosis not present

## 2019-09-22 DIAGNOSIS — R39198 Other difficulties with micturition: Secondary | ICD-10-CM | POA: Diagnosis not present

## 2019-09-22 DIAGNOSIS — R300 Dysuria: Secondary | ICD-10-CM | POA: Diagnosis not present

## 2019-09-22 DIAGNOSIS — R634 Abnormal weight loss: Secondary | ICD-10-CM | POA: Diagnosis not present

## 2019-09-22 DIAGNOSIS — R0609 Other forms of dyspnea: Secondary | ICD-10-CM | POA: Diagnosis not present

## 2019-09-22 DIAGNOSIS — E1122 Type 2 diabetes mellitus with diabetic chronic kidney disease: Secondary | ICD-10-CM | POA: Diagnosis not present

## 2019-09-22 DIAGNOSIS — R8290 Unspecified abnormal findings in urine: Secondary | ICD-10-CM | POA: Diagnosis not present

## 2019-09-22 DIAGNOSIS — Z79899 Other long term (current) drug therapy: Secondary | ICD-10-CM | POA: Diagnosis not present

## 2019-09-22 DIAGNOSIS — D631 Anemia in chronic kidney disease: Secondary | ICD-10-CM

## 2019-09-22 DIAGNOSIS — D649 Anemia, unspecified: Secondary | ICD-10-CM | POA: Diagnosis not present

## 2019-09-22 DIAGNOSIS — N183 Chronic kidney disease, stage 3 unspecified: Secondary | ICD-10-CM | POA: Diagnosis not present

## 2019-09-22 DIAGNOSIS — N184 Chronic kidney disease, stage 4 (severe): Secondary | ICD-10-CM

## 2019-09-22 MED ORDER — IRON SUCROSE 20 MG/ML IV SOLN
200.0000 mg | Freq: Once | INTRAVENOUS | Status: AC
  Administered 2019-09-22: 200 mg via INTRAVENOUS
  Filled 2019-09-22: qty 10

## 2019-09-22 MED ORDER — SODIUM CHLORIDE 0.9 % IV SOLN: 200.0000 mg | Freq: Once | INTRAVENOUS | Status: DC

## 2019-09-22 MED ORDER — SODIUM CHLORIDE 0.9 % IV SOLN
Freq: Once | INTRAVENOUS | Status: AC
  Filled 2019-09-22: qty 250

## 2019-09-22 NOTE — Progress Notes (Signed)
Pt tolerated infusion well. No S/S of distress or reaction noted. Pt and VS stable at discharge.

## 2019-09-24 DIAGNOSIS — E1159 Type 2 diabetes mellitus with other circulatory complications: Secondary | ICD-10-CM | POA: Diagnosis not present

## 2019-09-24 DIAGNOSIS — E1169 Type 2 diabetes mellitus with other specified complication: Secondary | ICD-10-CM | POA: Diagnosis not present

## 2019-09-24 DIAGNOSIS — E785 Hyperlipidemia, unspecified: Secondary | ICD-10-CM | POA: Diagnosis not present

## 2019-09-24 DIAGNOSIS — E039 Hypothyroidism, unspecified: Secondary | ICD-10-CM | POA: Diagnosis not present

## 2019-09-24 DIAGNOSIS — E1142 Type 2 diabetes mellitus with diabetic polyneuropathy: Secondary | ICD-10-CM | POA: Diagnosis not present

## 2019-09-24 DIAGNOSIS — I152 Hypertension secondary to endocrine disorders: Secondary | ICD-10-CM | POA: Diagnosis not present

## 2019-09-24 DIAGNOSIS — G4733 Obstructive sleep apnea (adult) (pediatric): Secondary | ICD-10-CM | POA: Diagnosis not present

## 2019-09-29 ENCOUNTER — Other Ambulatory Visit: Payer: Self-pay

## 2019-09-29 ENCOUNTER — Inpatient Hospital Stay: Payer: Medicare HMO

## 2019-09-29 VITALS — BP 143/70 | HR 72 | Temp 96.5°F | Resp 18

## 2019-09-29 DIAGNOSIS — N183 Chronic kidney disease, stage 3 unspecified: Secondary | ICD-10-CM | POA: Diagnosis not present

## 2019-09-29 DIAGNOSIS — R0609 Other forms of dyspnea: Secondary | ICD-10-CM | POA: Diagnosis not present

## 2019-09-29 DIAGNOSIS — E1122 Type 2 diabetes mellitus with diabetic chronic kidney disease: Secondary | ICD-10-CM | POA: Diagnosis not present

## 2019-09-29 DIAGNOSIS — I129 Hypertensive chronic kidney disease with stage 1 through stage 4 chronic kidney disease, or unspecified chronic kidney disease: Secondary | ICD-10-CM | POA: Diagnosis not present

## 2019-09-29 DIAGNOSIS — R634 Abnormal weight loss: Secondary | ICD-10-CM | POA: Diagnosis not present

## 2019-09-29 DIAGNOSIS — D631 Anemia in chronic kidney disease: Secondary | ICD-10-CM

## 2019-09-29 DIAGNOSIS — R5383 Other fatigue: Secondary | ICD-10-CM | POA: Diagnosis not present

## 2019-09-29 DIAGNOSIS — D649 Anemia, unspecified: Secondary | ICD-10-CM | POA: Diagnosis not present

## 2019-09-29 DIAGNOSIS — Z79899 Other long term (current) drug therapy: Secondary | ICD-10-CM | POA: Diagnosis not present

## 2019-09-29 MED ORDER — SODIUM CHLORIDE 0.9 % IV SOLN
Freq: Once | INTRAVENOUS | Status: AC
  Filled 2019-09-29: qty 250

## 2019-09-29 MED ORDER — SODIUM CHLORIDE 0.9 % IV SOLN: 200.0000 mg | Freq: Once | INTRAVENOUS | Status: DC

## 2019-09-29 MED ORDER — IRON SUCROSE 20 MG/ML IV SOLN
200.0000 mg | Freq: Once | INTRAVENOUS | Status: AC
  Administered 2019-09-29: 200 mg via INTRAVENOUS
  Filled 2019-09-29: qty 10

## 2019-09-29 NOTE — Progress Notes (Signed)
Pt tolerated infusion well. No s/s of distress or reaction noted. Pt and VS stable at discharge.  

## 2019-10-09 ENCOUNTER — Other Ambulatory Visit: Payer: Self-pay | Admitting: Neurology

## 2019-10-09 DIAGNOSIS — D649 Anemia, unspecified: Secondary | ICD-10-CM | POA: Diagnosis not present

## 2019-10-09 DIAGNOSIS — R4189 Other symptoms and signs involving cognitive functions and awareness: Secondary | ICD-10-CM | POA: Diagnosis not present

## 2019-10-09 DIAGNOSIS — G20 Parkinson's disease: Secondary | ICD-10-CM | POA: Diagnosis not present

## 2019-10-09 DIAGNOSIS — G4733 Obstructive sleep apnea (adult) (pediatric): Secondary | ICD-10-CM | POA: Diagnosis not present

## 2019-10-09 DIAGNOSIS — Z8673 Personal history of transient ischemic attack (TIA), and cerebral infarction without residual deficits: Secondary | ICD-10-CM | POA: Diagnosis not present

## 2019-10-09 DIAGNOSIS — N184 Chronic kidney disease, stage 4 (severe): Secondary | ICD-10-CM | POA: Diagnosis not present

## 2019-10-09 DIAGNOSIS — F325 Major depressive disorder, single episode, in full remission: Secondary | ICD-10-CM | POA: Diagnosis not present

## 2019-10-09 DIAGNOSIS — E1142 Type 2 diabetes mellitus with diabetic polyneuropathy: Secondary | ICD-10-CM | POA: Diagnosis not present

## 2019-10-14 DIAGNOSIS — R8290 Unspecified abnormal findings in urine: Secondary | ICD-10-CM | POA: Diagnosis not present

## 2019-10-14 DIAGNOSIS — R350 Frequency of micturition: Secondary | ICD-10-CM | POA: Diagnosis not present

## 2019-10-14 DIAGNOSIS — E1165 Type 2 diabetes mellitus with hyperglycemia: Secondary | ICD-10-CM | POA: Diagnosis not present

## 2019-10-20 ENCOUNTER — Other Ambulatory Visit: Payer: Self-pay

## 2019-10-20 ENCOUNTER — Inpatient Hospital Stay: Payer: Medicare HMO

## 2019-10-20 ENCOUNTER — Inpatient Hospital Stay (HOSPITAL_BASED_OUTPATIENT_CLINIC_OR_DEPARTMENT_OTHER): Payer: Medicare HMO | Admitting: Internal Medicine

## 2019-10-20 ENCOUNTER — Inpatient Hospital Stay: Payer: Medicare HMO | Attending: Internal Medicine

## 2019-10-20 VITALS — BP 167/76 | HR 75 | Resp 16

## 2019-10-20 DIAGNOSIS — D696 Thrombocytopenia, unspecified: Secondary | ICD-10-CM | POA: Diagnosis not present

## 2019-10-20 DIAGNOSIS — R634 Abnormal weight loss: Secondary | ICD-10-CM

## 2019-10-20 DIAGNOSIS — Z8719 Personal history of other diseases of the digestive system: Secondary | ICD-10-CM | POA: Insufficient documentation

## 2019-10-20 DIAGNOSIS — Z8249 Family history of ischemic heart disease and other diseases of the circulatory system: Secondary | ICD-10-CM | POA: Insufficient documentation

## 2019-10-20 DIAGNOSIS — Z90722 Acquired absence of ovaries, bilateral: Secondary | ICD-10-CM | POA: Insufficient documentation

## 2019-10-20 DIAGNOSIS — Z8349 Family history of other endocrine, nutritional and metabolic diseases: Secondary | ICD-10-CM | POA: Diagnosis not present

## 2019-10-20 DIAGNOSIS — Z8673 Personal history of transient ischemic attack (TIA), and cerebral infarction without residual deficits: Secondary | ICD-10-CM | POA: Insufficient documentation

## 2019-10-20 DIAGNOSIS — M549 Dorsalgia, unspecified: Secondary | ICD-10-CM | POA: Diagnosis not present

## 2019-10-20 DIAGNOSIS — R531 Weakness: Secondary | ICD-10-CM | POA: Diagnosis not present

## 2019-10-20 DIAGNOSIS — N1832 Chronic kidney disease, stage 3b: Secondary | ICD-10-CM

## 2019-10-20 DIAGNOSIS — D649 Anemia, unspecified: Secondary | ICD-10-CM | POA: Diagnosis not present

## 2019-10-20 DIAGNOSIS — M2550 Pain in unspecified joint: Secondary | ICD-10-CM | POA: Insufficient documentation

## 2019-10-20 DIAGNOSIS — Z800 Family history of malignant neoplasm of digestive organs: Secondary | ICD-10-CM | POA: Insufficient documentation

## 2019-10-20 DIAGNOSIS — Z8042 Family history of malignant neoplasm of prostate: Secondary | ICD-10-CM | POA: Diagnosis not present

## 2019-10-20 DIAGNOSIS — Z882 Allergy status to sulfonamides status: Secondary | ICD-10-CM | POA: Diagnosis not present

## 2019-10-20 DIAGNOSIS — Z9049 Acquired absence of other specified parts of digestive tract: Secondary | ICD-10-CM | POA: Diagnosis not present

## 2019-10-20 DIAGNOSIS — R0602 Shortness of breath: Secondary | ICD-10-CM | POA: Insufficient documentation

## 2019-10-20 DIAGNOSIS — Z803 Family history of malignant neoplasm of breast: Secondary | ICD-10-CM | POA: Insufficient documentation

## 2019-10-20 DIAGNOSIS — Z86018 Personal history of other benign neoplasm: Secondary | ICD-10-CM | POA: Diagnosis not present

## 2019-10-20 DIAGNOSIS — D631 Anemia in chronic kidney disease: Secondary | ICD-10-CM | POA: Diagnosis not present

## 2019-10-20 DIAGNOSIS — R5383 Other fatigue: Secondary | ICD-10-CM | POA: Insufficient documentation

## 2019-10-20 DIAGNOSIS — N184 Chronic kidney disease, stage 4 (severe): Secondary | ICD-10-CM

## 2019-10-20 DIAGNOSIS — M1990 Unspecified osteoarthritis, unspecified site: Secondary | ICD-10-CM | POA: Diagnosis not present

## 2019-10-20 DIAGNOSIS — Z821 Family history of blindness and visual loss: Secondary | ICD-10-CM | POA: Insufficient documentation

## 2019-10-20 DIAGNOSIS — Z79899 Other long term (current) drug therapy: Secondary | ICD-10-CM | POA: Diagnosis not present

## 2019-10-20 LAB — BASIC METABOLIC PANEL
Anion gap: 11 (ref 5–15)
BUN: 41 mg/dL — ABNORMAL HIGH (ref 8–23)
CO2: 27 mmol/L (ref 22–32)
Calcium: 9.3 mg/dL (ref 8.9–10.3)
Chloride: 101 mmol/L (ref 98–111)
Creatinine, Ser: 1.65 mg/dL — ABNORMAL HIGH (ref 0.44–1.00)
GFR calc Af Amer: 37 mL/min — ABNORMAL LOW (ref >60)
GFR calc non Af Amer: 32 mL/min — ABNORMAL LOW (ref >60)
Glucose, Bld: 183 mg/dL — ABNORMAL HIGH (ref 70–99)
Potassium: 3.4 mmol/L — ABNORMAL LOW (ref 3.5–5.1)
Sodium: 139 mmol/L (ref 135–145)

## 2019-10-20 LAB — CBC WITH DIFFERENTIAL/PLATELET
Abs Immature Granulocytes: 0.01 10*3/uL (ref 0.00–0.07)
Basophils Absolute: 0 10*3/uL (ref 0.0–0.1)
Basophils Relative: 1 %
Eosinophils Absolute: 0.5 10*3/uL (ref 0.0–0.5)
Eosinophils Relative: 10 %
HCT: 27 % — ABNORMAL LOW (ref 36.0–46.0)
Hemoglobin: 8.9 g/dL — ABNORMAL LOW (ref 12.0–15.0)
Immature Granulocytes: 0 %
Lymphocytes Relative: 20 %
Lymphs Abs: 1 10*3/uL (ref 0.7–4.0)
MCH: 27.8 pg (ref 26.0–34.0)
MCHC: 33 g/dL (ref 30.0–36.0)
MCV: 84.4 fL (ref 80.0–100.0)
Monocytes Absolute: 0.4 10*3/uL (ref 0.1–1.0)
Monocytes Relative: 9 %
Neutro Abs: 3 10*3/uL (ref 1.7–7.7)
Neutrophils Relative %: 60 %
Platelets: 138 10*3/uL — ABNORMAL LOW (ref 150–400)
RBC: 3.2 MIL/uL — ABNORMAL LOW (ref 3.87–5.11)
RDW: 15.6 % — ABNORMAL HIGH (ref 11.5–15.5)
WBC: 5 10*3/uL (ref 4.0–10.5)
nRBC: 0 % (ref 0.0–0.2)

## 2019-10-20 MED ORDER — SODIUM CHLORIDE 0.9 % IV SOLN: 200.0000 mg | Freq: Once | INTRAVENOUS | Status: DC

## 2019-10-20 MED ORDER — IRON SUCROSE 20 MG/ML IV SOLN
200.0000 mg | Freq: Once | INTRAVENOUS | Status: AC
  Administered 2019-10-20: 200 mg via INTRAVENOUS
  Filled 2019-10-20: qty 10

## 2019-10-20 MED ORDER — SODIUM CHLORIDE 0.9 % IV SOLN
Freq: Once | INTRAVENOUS | Status: AC
  Filled 2019-10-20: qty 250

## 2019-10-20 NOTE — Progress Notes (Signed)
[place] NOTE  Patient Care Team: Kirk Ruths, MD as PCP - General (Internal Medicine) Murrell Redden, MD (Unknown Physician Specialty)  CHIEF COMPLAINTS/PURPOSE OF CONSULTATION: Anemia   HEMATOLOGY HISTORY  # ANEMIA hb-8-9; WBC/platelets-N; EGD-August 19, 2019- EGD-WNL [Dr.Wohl]-; colonoscopy-small polyps-[Jan, 2020; Dr.Wohl] ;June 2021-  SPEP/UPEP-NEGATIVE; AUG 2021- CT A/P-NEGATIVE  # CKD- Stage IIIB [GFR-32]; Stroke x 2 [last 2017- ? ? bil LE weakness; [place]]; Rheumatology [Dr.Patel, [place]; DM Luetta Nutting 2021- A1c-9.6]  HISTORY OF PRESENTING ILLNESS:  Elizabeth Rivera 69 y.o.  female with anemia/chronic kidney disease is here for follow-up.  Patient is currently status post IV iron infusion.  Patient continues to complain of severe fatigue.  Complains of daytime sleepiness-has been evaluated by Dr. Brigitte Pulse neurology for somnolescent's awaiting scans.   Review of Systems  Constitutional: Positive for malaise/fatigue and weight loss. Negative for chills, diaphoresis and fever.  HENT: Negative for nosebleeds and sore throat.   Eyes: Negative for double vision.  Respiratory: Positive for shortness of breath. Negative for cough, hemoptysis, sputum production and wheezing.   Cardiovascular: Negative for chest pain, palpitations, orthopnea and leg swelling.  Gastrointestinal: Positive for constipation. Negative for abdominal pain, blood in stool, diarrhea, heartburn, melena, nausea and vomiting.  Genitourinary: Negative for dysuria, frequency and urgency.  Musculoskeletal: Positive for back pain and joint pain.  Skin: Negative.  Negative for itching and rash.  Neurological: Negative for dizziness, tingling, focal weakness, weakness and headaches.  Endo/Heme/Allergies: Does not bruise/bleed easily.  Psychiatric/Behavioral: Negative for depression. The patient is not nervous/anxious and does not have insomnia.     MEDICAL HISTORY:  Past Medical History:   Diagnosis Date  . Anemia   . Anemia of other chronic disease 09/01/2013  . Anxiety   . Arthritis    back s/p fracture x3 approx 1983  . Candidal vaginitis   . Diabetes mellitus without complication ([place])   . Diverticulosis of colon (without mention of hemorrhage) 05/20/2009   Internal hemms (Dr. Vira Agar)  . Forearm fracture    2013, left  . GERD (gastroesophageal reflux disease)    reflux HH 09/01/2002//egd reflux Esoph. HH Gastritis nonbleeding erosive gastropathy Duodentitis 08/15/2006  . Hyperglycemia   . Hyperlipidemia   . Hypertension   . Insomnia, unspecified   . Irritable bowel syndrome    history of  . Nervous breakdown 08/2002   [facility]  . Neuropathy   . Other abnormal blood chemistry   . Other chest pain    chest discomfort  . Other esophagitis    erosive esophagitis  . Other specified disorders of adrenal glands 07/2007   adrenal mass via of CT 1.4cm left Adrenal no change(Dr.Cope)   . Other specified gastritis without mention of hemorrhage    erosvie gastritis  . Stroke ([place]) 03/2014  . Stroke ([place]) 03/2015  . Thyroid disease    hypothyroid, goiter  . Wears dentures    partial upper and lower.    SURGICAL HISTORY: Past Surgical History:  Procedure Laterality Date  . ABDOMINAL HYSTERECTOMY  1995   Part Hyst BSO DUB ovarian cyst B9  . BREAST CYST ASPIRATION Bilateral   . BREAST REDUCTION SURGERY  1984  . CHOLECYSTECTOMY  03/2004  . COLONOSCOPY  1. 08/15/06  2. 05/20/09   Int Hemms  2. Divertics Int Hemms (Dr. Vira Agar)  . COLONOSCOPY WITH PROPOFOL N/A 03/05/2018   Procedure: COLONOSCOPY WITH PROPOFOL;  Surgeon: Lucilla Lame, MD;  [facility] ENDOSCOPY;  Service: Endoscopy;  Laterality: N/A;  .  CT ABD W & PELVIS WO CM  6/09   CT Scan abd stable adrenal adenoma 1.4cm left adrenal no change (Dr. Jacqlyn Larsen)  . CYSTOSCOPY  01/08/2008   Former site of inflammation healed (Dr. Jacqlyn Larsen)  . ESOPHAGOGASTRODUODENOSCOPY  1. 09/01/02  2. 08/15/06   1. Reflux, HH  2.  Reflux esoph HH gaastritis nonbleeding erosive gastropathy duodenitis  . ESOPHAGOGASTRODUODENOSCOPY (EGD) WITH PROPOFOL N/A 06/01/2016   Procedure: ESOPHAGOGASTRODUODENOSCOPY (EGD) WITH PROPOFOL;  Surgeon: Lucilla Lame, MD;  Location: [place];  Service: Endoscopy;  Laterality: N/A;  diabetic - insulin and oral meds  . ESOPHAGOGASTRODUODENOSCOPY (EGD) WITH PROPOFOL N/A 08/19/2019   Procedure: ESOPHAGOGASTRODUODENOSCOPY (EGD) WITH PROPOFOL;  Surgeon: Lucilla Lame, MD;  Location: [facility] ENDOSCOPY;  Service: Endoscopy;  Laterality: N/A;  . NSVD x1  1977  . REDUCTION MAMMAPLASTY  1985  . VAGINAL DELIVERY  1977    SOCIAL HISTORY: Social History   Socioeconomic History  . Marital status: Married    Spouse name: Not on file  . Number of children: Not on file  . Years of education: Not on file  . Highest education level: Not on file  Occupational History  . Not on file  Tobacco Use  . Smoking status: Never Smoker  . Smokeless tobacco: Never Used  Vaping Use  . Vaping Use: Never used  Substance and Sexual Activity  . Alcohol use: No    Alcohol/week: 0.0 standard drinks  . Drug use: No  . Sexual activity: Never  Other Topics Concern  . Not on file  Social History Narrative   No regular exercise.   Worked at [employer] in [place]   Her elderly mother had a CVA and moved in with patient   Social Determinants of Health   Financial Resource Strain:   . Difficulty of Paying Living Expenses: Not on file  Food Insecurity:   . Worried About Charity fundraiser in the Last Year: Not on file  . Ran Out of Food in the Last Year: Not on file  Transportation Needs:   . Lack of Transportation (Medical): Not on file  . Lack of Transportation (Non-Medical): Not on file  Physical Activity:   . Days of Exercise per Week: Not on file  . Minutes of Exercise per Session: Not on file  Stress:   . Feeling of Stress : Not on file  Social Connections:   . Frequency of Communication with  Friends and Family: Not on file  . Frequency of Social Gatherings with Friends and Family: Not on file  . Attends Religious Services: Not on file  . Active Member of Clubs or Organizations: Not on file  . Attends Archivist Meetings: Not on file  . Marital Status: Not on file  Intimate Partner Violence:   . Fear of Current or Ex-Partner: Not on file  . Emotionally Abused: Not on file  . Physically Abused: Not on file  . Sexually Abused: Not on file    FAMILY HISTORY: Family History  Problem Relation Age of Onset  . Hypertension Mother   . Hyperlipidemia Mother   . Stroke Mother        89  . Vision loss Father        vision problems  . Cancer Father        Colon   . Hyperlipidemia Sister   . Hypertension Sister   . Vision loss Sister   . Breast cancer Maternal Grandmother 17  . Breast cancer Cousin  paternal cousin  . Prostate cancer Paternal Grandfather   . Bladder Cancer Neg Hx   . Kidney cancer Neg Hx     ALLERGIES:  is allergic to sulfa antibiotics.  MEDICATIONS:  Current Outpatient Medications  Medication Sig Dispense Refill  . albuterol (VENTOLIN HFA) 108 (90 Base) MCG/ACT inhaler Inhale 2 puffs into the lungs every 6 (six) hours as needed for wheezing or shortness of breath. 8 g 0  . ALPRAZolam (XANAX) 0.25 MG tablet Take 1 tablet (0.25 mg total) by mouth 2 (two) times daily. 30 tablet 0  . atorvastatin (LIPITOR) 40 MG tablet Take 1 tablet (40 mg total) by mouth daily at 6 PM. 90 tablet 3  . bisacodyl (DULCOLAX) 10 MG suppository Place 10 mg rectally as needed for moderate constipation.     . Blood Glucose Calibration (LIBERTY GLUCOSE CONTROL) Normal LIQD Use 1 each as directed    . carvedilol (COREG) 12.5 MG tablet carvedilol 12.5 mg tablet    . cholecalciferol (VITAMIN D) 1000 units tablet Take 1,000 Units by mouth daily.    . clopidogrel (PLAVIX) 75 MG tablet Take 1 tablet (75 mg total) by mouth daily. 90 tablet 1  . Continuous Blood Gluc  Receiver (FREESTYLE LIBRE 2 READER SYSTM) DEVI Use 1 Device as directed    . Continuous Blood Gluc Sensor (FREESTYLE LIBRE 2 SENSOR SYSTM) MISC Use 1 kit every 14 (fourteen) days for glucose monitoring    . CRANBERRY PO Take by mouth daily.    . DROPLET PEN NEEDLES 31G X 5 MM MISC     . famotidine (PEPCID) 20 MG tablet TAKE 1 TABLET EVERY DAY 90 tablet 1  . FERREX 150 150 MG capsule     . ferrous sulfate ([employer] IRON) 325 (65 FE) MG tablet Take 1 tablet (325 mg total) by mouth daily with breakfast.    . furosemide (LASIX) 20 MG tablet Take by mouth.    . gabapentin (NEURONTIN) 100 MG capsule Take 200 mg by mouth 3 (three) times daily.     Marland Kitchen glimepiride (AMARYL) 4 MG tablet glimepiride 4 mg tablet    . glucose blood (PRECISION QID TEST) test strip 1 each (1 strip total) by XX route 3 (three) times daily Use as instructed.    . hydrochlorothiazide (HYDRODIURIL) 25 MG tablet Take 1 tablet (25 mg total) by mouth daily. 90 tablet 1  . insulin aspart (NOVOLOG) 100 UNIT/ML injection Inject 5 Units into the skin 3 (three) times daily before meals.    . insulin glargine (LANTUS) 100 UNIT/ML injection Inject 9 Units into the skin at bedtime.    Marland Kitchen levothyroxine (SYNTHROID) 125 MCG tablet Take 125 mcg by mouth daily before breakfast.    . Melatonin 5 MG TABS Take 5 mg by mouth at bedtime.    . metFORMIN (GLUCOPHAGE) 500 MG tablet Twice a day    . mirabegron ER (MYRBETRIQ) 25 MG TB24 tablet Take 1 tablet (25 mg total) by mouth daily. 30 tablet 11  . mirtazapine (REMERON) 7.5 MG tablet     . pantoprazole (PROTONIX) 40 MG tablet Take 1 tablet (40 mg total) by mouth 2 (two) times daily. 180 tablet 1  . venlafaxine XR (EFFEXOR-XR) 150 MG 24 hr capsule Take 1 capsule (150 mg total) by mouth 2 (two) times daily. 180 capsule 3  . metoprolol succinate (TOPROL-XL) 100 MG 24 hr tablet Take 1 tablet (100 mg total) by mouth daily. Take with or immediately following a meal. (Patient not taking: Reported  on 08/20/2019) 90  tablet 3   No current facility-administered medications for this visit.      PHYSICAL EXAMINATION:   Vitals:   10/20/19 1008  BP: 133/73  Pulse: 80  Resp: 14  Temp: (!) 96.2 F (35.7 C)  SpO2: 100%   Filed Weights   10/20/19 1008  Weight: 116 lb (52.6 kg)    Physical Exam Constitutional:      Comments: Frail-appearing Caucasian female patient.  In a wheelchair.  Accompanied by family.  HENT:     Head: Normocephalic and atraumatic.     Mouth/Throat:     Pharynx: No oropharyngeal exudate.  Eyes:     Pupils: Pupils are equal, round, and reactive to light.  Cardiovascular:     Rate and Rhythm: Normal rate and regular rhythm.  Pulmonary:     Effort: No respiratory distress.     Breath sounds: No wheezing.  Abdominal:     General: Bowel sounds are normal. There is no distension.     Palpations: Abdomen is soft. There is no mass.     Tenderness: There is no abdominal tenderness. There is no guarding or rebound.  Musculoskeletal:        General: No tenderness. Normal range of motion.     Cervical back: Normal range of motion and neck supple.  Skin:    General: Skin is warm.  Neurological:     Mental Status: She is alert and oriented to person, place, and time.     Comments: Chronic right sided weakness/previous stroke  Psychiatric:        Mood and Affect: Affect normal.     LABORATORY DATA:  I have reviewed the data as listed Lab Results  Component Value Date   WBC 5.0 10/20/2019   HGB 8.9 (L) 10/20/2019   HCT 27.0 (L) 10/20/2019   MCV 84.4 10/20/2019   PLT 138 (L) 10/20/2019   Recent Labs    08/20/19 1300 09/08/19 1037 10/20/19 0959  NA 137 138 139  K 3.5 3.4* 3.4*  CL 96* 98 101  CO2 32 29 27  GLUCOSE 84 221* 183*  BUN 39* 35* 41*  CREATININE 2.02* 1.79* 1.65*  CALCIUM 9.2 9.4 9.3  GFRNONAA 25* 29* 32*  GFRAA 29* 33* 37*  PROT 7.5  --   --   ALBUMIN 3.7  --   --   AST 18  --   --   ALT 16  --   --   ALKPHOS 57  --   --   BILITOT 0.5  --    --      No results found.  Anemia due to stage 3b chronic kidney disease #Anemia hemoglobin 8-9-chronic; likely secondary to chronic kidney disease [aug 2021-iron saturation 11 ferritin 30s]. S/p Venofer; today  Hemoglobin 8.9. proceed with Venofer given symptomatic disease.  STABLE.   # Discussed use of erythropoietin stimulating agents like Aranesp to stimulate the bone marrow.  Discussed the potential issues with erythropoietin estimating agents-given the risk of stroke thromboembolic events/elevated blood pressure.  However, most of the serious events did not happen when the goal hematocrit is 33/hemoglobin 30.  I also discussed the use of IV iron infusion; and the potential infusion reactions.  #Thrombocytopenia-platelets 138; monitor for now closely.  # # Excessive sleepiness/ jerking movements of extrmities- s/p evaluation with Dr.Shah; awaiting imaging.   #Chronic kidney disease-stage III-IV-unclear etiology; ultrasound kidneys-no hydronephrosis medical renal disease.  Patient awaiting nephrology evaluation- October 2nd.  # DISPOSITION: #  venofer today;  # Follow up in 4 weeks; MD; labs- cbc/bmp; Retacrit- Dr.B  All questions were answered. The patient knows to call the clinic with any problems, questions or concerns.   Cammie Sickle, MD 10/20/2019 12:55 PM

## 2019-10-20 NOTE — Assessment & Plan Note (Addendum)
#  Anemia hemoglobin 8-9-chronic; likely secondary to chronic kidney disease [aug 2021-iron saturation 11 ferritin 30s]. S/p Venofer; today  Hemoglobin 8.9. proceed with Venofer given symptomatic disease.  STABLE.   # Discussed use of erythropoietin stimulating agents like Aranesp to stimulate the bone marrow.  Discussed the potential issues with erythropoietin estimating agents-given the risk of stroke thromboembolic events/elevated blood pressure.  However, most of the serious events did not happen when the goal hematocrit is 33/hemoglobin 30.  I also discussed the use of IV iron infusion; and the potential infusion reactions.  #Thrombocytopenia-platelets 138; monitor for now closely.  # # Excessive sleepiness/ jerking movements of extrmities- s/p evaluation with Dr.Shah; awaiting imaging.   #Chronic kidney disease-stage III-IV-unclear etiology; ultrasound kidneys-no hydronephrosis medical renal disease.  Patient awaiting nephrology evaluation- October 2nd.  # DISPOSITION: # venofer today;  # Follow up in 4 weeks; MD; labs- cbc/bmp; Retacrit- Dr.B

## 2019-10-20 NOTE — Assessment & Plan Note (Deleted)
#  Anemia hemoglobin 8-9-chronic; likely secondary to chronic kidney disease [aug 2021-iron saturation 11 ferritin 30s]. S/p Venofer;   Hemoglobin 8.9. proceed with Venofer given symptomatic disease.   # Discussed use of erythropoietin stimulating agents like Aranesp to stimulate the bone marrow.  Discussed the potential issues with erythropoietin estimating agents-given the risk of stroke thromboembolic events/elevated blood pressure.  However, most of the serious events did not happen when the goal hematocrit is 33/hemoglobin 30.  I also discussed the use of IV iron infusion; and the potential infusion reactions.   # # Excessive sleepiness/ jerking movements of extrmities- s/p evaluation with Dr.Shah; awaiting imaging.   #Chronic kidney disease-stage III-IV-unclear etiology; ultrasound kidneys-no hydronephrosis medical renal disease.  Patient awaiting nephrology evaluation- October 2nd.  # DISPOSITION: # venofer today;  # Follow up in 4 weeks; MD; labs- cbc/bmp; Retacrit- Dr.B

## 2019-10-24 DIAGNOSIS — G4733 Obstructive sleep apnea (adult) (pediatric): Secondary | ICD-10-CM | POA: Diagnosis not present

## 2019-10-25 DIAGNOSIS — G4733 Obstructive sleep apnea (adult) (pediatric): Secondary | ICD-10-CM | POA: Diagnosis not present

## 2019-10-28 DIAGNOSIS — D2371 Other benign neoplasm of skin of right lower limb, including hip: Secondary | ICD-10-CM | POA: Diagnosis not present

## 2019-10-28 DIAGNOSIS — E1142 Type 2 diabetes mellitus with diabetic polyneuropathy: Secondary | ICD-10-CM | POA: Diagnosis not present

## 2019-10-28 DIAGNOSIS — L909 Atrophic disorder of skin, unspecified: Secondary | ICD-10-CM | POA: Diagnosis not present

## 2019-10-28 DIAGNOSIS — M21621 Bunionette of right foot: Secondary | ICD-10-CM | POA: Diagnosis not present

## 2019-10-30 DIAGNOSIS — Z794 Long term (current) use of insulin: Secondary | ICD-10-CM | POA: Diagnosis not present

## 2019-10-30 DIAGNOSIS — E1169 Type 2 diabetes mellitus with other specified complication: Secondary | ICD-10-CM | POA: Diagnosis not present

## 2019-10-30 DIAGNOSIS — Z23 Encounter for immunization: Secondary | ICD-10-CM | POA: Diagnosis not present

## 2019-10-30 DIAGNOSIS — I10 Essential (primary) hypertension: Secondary | ICD-10-CM | POA: Diagnosis not present

## 2019-10-30 DIAGNOSIS — E039 Hypothyroidism, unspecified: Secondary | ICD-10-CM | POA: Diagnosis not present

## 2019-10-30 DIAGNOSIS — E1142 Type 2 diabetes mellitus with diabetic polyneuropathy: Secondary | ICD-10-CM | POA: Diagnosis not present

## 2019-10-30 DIAGNOSIS — E785 Hyperlipidemia, unspecified: Secondary | ICD-10-CM | POA: Diagnosis not present

## 2019-10-30 DIAGNOSIS — R350 Frequency of micturition: Secondary | ICD-10-CM | POA: Diagnosis not present

## 2019-11-01 ENCOUNTER — Ambulatory Visit
Admission: RE | Admit: 2019-11-01 | Discharge: 2019-11-01 | Disposition: A | Discharge status: Home / Self Care | Payer: Medicare HMO | Source: Ambulatory Visit | Attending: Neurology | Admitting: Neurology

## 2019-11-01 DIAGNOSIS — G319 Degenerative disease of nervous system, unspecified: Secondary | ICD-10-CM | POA: Diagnosis not present

## 2019-11-01 DIAGNOSIS — I619 Nontraumatic intracerebral hemorrhage, unspecified: Secondary | ICD-10-CM | POA: Diagnosis not present

## 2019-11-01 DIAGNOSIS — J322 Chronic ethmoidal sinusitis: Secondary | ICD-10-CM | POA: Diagnosis not present

## 2019-11-01 DIAGNOSIS — J0120 Acute ethmoidal sinusitis, unspecified: Secondary | ICD-10-CM | POA: Diagnosis not present

## 2019-11-01 DIAGNOSIS — G20 Parkinson's disease: Secondary | ICD-10-CM

## 2019-11-01 NOTE — Progress Notes (Signed)
11/04/2019 2:08 PM   Elizabeth Rivera 18-Nov-1950 [id number]  Referring provider: Kirk Ruths, MD [facility] [place],  [place] [address] Chief Complaint  Patient presents with  . Recurrent UTI    HPI: Elizabeth Rivera is a 69 y.o. female who returns for a evaluation of abdominal findings on urinalysis. Patient is accompanied by her husband.  She is requesting to be seen by a physician of female gender.  The patient was seen in the past Dr. Jacqlyn Larsen and Dr. Pilar Jarvis at [facility] for incomplete bladder emptying, urge incontinence, urgency and frequency. Anticholinergic drugs were avoided in the past given her mental status.   RUS 03/25/2019 Diffusely echogenic kidneys, compatible with medical renal disease.  No hydronephrosis.  She was last seen by Zara Council, PA-C on 04/02/2019. (see note for details)  Routine Microscopic UA on 10/30/2019 showed 4-10 WBC and many bacteria. Urine culture grew E. Coli, klebsiella pneumoniae and Proteus mirabilis. PCP placed patient on Cipro course.   Patient is unable to void. PVR is 135 mL. PCP's office called and noted her treatment has been switched to Ceftin given results of urine culture.   She notes that she has been Myrbetriq 25 mg. She had minimal daytime symptoms. She has leakage at night. She wears depends. She wakes up to soaked bed sheets. She is taking 4 different medications at night time.   She reports urgency, frequency and nocturia. She reports some burning with urination and urinary hesitancy. She has cloudy and foul smelling urine.   She reports yeast infection associated with antibiotics.   Her diabetes is poorly controlled. She is actively working on getting her diabetes under control.   Her ankles are swollen. Her husband notes that she is wearing compression stockings. He makes sure that she elevates her feet.   (+) urine cultures: 08/15/2019: E. Coli, klebsiella pneumoniae and Proteus  mirabilis. 09/22/2019: E. Coli, klebsiella pneumoniae and Proteus mirabilis. 10/14/2019: E. Coli, klebsiella pneumoniae and Proteus mirabilis. 10/30/2019: E. Coli, klebsiella pneumoniae and Proteus mirabilis.  She has a history of strokes.    PMH: Past Medical History:  Diagnosis Date  . Anemia   . Anemia of other chronic disease 09/01/2013  . Anxiety   . Arthritis    back s/p fracture x3 approx 1983  . Candidal vaginitis   . Diabetes mellitus without complication ([place])   . Diverticulosis of colon (without mention of hemorrhage) 05/20/2009   Internal hemms (Dr. Vira Agar)  . Forearm fracture    2013, left  . GERD (gastroesophageal reflux disease)    reflux HH 09/01/2002//egd reflux Esoph. HH Gastritis nonbleeding erosive gastropathy Duodentitis 08/15/2006  . Hyperglycemia   . Hyperlipidemia   . Hypertension   . Insomnia, unspecified   . Irritable bowel syndrome    history of  . Nervous breakdown 08/2002   [facility]  . Neuropathy   . Other abnormal blood chemistry   . Other chest pain    chest discomfort  . Other esophagitis    erosive esophagitis  . Other specified disorders of adrenal glands 07/2007   adrenal mass via of CT 1.4cm left Adrenal no change(Dr.Cope)   . Other specified gastritis without mention of hemorrhage    erosvie gastritis  . Stroke ([place]) 03/2014  . Stroke ([place]) 03/2015  . Thyroid disease    hypothyroid, goiter  . Wears dentures    partial upper and lower.    Surgical History: Past Surgical History:  Procedure  Laterality Date  . ABDOMINAL HYSTERECTOMY  1995   Part Hyst BSO DUB ovarian cyst B9  . BREAST CYST ASPIRATION Bilateral   . BREAST REDUCTION SURGERY  1984  . CHOLECYSTECTOMY  03/2004  . COLONOSCOPY  1. 08/15/06  2. 05/20/09   Int Hemms  2. Divertics Int Hemms (Dr. Vira Agar)  . COLONOSCOPY WITH PROPOFOL N/A 03/05/2018   Procedure: COLONOSCOPY WITH PROPOFOL;  Surgeon: Lucilla Lame, MD;  Location: [facility] ENDOSCOPY;  Service: Endoscopy;   Laterality: N/A;  . CT ABD W & PELVIS WO CM  6/09   CT Scan abd stable adrenal adenoma 1.4cm left adrenal no change (Dr. Jacqlyn Larsen)  . CYSTOSCOPY  01/08/2008   Former site of inflammation healed (Dr. Jacqlyn Larsen)  . ESOPHAGOGASTRODUODENOSCOPY  1. 09/01/02  2. 08/15/06   1. Reflux, HH  2. Reflux esoph HH gaastritis nonbleeding erosive gastropathy duodenitis  . ESOPHAGOGASTRODUODENOSCOPY (EGD) WITH PROPOFOL N/A 06/01/2016   Procedure: ESOPHAGOGASTRODUODENOSCOPY (EGD) WITH PROPOFOL;  Surgeon: Lucilla Lame, MD;  Location: [place];  Service: Endoscopy;  Laterality: N/A;  diabetic - insulin and oral meds  . ESOPHAGOGASTRODUODENOSCOPY (EGD) WITH PROPOFOL N/A 08/19/2019   Procedure: ESOPHAGOGASTRODUODENOSCOPY (EGD) WITH PROPOFOL;  Surgeon: Lucilla Lame, MD;  Location: [facility] ENDOSCOPY;  Service: Endoscopy;  Laterality: N/A;  . NSVD x1  1977  . REDUCTION MAMMAPLASTY  1985  . VAGINAL DELIVERY  1977    Home Medications:  Allergies as of 11/04/2019      Reactions   Sulfa Antibiotics Rash, Anaphylaxis   And angioedmea      Medication List       Accurate as of November 04, 2019 11:59 PM. If you have any questions, ask your nurse or doctor.        albuterol 108 (90 Base) MCG/ACT inhaler Commonly known as: VENTOLIN HFA Inhale 2 puffs into the lungs every 6 (six) hours as needed for wheezing or shortness of breath.   ALPRAZolam 0.25 MG tablet Commonly known as: XANAX Take 1 tablet (0.25 mg total) by mouth 2 (two) times daily.   atorvastatin 40 MG tablet Commonly known as: LIPITOR Take 1 tablet (40 mg total) by mouth daily at 6 PM.   carbidopa-levodopa 25-100 MG tablet Commonly known as: SINEMET IR   carvedilol 12.5 MG tablet Commonly known as: COREG carvedilol 12.5 mg tablet   cefUROXime 500 MG tablet Commonly known as: CEFTIN Take by mouth.   cholecalciferol 1000 units tablet Commonly known as: VITAMIN D Take 1,000 Units by mouth daily.   clopidogrel 75 MG tablet Commonly known as:  PLAVIX Take 1 tablet (75 mg total) by mouth daily.   CRANBERRY PO Take by mouth daily.   Droplet Pen Needles 31G X 5 MM Misc Generic drug: Insulin Pen Needle   Dulcolax 10 MG suppository Generic drug: bisacodyl Place 10 mg rectally as needed for moderate constipation.   famotidine 20 MG tablet Commonly known as: PEPCID TAKE 1 TABLET EVERY DAY   Ferrex 150 150 MG capsule Generic drug: iron polysaccharides   ferrous sulfate 325 (65 FE) MG tablet Commonly known as: [employer] Iron Take 1 tablet (325 mg total) by mouth daily with breakfast.   FreeStyle Libre 2 Reader [employer] Use 1 Device as directed   [employer] 2 Sensor Systm Misc Use 1 kit every 14 (fourteen) days for glucose monitoring   furosemide 20 MG tablet Commonly known as: LASIX Take by mouth.   gabapentin 100 MG capsule Commonly known as: NEURONTIN Take 200 mg by mouth 3 (three)  times daily.   glimepiride 4 MG tablet Commonly known as: AMARYL glimepiride 4 mg tablet   hydrochlorothiazide 25 MG tablet Commonly known as: HYDRODIURIL Take 1 tablet (25 mg total) by mouth daily.   insulin aspart 100 UNIT/ML injection Commonly known as: novoLOG Inject 5 Units into the skin 3 (three) times daily before meals.   insulin glargine 100 UNIT/ML injection Commonly known as: LANTUS Inject 9 Units into the skin at bedtime.   levothyroxine 125 MCG tablet Commonly known as: SYNTHROID Take 125 mcg by mouth daily before breakfast.   [employer] Glucose Control Normal Liqd Use 1 each as directed   melatonin 5 MG Tabs Take 5 mg by mouth at bedtime.   metFORMIN 500 MG tablet Commonly known as: GLUCOPHAGE Twice a day   metoprolol succinate 100 MG 24 hr tablet Commonly known as: TOPROL-XL Take 1 tablet (100 mg total) by mouth daily. Take with or immediately following a meal.   mirabegron ER 25 MG Tb24 tablet Commonly known as: MYRBETRIQ Take 1 tablet (25 mg total) by mouth daily.   mirtazapine 7.5 MG  tablet Commonly known as: REMERON   pantoprazole 40 MG tablet Commonly known as: PROTONIX Take 1 tablet (40 mg total) by mouth 2 (two) times daily.   Precision QID Test test strip Generic drug: glucose blood 1 each (1 strip total) by XX route 3 (three) times daily Use as instructed.   venlafaxine XR 150 MG 24 hr capsule Commonly known as: EFFEXOR-XR Take 1 capsule (150 mg total) by mouth 2 (two) times daily.       Allergies:  Allergies  Allergen Reactions  . Sulfa Antibiotics Rash and Anaphylaxis    And angioedmea    Family History: Family History  Problem Relation Age of Onset  . Hypertension Mother   . Hyperlipidemia Mother   . Stroke Mother        31  . Vision loss Father        vision problems  . Cancer Father        Colon   . Hyperlipidemia Sister   . Hypertension Sister   . Vision loss Sister   . Breast cancer Maternal Grandmother 45  . Breast cancer Cousin        paternal cousin  . Prostate cancer Paternal Grandfather   . Bladder Cancer Neg Hx   . Kidney cancer Neg Hx     Social History:  reports that she has never smoked. She has never used smokeless tobacco. She reports that she does not drink alcohol and does not use drugs.   Physical Exam: BP (!) 166/76   Pulse 83   Ht 5' 2"  (1.575 m)   Wt 117 lb (53.1 kg)   BMI 21.40 kg/m   Constitutional:  Alert and oriented, No acute distress.  Accompanied by husband.  Appears much older than stated age. HEENT: [place] AT, moist mucus membranes.  Trachea midline, no masses. Cardiovascular: No clubbing, cyanosis, or edema. Respiratory: Normal respiratory effort, no increased work of breathing. Skin: No rashes, bruises or suspicious lesions. Musculoskeletal: Bilateral ankle edema.  Neurologic: Grossly intact, no focal deficits, moving all 4 extremities. Psychiatric: Normal mood and affect.  Laboratory Data:  Lab Results  Component Value Date   CREATININE 1.65 (H) 10/20/2019    Pertinent  Imaging: Results for orders placed or performed in visit on 11/04/19  BLADDER SCAN AMB NON-IMAGING  Result Value Ref Range   Scan Result 141m      Assessment &  Plan:    1. Incomplete bladder emptying PVR has ranged from 100-200's.  PVR is stable at 135 mL.  2. rUTI Discussed symptoms are not specific and may be related to specimen collection versus chronic colonization.  Would only treat for symptoms including dysuria, fevers, chills, and/or pain  3. OAB  Continue Mrybetriq.  We do lengthy discussion today that her OAB is likely multifactorial including history of stroke, poorly controlled diabetes, lower extremity edema, ambulatory issues, etc.  Given her cognitive issues, agree with staying away from anticholinergics.  We will have to be very careful about any OAB treatments or meds given her history of incomplete bladder emptying  Hesitant to consider Botox for this patient due to concern for development of frank urinary retention.  May consider PTNS this is relatively benign treatment although time commitment is an issue.  Until her diabetes is under better control, would avoid implant of InterStim type device given concern for infection.  We had a very frank discussion today that her treatment options are fairly limited at this point in time and will require to get her diabetes under much better control before we can consider other treatment options.  She understands completely and is agreeable this plan.  Goals of care were discussed.  4. Nocturnal enuresis  Long term standing History of stroke. Pt has heavy night time sedation, lower extremity swelling, and very  poorly controlled diabetes which are all contributing factors. Pt states she will be receiving insulin pump in mail soon.  There are minimal treatment options at this time.    Overall extremely complicated patient with limited options for her complaints today.  We will plan to have her follow-up with Larene Beach as she  already has an established relationship with her in a few months to see if she is able to better control her diabetes and how this affects her urination.  [facility] [address] [place], [place] [address] [phone]  I, Selena Batten, am acting as a scribe for Dr. Hollice Espy.  I have reviewed the above documentation for accuracy and completeness, and I agree with the above.   Hollice Espy, MD   I spent 30 total minutes on the day of the encounter including pre-visit review of the medical record, face-to-face time with the patient, and post visit ordering of labs/imaging/tests.

## 2019-11-04 ENCOUNTER — Ambulatory Visit: Payer: Medicare HMO | Admitting: Urology

## 2019-11-04 ENCOUNTER — Other Ambulatory Visit: Payer: Self-pay

## 2019-11-04 ENCOUNTER — Encounter: Payer: Self-pay | Admitting: Urology

## 2019-11-04 VITALS — BP 166/76 | HR 83 | Ht 62.0 in | Wt 117.0 lb

## 2019-11-04 DIAGNOSIS — N3281 Overactive bladder: Secondary | ICD-10-CM | POA: Diagnosis not present

## 2019-11-04 LAB — BLADDER SCAN AMB NON-IMAGING

## 2019-11-06 DIAGNOSIS — E1142 Type 2 diabetes mellitus with diabetic polyneuropathy: Secondary | ICD-10-CM | POA: Diagnosis not present

## 2019-11-12 ENCOUNTER — Other Ambulatory Visit: Payer: Self-pay | Admitting: Gastroenterology

## 2019-11-13 DIAGNOSIS — R809 Proteinuria, unspecified: Secondary | ICD-10-CM | POA: Diagnosis not present

## 2019-11-13 DIAGNOSIS — E1122 Type 2 diabetes mellitus with diabetic chronic kidney disease: Secondary | ICD-10-CM | POA: Diagnosis not present

## 2019-11-13 DIAGNOSIS — R82998 Other abnormal findings in urine: Secondary | ICD-10-CM | POA: Diagnosis not present

## 2019-11-13 DIAGNOSIS — E1165 Type 2 diabetes mellitus with hyperglycemia: Secondary | ICD-10-CM | POA: Diagnosis not present

## 2019-11-13 DIAGNOSIS — I10 Essential (primary) hypertension: Secondary | ICD-10-CM | POA: Diagnosis not present

## 2019-11-13 DIAGNOSIS — N1832 Chronic kidney disease, stage 3b: Secondary | ICD-10-CM | POA: Diagnosis not present

## 2019-11-17 ENCOUNTER — Other Ambulatory Visit: Payer: Self-pay | Admitting: Nephrology

## 2019-11-17 DIAGNOSIS — G20 Parkinson's disease: Secondary | ICD-10-CM | POA: Diagnosis not present

## 2019-11-17 DIAGNOSIS — G214 Vascular parkinsonism: Secondary | ICD-10-CM | POA: Diagnosis not present

## 2019-11-17 DIAGNOSIS — N179 Acute kidney failure, unspecified: Secondary | ICD-10-CM

## 2019-11-18 ENCOUNTER — Other Ambulatory Visit: Payer: Self-pay | Admitting: *Deleted

## 2019-11-18 ENCOUNTER — Inpatient Hospital Stay: Payer: Medicare HMO | Attending: Internal Medicine

## 2019-11-18 ENCOUNTER — Inpatient Hospital Stay (HOSPITAL_BASED_OUTPATIENT_CLINIC_OR_DEPARTMENT_OTHER): Payer: Medicare HMO | Admitting: Internal Medicine

## 2019-11-18 ENCOUNTER — Inpatient Hospital Stay: Payer: Medicare HMO

## 2019-11-18 ENCOUNTER — Other Ambulatory Visit: Payer: Self-pay

## 2019-11-18 ENCOUNTER — Encounter: Payer: Self-pay | Admitting: Internal Medicine

## 2019-11-18 DIAGNOSIS — D631 Anemia in chronic kidney disease: Secondary | ICD-10-CM

## 2019-11-18 DIAGNOSIS — N184 Chronic kidney disease, stage 4 (severe): Secondary | ICD-10-CM

## 2019-11-18 DIAGNOSIS — N1832 Chronic kidney disease, stage 3b: Secondary | ICD-10-CM | POA: Diagnosis not present

## 2019-11-18 DIAGNOSIS — D649 Anemia, unspecified: Secondary | ICD-10-CM

## 2019-11-18 LAB — CBC WITH DIFFERENTIAL/PLATELET
Abs Immature Granulocytes: 0.01 10*3/uL (ref 0.00–0.07)
Basophils Absolute: 0 10*3/uL (ref 0.0–0.1)
Basophils Relative: 1 %
Eosinophils Absolute: 0.8 10*3/uL — ABNORMAL HIGH (ref 0.0–0.5)
Eosinophils Relative: 15 %
HCT: 27.9 % — ABNORMAL LOW (ref 36.0–46.0)
Hemoglobin: 9.1 g/dL — ABNORMAL LOW (ref 12.0–15.0)
Immature Granulocytes: 0 %
Lymphocytes Relative: 26 %
Lymphs Abs: 1.4 10*3/uL (ref 0.7–4.0)
MCH: 28.5 pg (ref 26.0–34.0)
MCHC: 32.6 g/dL (ref 30.0–36.0)
MCV: 87.5 fL (ref 80.0–100.0)
Monocytes Absolute: 0.5 10*3/uL (ref 0.1–1.0)
Monocytes Relative: 9 %
Neutro Abs: 2.6 10*3/uL (ref 1.7–7.7)
Neutrophils Relative %: 49 %
Platelets: 158 10*3/uL (ref 150–400)
RBC: 3.19 MIL/uL — ABNORMAL LOW (ref 3.87–5.11)
RDW: 15.4 % (ref 11.5–15.5)
WBC: 5.3 10*3/uL (ref 4.0–10.5)
nRBC: 0 % (ref 0.0–0.2)

## 2019-11-18 LAB — BASIC METABOLIC PANEL
Anion gap: 10 (ref 5–15)
BUN: 63 mg/dL — ABNORMAL HIGH (ref 8–23)
CO2: 30 mmol/L (ref 22–32)
Calcium: 9.7 mg/dL (ref 8.9–10.3)
Chloride: 100 mmol/L (ref 98–111)
Creatinine, Ser: 2.21 mg/dL — ABNORMAL HIGH (ref 0.44–1.00)
GFR, Estimated: 22 mL/min — ABNORMAL LOW (ref >60)
Glucose, Bld: 146 mg/dL — ABNORMAL HIGH (ref 70–99)
Potassium: 4.2 mmol/L (ref 3.5–5.1)
Sodium: 140 mmol/L (ref 135–145)

## 2019-11-18 MED ORDER — SODIUM CHLORIDE 0.9 % IV SOLN
Freq: Once | INTRAVENOUS | Status: DC
  Filled 2019-11-18: qty 250

## 2019-11-18 MED ORDER — HEPARIN SOD (PORK) LOCK FLUSH 100 UNIT/ML IV SOLN
250.0000 [IU] | Freq: Once | INTRAVENOUS | Status: DC | PRN
  Filled 2019-11-18: qty 5

## 2019-11-18 MED ORDER — SODIUM CHLORIDE 0.9% FLUSH
10.0000 mL | Freq: Once | INTRAVENOUS | Status: DC | PRN
  Filled 2019-11-18: qty 10

## 2019-11-18 MED ORDER — SODIUM CHLORIDE 0.9% FLUSH
3.0000 mL | Freq: Once | INTRAVENOUS | Status: DC | PRN
  Filled 2019-11-18: qty 3

## 2019-11-18 MED ORDER — EPOETIN ALFA-EPBX 10000 UNIT/ML IJ SOLN
20000.0000 [IU] | Freq: Once | INTRAMUSCULAR | Status: AC
  Administered 2019-11-18: 20000 [IU] via SUBCUTANEOUS
  Filled 2019-11-18: qty 2

## 2019-11-18 MED ORDER — HEPARIN SOD (PORK) LOCK FLUSH 100 UNIT/ML IV SOLN
500.0000 [IU] | Freq: Once | INTRAVENOUS | Status: DC | PRN
  Filled 2019-11-18: qty 5

## 2019-11-18 MED ORDER — ALTEPLASE 2 MG IJ SOLR
2.0000 mg | Freq: Once | INTRAMUSCULAR | Status: DC | PRN
  Filled 2019-11-18: qty 2

## 2019-11-18 NOTE — Assessment & Plan Note (Addendum)
#  Anemia hemoglobin 8-9-chronic; likely secondary to chronic kidney disease [aug 2021-iron saturation 11 ferritin 30s].  Today Hemoglobin-9.1s/p Venofer. Proceed with retacrit today.   # Mechanical falls/ Excessive sleepiness/ jerking movements of extrmities- s/p evaluation with Dr.Shah; on carbidopa-levodopa. Awaiting PT.   #Chronic kidney disease-stage III-IV-unclear etiology;  S/p evaluation with Dr.Singh.   # DISPOSITION: # Retacrit # Follow up in 4 weeks; MD; labs- cbc/bmp; Retacrit- Dr.B

## 2019-11-18 NOTE — Progress Notes (Signed)
[facility] NOTE  Patient Care Team: Kirk Ruths, MD as PCP - General (Internal Medicine) Murrell Redden, MD (Unknown Physician Specialty)  CHIEF COMPLAINTS/PURPOSE OF CONSULTATION: Anemia   HEMATOLOGY HISTORY  # ANEMIA hb-8-9; WBC/platelets-N; EGD-August 19, 2019- EGD-WNL [Dr.Wohl]-; colonoscopy-small polyps-[Jan, 2020; Dr.Wohl] ;June 2021-  SPEP/UPEP-NEGATIVE; AUG 2021- CT A/P-NEGATIVE  # CKD- Stage IIIB [GFR-32; Dr.Singh-Oct 2021]; Stroke x 2 [last 2017- ? ? bil LE weakness; [place]]; Rheumatology [Dr.Patel, [place]]; DM [March 2021- A1c-9.6]; neurology;Dr.Shah-? parkinson  Oct 2021- Carbidopa-levodopa  HISTORY OF PRESENTING ILLNESS:  Elizabeth Rivera 69 y.o.  female with anemia/chronic kidney disease is here for follow-up.  In the interim patient was evaluated by neurology, started on carbidopa levodopa.  Patient unfortunately continues to fall/gait instability.  Patient is awaiting physical therapy.  Patient status post evaluation with nephrology.  Awaiting further renal work-up for her chronic kidney disease.  Continues to complain of moderate fatigue.  No blood in stools or black or stools.  Review of Systems  Constitutional: Positive for malaise/fatigue and weight loss. Negative for chills, diaphoresis and fever.  HENT: Negative for nosebleeds and sore throat.   Eyes: Negative for double vision.  Respiratory: Positive for shortness of breath. Negative for cough, hemoptysis, sputum production and wheezing.   Cardiovascular: Negative for chest pain, palpitations, orthopnea and leg swelling.  Gastrointestinal: Positive for constipation. Negative for abdominal pain, blood in stool, diarrhea, heartburn, melena, nausea and vomiting.  Genitourinary: Negative for dysuria, frequency and urgency.  Musculoskeletal: Positive for back pain and joint pain.  Skin: Negative.  Negative for itching and rash.  Neurological: Negative for dizziness, tingling, focal weakness,  weakness and headaches.  Endo/Heme/Allergies: Does not bruise/bleed easily.  Psychiatric/Behavioral: Negative for depression. The patient is not nervous/anxious and does not have insomnia.     MEDICAL HISTORY:  Past Medical History:  Diagnosis Date  . Anemia   . Anemia of other chronic disease 09/01/2013  . Anxiety   . Arthritis    back s/p fracture x3 approx 1983  . Candidal vaginitis   . Cognitive impairment   . Diabetes mellitus without complication ([place])   . Diverticulosis of colon (without mention of hemorrhage) 05/20/2009   Internal hemms (Dr. Vira Agar)  . Forearm fracture    2013, left  . GERD (gastroesophageal reflux disease)    reflux HH 09/01/2002//egd reflux Esoph. HH Gastritis nonbleeding erosive gastropathy Duodentitis 08/15/2006  . Hyperglycemia   . Hyperlipidemia   . Hypertension   . Insomnia, unspecified   . Irritable bowel syndrome    history of  . Nervous breakdown 08/2002   [facility]  . Neuropathy   . Other abnormal blood chemistry   . Other chest pain    chest discomfort  . Other esophagitis    erosive esophagitis  . Other specified disorders of adrenal glands 07/2007   adrenal mass via of CT 1.4cm left Adrenal no change(Dr.Cope)   . Other specified gastritis without mention of hemorrhage    erosvie gastritis  . Parkinsons disease, secondary ([place])   . Stroke ([place]) 03/2014  . Stroke ([place]) 03/2015  . Thyroid disease    hypothyroid, goiter  . Wears dentures    partial upper and lower.    SURGICAL HISTORY: Past Surgical History:  Procedure Laterality Date  . ABDOMINAL HYSTERECTOMY  1995   Part Hyst BSO DUB ovarian cyst B9  . BREAST CYST ASPIRATION Bilateral   . BREAST REDUCTION SURGERY  1984  . CHOLECYSTECTOMY  03/2004  .  COLONOSCOPY  1. 08/15/06  2. 05/20/09   Int Hemms  2. Divertics Int Hemms (Dr. Vira Agar)  . COLONOSCOPY WITH PROPOFOL N/A 03/05/2018   Procedure: COLONOSCOPY WITH PROPOFOL;  Surgeon: Lucilla Lame, MD;  Location: [facility] ENDOSCOPY;   Service: Endoscopy;  Laterality: N/A;  . CT ABD W & PELVIS WO CM  6/09   CT Scan abd stable adrenal adenoma 1.4cm left adrenal no change (Dr. Jacqlyn Larsen)  . CYSTOSCOPY  01/08/2008   Former site of inflammation healed (Dr. Jacqlyn Larsen)  . ESOPHAGOGASTRODUODENOSCOPY  1. 09/01/02  2. 08/15/06   1. Reflux, HH  2. Reflux esoph HH gaastritis nonbleeding erosive gastropathy duodenitis  . ESOPHAGOGASTRODUODENOSCOPY (EGD) WITH PROPOFOL N/A 06/01/2016   Procedure: ESOPHAGOGASTRODUODENOSCOPY (EGD) WITH PROPOFOL;  Surgeon: Lucilla Lame, MD;  Location: [place];  Service: Endoscopy;  Laterality: N/A;  diabetic - insulin and oral meds  . ESOPHAGOGASTRODUODENOSCOPY (EGD) WITH PROPOFOL N/A 08/19/2019   Procedure: ESOPHAGOGASTRODUODENOSCOPY (EGD) WITH PROPOFOL;  Surgeon: Lucilla Lame, MD;  Location: [facility] ENDOSCOPY;  Service: Endoscopy;  Laterality: N/A;  . NSVD x1  1977  . REDUCTION MAMMAPLASTY  1985  . VAGINAL DELIVERY  1977    SOCIAL HISTORY: Social History   Socioeconomic History  . Marital status: Married    Spouse name: Not on file  . Number of children: Not on file  . Years of education: Not on file  . Highest education level: Not on file  Occupational History  . Not on file  Tobacco Use  . Smoking status: Never Smoker  . Smokeless tobacco: Never Used  Vaping Use  . Vaping Use: Never used  Substance and Sexual Activity  . Alcohol use: No    Alcohol/week: 0.0 standard drinks  . Drug use: No  . Sexual activity: Never  Other Topics Concern  . Not on file  Social History Narrative   No regular exercise.   Worked at [employer] in [place]   Her elderly mother had a CVA and moved in with patient   Social Determinants of Health   Financial Resource Strain:   . Difficulty of Paying Living Expenses: Not on file  Food Insecurity:   . Worried About Charity fundraiser in the Last Year: Not on file  . Ran Out of Food in the Last Year: Not on file  Transportation Needs:   . Lack of  Transportation (Medical): Not on file  . Lack of Transportation (Non-Medical): Not on file  Physical Activity:   . Days of Exercise per Week: Not on file  . Minutes of Exercise per Session: Not on file  Stress:   . Feeling of Stress : Not on file  Social Connections:   . Frequency of Communication with Friends and Family: Not on file  . Frequency of Social Gatherings with Friends and Family: Not on file  . Attends Religious Services: Not on file  . Active Member of Clubs or Organizations: Not on file  . Attends Archivist Meetings: Not on file  . Marital Status: Not on file  Intimate Partner Violence:   . Fear of Current or Ex-Partner: Not on file  . Emotionally Abused: Not on file  . Physically Abused: Not on file  . Sexually Abused: Not on file    FAMILY HISTORY: Family History  Problem Relation Age of Onset  . Hypertension Mother   . Hyperlipidemia Mother   . Stroke Mother        53  . Vision loss Father  vision problems  . Cancer Father        Colon   . Hyperlipidemia Sister   . Hypertension Sister   . Vision loss Sister   . Breast cancer Maternal Grandmother 68  . Breast cancer Cousin        paternal cousin  . Prostate cancer Paternal Grandfather   . Bladder Cancer Neg Hx   . Kidney cancer Neg Hx     ALLERGIES:  is allergic to sulfa antibiotics.  MEDICATIONS:  Current Outpatient Medications  Medication Sig Dispense Refill  . ALPRAZolam (XANAX) 0.25 MG tablet Take 1 tablet (0.25 mg total) by mouth 2 (two) times daily. 30 tablet 0  . atorvastatin (LIPITOR) 40 MG tablet Take 1 tablet (40 mg total) by mouth daily at 6 PM. 90 tablet 3  . bisacodyl (DULCOLAX) 10 MG suppository Place 10 mg rectally as needed for moderate constipation.     . Blood Glucose Calibration (LIBERTY GLUCOSE CONTROL) Normal LIQD Use 1 each as directed    . carbidopa-levodopa (SINEMET IR) 25-100 MG tablet Take 1 tablet by mouth 3 (three) times daily.     . carvedilol (COREG)  12.5 MG tablet carvedilol 12.5 mg tablet    . cholecalciferol (VITAMIN D) 1000 units tablet Take 1,000 Units by mouth daily.    . clopidogrel (PLAVIX) 75 MG tablet Take 1 tablet (75 mg total) by mouth daily. 90 tablet 1  . Continuous Blood Gluc Receiver (FREESTYLE LIBRE 2 READER SYSTM) DEVI Use 1 Device as directed    . Continuous Blood Gluc Sensor (FREESTYLE LIBRE 2 SENSOR SYSTM) MISC Use 1 kit every 14 (fourteen) days for glucose monitoring    . CRANBERRY PO Take 1 capsule by mouth daily.     . DROPLET PEN NEEDLES 31G X 5 MM MISC     . famotidine (PEPCID) 20 MG tablet TAKE 1 TABLET EVERY DAY 90 tablet 1  . ferrous sulfate ([employer] IRON) 325 (65 FE) MG tablet Take 1 tablet (325 mg total) by mouth daily with breakfast.    . gabapentin (NEURONTIN) 100 MG capsule Take 200 mg by mouth 3 (three) times daily.     Marland Kitchen glimepiride (AMARYL) 4 MG tablet glimepiride 4 mg tablet    . glucose blood (PRECISION QID TEST) test strip 1 each (1 strip total) by XX route 3 (three) times daily Use as instructed.    . insulin aspart (NOVOLOG) 100 UNIT/ML injection Inject 5 Units into the skin 3 (three) times daily before meals.    . insulin glargine (LANTUS) 100 UNIT/ML injection Inject into the skin at bedtime. Per sliding scal    . levothyroxine (SYNTHROID) 125 MCG tablet Take 125 mcg by mouth daily before breakfast.    . Melatonin 5 MG TABS Take 5 mg by mouth at bedtime.    . metFORMIN (GLUCOPHAGE) 500 MG tablet Take 500 mg by mouth 2 (two) times daily with a meal. Twice a day    . mirabegron ER (MYRBETRIQ) 25 MG TB24 tablet Take 1 tablet (25 mg total) by mouth daily. 30 tablet 11  . mirtazapine (REMERON) 7.5 MG tablet Take 7.5 mg by mouth at bedtime.     . pantoprazole (PROTONIX) 40 MG tablet Take 1 tablet (40 mg total) by mouth 2 (two) times daily. 180 tablet 1  . venlafaxine XR (EFFEXOR-XR) 150 MG 24 hr capsule Take 1 capsule (150 mg total) by mouth 2 (two) times daily. 180 capsule 3  . albuterol (VENTOLIN HFA) 108  (90 Base)  MCG/ACT inhaler Inhale 2 puffs into the lungs every 6 (six) hours as needed for wheezing or shortness of breath. (Patient not taking: Reported on 11/18/2019) 8 g 0   No current facility-administered medications for this visit.   Facility-Administered Medications Ordered in Other Visits  Medication Dose Route Frequency Provider Last Rate Last Admin  . sodium chloride flush (NS) 0.9 % injection 3 mL  3 mL Intracatheter Once PRN Cammie Sickle, MD          PHYSICAL EXAMINATION:   Vitals:   11/18/19 1004  BP: 137/70  Pulse: 75  Resp: 20  Temp: (!) 96.8 F (36 C)   Filed Weights   11/18/19 1004  Weight: 111 lb (50.3 kg)    Physical Exam Constitutional:      Comments: [place] Caucasian female patient.  In a wheelchair.  Accompanied by family.  HENT:     Head: Normocephalic and atraumatic.     Mouth/Throat:     Pharynx: No oropharyngeal exudate.  Eyes:     Pupils: Pupils are equal, round, and reactive to light.  Cardiovascular:     Rate and Rhythm: Normal rate and regular rhythm.  Pulmonary:     Effort: No respiratory distress.     Breath sounds: No wheezing.  Abdominal:     General: Bowel sounds are normal. There is no distension.     Palpations: Abdomen is soft. There is no mass.     Tenderness: There is no abdominal tenderness. There is no guarding or rebound.  Musculoskeletal:        General: No tenderness. Normal range of motion.     Cervical back: Normal range of motion and neck supple.  Skin:    General: Skin is warm.  Neurological:     Mental Status: She is alert and oriented to person, place, and time.     Comments: Chronic right sided weakness/previous stroke  Psychiatric:        Mood and Affect: Affect normal.     LABORATORY DATA:  I have reviewed the data as listed Lab Results  Component Value Date   WBC 5.3 11/18/2019   HGB 9.1 (L) 11/18/2019   HCT 27.9 (L) 11/18/2019   MCV 87.5 11/18/2019   PLT 158 11/18/2019   Recent  Labs    08/20/19 1300 08/20/19 1300 09/08/19 1037 10/20/19 0959 11/18/19 0935  NA 137   < > 138 139 140  K 3.5   < > 3.4* 3.4* 4.2  CL 96*   < > 98 101 100  CO2 32   < > 29 27 30   GLUCOSE 84   < > 221* 183* 146*  BUN 39*   < > 35* 41* 63*  CREATININE 2.02*   < > 1.79* 1.65* 2.21*  CALCIUM 9.2   < > 9.4 9.3 9.7  GFRNONAA 25*   < > 29* 32* 22*  GFRAA 29*  --  33* 37*  --   PROT 7.5  --   --   --   --   ALBUMIN 3.7  --   --   --   --   AST 18  --   --   --   --   ALT 16  --   --   --   --   ALKPHOS 57  --   --   --   --   BILITOT 0.5  --   --   --   --    < > =  values in this interval not displayed.     MR BRAIN WO CONTRAST  Result Date: 11/01/2019 CLINICAL DATA:  Parkinson's disease EXAM: MRI HEAD WITHOUT CONTRAST TECHNIQUE: Multiplanar, multiecho pulse sequences of the brain and surrounding structures were obtained without intravenous contrast. COMPARISON:  01/03/2018 head CT and prior. 01/24/2016 MRI head and prior. FINDINGS: Brain: No diffusion-weighted signal abnormality. Remote microhemorrhages involving the posterior cerebrum, bilateral cerebellum, pons and basal ganglia/thalami are slightly increased since prior exam. Mild cerebral atrophy with ex vacuo dilatation. Supratentorial white matter and pontine T2/FLAIR hyperintense foci. Sequela of remote right basal ganglia and left periventricular white matter insults. No midline shift, ventriculomegaly or extra-axial fluid collection. No mass lesion. Vascular: Normal flow voids. Skull and upper cervical spine: Normal marrow signal. Sinuses/Orbits: Normal orbits. Mild ethmoid sinus mucosal thickening. No mastoid effusion. Other: None. IMPRESSION: Centralized remote microhemorrhages involving the bilateral cerebrum/cerebellum are slightly more conspicuous than prior exam and may reflect sequela of hypertensive microangiopathy. Mild cerebral atrophy. Advanced chronic microvascular ischemic changes. Remote right basal ganglia and left  periventricular white matter insults. Electronically Signed   By: Primitivo Gauze M.D.   On: 11/01/2019 20:54    Anemia due to stage 3b chronic kidney disease #Anemia hemoglobin 8-9-chronic; likely secondary to chronic kidney disease [aug 2021-iron saturation 11 ferritin 30s].  Today Hemoglobin-9.1s/p Venofer. Proceed with retacrit today.   # Mechanical falls/ Excessive sleepiness/ jerking movements of extrmities- s/p evaluation with Dr.Shah; on carbidopa-levodopa. Awaiting PT.   #Chronic kidney disease-stage III-IV-unclear etiology;  S/p evaluation with Dr.Singh.   # DISPOSITION: # Retacrit # Follow up in 4 weeks; MD; labs- cbc/bmp; Retacrit- Dr.B  All questions were answered. The patient knows to call the clinic with any problems, questions or concerns.   Cammie Sickle, MD 11/18/2019 12:30 PM

## 2019-11-20 ENCOUNTER — Emergency Department
Admission: EM | Admit: 2019-11-20 | Discharge: 2019-11-20 | Discharge status: Against Medical Advice | Payer: Medicare HMO

## 2019-11-20 DIAGNOSIS — G20 Parkinson's disease: Secondary | ICD-10-CM | POA: Diagnosis not present

## 2019-11-20 DIAGNOSIS — R079 Chest pain, unspecified: Secondary | ICD-10-CM | POA: Diagnosis not present

## 2019-11-20 DIAGNOSIS — Z8673 Personal history of transient ischemic attack (TIA), and cerebral infarction without residual deficits: Secondary | ICD-10-CM | POA: Diagnosis not present

## 2019-11-20 DIAGNOSIS — E1140 Type 2 diabetes mellitus with diabetic neuropathy, unspecified: Secondary | ICD-10-CM | POA: Diagnosis not present

## 2019-11-20 DIAGNOSIS — S01112A Laceration without foreign body of left eyelid and periocular area, initial encounter: Secondary | ICD-10-CM | POA: Diagnosis not present

## 2019-11-20 DIAGNOSIS — E1122 Type 2 diabetes mellitus with diabetic chronic kidney disease: Secondary | ICD-10-CM | POA: Diagnosis not present

## 2019-11-20 DIAGNOSIS — I129 Hypertensive chronic kidney disease with stage 1 through stage 4 chronic kidney disease, or unspecified chronic kidney disease: Secondary | ICD-10-CM | POA: Diagnosis not present

## 2019-11-20 DIAGNOSIS — F418 Other specified anxiety disorders: Secondary | ICD-10-CM | POA: Diagnosis not present

## 2019-11-20 DIAGNOSIS — M25552 Pain in left hip: Secondary | ICD-10-CM | POA: Diagnosis not present

## 2019-11-20 DIAGNOSIS — Z681 Body mass index (BMI) 19 or less, adult: Secondary | ICD-10-CM | POA: Diagnosis not present

## 2019-11-20 DIAGNOSIS — Z66 Do not resuscitate: Secondary | ICD-10-CM | POA: Diagnosis not present

## 2019-11-20 DIAGNOSIS — Z20822 Contact with and (suspected) exposure to covid-19: Secondary | ICD-10-CM | POA: Diagnosis not present

## 2019-11-20 DIAGNOSIS — N3281 Overactive bladder: Secondary | ICD-10-CM | POA: Diagnosis not present

## 2019-11-20 DIAGNOSIS — R296 Repeated falls: Secondary | ICD-10-CM | POA: Diagnosis not present

## 2019-11-20 DIAGNOSIS — I517 Cardiomegaly: Secondary | ICD-10-CM | POA: Diagnosis not present

## 2019-11-20 DIAGNOSIS — S0990XA Unspecified injury of head, initial encounter: Secondary | ICD-10-CM | POA: Diagnosis not present

## 2019-11-20 DIAGNOSIS — Z23 Encounter for immunization: Secondary | ICD-10-CM | POA: Diagnosis not present

## 2019-11-20 DIAGNOSIS — N184 Chronic kidney disease, stage 4 (severe): Secondary | ICD-10-CM | POA: Diagnosis not present

## 2019-11-21 ENCOUNTER — Ambulatory Visit: Payer: Medicare HMO

## 2019-11-21 DIAGNOSIS — N184 Chronic kidney disease, stage 4 (severe): Secondary | ICD-10-CM | POA: Diagnosis not present

## 2019-11-21 DIAGNOSIS — E1140 Type 2 diabetes mellitus with diabetic neuropathy, unspecified: Secondary | ICD-10-CM | POA: Diagnosis not present

## 2019-11-21 DIAGNOSIS — G20 Parkinson's disease: Secondary | ICD-10-CM | POA: Diagnosis not present

## 2019-11-21 DIAGNOSIS — E1122 Type 2 diabetes mellitus with diabetic chronic kidney disease: Secondary | ICD-10-CM | POA: Diagnosis not present

## 2019-11-21 DIAGNOSIS — R296 Repeated falls: Secondary | ICD-10-CM | POA: Diagnosis not present

## 2019-11-21 DIAGNOSIS — M25552 Pain in left hip: Secondary | ICD-10-CM | POA: Diagnosis not present

## 2019-11-21 DIAGNOSIS — F418 Other specified anxiety disorders: Secondary | ICD-10-CM | POA: Diagnosis not present

## 2019-11-21 DIAGNOSIS — D631 Anemia in chronic kidney disease: Secondary | ICD-10-CM | POA: Diagnosis not present

## 2019-11-21 DIAGNOSIS — E43 Unspecified severe protein-calorie malnutrition: Secondary | ICD-10-CM | POA: Diagnosis not present

## 2019-11-22 DIAGNOSIS — R296 Repeated falls: Secondary | ICD-10-CM | POA: Diagnosis not present

## 2019-11-22 DIAGNOSIS — E1122 Type 2 diabetes mellitus with diabetic chronic kidney disease: Secondary | ICD-10-CM | POA: Diagnosis not present

## 2019-11-22 DIAGNOSIS — S0181XA Laceration without foreign body of other part of head, initial encounter: Secondary | ICD-10-CM | POA: Diagnosis not present

## 2019-11-22 DIAGNOSIS — M25552 Pain in left hip: Secondary | ICD-10-CM | POA: Diagnosis not present

## 2019-11-22 DIAGNOSIS — G20 Parkinson's disease: Secondary | ICD-10-CM | POA: Diagnosis not present

## 2019-11-22 DIAGNOSIS — D62 Acute posthemorrhagic anemia: Secondary | ICD-10-CM | POA: Diagnosis not present

## 2019-11-22 DIAGNOSIS — E1140 Type 2 diabetes mellitus with diabetic neuropathy, unspecified: Secondary | ICD-10-CM | POA: Diagnosis not present

## 2019-11-22 DIAGNOSIS — N184 Chronic kidney disease, stage 4 (severe): Secondary | ICD-10-CM | POA: Diagnosis not present

## 2019-11-22 DIAGNOSIS — N179 Acute kidney failure, unspecified: Secondary | ICD-10-CM | POA: Diagnosis not present

## 2019-11-23 DIAGNOSIS — R296 Repeated falls: Secondary | ICD-10-CM | POA: Diagnosis not present

## 2019-11-23 DIAGNOSIS — R410 Disorientation, unspecified: Secondary | ICD-10-CM | POA: Diagnosis not present

## 2019-11-23 DIAGNOSIS — N390 Urinary tract infection, site not specified: Secondary | ICD-10-CM | POA: Diagnosis not present

## 2019-11-23 DIAGNOSIS — G20 Parkinson's disease: Secondary | ICD-10-CM | POA: Diagnosis not present

## 2019-11-24 DIAGNOSIS — G4733 Obstructive sleep apnea (adult) (pediatric): Secondary | ICD-10-CM | POA: Diagnosis not present

## 2019-11-24 DIAGNOSIS — R296 Repeated falls: Secondary | ICD-10-CM | POA: Diagnosis not present

## 2019-11-24 DIAGNOSIS — G20 Parkinson's disease: Secondary | ICD-10-CM | POA: Diagnosis not present

## 2019-11-24 DIAGNOSIS — N390 Urinary tract infection, site not specified: Secondary | ICD-10-CM | POA: Diagnosis not present

## 2019-11-24 DIAGNOSIS — B9620 Unspecified Escherichia coli [E. coli] as the cause of diseases classified elsewhere: Secondary | ICD-10-CM | POA: Diagnosis not present

## 2019-11-24 DIAGNOSIS — D62 Acute posthemorrhagic anemia: Secondary | ICD-10-CM | POA: Diagnosis not present

## 2019-11-25 DIAGNOSIS — B9620 Unspecified Escherichia coli [E. coli] as the cause of diseases classified elsewhere: Secondary | ICD-10-CM | POA: Diagnosis not present

## 2019-11-25 DIAGNOSIS — N390 Urinary tract infection, site not specified: Secondary | ICD-10-CM | POA: Diagnosis not present

## 2019-11-25 DIAGNOSIS — G20 Parkinson's disease: Secondary | ICD-10-CM | POA: Diagnosis not present

## 2019-11-25 DIAGNOSIS — Z681 Body mass index (BMI) 19 or less, adult: Secondary | ICD-10-CM | POA: Diagnosis not present

## 2019-11-25 DIAGNOSIS — R296 Repeated falls: Secondary | ICD-10-CM | POA: Diagnosis not present

## 2019-11-28 DIAGNOSIS — E1142 Type 2 diabetes mellitus with diabetic polyneuropathy: Secondary | ICD-10-CM | POA: Diagnosis not present

## 2019-11-28 DIAGNOSIS — I10 Essential (primary) hypertension: Secondary | ICD-10-CM | POA: Diagnosis not present

## 2019-11-28 DIAGNOSIS — Z09 Encounter for follow-up examination after completed treatment for conditions other than malignant neoplasm: Secondary | ICD-10-CM | POA: Diagnosis not present

## 2019-11-28 DIAGNOSIS — Z794 Long term (current) use of insulin: Secondary | ICD-10-CM | POA: Diagnosis not present

## 2019-11-28 DIAGNOSIS — E1169 Type 2 diabetes mellitus with other specified complication: Secondary | ICD-10-CM | POA: Diagnosis not present

## 2019-11-28 DIAGNOSIS — G20 Parkinson's disease: Secondary | ICD-10-CM | POA: Diagnosis not present

## 2019-11-28 DIAGNOSIS — E039 Hypothyroidism, unspecified: Secondary | ICD-10-CM | POA: Diagnosis not present

## 2019-12-04 DIAGNOSIS — G20 Parkinson's disease: Secondary | ICD-10-CM | POA: Diagnosis not present

## 2019-12-04 DIAGNOSIS — E1122 Type 2 diabetes mellitus with diabetic chronic kidney disease: Secondary | ICD-10-CM | POA: Diagnosis not present

## 2019-12-04 DIAGNOSIS — B9620 Unspecified Escherichia coli [E. coli] as the cause of diseases classified elsewhere: Secondary | ICD-10-CM | POA: Diagnosis not present

## 2019-12-04 DIAGNOSIS — E039 Hypothyroidism, unspecified: Secondary | ICD-10-CM | POA: Diagnosis not present

## 2019-12-04 DIAGNOSIS — E1140 Type 2 diabetes mellitus with diabetic neuropathy, unspecified: Secondary | ICD-10-CM | POA: Diagnosis not present

## 2019-12-04 DIAGNOSIS — N390 Urinary tract infection, site not specified: Secondary | ICD-10-CM | POA: Diagnosis not present

## 2019-12-04 DIAGNOSIS — I129 Hypertensive chronic kidney disease with stage 1 through stage 4 chronic kidney disease, or unspecified chronic kidney disease: Secondary | ICD-10-CM | POA: Diagnosis not present

## 2019-12-04 DIAGNOSIS — D631 Anemia in chronic kidney disease: Secondary | ICD-10-CM | POA: Diagnosis not present

## 2019-12-04 DIAGNOSIS — N184 Chronic kidney disease, stage 4 (severe): Secondary | ICD-10-CM | POA: Diagnosis not present

## 2019-12-05 DIAGNOSIS — N184 Chronic kidney disease, stage 4 (severe): Secondary | ICD-10-CM | POA: Diagnosis not present

## 2019-12-05 DIAGNOSIS — E1122 Type 2 diabetes mellitus with diabetic chronic kidney disease: Secondary | ICD-10-CM | POA: Diagnosis not present

## 2019-12-05 DIAGNOSIS — G20 Parkinson's disease: Secondary | ICD-10-CM | POA: Diagnosis not present

## 2019-12-05 DIAGNOSIS — E039 Hypothyroidism, unspecified: Secondary | ICD-10-CM | POA: Diagnosis not present

## 2019-12-05 DIAGNOSIS — B9620 Unspecified Escherichia coli [E. coli] as the cause of diseases classified elsewhere: Secondary | ICD-10-CM | POA: Diagnosis not present

## 2019-12-05 DIAGNOSIS — N390 Urinary tract infection, site not specified: Secondary | ICD-10-CM | POA: Diagnosis not present

## 2019-12-05 DIAGNOSIS — I129 Hypertensive chronic kidney disease with stage 1 through stage 4 chronic kidney disease, or unspecified chronic kidney disease: Secondary | ICD-10-CM | POA: Diagnosis not present

## 2019-12-05 DIAGNOSIS — D631 Anemia in chronic kidney disease: Secondary | ICD-10-CM | POA: Diagnosis not present

## 2019-12-05 DIAGNOSIS — E1140 Type 2 diabetes mellitus with diabetic neuropathy, unspecified: Secondary | ICD-10-CM | POA: Diagnosis not present

## 2019-12-09 DIAGNOSIS — G20 Parkinson's disease: Secondary | ICD-10-CM | POA: Diagnosis not present

## 2019-12-09 DIAGNOSIS — D631 Anemia in chronic kidney disease: Secondary | ICD-10-CM | POA: Diagnosis not present

## 2019-12-09 DIAGNOSIS — E039 Hypothyroidism, unspecified: Secondary | ICD-10-CM | POA: Diagnosis not present

## 2019-12-09 DIAGNOSIS — B9620 Unspecified Escherichia coli [E. coli] as the cause of diseases classified elsewhere: Secondary | ICD-10-CM | POA: Diagnosis not present

## 2019-12-09 DIAGNOSIS — E1122 Type 2 diabetes mellitus with diabetic chronic kidney disease: Secondary | ICD-10-CM | POA: Diagnosis not present

## 2019-12-09 DIAGNOSIS — N390 Urinary tract infection, site not specified: Secondary | ICD-10-CM | POA: Diagnosis not present

## 2019-12-09 DIAGNOSIS — I129 Hypertensive chronic kidney disease with stage 1 through stage 4 chronic kidney disease, or unspecified chronic kidney disease: Secondary | ICD-10-CM | POA: Diagnosis not present

## 2019-12-09 DIAGNOSIS — N184 Chronic kidney disease, stage 4 (severe): Secondary | ICD-10-CM | POA: Diagnosis not present

## 2019-12-09 DIAGNOSIS — E1140 Type 2 diabetes mellitus with diabetic neuropathy, unspecified: Secondary | ICD-10-CM | POA: Diagnosis not present

## 2019-12-10 DIAGNOSIS — N184 Chronic kidney disease, stage 4 (severe): Secondary | ICD-10-CM | POA: Diagnosis not present

## 2019-12-10 DIAGNOSIS — D631 Anemia in chronic kidney disease: Secondary | ICD-10-CM | POA: Diagnosis not present

## 2019-12-10 DIAGNOSIS — N390 Urinary tract infection, site not specified: Secondary | ICD-10-CM | POA: Diagnosis not present

## 2019-12-10 DIAGNOSIS — B9620 Unspecified Escherichia coli [E. coli] as the cause of diseases classified elsewhere: Secondary | ICD-10-CM | POA: Diagnosis not present

## 2019-12-10 DIAGNOSIS — G20 Parkinson's disease: Secondary | ICD-10-CM | POA: Diagnosis not present

## 2019-12-10 DIAGNOSIS — E1122 Type 2 diabetes mellitus with diabetic chronic kidney disease: Secondary | ICD-10-CM | POA: Diagnosis not present

## 2019-12-10 DIAGNOSIS — E1140 Type 2 diabetes mellitus with diabetic neuropathy, unspecified: Secondary | ICD-10-CM | POA: Diagnosis not present

## 2019-12-10 DIAGNOSIS — I129 Hypertensive chronic kidney disease with stage 1 through stage 4 chronic kidney disease, or unspecified chronic kidney disease: Secondary | ICD-10-CM | POA: Diagnosis not present

## 2019-12-10 DIAGNOSIS — E039 Hypothyroidism, unspecified: Secondary | ICD-10-CM | POA: Diagnosis not present

## 2019-12-11 DIAGNOSIS — I129 Hypertensive chronic kidney disease with stage 1 through stage 4 chronic kidney disease, or unspecified chronic kidney disease: Secondary | ICD-10-CM | POA: Diagnosis not present

## 2019-12-11 DIAGNOSIS — N390 Urinary tract infection, site not specified: Secondary | ICD-10-CM | POA: Diagnosis not present

## 2019-12-11 DIAGNOSIS — E039 Hypothyroidism, unspecified: Secondary | ICD-10-CM | POA: Diagnosis not present

## 2019-12-11 DIAGNOSIS — E1122 Type 2 diabetes mellitus with diabetic chronic kidney disease: Secondary | ICD-10-CM | POA: Diagnosis not present

## 2019-12-11 DIAGNOSIS — B9620 Unspecified Escherichia coli [E. coli] as the cause of diseases classified elsewhere: Secondary | ICD-10-CM | POA: Diagnosis not present

## 2019-12-11 DIAGNOSIS — N184 Chronic kidney disease, stage 4 (severe): Secondary | ICD-10-CM | POA: Diagnosis not present

## 2019-12-11 DIAGNOSIS — D631 Anemia in chronic kidney disease: Secondary | ICD-10-CM | POA: Diagnosis not present

## 2019-12-11 DIAGNOSIS — G20 Parkinson's disease: Secondary | ICD-10-CM | POA: Diagnosis not present

## 2019-12-11 DIAGNOSIS — E1140 Type 2 diabetes mellitus with diabetic neuropathy, unspecified: Secondary | ICD-10-CM | POA: Diagnosis not present

## 2019-12-12 DIAGNOSIS — D631 Anemia in chronic kidney disease: Secondary | ICD-10-CM | POA: Diagnosis not present

## 2019-12-12 DIAGNOSIS — E039 Hypothyroidism, unspecified: Secondary | ICD-10-CM | POA: Diagnosis not present

## 2019-12-12 DIAGNOSIS — N184 Chronic kidney disease, stage 4 (severe): Secondary | ICD-10-CM | POA: Diagnosis not present

## 2019-12-12 DIAGNOSIS — I129 Hypertensive chronic kidney disease with stage 1 through stage 4 chronic kidney disease, or unspecified chronic kidney disease: Secondary | ICD-10-CM | POA: Diagnosis not present

## 2019-12-12 DIAGNOSIS — B9620 Unspecified Escherichia coli [E. coli] as the cause of diseases classified elsewhere: Secondary | ICD-10-CM | POA: Diagnosis not present

## 2019-12-12 DIAGNOSIS — E1122 Type 2 diabetes mellitus with diabetic chronic kidney disease: Secondary | ICD-10-CM | POA: Diagnosis not present

## 2019-12-12 DIAGNOSIS — N390 Urinary tract infection, site not specified: Secondary | ICD-10-CM | POA: Diagnosis not present

## 2019-12-12 DIAGNOSIS — G20 Parkinson's disease: Secondary | ICD-10-CM | POA: Diagnosis not present

## 2019-12-12 DIAGNOSIS — E1140 Type 2 diabetes mellitus with diabetic neuropathy, unspecified: Secondary | ICD-10-CM | POA: Diagnosis not present

## 2019-12-14 DIAGNOSIS — E1165 Type 2 diabetes mellitus with hyperglycemia: Secondary | ICD-10-CM | POA: Diagnosis not present

## 2019-12-15 ENCOUNTER — Other Ambulatory Visit: Payer: Self-pay

## 2019-12-15 ENCOUNTER — Ambulatory Visit
Admission: RE | Admit: 2019-12-15 | Discharge: 2019-12-15 | Disposition: A | Discharge status: Home / Self Care | Payer: Medicare HMO | Source: Ambulatory Visit | Attending: Nephrology | Admitting: Nephrology

## 2019-12-15 DIAGNOSIS — B9620 Unspecified Escherichia coli [E. coli] as the cause of diseases classified elsewhere: Secondary | ICD-10-CM | POA: Diagnosis not present

## 2019-12-15 DIAGNOSIS — G20 Parkinson's disease: Secondary | ICD-10-CM | POA: Diagnosis not present

## 2019-12-15 DIAGNOSIS — I129 Hypertensive chronic kidney disease with stage 1 through stage 4 chronic kidney disease, or unspecified chronic kidney disease: Secondary | ICD-10-CM | POA: Diagnosis not present

## 2019-12-15 DIAGNOSIS — D631 Anemia in chronic kidney disease: Secondary | ICD-10-CM | POA: Diagnosis not present

## 2019-12-15 DIAGNOSIS — N390 Urinary tract infection, site not specified: Secondary | ICD-10-CM | POA: Diagnosis not present

## 2019-12-15 DIAGNOSIS — E1140 Type 2 diabetes mellitus with diabetic neuropathy, unspecified: Secondary | ICD-10-CM | POA: Diagnosis not present

## 2019-12-15 DIAGNOSIS — N184 Chronic kidney disease, stage 4 (severe): Secondary | ICD-10-CM | POA: Diagnosis not present

## 2019-12-15 DIAGNOSIS — N179 Acute kidney failure, unspecified: Secondary | ICD-10-CM

## 2019-12-15 DIAGNOSIS — E039 Hypothyroidism, unspecified: Secondary | ICD-10-CM | POA: Diagnosis not present

## 2019-12-15 DIAGNOSIS — N281 Cyst of kidney, acquired: Secondary | ICD-10-CM | POA: Diagnosis not present

## 2019-12-15 DIAGNOSIS — E1122 Type 2 diabetes mellitus with diabetic chronic kidney disease: Secondary | ICD-10-CM | POA: Diagnosis not present

## 2019-12-16 ENCOUNTER — Inpatient Hospital Stay: Payer: Medicare HMO

## 2019-12-16 ENCOUNTER — Inpatient Hospital Stay (HOSPITAL_BASED_OUTPATIENT_CLINIC_OR_DEPARTMENT_OTHER): Payer: Medicare HMO | Admitting: Internal Medicine

## 2019-12-16 ENCOUNTER — Inpatient Hospital Stay: Payer: Medicare HMO | Attending: Internal Medicine

## 2019-12-16 ENCOUNTER — Encounter: Payer: Self-pay | Admitting: Internal Medicine

## 2019-12-16 DIAGNOSIS — N1832 Chronic kidney disease, stage 3b: Secondary | ICD-10-CM

## 2019-12-16 DIAGNOSIS — D631 Anemia in chronic kidney disease: Secondary | ICD-10-CM | POA: Diagnosis not present

## 2019-12-16 DIAGNOSIS — I129 Hypertensive chronic kidney disease with stage 1 through stage 4 chronic kidney disease, or unspecified chronic kidney disease: Secondary | ICD-10-CM | POA: Diagnosis not present

## 2019-12-16 DIAGNOSIS — Z79899 Other long term (current) drug therapy: Secondary | ICD-10-CM | POA: Diagnosis not present

## 2019-12-16 LAB — CBC WITH DIFFERENTIAL/PLATELET
Abs Immature Granulocytes: 0.02 10*3/uL (ref 0.00–0.07)
Basophils Absolute: 0 10*3/uL (ref 0.0–0.1)
Basophils Relative: 1 %
Eosinophils Absolute: 0.4 10*3/uL (ref 0.0–0.5)
Eosinophils Relative: 11 %
HCT: 26.9 % — ABNORMAL LOW (ref 36.0–46.0)
Hemoglobin: 8.7 g/dL — ABNORMAL LOW (ref 12.0–15.0)
Immature Granulocytes: 1 %
Lymphocytes Relative: 26 %
Lymphs Abs: 1 10*3/uL (ref 0.7–4.0)
MCH: 28.8 pg (ref 26.0–34.0)
MCHC: 32.3 g/dL (ref 30.0–36.0)
MCV: 89.1 fL (ref 80.0–100.0)
Monocytes Absolute: 0.4 10*3/uL (ref 0.1–1.0)
Monocytes Relative: 9 %
Neutro Abs: 2 10*3/uL (ref 1.7–7.7)
Neutrophils Relative %: 52 %
Platelets: 139 10*3/uL — ABNORMAL LOW (ref 150–400)
RBC: 3.02 MIL/uL — ABNORMAL LOW (ref 3.87–5.11)
RDW: 14.6 % (ref 11.5–15.5)
WBC: 3.9 10*3/uL — ABNORMAL LOW (ref 4.0–10.5)
nRBC: 0 % (ref 0.0–0.2)

## 2019-12-16 LAB — BASIC METABOLIC PANEL
Anion gap: 8 (ref 5–15)
BUN: 26 mg/dL — ABNORMAL HIGH (ref 8–23)
CO2: 26 mmol/L (ref 22–32)
Calcium: 9.6 mg/dL (ref 8.9–10.3)
Chloride: 101 mmol/L (ref 98–111)
Creatinine, Ser: 1.78 mg/dL — ABNORMAL HIGH (ref 0.44–1.00)
GFR, Estimated: 31 mL/min — ABNORMAL LOW (ref >60)
Glucose, Bld: 208 mg/dL — ABNORMAL HIGH (ref 70–99)
Potassium: 4.5 mmol/L (ref 3.5–5.1)
Sodium: 135 mmol/L (ref 135–145)

## 2019-12-16 MED ORDER — EPOETIN ALFA-EPBX 10000 UNIT/ML IJ SOLN
20000.0000 [IU] | Freq: Once | INTRAMUSCULAR | Status: AC
  Administered 2019-12-16: 20000 [IU] via SUBCUTANEOUS
  Filled 2019-12-16: qty 2

## 2019-12-16 NOTE — Addendum Note (Signed)
Addended by: Delice Bison E on: 12/16/2019 02:49 PM   Modules accepted: Orders

## 2019-12-16 NOTE — Assessment & Plan Note (Addendum)
#  Anemia hemoglobin 8-9-chronic; likely secondary to chronic kidney disease [aug 2021-iron saturation 11 ferritin 30s].  Today Hemoglobin-8.7- s/p Venofer. Proceed with retacrit today.   # Mechanical falls/ Excessive sleepiness/ jerking movements of extrmities- s/p evaluation with Dr.Shah; on carbidopa-levodopa. On home PT.   #Chronic kidney disease-stage III-IV-S/p OCT 2021- Kidney US [nephro]medical renal disease Dr.Singh].   # DISPOSITION: # Retacrit today # in 3 month- H&H; possible retacrit # in 6 weeks- H&H- possible retacrit # Follow up in 9  weeks; MD; labs- cbc/bmp;iron studies/ferritin-  Retacrit- Dr.B

## 2019-12-16 NOTE — Progress Notes (Signed)
[facility] NOTE  Patient Care Team: Kirk Ruths, MD as PCP - General (Internal Medicine) Murrell Redden, MD (Unknown Physician Specialty)  CHIEF COMPLAINTS/PURPOSE OF CONSULTATION: Anemia   HEMATOLOGY HISTORY  # ANEMIA hb-8-9; WBC/platelets-N; EGD-August 19, 2019- EGD-WNL [Dr.Wohl]-; colonoscopy-small polyps-[Jan, 2020; Dr.Wohl] ;June 2021-  SPEP/UPEP-NEGATIVE; AUG 2021- CT A/P-NEGATIVE  # CKD- Stage IIIB [GFR-32; Dr.Singh-Oct 2021]; Stroke x 2 [last 2017- ? ? bil LE weakness; [place]]; Rheumatology [Dr.Patel, [place]]; DM [March 2021- A1c-9.6]; neurology;Dr.Shah-? parkinson  Oct 2021- Carbidopa-levodopa  HISTORY OF PRESENTING ILLNESS:  Elizabeth Rivera 69 y.o.  female with anemia/chronic kidney disease is here for follow-up.   In the interim patient patient started on physical therapy for her gait instability.  Is also admitted to the hospital in [place] post a fall; this was attributed to diuresis/orthostasis.  In the interim patient had ultrasound of the kidney  She continues to be fatigued.  She continues to complain of shortness of breath on exertion.  Review of Systems  Constitutional: Positive for malaise/fatigue and weight loss. Negative for chills, diaphoresis and fever.  HENT: Negative for nosebleeds and sore throat.   Eyes: Negative for double vision.  Respiratory: Positive for shortness of breath. Negative for cough, hemoptysis, sputum production and wheezing.   Cardiovascular: Negative for chest pain, palpitations, orthopnea and leg swelling.  Gastrointestinal: Positive for constipation. Negative for abdominal pain, blood in stool, diarrhea, heartburn, melena, nausea and vomiting.  Genitourinary: Negative for dysuria, frequency and urgency.  Musculoskeletal: Positive for back pain and joint pain.  Skin: Negative.  Negative for itching and rash.  Neurological: Negative for dizziness, tingling, focal weakness, weakness and headaches.   Endo/Heme/Allergies: Does not bruise/bleed easily.  Psychiatric/Behavioral: Negative for depression. The patient is not nervous/anxious and does not have insomnia.     MEDICAL HISTORY:  Past Medical History:  Diagnosis Date  . Anemia   . Anemia of other chronic disease 09/01/2013  . Anxiety   . Arthritis    back s/p fracture x3 approx 1983  . Candidal vaginitis   . Cognitive impairment   . Diabetes mellitus without complication ([place])   . Diverticulosis of colon (without mention of hemorrhage) 05/20/2009   Internal hemms (Dr. Vira Agar)  . Forearm fracture    2013, left  . GERD (gastroesophageal reflux disease)    reflux HH 09/01/2002//egd reflux Esoph. HH Gastritis nonbleeding erosive gastropathy Duodentitis 08/15/2006  . Hyperglycemia   . Hyperlipidemia   . Hypertension   . Insomnia, unspecified   . Irritable bowel syndrome    history of  . Nervous breakdown 08/2002   [facility]  . Neuropathy   . Other abnormal blood chemistry   . Other chest pain    chest discomfort  . Other esophagitis    erosive esophagitis  . Other specified disorders of adrenal glands 07/2007   adrenal mass via of CT 1.4cm left Adrenal no change(Dr.Cope)   . Other specified gastritis without mention of hemorrhage    erosvie gastritis  . Parkinsons disease, secondary ([place])   . Stroke ([place]) 03/2014  . Stroke ([place]) 03/2015  . Thyroid disease    hypothyroid, goiter  . Wears dentures    partial upper and lower.    SURGICAL HISTORY: Past Surgical History:  Procedure Laterality Date  . ABDOMINAL HYSTERECTOMY  1995   Part Hyst BSO DUB ovarian cyst B9  . BREAST CYST ASPIRATION Bilateral   . BREAST REDUCTION SURGERY  1984  . CHOLECYSTECTOMY  03/2004  .  COLONOSCOPY  1. 08/15/06  2. 05/20/09   Int Hemms  2. Divertics Int Hemms (Dr. Vira Agar)  . COLONOSCOPY WITH PROPOFOL N/A 03/05/2018   Procedure: COLONOSCOPY WITH PROPOFOL;  Surgeon: Lucilla Lame, MD;  Location: [facility] ENDOSCOPY;  Service: Endoscopy;   Laterality: N/A;  . CT ABD W & PELVIS WO CM  6/09   CT Scan abd stable adrenal adenoma 1.4cm left adrenal no change (Dr. Jacqlyn Larsen)  . CYSTOSCOPY  01/08/2008   Former site of inflammation healed (Dr. Jacqlyn Larsen)  . ESOPHAGOGASTRODUODENOSCOPY  1. 09/01/02  2. 08/15/06   1. Reflux, HH  2. Reflux esoph HH gaastritis nonbleeding erosive gastropathy duodenitis  . ESOPHAGOGASTRODUODENOSCOPY (EGD) WITH PROPOFOL N/A 06/01/2016   Procedure: ESOPHAGOGASTRODUODENOSCOPY (EGD) WITH PROPOFOL;  Surgeon: Lucilla Lame, MD;  Location: [facility];  Service: Endoscopy;  Laterality: N/A;  diabetic - insulin and oral meds  . ESOPHAGOGASTRODUODENOSCOPY (EGD) WITH PROPOFOL N/A 08/19/2019   Procedure: ESOPHAGOGASTRODUODENOSCOPY (EGD) WITH PROPOFOL;  Surgeon: Lucilla Lame, MD;  Location: [facility] ENDOSCOPY;  Service: Endoscopy;  Laterality: N/A;  . NSVD x1  1977  . REDUCTION MAMMAPLASTY  1985  . VAGINAL DELIVERY  1977    SOCIAL HISTORY: Social History   Socioeconomic History  . Marital status: Married    Spouse name: Not on file  . Number of children: Not on file  . Years of education: Not on file  . Highest education level: Not on file  Occupational History  . Not on file  Tobacco Use  . Smoking status: Never Smoker  . Smokeless tobacco: Never Used  Vaping Use  . Vaping Use: Never used  Substance and Sexual Activity  . Alcohol use: No    Alcohol/week: 0.0 standard drinks  . Drug use: No  . Sexual activity: Never  Other Topics Concern  . Not on file  Social History Narrative   No regular exercise.   Worked at [employer] in [place]   Her elderly mother had a CVA and moved in with patient   Social Determinants of Health   Financial Resource Strain:   . Difficulty of Paying Living Expenses: Not on file  Food Insecurity:   . Worried About Charity fundraiser in the Last Year: Not on file  . Ran Out of Food in the Last Year: Not on file  Transportation Needs:   . Lack of Transportation (Medical): Not on  file  . Lack of Transportation (Non-Medical): Not on file  Physical Activity:   . Days of Exercise per Week: Not on file  . Minutes of Exercise per Session: Not on file  Stress:   . Feeling of Stress : Not on file  Social Connections:   . Frequency of Communication with Friends and Family: Not on file  . Frequency of Social Gatherings with Friends and Family: Not on file  . Attends Religious Services: Not on file  . Active Member of Clubs or Organizations: Not on file  . Attends Archivist Meetings: Not on file  . Marital Status: Not on file  Intimate Partner Violence:   . Fear of Current or Ex-Partner: Not on file  . Emotionally Abused: Not on file  . Physically Abused: Not on file  . Sexually Abused: Not on file    FAMILY HISTORY: Family History  Problem Relation Age of Onset  . Hypertension Mother   . Hyperlipidemia Mother   . Stroke Mother        17  . Vision loss Father  vision problems  . Cancer Father        Colon   . Hyperlipidemia Sister   . Hypertension Sister   . Vision loss Sister   . Breast cancer Maternal Grandmother 48  . Breast cancer Cousin        paternal cousin  . Prostate cancer Paternal Grandfather   . Bladder Cancer Neg Hx   . Kidney cancer Neg Hx     ALLERGIES:  is allergic to sulfa antibiotics.  MEDICATIONS:  Current Outpatient Medications  Medication Sig Dispense Refill  . ALPRAZolam (XANAX) 0.25 MG tablet Take 1 tablet (0.25 mg total) by mouth 2 (two) times daily. 30 tablet 0  . amLODipine (NORVASC) 5 MG tablet Take by mouth.    Marland Kitchen atorvastatin (LIPITOR) 40 MG tablet Take 1 tablet (40 mg total) by mouth daily at 6 PM. 90 tablet 3  . bisacodyl (DULCOLAX) 10 MG suppository Place 10 mg rectally as needed for moderate constipation.     . Blood Glucose Calibration (LIBERTY GLUCOSE CONTROL) Normal LIQD Use 1 each as directed    . carbidopa-levodopa (SINEMET IR) 25-100 MG tablet Take 1 tablet by mouth 3 (three) times daily.      . carvedilol (COREG) 12.5 MG tablet carvedilol 12.5 mg tablet    . cholecalciferol (VITAMIN D) 1000 units tablet Take 1,000 Units by mouth daily.    . clopidogrel (PLAVIX) 75 MG tablet Take 1 tablet (75 mg total) by mouth daily. 90 tablet 1  . Continuous Blood Gluc Receiver (FREESTYLE LIBRE 2 READER SYSTM) DEVI Use 1 Device as directed    . Continuous Blood Gluc Sensor (FREESTYLE LIBRE 2 SENSOR SYSTM) MISC Use 1 kit every 14 (fourteen) days for glucose monitoring    . CRANBERRY PO Take 1 capsule by mouth daily.     . DROPLET PEN NEEDLES 31G X 5 MM MISC     . famotidine (PEPCID) 20 MG tablet TAKE 1 TABLET EVERY DAY 90 tablet 1  . ferrous sulfate ([employer] IRON) 325 (65 FE) MG tablet Take 1 tablet (325 mg total) by mouth daily with breakfast.    . gabapentin (NEURONTIN) 100 MG capsule Take 200 mg by mouth 3 (three) times daily.     Marland Kitchen glimepiride (AMARYL) 4 MG tablet glimepiride 4 mg tablet    . glucose blood (PRECISION QID TEST) test strip 1 each (1 strip total) by XX route 3 (three) times daily Use as instructed.    . insulin aspart (NOVOLOG) 100 UNIT/ML injection Inject 5 Units into the skin 3 (three) times daily before meals.    . insulin glargine (LANTUS) 100 UNIT/ML injection Inject into the skin at bedtime. Per sliding scal    . iron polysaccharides (NIFEREX) 150 MG capsule Take by mouth.    . levothyroxine (SYNTHROID) 125 MCG tablet Take 125 mcg by mouth daily before breakfast.    . Melatonin 5 MG TABS Take 5 mg by mouth at bedtime.    . metFORMIN (GLUCOPHAGE) 500 MG tablet Take 500 mg by mouth 2 (two) times daily with a meal. Twice a day    . mirabegron ER (MYRBETRIQ) 25 MG TB24 tablet Take 1 tablet (25 mg total) by mouth daily. 30 tablet 11  . mirtazapine (REMERON) 7.5 MG tablet Take 7.5 mg by mouth at bedtime.     Marland Kitchen omeprazole (PRILOSEC) 40 MG capsule Take by mouth.    . pantoprazole (PROTONIX) 40 MG tablet Take 1 tablet (40 mg total) by mouth 2 (two) times daily.  180 tablet 1  . torsemide  (DEMADEX) 10 MG tablet     . venlafaxine XR (EFFEXOR-XR) 150 MG 24 hr capsule Take 1 capsule (150 mg total) by mouth 2 (two) times daily. 180 capsule 3  . albuterol (VENTOLIN HFA) 108 (90 Base) MCG/ACT inhaler Inhale 2 puffs into the lungs every 6 (six) hours as needed for wheezing or shortness of breath. (Patient not taking: Reported on 11/18/2019) 8 g 0   No current facility-administered medications for this visit.      PHYSICAL EXAMINATION:   Vitals:   12/16/19 1012  BP: 106/66  Pulse: 70  Resp: 16  Temp: (!) 96.3 F (35.7 C)  SpO2: 99%   Filed Weights   12/16/19 1012  Weight: 108 lb (49 kg)    Physical Exam Constitutional:      Comments: Frail-appearing Caucasian female patient.  In a wheelchair.  Accompanied by family.  HENT:     Head: Normocephalic and atraumatic.     Mouth/Throat:     Pharynx: No oropharyngeal exudate.  Eyes:     Pupils: Pupils are equal, round, and reactive to light.  Cardiovascular:     Rate and Rhythm: Normal rate and regular rhythm.  Pulmonary:     Effort: No respiratory distress.     Breath sounds: No wheezing.  Abdominal:     General: Bowel sounds are normal. There is no distension.     Palpations: Abdomen is soft. There is no mass.     Tenderness: There is no abdominal tenderness. There is no guarding or rebound.  Musculoskeletal:        General: No tenderness. Normal range of motion.     Cervical back: Normal range of motion and neck supple.  Skin:    General: Skin is warm.  Neurological:     Mental Status: She is alert and oriented to person, place, and time.     Comments: Chronic right sided weakness/previous stroke  Psychiatric:        Mood and Affect: Affect normal.     LABORATORY DATA:  I have reviewed the data as listed Lab Results  Component Value Date   WBC 3.9 (L) 12/16/2019   HGB 8.7 (L) 12/16/2019   HCT 26.9 (L) 12/16/2019   MCV 89.1 12/16/2019   PLT 139 (L) 12/16/2019   Recent Labs    08/20/19 1300  08/20/19 1300 09/08/19 1037 09/08/19 1037 10/20/19 0959 11/18/19 0935 12/16/19 0943  NA 137   < > 138   < > 139 140 135  K 3.5   < > 3.4*   < > 3.4* 4.2 4.5  CL 96*   < > 98   < > 101 100 101  CO2 32   < > 29   < > 27 30 26   GLUCOSE 84   < > 221*   < > 183* 146* 208*  BUN 39*   < > 35*   < > 41* 63* 26*  CREATININE 2.02*   < > 1.79*   < > 1.65* 2.21* 1.78*  CALCIUM 9.2   < > 9.4   < > 9.3 9.7 9.6  GFRNONAA 25*   < > 29*   < > 32* 22* 31*  GFRAA 29*  --  33*  --  37*  --   --   PROT 7.5  --   --   --   --   --   --   ALBUMIN 3.7  --   --   --   --   --   --  AST 18  --   --   --   --   --   --   ALT 16  --   --   --   --   --   --   ALKPHOS 57  --   --   --   --   --   --   BILITOT 0.5  --   --   --   --   --   --    < > = values in this interval not displayed.     US RENAL  Result Date: 12/16/2019 CLINICAL DATA:  Initial evaluation for acute kidney injury. EXAM: RENAL / URINARY TRACT ULTRASOUND COMPLETE COMPARISON:  Prior ultrasound from 03/25/2019. FINDINGS: Right Kidney: Renal measurements: 9.4 x 4.7 x 4.6 cm = volume: 104.9 mL. Increased echogenicity seen within the renal parenchyma. No nephrolithiasis or hydronephrosis. 3.2 x 2.2 x 2.3 cm simple cyst present at the lower pole. Left Kidney: Renal measurements: 9.1 x 5.1 x 4.5 cm = volume: 108.6 mL. Increased echogenicity within the renal parenchyma. No nephrolithiasis or hydronephrosis. 0.8 x 0.5 x 0.6 cm simple cyst present at the lower pole. Bladder: Mild bladder wall irregularity likely related incomplete distension. Bladder otherwise unremarkable. Other: None. IMPRESSION: 1. Increased echogenicity within the renal parenchyma, compatible with medical renal disease. 2. No hydronephrosis. 3. Simple bilateral renal cysts as above. Electronically Signed   By: Jeannine Boga M.D.   On: 12/16/2019 00:14    Anemia due to stage 3b chronic kidney disease #Anemia hemoglobin 8-9-chronic; likely secondary to chronic kidney disease  [aug 2021-iron saturation 11 ferritin 30s].  Today Hemoglobin-8.7- s/p Venofer. Proceed with retacrit today.   # Mechanical falls/ Excessive sleepiness/ jerking movements of extrmities- s/p evaluation with Dr.Shah; on carbidopa-levodopa. On home PT.   #Chronic kidney disease-stage III-IV-S/p OCT 2021- Kidney US [nephro]medical renal disease Dr.Singh].   # DISPOSITION: # Retacrit today # in 3 month- H&H; possible retacrit # in 6 weeks- H&H- possible retacrit # Follow up in 9  weeks; MD; labs- cbc/bmp;iron studies/ferritin-  Retacrit- Dr.B  All questions were answered. The patient knows to call the clinic with any problems, questions or concerns.   Cammie Sickle, MD 12/16/2019 12:01 PM

## 2019-12-17 DIAGNOSIS — E1140 Type 2 diabetes mellitus with diabetic neuropathy, unspecified: Secondary | ICD-10-CM | POA: Diagnosis not present

## 2019-12-17 DIAGNOSIS — D631 Anemia in chronic kidney disease: Secondary | ICD-10-CM | POA: Diagnosis not present

## 2019-12-17 DIAGNOSIS — B9620 Unspecified Escherichia coli [E. coli] as the cause of diseases classified elsewhere: Secondary | ICD-10-CM | POA: Diagnosis not present

## 2019-12-17 DIAGNOSIS — N184 Chronic kidney disease, stage 4 (severe): Secondary | ICD-10-CM | POA: Diagnosis not present

## 2019-12-17 DIAGNOSIS — I129 Hypertensive chronic kidney disease with stage 1 through stage 4 chronic kidney disease, or unspecified chronic kidney disease: Secondary | ICD-10-CM | POA: Diagnosis not present

## 2019-12-17 DIAGNOSIS — G20 Parkinson's disease: Secondary | ICD-10-CM | POA: Diagnosis not present

## 2019-12-17 DIAGNOSIS — N390 Urinary tract infection, site not specified: Secondary | ICD-10-CM | POA: Diagnosis not present

## 2019-12-17 DIAGNOSIS — E1122 Type 2 diabetes mellitus with diabetic chronic kidney disease: Secondary | ICD-10-CM | POA: Diagnosis not present

## 2019-12-17 DIAGNOSIS — E039 Hypothyroidism, unspecified: Secondary | ICD-10-CM | POA: Diagnosis not present

## 2019-12-18 DIAGNOSIS — N184 Chronic kidney disease, stage 4 (severe): Secondary | ICD-10-CM | POA: Diagnosis not present

## 2019-12-18 DIAGNOSIS — B9620 Unspecified Escherichia coli [E. coli] as the cause of diseases classified elsewhere: Secondary | ICD-10-CM | POA: Diagnosis not present

## 2019-12-18 DIAGNOSIS — N390 Urinary tract infection, site not specified: Secondary | ICD-10-CM | POA: Diagnosis not present

## 2019-12-18 DIAGNOSIS — G20 Parkinson's disease: Secondary | ICD-10-CM | POA: Diagnosis not present

## 2019-12-18 DIAGNOSIS — E1140 Type 2 diabetes mellitus with diabetic neuropathy, unspecified: Secondary | ICD-10-CM | POA: Diagnosis not present

## 2019-12-18 DIAGNOSIS — I129 Hypertensive chronic kidney disease with stage 1 through stage 4 chronic kidney disease, or unspecified chronic kidney disease: Secondary | ICD-10-CM | POA: Diagnosis not present

## 2019-12-18 DIAGNOSIS — D631 Anemia in chronic kidney disease: Secondary | ICD-10-CM | POA: Diagnosis not present

## 2019-12-18 DIAGNOSIS — E039 Hypothyroidism, unspecified: Secondary | ICD-10-CM | POA: Diagnosis not present

## 2019-12-18 DIAGNOSIS — E1122 Type 2 diabetes mellitus with diabetic chronic kidney disease: Secondary | ICD-10-CM | POA: Diagnosis not present

## 2019-12-24 DIAGNOSIS — G20 Parkinson's disease: Secondary | ICD-10-CM | POA: Diagnosis not present

## 2019-12-24 DIAGNOSIS — N184 Chronic kidney disease, stage 4 (severe): Secondary | ICD-10-CM | POA: Diagnosis not present

## 2019-12-24 DIAGNOSIS — N390 Urinary tract infection, site not specified: Secondary | ICD-10-CM | POA: Diagnosis not present

## 2019-12-24 DIAGNOSIS — B9620 Unspecified Escherichia coli [E. coli] as the cause of diseases classified elsewhere: Secondary | ICD-10-CM | POA: Diagnosis not present

## 2019-12-24 DIAGNOSIS — E1140 Type 2 diabetes mellitus with diabetic neuropathy, unspecified: Secondary | ICD-10-CM | POA: Diagnosis not present

## 2019-12-24 DIAGNOSIS — E1122 Type 2 diabetes mellitus with diabetic chronic kidney disease: Secondary | ICD-10-CM | POA: Diagnosis not present

## 2019-12-24 DIAGNOSIS — D631 Anemia in chronic kidney disease: Secondary | ICD-10-CM | POA: Diagnosis not present

## 2019-12-24 DIAGNOSIS — I129 Hypertensive chronic kidney disease with stage 1 through stage 4 chronic kidney disease, or unspecified chronic kidney disease: Secondary | ICD-10-CM | POA: Diagnosis not present

## 2019-12-24 DIAGNOSIS — E039 Hypothyroidism, unspecified: Secondary | ICD-10-CM | POA: Diagnosis not present

## 2019-12-25 DIAGNOSIS — G4733 Obstructive sleep apnea (adult) (pediatric): Secondary | ICD-10-CM | POA: Diagnosis not present

## 2019-12-26 ENCOUNTER — Ambulatory Visit (INDEPENDENT_AMBULATORY_CARE_PROVIDER_SITE_OTHER): Payer: Medicare HMO | Admitting: Urology

## 2019-12-26 ENCOUNTER — Other Ambulatory Visit: Payer: Self-pay

## 2019-12-26 ENCOUNTER — Telehealth: Payer: Self-pay | Admitting: Urology

## 2019-12-26 ENCOUNTER — Encounter: Payer: Self-pay | Admitting: Urology

## 2019-12-26 VITALS — BP 151/82 | HR 75 | Ht 62.0 in | Wt 108.0 lb

## 2019-12-26 DIAGNOSIS — N3090 Cystitis, unspecified without hematuria: Secondary | ICD-10-CM

## 2019-12-26 DIAGNOSIS — N952 Postmenopausal atrophic vaginitis: Secondary | ICD-10-CM | POA: Diagnosis not present

## 2019-12-26 DIAGNOSIS — R21 Rash and other nonspecific skin eruption: Secondary | ICD-10-CM

## 2019-12-26 DIAGNOSIS — N3941 Urge incontinence: Secondary | ICD-10-CM

## 2019-12-26 LAB — MICROSCOPIC EXAMINATION

## 2019-12-26 LAB — URINALYSIS, COMPLETE
Bilirubin, UA: NEGATIVE
Glucose, UA: NEGATIVE
Ketones, UA: NEGATIVE
Nitrite, UA: NEGATIVE
Protein,UA: NEGATIVE
RBC, UA: NEGATIVE
Specific Gravity, UA: 1.015 (ref 1.005–1.030)
Urobilinogen, Ur: 0.2 mg/dL (ref 0.2–1.0)
pH, UA: 7 (ref 5.0–7.5)

## 2019-12-26 LAB — BLADDER SCAN AMB NON-IMAGING: Scan Result: 76

## 2019-12-26 MED ORDER — PREMARIN 0.625 MG/GM VA CREA: TOPICAL_CREAM | VAGINAL | 12 refills | Status: AC

## 2019-12-26 MED ORDER — CEFUROXIME AXETIL 250 MG PO TABS: 250.0000 mg | ORAL_TABLET | Freq: Two times a day (BID) | ORAL | 0 refills | Status: DC

## 2019-12-26 MED ORDER — NYSTATIN 100000 UNIT/GM EX CREA: 1.0000 "application " | TOPICAL_CREAM | Freq: Two times a day (BID) | CUTANEOUS | 0 refills | Status: AC

## 2019-12-26 NOTE — Telephone Encounter (Signed)
She is interested in PTNS treatments.  Would you check to see if her insurance covers it?

## 2019-12-26 NOTE — Patient Instructions (Signed)
  Using your finger-tip apply a pea-sized amount of the Premarin cream to the external vaginal area on Monday, Wednesday and Friday nights  I have also sent a prescription in to your pharmacy.

## 2019-12-26 NOTE — Progress Notes (Signed)
12/26/2019 11:32 AM   [name] 1950-11-15 [id number]  Referring provider: Kirk Ruths, MD [facility] [place],  [place] [address]  Chief Complaint  Patient presents with  . Follow-up    HPI: Elizabeth Rivera is a 69 y.o. female with urological PHM of incomplete bladder emptying, recurrent urinary tract infections, OAB and nocturnal enuresis who presents today for possible UTI and incontinence.    Today, she is experiencing frequency, urgency, burning/painful urination, leakage of urine, difficulty urinating, lower abdominal pain, night sweats and constipation.  She states the first part of the week, she noticed a rash with burning on her mons pubis and perineal area.   She applied neosporin on the area and it improved, but then it started to worsen.  Yesterday, her vaginal area felt like someone struck a match to it.  Her vaginal area is also burning after she wipes.  She also has burning all the way back to her rectal area.  She states she is saturating 2 pads at night.    She has not been sleeping with her CPAP machine for the last month due to hospitalization resulting with a fall.    She is scheduled later on today for a class concerning how to use the insulin pump.  Recent CT and renal ultrasounds did not identify any hydronephrosis or nephrolithiasis.  UA is positive for many bacteria.  Her PVR 76 mL.     PMH: Past Medical History:  Diagnosis Date  . Anemia   . Anemia of other chronic disease 09/01/2013  . Anxiety   . Arthritis    back s/p fracture x3 approx 1983  . Candidal vaginitis   . Cognitive impairment   . Diabetes mellitus without complication ([place])   . Diverticulosis of colon (without mention of hemorrhage) 05/20/2009   Internal hemms (Dr. Vira Agar)  . Forearm fracture    2013, left  . GERD (gastroesophageal reflux disease)    reflux HH 09/01/2002//egd reflux Esoph. HH Gastritis nonbleeding erosive  gastropathy Duodentitis 08/15/2006  . Hyperglycemia   . Hyperlipidemia   . Hypertension   . Insomnia, unspecified   . Irritable bowel syndrome    history of  . Nervous breakdown 08/2002   [facility]  . Neuropathy   . Other abnormal blood chemistry   . Other chest pain    chest discomfort  . Other esophagitis    erosive esophagitis  . Other specified disorders of adrenal glands 07/2007   adrenal mass via of CT 1.4cm left Adrenal no change(Dr.Cope)   . Other specified gastritis without mention of hemorrhage    erosvie gastritis  . Parkinsons disease, secondary ([place])   . Stroke ([place]) 03/2014  . Stroke ([place]) 03/2015  . Thyroid disease    hypothyroid, goiter  . Wears dentures    partial upper and lower.    Surgical History: Past Surgical History:  Procedure Laterality Date  . ABDOMINAL HYSTERECTOMY  1995   Part Hyst BSO DUB ovarian cyst B9  . BREAST CYST ASPIRATION Bilateral   . BREAST REDUCTION SURGERY  1984  . CHOLECYSTECTOMY  03/2004  . COLONOSCOPY  1. 08/15/06  2. 05/20/09   Int Hemms  2. Divertics Int Hemms (Dr. Vira Agar)  . COLONOSCOPY WITH PROPOFOL N/A 03/05/2018   Procedure: COLONOSCOPY WITH PROPOFOL;  Surgeon: Lucilla Lame, MD;  Location: [facility] ENDOSCOPY;  Service: Endoscopy;  Laterality: N/A;  . CT ABD W & PELVIS WO CM  6/09  CT Scan abd stable adrenal adenoma 1.4cm left adrenal no change (Dr. Jacqlyn Larsen)  . CYSTOSCOPY  01/08/2008   Former site of inflammation healed (Dr. Jacqlyn Larsen)  . ESOPHAGOGASTRODUODENOSCOPY  1. 09/01/02  2. 08/15/06   1. Reflux, HH  2. Reflux esoph HH gaastritis nonbleeding erosive gastropathy duodenitis  . ESOPHAGOGASTRODUODENOSCOPY (EGD) WITH PROPOFOL N/A 06/01/2016   Procedure: ESOPHAGOGASTRODUODENOSCOPY (EGD) WITH PROPOFOL;  Surgeon: Lucilla Lame, MD;  Location: [place];  Service: Endoscopy;  Laterality: N/A;  diabetic - insulin and oral meds  . ESOPHAGOGASTRODUODENOSCOPY (EGD) WITH PROPOFOL N/A 08/19/2019   Procedure: ESOPHAGOGASTRODUODENOSCOPY  (EGD) WITH PROPOFOL;  Surgeon: Lucilla Lame, MD;  Location: [facility] ENDOSCOPY;  Service: Endoscopy;  Laterality: N/A;  . NSVD x1  1977  . REDUCTION MAMMAPLASTY  1985  . VAGINAL DELIVERY  1977    Home Medications:  Allergies as of 12/26/2019      Reactions   Sulfa Antibiotics Rash, Anaphylaxis   And angioedmea      Medication List       Accurate as of December 26, 2019 11:32 AM. If you have any questions, ask your nurse or doctor.        albuterol 108 (90 Base) MCG/ACT inhaler Commonly known as: VENTOLIN HFA Inhale 2 puffs into the lungs every 6 (six) hours as needed for wheezing or shortness of breath.   ALPRAZolam 0.25 MG tablet Commonly known as: XANAX Take 1 tablet (0.25 mg total) by mouth 2 (two) times daily.   amLODipine 5 MG tablet Commonly known as: NORVASC Take by mouth.   atorvastatin 40 MG tablet Commonly known as: LIPITOR Take 1 tablet (40 mg total) by mouth daily at 6 PM.   carbidopa-levodopa 25-100 MG tablet Commonly known as: SINEMET IR Take 1 tablet by mouth 3 (three) times daily.   carvedilol 12.5 MG tablet Commonly known as: COREG carvedilol 12.5 mg tablet   cefUROXime 250 MG tablet Commonly known as: CEFTIN Take 1 tablet (250 mg total) by mouth 2 (two) times daily with a meal. Started by: Zara Council, PA-C   cholecalciferol 1000 units tablet Commonly known as: VITAMIN D Take 1,000 Units by mouth daily.   clopidogrel 75 MG tablet Commonly known as: PLAVIX Take 1 tablet (75 mg total) by mouth daily.   CRANBERRY PO Take 1 capsule by mouth daily.   Droplet Pen Needles 31G X 5 MM Misc Generic drug: Insulin Pen Needle   Dulcolax 10 MG suppository Generic drug: bisacodyl Place 10 mg rectally as needed for moderate constipation.   famotidine 20 MG tablet Commonly known as: PEPCID TAKE 1 TABLET EVERY DAY   ferrous sulfate 325 (65 FE) MG tablet Commonly known as: [employer] Iron Take 1 tablet (325 mg total) by mouth daily with breakfast.     FreeStyle Libre 2 Reader [employer] Use 1 Device as directed   FreeStyle Libre 2 Sensor Systm Misc Use 1 kit every 14 (fourteen) days for glucose monitoring   gabapentin 100 MG capsule Commonly known as: NEURONTIN Take 200 mg by mouth 3 (three) times daily.   glimepiride 4 MG tablet Commonly known as: AMARYL glimepiride 4 mg tablet   insulin aspart 100 UNIT/ML injection Commonly known as: novoLOG Inject 5 Units into the skin 3 (three) times daily before meals.   insulin glargine 100 UNIT/ML injection Commonly known as: LANTUS Inject into the skin at bedtime. Per sliding scal   iron polysaccharides 150 MG capsule Commonly known as: NIFEREX Take by mouth.   levothyroxine 125 MCG tablet  Commonly known as: SYNTHROID Take 125 mcg by mouth daily before breakfast.   [place] Glucose Control Normal Liqd Use 1 each as directed   melatonin 5 MG Tabs Take 5 mg by mouth at bedtime.   metFORMIN 500 MG tablet Commonly known as: GLUCOPHAGE Take 500 mg by mouth 2 (two) times daily with a meal. Twice a day   mirabegron ER 25 MG Tb24 tablet Commonly known as: MYRBETRIQ Take 1 tablet (25 mg total) by mouth daily.   mirtazapine 7.5 MG tablet Commonly known as: REMERON Take 7.5 mg by mouth at bedtime.   omeprazole 40 MG capsule Commonly known as: PRILOSEC Take by mouth.   pantoprazole 40 MG tablet Commonly known as: PROTONIX Take 1 tablet (40 mg total) by mouth 2 (two) times daily.   Precision QID Test test strip Generic drug: glucose blood 1 each (1 strip total) by XX route 3 (three) times daily Use as instructed.   Premarin vaginal cream Generic drug: conjugated estrogens Apply 0.33m (pea-sized amount)  just inside the vaginal introitus with a finger-tip on  Monday, Wednesday and Friday nights. Started by: SZara Council PA-C   torsemide 10 MG tablet Commonly known as: DEMADEX   venlafaxine XR 150 MG 24 hr capsule Commonly known as: EFFEXOR-XR Take 1 capsule (150  mg total) by mouth 2 (two) times daily.       Allergies:  Allergies  Allergen Reactions  . Sulfa Antibiotics Rash and Anaphylaxis    And angioedmea    Family History: Family History  Problem Relation Age of Onset  . Hypertension Mother   . Hyperlipidemia Mother   . Stroke Mother        244 . Vision loss Father        vision problems  . Cancer Father        Colon   . Hyperlipidemia Sister   . Hypertension Sister   . Vision loss Sister   . Breast cancer Maternal Grandmother 445 . Breast cancer Cousin        paternal cousin  . Prostate cancer Paternal Grandfather   . Bladder Cancer Neg Hx   . Kidney cancer Neg Hx     Social History:  reports that she has never smoked. She has never used smokeless tobacco. She reports that she does not drink alcohol and does not use drugs.  ROS: Pertinent ROS in HPI  Physical Exam: BP (!) 151/82   Pulse 75   Ht 5' 2"  (1.575 m)   Wt 108 lb (49 kg)   BMI 19.75 kg/m   Constitutional:  Well nourished. Alert and oriented, No acute distress. HEENT: [place] AT, mask in place.  Trachea midline Cardiovascular: No clubbing, cyanosis, or edema. Respiratory: Normal respiratory effort, no increased work of breathing. GU: Desitin cream on the suprapubic region and inguinal folds, but no rashes present on today's exam.  No CVA tenderness. No bladder fullness or masses.  Bladder is tender on palpation.  Atrophic external genitalia, spase pubic hair distribution, no lesions.  Normal urethral meatus, no lesions, no prolapse, no discharge.   No urethral masses, tenderness and/or tenderness.   No bladder fullness, tenderness or masses. Pale vagina mucosa, poor estrogen effect, no discharge, no lesions, fair pelvic support, grade I cystocele and no rectocele noted.   Anus and perineum are without rashes or lesions.    Neurologic: Grossly intact, no focal deficits, moving all 4 extremities. Psychiatric: Normal mood and affect.   Laboratory Data: Lab Results  Component Value Date   WBC 3.9 (L) 12/16/2019   HGB 8.7 (L) 12/16/2019   HCT 26.9 (L) 12/16/2019   MCV 89.1 12/16/2019   PLT 139 (L) 12/16/2019    Lab Results  Component Value Date   CREATININE 1.78 (H) 12/16/2019   Specimen:  Blood  Ref Range & Units 1 mo ago  Hemoglobin A1C 4.8 - 5.6 % 7.9High      Estimated Average Glucose mg/dL 180      Resulting Agency  [facility]  Narrative Performed by [facility] Screening or Diagnosis of Diabetes Mellitus*  A1c Reference Interval    Interpretation  4.8 - 5.6           Normal  5.7 - 6.4           Dysglycemia  >6.4             Diabetes Mellitus     Lab Results  Component Value Date   AST 18 08/20/2019   Lab Results  Component Value Date   ALT 16 08/20/2019    Urinalysis Component     Latest Ref Rng & Units 12/26/2019  Specific Gravity, UA     1.005 - 1.030 1.015  pH, UA     5.0 - 7.5 7.0  Color, UA     Yellow Yellow  Appearance Ur     Clear Cloudy (A)  Leukocytes,UA     Negative Trace (A)  Protein,UA     Negative/Trace Negative  Glucose, UA     Negative Negative  Ketones, UA     Negative Negative  RBC, UA     Negative Negative  Bilirubin, UA     Negative Negative  Urobilinogen, Ur     0.2 - 1.0 mg/dL 0.2  Nitrite, UA     Negative Negative  Microscopic Examination      See below:   Component     Latest Ref Rng & Units 12/26/2019  WBC, UA     0 - 5 /hpf 6-10 (A)  RBC     0 - 2 /hpf 0-2  Epithelial Cells (non renal)     0 - 10 /hpf 0-10  Bacteria, UA     None seen/Few Many (A)    I have reviewed the labs.   Pertinent Imaging: CLINICAL DATA:  Unintended weight loss.  EXAM: CT ABDOMEN AND PELVIS WITHOUT CONTRAST  TECHNIQUE: Multidetector CT imaging of the abdomen and pelvis was performed following the standard protocol without IV contrast.  COMPARISON:  03/11/2015  FINDINGS: Lower chest:  Unremarkable.  Hepatobiliary: No focal abnormality in the liver on this study without intravenous contrast. Gallbladder is surgically absent. No intrahepatic or extrahepatic biliary dilation.  Pancreas: No focal mass lesion. No dilatation of the main duct. No intraparenchymal cyst. No peripancreatic edema.  Spleen: No splenomegaly. No focal mass lesion.  Adrenals/Urinary Tract: No adrenal nodule or mass. 2.3 cm water density lesion posterior right kidney has increased from 1.4 cm previously and remains compatible with a cyst. Tiny nonobstructing stone noted upper pole left kidney. No evidence for hydroureter. The urinary bladder appears normal for the degree of distention.  Stomach/Bowel: Stomach is unremarkable. No gastric wall thickening. No evidence of outlet obstruction. Duodenum is normally positioned as is the ligament of Treitz. No small bowel wall thickening. No small bowel dilatation. The terminal ileum is normal. The appendix is normal. No gross colonic mass. No colonic wall thickening.  Vascular/Lymphatic: There is abdominal aortic  atherosclerosis without aneurysm. There is no gastrohepatic or hepatoduodenal ligament lymphadenopathy. No retroperitoneal or mesenteric lymphadenopathy. No pelvic sidewall lymphadenopathy.  Reproductive: The uterus is surgically absent. There is no adnexal mass.  Other: No intraperitoneal free fluid.  Musculoskeletal: No worrisome lytic or sclerotic osseous abnormality. Superior endplate compression deformity noted at T12 and L1.  IMPRESSION: 1. No acute findings in the abdomen or pelvis. Specifically, no findings to explain the patient's history of weight loss. 2. Tiny nonobstructing left renal stone. 3. Right renal cyst. 4. Aortic Atherosclerosis (ICD10-I70.0).   Electronically Signed   By: Misty Stanley M.D.   On: 09/15/2019 12:41  CLINICAL DATA:  Initial evaluation for acute kidney injury.  EXAM: RENAL /  URINARY TRACT ULTRASOUND COMPLETE  COMPARISON:  Prior ultrasound from 03/25/2019.  FINDINGS: Right Kidney:  Renal measurements: 9.4 x 4.7 x 4.6 cm = volume: 104.9 mL. Increased echogenicity seen within the renal parenchyma. No nephrolithiasis or hydronephrosis. 3.2 x 2.2 x 2.3 cm simple cyst present at the lower pole.  Left Kidney:  Renal measurements: 9.1 x 5.1 x 4.5 cm = volume: 108.6 mL. Increased echogenicity within the renal parenchyma. No nephrolithiasis or hydronephrosis. 0.8 x 0.5 x 0.6 cm simple cyst present at the lower pole.  Bladder:  Mild bladder wall irregularity likely related incomplete distension. Bladder otherwise unremarkable.  Other:  None.  IMPRESSION: 1. Increased echogenicity within the renal parenchyma, compatible with medical renal disease. 2. No hydronephrosis. 3. Simple bilateral renal cysts as above.   Electronically Signed   By: Jeannine Boga M.D.   On: 12/16/2019 00:14  Results for Elizabeth Rivera, Elizabeth "PAM" (MRN [id number]) as of 12/26/2019 10:53  Ref. Range 12/26/2019 10:48  Scan Result Unknown 76 ml   I have independently reviewed the films.    Assessment & Plan:   1. Cystitis UA with many bacteria Patient does have a history of being colonized, but with tender bladder on exam we will go ahead and prescribe Ceftin 250 mg twice daily while awaiting urine culture results - Urinalysis, Complete - CULTURE, URINE COMPREHENSIVE - BLADDER SCAN AMB NON-IMAGING  2. Rash No rash present on today's exam From description it sounds like a candidal infection of the skin Advised her to use [employer] medical powder and also gave her a prescription for nystatin cream  3. Vaginal atrophy Will start vaginal estrogen cream today Instructed her to apply it Monday, Wednesday and Friday nights Explained that it takes many months of consistent use to make the physiological vaginal changes that are needed Advised the patient that it  is a medicine, not a lotion or barrier cream  4. Incontinence/nocturnal enuresis Patient will continue the Myrbetriq She is encouraged to get back to CPAP use She will be starting with the insulin pump shortly She is also interested in starting the PTNS treatments at this time We will schedule for PTNS   Return for Return for PTNS .  These notes generated with voice recognition software. I apologize for typographical errors.  Zara Council, PA-C  [facility] [address]  [place], [place] [address] [phone]

## 2019-12-29 DIAGNOSIS — B9620 Unspecified Escherichia coli [E. coli] as the cause of diseases classified elsewhere: Secondary | ICD-10-CM | POA: Diagnosis not present

## 2019-12-29 DIAGNOSIS — E1140 Type 2 diabetes mellitus with diabetic neuropathy, unspecified: Secondary | ICD-10-CM | POA: Diagnosis not present

## 2019-12-29 DIAGNOSIS — E1122 Type 2 diabetes mellitus with diabetic chronic kidney disease: Secondary | ICD-10-CM | POA: Diagnosis not present

## 2019-12-29 DIAGNOSIS — N390 Urinary tract infection, site not specified: Secondary | ICD-10-CM | POA: Diagnosis not present

## 2019-12-29 DIAGNOSIS — E039 Hypothyroidism, unspecified: Secondary | ICD-10-CM | POA: Diagnosis not present

## 2019-12-29 DIAGNOSIS — D631 Anemia in chronic kidney disease: Secondary | ICD-10-CM | POA: Diagnosis not present

## 2019-12-29 DIAGNOSIS — I129 Hypertensive chronic kidney disease with stage 1 through stage 4 chronic kidney disease, or unspecified chronic kidney disease: Secondary | ICD-10-CM | POA: Diagnosis not present

## 2019-12-29 DIAGNOSIS — G20 Parkinson's disease: Secondary | ICD-10-CM | POA: Diagnosis not present

## 2019-12-29 DIAGNOSIS — N184 Chronic kidney disease, stage 4 (severe): Secondary | ICD-10-CM | POA: Diagnosis not present

## 2019-12-30 DIAGNOSIS — B9620 Unspecified Escherichia coli [E. coli] as the cause of diseases classified elsewhere: Secondary | ICD-10-CM | POA: Diagnosis not present

## 2019-12-30 DIAGNOSIS — D631 Anemia in chronic kidney disease: Secondary | ICD-10-CM | POA: Diagnosis not present

## 2019-12-30 DIAGNOSIS — E1140 Type 2 diabetes mellitus with diabetic neuropathy, unspecified: Secondary | ICD-10-CM | POA: Diagnosis not present

## 2019-12-30 DIAGNOSIS — E039 Hypothyroidism, unspecified: Secondary | ICD-10-CM | POA: Diagnosis not present

## 2019-12-30 DIAGNOSIS — E1122 Type 2 diabetes mellitus with diabetic chronic kidney disease: Secondary | ICD-10-CM | POA: Diagnosis not present

## 2019-12-30 DIAGNOSIS — G20 Parkinson's disease: Secondary | ICD-10-CM | POA: Diagnosis not present

## 2019-12-30 DIAGNOSIS — I129 Hypertensive chronic kidney disease with stage 1 through stage 4 chronic kidney disease, or unspecified chronic kidney disease: Secondary | ICD-10-CM | POA: Diagnosis not present

## 2019-12-30 DIAGNOSIS — N390 Urinary tract infection, site not specified: Secondary | ICD-10-CM | POA: Diagnosis not present

## 2019-12-30 DIAGNOSIS — N184 Chronic kidney disease, stage 4 (severe): Secondary | ICD-10-CM | POA: Diagnosis not present

## 2019-12-31 DIAGNOSIS — I129 Hypertensive chronic kidney disease with stage 1 through stage 4 chronic kidney disease, or unspecified chronic kidney disease: Secondary | ICD-10-CM | POA: Diagnosis not present

## 2019-12-31 DIAGNOSIS — N390 Urinary tract infection, site not specified: Secondary | ICD-10-CM | POA: Diagnosis not present

## 2019-12-31 DIAGNOSIS — B9620 Unspecified Escherichia coli [E. coli] as the cause of diseases classified elsewhere: Secondary | ICD-10-CM | POA: Diagnosis not present

## 2019-12-31 DIAGNOSIS — G20 Parkinson's disease: Secondary | ICD-10-CM | POA: Diagnosis not present

## 2019-12-31 DIAGNOSIS — E039 Hypothyroidism, unspecified: Secondary | ICD-10-CM | POA: Diagnosis not present

## 2019-12-31 DIAGNOSIS — E1140 Type 2 diabetes mellitus with diabetic neuropathy, unspecified: Secondary | ICD-10-CM | POA: Diagnosis not present

## 2019-12-31 DIAGNOSIS — E1122 Type 2 diabetes mellitus with diabetic chronic kidney disease: Secondary | ICD-10-CM | POA: Diagnosis not present

## 2019-12-31 DIAGNOSIS — N184 Chronic kidney disease, stage 4 (severe): Secondary | ICD-10-CM | POA: Diagnosis not present

## 2019-12-31 DIAGNOSIS — D631 Anemia in chronic kidney disease: Secondary | ICD-10-CM | POA: Diagnosis not present

## 2020-01-02 DIAGNOSIS — E1165 Type 2 diabetes mellitus with hyperglycemia: Secondary | ICD-10-CM | POA: Diagnosis not present

## 2020-01-02 LAB — CULTURE, URINE COMPREHENSIVE

## 2020-01-05 ENCOUNTER — Telehealth: Payer: Self-pay | Admitting: Family Medicine

## 2020-01-05 NOTE — Telephone Encounter (Signed)
LMOM informed patient of negative urine culture.

## 2020-01-05 NOTE — Telephone Encounter (Signed)
-----   Message from Nori Riis, PA-C sent at 01/05/2020  9:33 AM EST ----- Please let Mrs. Crandell know that her urine culture was negative for infection.

## 2020-01-06 ENCOUNTER — Inpatient Hospital Stay: Payer: Medicare HMO

## 2020-01-06 ENCOUNTER — Other Ambulatory Visit: Payer: Self-pay

## 2020-01-06 VITALS — BP 130/71 | HR 88

## 2020-01-06 DIAGNOSIS — E1140 Type 2 diabetes mellitus with diabetic neuropathy, unspecified: Secondary | ICD-10-CM | POA: Diagnosis not present

## 2020-01-06 DIAGNOSIS — B9620 Unspecified Escherichia coli [E. coli] as the cause of diseases classified elsewhere: Secondary | ICD-10-CM | POA: Diagnosis not present

## 2020-01-06 DIAGNOSIS — D631 Anemia in chronic kidney disease: Secondary | ICD-10-CM

## 2020-01-06 DIAGNOSIS — I129 Hypertensive chronic kidney disease with stage 1 through stage 4 chronic kidney disease, or unspecified chronic kidney disease: Secondary | ICD-10-CM | POA: Diagnosis not present

## 2020-01-06 DIAGNOSIS — N184 Chronic kidney disease, stage 4 (severe): Secondary | ICD-10-CM

## 2020-01-06 DIAGNOSIS — N1832 Chronic kidney disease, stage 3b: Secondary | ICD-10-CM | POA: Diagnosis not present

## 2020-01-06 DIAGNOSIS — G20 Parkinson's disease: Secondary | ICD-10-CM | POA: Diagnosis not present

## 2020-01-06 DIAGNOSIS — E1122 Type 2 diabetes mellitus with diabetic chronic kidney disease: Secondary | ICD-10-CM | POA: Diagnosis not present

## 2020-01-06 DIAGNOSIS — N390 Urinary tract infection, site not specified: Secondary | ICD-10-CM | POA: Diagnosis not present

## 2020-01-06 DIAGNOSIS — Z79899 Other long term (current) drug therapy: Secondary | ICD-10-CM | POA: Diagnosis not present

## 2020-01-06 DIAGNOSIS — E039 Hypothyroidism, unspecified: Secondary | ICD-10-CM | POA: Diagnosis not present

## 2020-01-06 LAB — HEMOGLOBIN AND HEMATOCRIT, BLOOD
HCT: 30.7 % — ABNORMAL LOW (ref 36.0–46.0)
Hemoglobin: 9.8 g/dL — ABNORMAL LOW (ref 12.0–15.0)

## 2020-01-06 MED ORDER — EPOETIN ALFA-EPBX 40000 UNIT/ML IJ SOLN: 20000.0000 [IU] | Freq: Once | INTRAMUSCULAR | Status: DC

## 2020-01-06 MED ORDER — EPOETIN ALFA-EPBX 10000 UNIT/ML IJ SOLN
20000.0000 [IU] | Freq: Once | INTRAMUSCULAR | Status: AC
  Administered 2020-01-06: 20000 [IU] via SUBCUTANEOUS
  Filled 2020-01-06: qty 2

## 2020-01-07 DIAGNOSIS — B9620 Unspecified Escherichia coli [E. coli] as the cause of diseases classified elsewhere: Secondary | ICD-10-CM | POA: Diagnosis not present

## 2020-01-07 DIAGNOSIS — N184 Chronic kidney disease, stage 4 (severe): Secondary | ICD-10-CM | POA: Diagnosis not present

## 2020-01-07 DIAGNOSIS — E039 Hypothyroidism, unspecified: Secondary | ICD-10-CM | POA: Diagnosis not present

## 2020-01-07 DIAGNOSIS — E1122 Type 2 diabetes mellitus with diabetic chronic kidney disease: Secondary | ICD-10-CM | POA: Diagnosis not present

## 2020-01-07 DIAGNOSIS — D631 Anemia in chronic kidney disease: Secondary | ICD-10-CM | POA: Diagnosis not present

## 2020-01-07 DIAGNOSIS — E43 Unspecified severe protein-calorie malnutrition: Secondary | ICD-10-CM | POA: Diagnosis not present

## 2020-01-07 DIAGNOSIS — G20 Parkinson's disease: Secondary | ICD-10-CM | POA: Diagnosis not present

## 2020-01-07 DIAGNOSIS — E1140 Type 2 diabetes mellitus with diabetic neuropathy, unspecified: Secondary | ICD-10-CM | POA: Diagnosis not present

## 2020-01-07 DIAGNOSIS — N390 Urinary tract infection, site not specified: Secondary | ICD-10-CM | POA: Diagnosis not present

## 2020-01-07 DIAGNOSIS — I129 Hypertensive chronic kidney disease with stage 1 through stage 4 chronic kidney disease, or unspecified chronic kidney disease: Secondary | ICD-10-CM | POA: Diagnosis not present

## 2020-01-09 ENCOUNTER — Other Ambulatory Visit: Payer: Self-pay

## 2020-01-09 DIAGNOSIS — E1140 Type 2 diabetes mellitus with diabetic neuropathy, unspecified: Secondary | ICD-10-CM | POA: Diagnosis not present

## 2020-01-09 DIAGNOSIS — B9620 Unspecified Escherichia coli [E. coli] as the cause of diseases classified elsewhere: Secondary | ICD-10-CM | POA: Diagnosis not present

## 2020-01-09 DIAGNOSIS — E039 Hypothyroidism, unspecified: Secondary | ICD-10-CM | POA: Diagnosis not present

## 2020-01-09 DIAGNOSIS — N184 Chronic kidney disease, stage 4 (severe): Secondary | ICD-10-CM | POA: Diagnosis not present

## 2020-01-09 DIAGNOSIS — E1122 Type 2 diabetes mellitus with diabetic chronic kidney disease: Secondary | ICD-10-CM | POA: Diagnosis not present

## 2020-01-09 DIAGNOSIS — D631 Anemia in chronic kidney disease: Secondary | ICD-10-CM | POA: Diagnosis not present

## 2020-01-09 DIAGNOSIS — G20 Parkinson's disease: Secondary | ICD-10-CM | POA: Diagnosis not present

## 2020-01-09 DIAGNOSIS — I129 Hypertensive chronic kidney disease with stage 1 through stage 4 chronic kidney disease, or unspecified chronic kidney disease: Secondary | ICD-10-CM | POA: Diagnosis not present

## 2020-01-09 DIAGNOSIS — N390 Urinary tract infection, site not specified: Secondary | ICD-10-CM | POA: Diagnosis not present

## 2020-01-09 DIAGNOSIS — R1310 Dysphagia, unspecified: Secondary | ICD-10-CM

## 2020-01-12 ENCOUNTER — Other Ambulatory Visit: Payer: Self-pay

## 2020-01-12 ENCOUNTER — Encounter: Payer: Self-pay | Admitting: Gastroenterology

## 2020-01-12 DIAGNOSIS — I129 Hypertensive chronic kidney disease with stage 1 through stage 4 chronic kidney disease, or unspecified chronic kidney disease: Secondary | ICD-10-CM | POA: Diagnosis not present

## 2020-01-12 DIAGNOSIS — D631 Anemia in chronic kidney disease: Secondary | ICD-10-CM | POA: Diagnosis not present

## 2020-01-12 DIAGNOSIS — G20 Parkinson's disease: Secondary | ICD-10-CM | POA: Diagnosis not present

## 2020-01-12 DIAGNOSIS — N390 Urinary tract infection, site not specified: Secondary | ICD-10-CM | POA: Diagnosis not present

## 2020-01-12 DIAGNOSIS — N184 Chronic kidney disease, stage 4 (severe): Secondary | ICD-10-CM | POA: Diagnosis not present

## 2020-01-12 DIAGNOSIS — E1140 Type 2 diabetes mellitus with diabetic neuropathy, unspecified: Secondary | ICD-10-CM | POA: Diagnosis not present

## 2020-01-12 DIAGNOSIS — E039 Hypothyroidism, unspecified: Secondary | ICD-10-CM | POA: Diagnosis not present

## 2020-01-12 DIAGNOSIS — E1122 Type 2 diabetes mellitus with diabetic chronic kidney disease: Secondary | ICD-10-CM | POA: Diagnosis not present

## 2020-01-12 DIAGNOSIS — B9620 Unspecified Escherichia coli [E. coli] as the cause of diseases classified elsewhere: Secondary | ICD-10-CM | POA: Diagnosis not present

## 2020-01-13 ENCOUNTER — Inpatient Hospital Stay: Payer: Medicare HMO

## 2020-01-13 ENCOUNTER — Other Ambulatory Visit
Admission: RE | Admit: 2020-01-13 | Discharge: 2020-01-13 | Disposition: A | Discharge status: Home / Self Care | Payer: Medicare HMO | Source: Ambulatory Visit | Attending: Gastroenterology | Admitting: Gastroenterology

## 2020-01-13 ENCOUNTER — Telehealth: Payer: Self-pay

## 2020-01-13 DIAGNOSIS — Z20822 Contact with and (suspected) exposure to covid-19: Secondary | ICD-10-CM | POA: Insufficient documentation

## 2020-01-13 DIAGNOSIS — E1165 Type 2 diabetes mellitus with hyperglycemia: Secondary | ICD-10-CM | POA: Diagnosis not present

## 2020-01-13 DIAGNOSIS — Z01812 Encounter for preprocedural laboratory examination: Secondary | ICD-10-CM | POA: Diagnosis not present

## 2020-01-13 NOTE — Telephone Encounter (Signed)
Left vm informing pt ok'd per Dr. Ouida Sills to stop Plavix 5 days prior. See media for the blood thinner request.

## 2020-01-13 NOTE — Discharge Instructions (Signed)
General Anesthesia, Adult, Care After This sheet gives you information about how to care for yourself after your procedure. Your health care provider may also give you more specific instructions. If you have problems or questions, contact your health care provider. What can I expect after the procedure? After the procedure, the following side effects are common:  Pain or discomfort at the IV site.  Nausea.  Vomiting.  Sore throat.  Trouble concentrating.  Feeling cold or chills.  Weak or tired.  Sleepiness and fatigue.  Soreness and body aches. These side effects can affect parts of the body that were not involved in surgery. Follow these instructions at home:  For at least 24 hours after the procedure:  Have a responsible adult stay with you. It is important to have someone help care for you until you are awake and alert.  Rest as needed.  Do not: ? Participate in activities in which you could fall or become injured. ? Drive. ? Use heavy machinery. ? Drink alcohol. ? Take sleeping pills or medicines that cause drowsiness. ? Make important decisions or sign legal documents. ? Take care of children on your own. Eating and drinking  Follow any instructions from your health care provider about eating or drinking restrictions.  When you feel hungry, start by eating small amounts of foods that are soft and easy to digest (bland), such as toast. Gradually return to your regular diet.  Drink enough fluid to keep your urine pale yellow.  If you vomit, rehydrate by drinking water, juice, or clear broth. General instructions  If you have sleep apnea, surgery and certain medicines can increase your risk for breathing problems. Follow instructions from your health care provider about wearing your sleep device: ? Anytime you are sleeping, including during daytime naps. ? While taking prescription pain medicines, sleeping medicines, or medicines that make you drowsy.  Return to  your normal activities as told by your health care provider. Ask your health care provider what activities are safe for you.  Take over-the-counter and prescription medicines only as told by your health care provider.  If you smoke, do not smoke without supervision.  Keep all follow-up visits as told by your health care provider. This is important. Contact a health care provider if:  You have nausea or vomiting that does not get better with medicine.  You cannot eat or drink without vomiting.  You have pain that does not get better with medicine.  You are unable to pass urine.  You develop a skin rash.  You have a fever.  You have redness around your IV site that gets worse. Get help right away if:  You have difficulty breathing.  You have chest pain.  You have blood in your urine or stool, or you vomit blood. Summary  After the procedure, it is common to have a sore throat or nausea. It is also common to feel tired.  Have a responsible adult stay with you for the first 24 hours after general anesthesia. It is important to have someone help care for you until you are awake and alert.  When you feel hungry, start by eating small amounts of foods that are soft and easy to digest (bland), such as toast. Gradually return to your regular diet.  Drink enough fluid to keep your urine pale yellow.  Return to your normal activities as told by your health care provider. Ask your health care provider what activities are safe for you. This information is not   intended to replace advice given to you by your health care provider. Make sure you discuss any questions you have with your health care provider. Document Revised: 01/26/2017 Document Reviewed: 09/08/2016 Elsevier Patient Education  2020 [employer]  

## 2020-01-14 DIAGNOSIS — G20 Parkinson's disease: Secondary | ICD-10-CM | POA: Diagnosis not present

## 2020-01-14 DIAGNOSIS — D631 Anemia in chronic kidney disease: Secondary | ICD-10-CM | POA: Diagnosis not present

## 2020-01-14 DIAGNOSIS — I129 Hypertensive chronic kidney disease with stage 1 through stage 4 chronic kidney disease, or unspecified chronic kidney disease: Secondary | ICD-10-CM | POA: Diagnosis not present

## 2020-01-14 DIAGNOSIS — E039 Hypothyroidism, unspecified: Secondary | ICD-10-CM | POA: Diagnosis not present

## 2020-01-14 DIAGNOSIS — E1140 Type 2 diabetes mellitus with diabetic neuropathy, unspecified: Secondary | ICD-10-CM | POA: Diagnosis not present

## 2020-01-14 DIAGNOSIS — E1122 Type 2 diabetes mellitus with diabetic chronic kidney disease: Secondary | ICD-10-CM | POA: Diagnosis not present

## 2020-01-14 DIAGNOSIS — B9620 Unspecified Escherichia coli [E. coli] as the cause of diseases classified elsewhere: Secondary | ICD-10-CM | POA: Diagnosis not present

## 2020-01-14 DIAGNOSIS — N390 Urinary tract infection, site not specified: Secondary | ICD-10-CM | POA: Diagnosis not present

## 2020-01-14 DIAGNOSIS — N184 Chronic kidney disease, stage 4 (severe): Secondary | ICD-10-CM | POA: Diagnosis not present

## 2020-01-14 LAB — SARS CORONAVIRUS 2 (TAT 6-24 HRS): SARS Coronavirus 2: NEGATIVE

## 2020-01-15 ENCOUNTER — Encounter
Admission: RE | Disposition: A | Discharge status: Home / Self Care | Payer: Self-pay | Source: Home / Self Care | Attending: Gastroenterology

## 2020-01-15 ENCOUNTER — Ambulatory Visit
Admission: RE | Admit: 2020-01-15 | Discharge: 2020-01-15 | Disposition: A | Discharge status: Home / Self Care | Payer: Medicare HMO | Attending: Gastroenterology | Admitting: Gastroenterology

## 2020-01-15 ENCOUNTER — Other Ambulatory Visit: Payer: Self-pay

## 2020-01-15 ENCOUNTER — Ambulatory Visit: Payer: Medicare HMO | Admitting: Anesthesiology

## 2020-01-15 ENCOUNTER — Encounter: Payer: Self-pay | Admitting: Gastroenterology

## 2020-01-15 DIAGNOSIS — Z7902 Long term (current) use of antithrombotics/antiplatelets: Secondary | ICD-10-CM | POA: Diagnosis not present

## 2020-01-15 DIAGNOSIS — R1310 Dysphagia, unspecified: Secondary | ICD-10-CM | POA: Diagnosis not present

## 2020-01-15 DIAGNOSIS — N184 Chronic kidney disease, stage 4 (severe): Secondary | ICD-10-CM | POA: Diagnosis not present

## 2020-01-15 DIAGNOSIS — Z79899 Other long term (current) drug therapy: Secondary | ICD-10-CM | POA: Insufficient documentation

## 2020-01-15 DIAGNOSIS — K317 Polyp of stomach and duodenum: Secondary | ICD-10-CM | POA: Insufficient documentation

## 2020-01-15 DIAGNOSIS — B9620 Unspecified Escherichia coli [E. coli] as the cause of diseases classified elsewhere: Secondary | ICD-10-CM | POA: Diagnosis not present

## 2020-01-15 DIAGNOSIS — Z7984 Long term (current) use of oral hypoglycemic drugs: Secondary | ICD-10-CM | POA: Insufficient documentation

## 2020-01-15 DIAGNOSIS — E1122 Type 2 diabetes mellitus with diabetic chronic kidney disease: Secondary | ICD-10-CM | POA: Diagnosis not present

## 2020-01-15 DIAGNOSIS — Z882 Allergy status to sulfonamides status: Secondary | ICD-10-CM | POA: Diagnosis not present

## 2020-01-15 DIAGNOSIS — Z794 Long term (current) use of insulin: Secondary | ICD-10-CM | POA: Diagnosis not present

## 2020-01-15 DIAGNOSIS — Z79890 Hormone replacement therapy (postmenopausal): Secondary | ICD-10-CM | POA: Diagnosis not present

## 2020-01-15 DIAGNOSIS — D631 Anemia in chronic kidney disease: Secondary | ICD-10-CM | POA: Diagnosis not present

## 2020-01-15 DIAGNOSIS — G20 Parkinson's disease: Secondary | ICD-10-CM | POA: Diagnosis not present

## 2020-01-15 DIAGNOSIS — I129 Hypertensive chronic kidney disease with stage 1 through stage 4 chronic kidney disease, or unspecified chronic kidney disease: Secondary | ICD-10-CM | POA: Diagnosis not present

## 2020-01-15 DIAGNOSIS — N390 Urinary tract infection, site not specified: Secondary | ICD-10-CM | POA: Diagnosis not present

## 2020-01-15 HISTORY — DX: Chronic kidney disease, stage 3 unspecified: N18.30

## 2020-01-15 HISTORY — PX: ESOPHAGEAL DILATION: SHX303

## 2020-01-15 HISTORY — PX: ESOPHAGOGASTRODUODENOSCOPY (EGD) WITH PROPOFOL: SHX5813

## 2020-01-15 LAB — GLUCOSE, CAPILLARY
Glucose-Capillary: 136 mg/dL — ABNORMAL HIGH (ref 70–99)
Glucose-Capillary: 125 mg/dL — ABNORMAL HIGH (ref 70–99)

## 2020-01-15 SURGERY — ESOPHAGOGASTRODUODENOSCOPY (EGD) WITH PROPOFOL
Anesthesia: General | Site: Throat

## 2020-01-15 MED ORDER — ACETAMINOPHEN 325 MG PO TABS: 325.0000 mg | ORAL_TABLET | Freq: Once | ORAL | Status: DC

## 2020-01-15 MED ORDER — ACETAMINOPHEN 160 MG/5ML PO SOLN: 325.0000 mg | Freq: Once | ORAL | Status: DC

## 2020-01-15 MED ORDER — GLYCOPYRROLATE 0.2 MG/ML IJ SOLN
INTRAMUSCULAR | Status: DC | PRN
  Administered 2020-01-15: .2 mg via INTRAVENOUS

## 2020-01-15 MED ORDER — LACTATED RINGERS IV SOLN: INTRAVENOUS | Status: DC

## 2020-01-15 MED ORDER — LIDOCAINE HCL (CARDIAC) PF 100 MG/5ML IV SOSY
PREFILLED_SYRINGE | INTRAVENOUS | Status: DC | PRN
  Administered 2020-01-15: 50 mg via INTRAVENOUS

## 2020-01-15 MED ORDER — PROPOFOL 10 MG/ML IV BOLUS
INTRAVENOUS | Status: DC | PRN
  Administered 2020-01-15: 20 mg via INTRAVENOUS
  Administered 2020-01-15: 50 mg via INTRAVENOUS

## 2020-01-15 SURGICAL SUPPLY — 10 items
BALLN DILATOR 15-18 8 (BALLOONS) ×4
BALLOON DILATOR 15-18 8 (BALLOONS) ×2 IMPLANT
BLOCK BITE 60FR ADLT L/F GRN (MISCELLANEOUS) ×4 IMPLANT
GOWN CVR UNV OPN BCK APRN NK (MISCELLANEOUS) ×4 IMPLANT
GOWN ISOL THUMB LOOP REG UNIV (MISCELLANEOUS) ×8
KIT PRC NS LF DISP ENDO (KITS) ×2 IMPLANT
KIT PROCEDURE OLYMPUS (KITS) ×4
MANIFOLD NEPTUNE II (INSTRUMENTS) ×4 IMPLANT
SYR INFLATION 60ML (SYRINGE) ×4 IMPLANT
WATER STERILE IRR 250ML POUR (IV SOLUTION) ×4 IMPLANT

## 2020-01-15 NOTE — H&P (Signed)
Lucilla Lame, MD [address] [place], [place] [address] Phone:[phone] Fax : [phone]  Primary Care Physician:  Kirk Ruths, MD Primary Gastroenterologist:  Dr. Allen Norris  Pre-Procedure History & Physical: HPI:  Elizabeth Rivera is a 69 y.o. female is here for an endoscopy.   Past Medical History:  Diagnosis Date  . Anemia   . Anemia of other chronic disease 09/01/2013  . Anxiety   . Arthritis    back s/p fracture x3 approx 1983  . Candidal vaginitis   . CKD (chronic kidney disease), stage III ([place])   . Cognitive impairment   . Diabetes mellitus without complication ([place])   . Diverticulosis of colon (without mention of hemorrhage) 05/20/2009   Internal hemms (Dr. Vira Agar)  . Forearm fracture    2013, left  . GERD (gastroesophageal reflux disease)    reflux HH 09/01/2002//egd reflux Esoph. HH Gastritis nonbleeding erosive gastropathy Duodentitis 08/15/2006  . Hyperglycemia   . Hyperlipidemia   . Hypertension   . Insomnia, unspecified   . Irritable bowel syndrome    history of  . Nervous breakdown 08/2002   [facility]  . Neuropathy   . Other abnormal blood chemistry   . Other chest pain    chest discomfort  . Other esophagitis    erosive esophagitis  . Other specified disorders of adrenal glands 07/2007   adrenal mass via of CT 1.4cm left Adrenal no change(Dr.Cope)   . Other specified gastritis without mention of hemorrhage    erosvie gastritis  . Parkinsons disease, secondary ([place])   . Stroke ([place]) 03/2014  . Stroke ([place]) 03/2015  . Thyroid disease    hypothyroid, goiter  . Wears dentures    partial upper and lower.    Past Surgical History:  Procedure Laterality Date  . ABDOMINAL HYSTERECTOMY  1995   Part Hyst BSO DUB ovarian cyst B9  . BREAST CYST ASPIRATION Bilateral   . BREAST REDUCTION SURGERY  1984  . CHOLECYSTECTOMY  03/2004  . COLONOSCOPY  1. 08/15/06  2. 05/20/09   Int Hemms  2. Divertics Int Hemms (Dr. Vira Agar)  .  COLONOSCOPY WITH PROPOFOL N/A 03/05/2018   Procedure: COLONOSCOPY WITH PROPOFOL;  Surgeon: Lucilla Lame, MD;  Location: [facility] ENDOSCOPY;  Service: Endoscopy;  Laterality: N/A;  . CT ABD W & PELVIS WO CM  6/09   CT Scan abd stable adrenal adenoma 1.4cm left adrenal no change (Dr. Jacqlyn Larsen)  . CYSTOSCOPY  01/08/2008   Former site of inflammation healed (Dr. Jacqlyn Larsen)  . ESOPHAGOGASTRODUODENOSCOPY  1. 09/01/02  2. 08/15/06   1. Reflux, HH  2. Reflux esoph HH gaastritis nonbleeding erosive gastropathy duodenitis  . ESOPHAGOGASTRODUODENOSCOPY (EGD) WITH PROPOFOL N/A 06/01/2016   Procedure: ESOPHAGOGASTRODUODENOSCOPY (EGD) WITH PROPOFOL;  Surgeon: Lucilla Lame, MD;  Location: [facility];  Service: Endoscopy;  Laterality: N/A;  diabetic - insulin and oral meds  . ESOPHAGOGASTRODUODENOSCOPY (EGD) WITH PROPOFOL N/A 08/19/2019   Procedure: ESOPHAGOGASTRODUODENOSCOPY (EGD) WITH PROPOFOL;  Surgeon: Lucilla Lame, MD;  Location: [facility] ENDOSCOPY;  Service: Endoscopy;  Laterality: N/A;  . NSVD x1  1977  . REDUCTION MAMMAPLASTY  1985  . VAGINAL DELIVERY  1977    Prior to Admission medications   Medication Sig Start Date End Date Taking? Authorizing Provider  albuterol (VENTOLIN HFA) 108 (90 Base) MCG/ACT inhaler Inhale 2 puffs into the lungs every 6 (six) hours as needed for wheezing or shortness of breath. 09/08/19  Yes Cammie Sickle, MD  ALPRAZolam Duanne Moron) 0.25 MG  tablet Take 1 tablet (0.25 mg total) by mouth 2 (two) times daily. 03/15/15  Yes Mody, Ulice Bold, MD  amLODipine (NORVASC) 5 MG tablet Take by mouth. 11/26/19 11/25/20 Yes [provider]  atorvastatin (LIPITOR) 40 MG tablet Take 1 tablet (40 mg total) by mouth daily at 6 PM. 04/21/15  Yes Tonia Ghent, MD  bisacodyl (DULCOLAX) 10 MG suppository Place 10 mg rectally as needed for moderate constipation.    Yes [provider]  Blood Glucose Calibration (LIBERTY GLUCOSE CONTROL) Normal LIQD Use 1 each as directed 08/19/19  Yes [provider]  carbidopa-levodopa (SINEMET IR) 25-100 MG tablet Take 1 tablet by mouth 3 (three) times daily.  10/10/19  Yes [provider]  carvedilol (COREG) 12.5 MG tablet carvedilol 12.5 mg tablet 02/27/19  Yes [provider]  cholecalciferol (VITAMIN D) 1000 units tablet Take 1,000 Units by mouth daily.   Yes [provider]  conjugated estrogens (PREMARIN) vaginal cream Apply 0.36m (pea-sized amount)  just inside the vaginal introitus with a finger-tip on  Monday, Wednesday and Friday nights. 12/26/19  Yes McGowan, SLarene BeachA, PA-C  Continuous Blood Gluc Receiver (FREESTYLE LIBRE 2 READER SYSTM) DEVI Use 1 Device as directed 02/12/19  Yes [provider]  Continuous Blood Gluc Sensor (FREESTYLE LIBRE 2 SENSOR SYSTM) MISC Use 1 kit every 14 (fourteen) days for glucose monitoring 02/12/19  Yes [provider]  CRANBERRY PO Take 1 capsule by mouth daily.    Yes [provider]  dronabinol (MARINOL) 5 MG capsule Take 5 mg by mouth 2 (two) times daily before a meal.   Yes [provider]  DROPLET PEN NEEDLES 31G X 5 MM [place] 06/26/19  Yes [provider]  famotidine (PEPCID) 20 MG tablet TAKE 1 TABLET EVERY DAY 11/17/19  Yes WLucilla Lame MD  ferrous sulfate ([employer] IRON) 325 (65 FE) MG tablet Take 1 tablet (325 mg total) by mouth daily with breakfast. 04/08/15  Yes DTonia Ghent MD  gabapentin (NEURONTIN) 100 MG capsule Take 200 mg by mouth 3 (three) times daily.    Yes [provider]  glimepiride (AMARYL) 4 MG tablet glimepiride 4 mg tablet 02/11/19  Yes [provider]  glucose blood (PRECISION QID TEST) test strip 1 each (1 strip total) by XX route 3 (three) times daily Use as instructed. 08/19/19 08/18/20 Yes [provider]  insulin aspart (NOVOLOG) 100 UNIT/ML injection Inject 5 Units into the skin 3 (three) times daily before meals.   Yes [provider]  iron polysaccharides (NIFEREX) 150 MG  capsule Take by mouth.   Yes [provider]  levothyroxine (SYNTHROID) 125 MCG tablet Take 125 mcg by mouth daily before breakfast.   Yes [provider]  Melatonin 5 MG TABS Take 5 mg by mouth at bedtime.   Yes [provider]  metFORMIN (GLUCOPHAGE) 500 MG tablet Take 500 mg by mouth 2 (two) times daily with a meal. Twice a day 06/09/19  Yes [provider]  mirabegron ER (MYRBETRIQ) 25 MG TB24 tablet Take 1 tablet (25 mg total) by mouth daily. 09/02/15  Yes BNickie Retort MD  mirtazapine (REMERON) 7.5 MG tablet Take 7.5 mg by mouth at bedtime.  06/09/19  Yes [provider]  nystatin cream (MYCOSTATIN) Apply 1 application topically 2 (two) times daily. 12/26/19  Yes McGowan, SLarene BeachA, PA-C  omeprazole (PRILOSEC) 40 MG capsule Take by mouth.   Yes [provider]  pantoprazole (PROTONIX) 40 MG  tablet Take 1 tablet (40 mg total) by mouth 2 (two) times daily. 04/25/15  Yes Tonia Ghent, MD  torsemide [facility]) 10 MG tablet  10/31/19  Yes [provider]  venlafaxine XR (EFFEXOR-XR) 150 MG 24 hr capsule Take 1 capsule (150 mg total) by mouth 2 (two) times daily. 01/08/15  Yes Tonia Ghent, MD  clopidogrel (PLAVIX) 75 MG tablet Take 1 tablet (75 mg total) by mouth daily. 04/22/15   Tonia Ghent, MD  insulin glargine (LANTUS) 100 UNIT/ML injection Inject into the skin at bedtime. Per sliding scal Patient not taking: Reported on 01/12/2020    [provider]    Allergies as of 01/09/2020 - Review Complete 12/26/2019  Allergen Reaction Noted  . Sulfa antibiotics Rash and Anaphylaxis 07/08/2012    Family History  Problem Relation Age of Onset  . Hypertension Mother   . Hyperlipidemia Mother   . Stroke Mother        53  . Vision loss Father        vision problems  . Cancer Father        Colon   . Hyperlipidemia Sister   . Hypertension Sister   . Vision loss Sister   . Breast cancer Maternal Grandmother 63   . Breast cancer Cousin        paternal cousin  . Prostate cancer Paternal Grandfather   . Bladder Cancer Neg Hx   . Kidney cancer Neg Hx     Social History   Socioeconomic History  . Marital status: Married    Spouse name: Not on file  . Number of children: Not on file  . Years of education: Not on file  . Highest education level: Not on file  Occupational History  . Not on file  Tobacco Use  . Smoking status: Never Smoker  . Smokeless tobacco: Never Used  Vaping Use  . Vaping Use: Never used  Substance and Sexual Activity  . Alcohol use: No    Alcohol/week: 0.0 standard drinks  . Drug use: No  . Sexual activity: Never  Other Topics Concern  . Not on file  Social History Narrative   No regular exercise.   Worked at [employer] in [place]   Her elderly mother had a CVA and moved in with patient   Social Determinants of Radio broadcast assistant Strain: Not on file  Food Insecurity: Not on file  Transportation Needs: Not on file  Physical Activity: Not on file  Stress: Not on file  Social Connections: Not on file  Intimate Partner Violence: Not on file    Review of Systems: See HPI, otherwise negative ROS  Physical Exam: BP 137/82   Pulse 82   Temp 97.7 F (36.5 C) (Temporal)   Ht 5' 2"  (1.575 m)   Wt 45.4 kg   SpO2 100%   BMI 18.29 kg/m  General:   Alert,  pleasant and cooperative in NAD Head:  Normocephalic and atraumatic. Neck:  Supple; no masses or thyromegaly. Lungs:  Clear throughout to auscultation.    Heart:  Regular rate and rhythm. Abdomen:  Soft, nontender and nondistended. Normal bowel sounds, without guarding, and without rebound.   Neurologic:  Alert and  oriented x4;  grossly normal neurologically.  Impression/Plan: Elizabeth Rivera is here for an endoscopy to be performed for dyspagia  Risks, benefits, limitations, and alternatives regarding  endoscopy have been reviewed with the patient.  Questions have been answered.  All  parties agreeable.   Lucilla Lame, MD  01/15/2020, 8:20 AM

## 2020-01-15 NOTE — Anesthesia Preprocedure Evaluation (Signed)
Anesthesia Evaluation  Patient identified by MRN, date of birth, ID band Patient awake    Reviewed: Allergy & Precautions, H&P , NPO status , Patient's Chart, lab work & pertinent test results  Airway Mallampati: II  TM Distance: >3 FB Neck ROM: full    Dental  (+) Missing   Pulmonary    Pulmonary exam normal breath sounds clear to auscultation       Cardiovascular hypertension, Normal cardiovascular exam Rhythm:regular Rate:Normal     Neuro/Psych PSYCHIATRIC DISORDERS ParkinsonCVA    GI/Hepatic GERD  ,  Endo/Other  diabetes, Insulin DependentHypothyroidism Insulin pump  Renal/GU Renal disease     Musculoskeletal   Abdominal   Peds  Hematology   Anesthesia Other Findings   Reproductive/Obstetrics                             Anesthesia Physical Anesthesia Plan  ASA: III  Anesthesia Plan: General   Post-op Pain Management:    Induction: Intravenous  PONV Risk Score and Plan: 3 and Treatment may vary due to age or medical condition, TIVA and Propofol infusion  Airway Management Planned: Natural Airway  Additional Equipment:   Intra-op Plan:   Post-operative Plan:   Informed Consent: I have reviewed the patients History and Physical, chart, labs and discussed the procedure including the risks, benefits and alternatives for the proposed anesthesia with the patient or authorized representative who has indicated his/her understanding and acceptance.     Dental Advisory Given  Plan Discussed with: CRNA  Anesthesia Plan Comments:         Anesthesia Quick Evaluation

## 2020-01-15 NOTE — Anesthesia Postprocedure Evaluation (Signed)
Anesthesia Post Note  Patient: Elizabeth Rivera  Procedure(s) Performed: ESOPHAGOGASTRODUODENOSCOPY (EGD) WITH PROPOFOL (N/A Throat) ESOPHAGEAL DILATION (Throat)     Patient location during evaluation: PACU Anesthesia Type: General Level of consciousness: awake and alert and oriented Pain management: satisfactory to patient Vital Signs Assessment: post-procedure vital signs reviewed and stable Respiratory status: spontaneous breathing, nonlabored ventilation and respiratory function stable Cardiovascular status: blood pressure returned to baseline and stable Postop Assessment: Adequate PO intake and No signs of nausea or vomiting Anesthetic complications: no   No complications documented.  Raliegh Ip

## 2020-01-15 NOTE — Anesthesia Procedure Notes (Signed)
Procedure Name: MAC Date/Time: 01/15/2020 8:38 AM Performed by: Jeannene Patella, CRNA Pre-anesthesia Checklist: Patient identified, Emergency Drugs available, Suction available, Timeout performed and Patient being monitored Patient Re-evaluated:Patient Re-evaluated prior to induction Oxygen Delivery Method: Nasal cannula Placement Confirmation: positive ETCO2

## 2020-01-15 NOTE — Op Note (Signed)
[facility] Gastroenterology Patient Name: Elizabeth Rivera Procedure Date: 01/15/2020 8:18 AM MRN: [id number] Account #: [id number] Date of Birth: [birth year] Admit Type: Outpatient Age: 69 Room: [facility] OR ROOM 01 Gender: Female Note Status: Finalized Procedure:             Upper GI endoscopy Indications:           Dysphagia Providers:             Lucilla Lame MD, MD Referring MD:          Ocie Cornfield. Ouida Sills MD, MD (Referring MD) Medicines:             Propofol per Anesthesia Complications:         No immediate complications. Procedure:             Pre-Anesthesia Assessment:                        - Prior to the procedure, a History and Physical was                         performed, and patient medications and allergies were                         reviewed. The patient's tolerance of previous                         anesthesia was also reviewed. The risks and benefits                         of the procedure and the sedation options and risks                         were discussed with the patient. All questions were                         answered, and informed consent was obtained. Prior                         Anticoagulants: The patient has taken no previous                         anticoagulant or antiplatelet agents. ASA Grade                         Assessment: II - A patient with mild systemic disease.                         After reviewing the risks and benefits, the patient                         was deemed in satisfactory condition to undergo the                         procedure.                        After obtaining informed consent, the endoscope was  passed under direct vision. Throughout the procedure,                         the patient's blood pressure, pulse, and oxygen                         saturations were monitored continuously. The Endoscope                         was introduced through the mouth, and advanced to  the                         second part of duodenum. The upper GI endoscopy was                         accomplished without difficulty. The patient tolerated                         the procedure well. Findings:      The examined esophagus was normal. A TTS dilator was passed through the       scope. Dilation with a 15-16.5-18 mm balloon dilator was performed to 18       mm. The dilation site was examined following endoscope reinsertion and       showed no change.      A few sessile polyps with no bleeding and no stigmata of recent bleeding       were found in the gastric fundus.      The examined duodenum was normal. Impression:            - Normal esophagus. Dilated.                        - A few gastric polyps.                        - Normal examined duodenum.                        - No specimens collected. Recommendation:        - Discharge patient to home.                        - Resume previous diet.                        - Continue present medications. Procedure Code(s):     --- Professional ---                        [phone], Esophagogastroduodenoscopy, flexible,                         transoral; with transendoscopic balloon dilation of                         esophagus (less than 30 mm diameter) Diagnosis Code(s):     --- Professional ---                        R13.10, Dysphagia, unspecified CPT copyright 2019 American Medical Association. All rights reserved. The codes documented  in this report are preliminary and upon coder review may  be revised to meet current compliance requirements. Lucilla Lame MD, MD 01/15/2020 8:39:29 AM This report has been signed electronically. Number of Addenda: 0 Note Initiated On: 01/15/2020 8:18 AM Estimated Blood Loss:  Estimated blood loss: none.      [facility]

## 2020-01-15 NOTE — Transfer of Care (Signed)
Immediate Anesthesia Transfer of Care Note  Patient: Elizabeth Rivera  Procedure(s) Performed: ESOPHAGOGASTRODUODENOSCOPY (EGD) WITH PROPOFOL (N/A Throat) ESOPHAGEAL DILATION (Throat)  Patient Location: PACU  Anesthesia Type: General  Level of Consciousness: awake, alert  and patient cooperative  Airway and Oxygen Therapy: Patient Spontanous Breathing and Patient connected to supplemental oxygen  Post-op Assessment: Post-op Vital signs reviewed, Patient's Cardiovascular Status Stable, Respiratory Function Stable, Patent Airway and No signs of Nausea or vomiting  Post-op Vital Signs: Reviewed and stable  Complications: No complications documented.

## 2020-01-16 ENCOUNTER — Encounter: Payer: Self-pay | Admitting: Gastroenterology

## 2020-01-19 DIAGNOSIS — G20 Parkinson's disease: Secondary | ICD-10-CM | POA: Diagnosis not present

## 2020-01-21 DIAGNOSIS — G20 Parkinson's disease: Secondary | ICD-10-CM | POA: Diagnosis not present

## 2020-01-21 DIAGNOSIS — N390 Urinary tract infection, site not specified: Secondary | ICD-10-CM | POA: Diagnosis not present

## 2020-01-21 DIAGNOSIS — N184 Chronic kidney disease, stage 4 (severe): Secondary | ICD-10-CM | POA: Diagnosis not present

## 2020-01-21 DIAGNOSIS — E1122 Type 2 diabetes mellitus with diabetic chronic kidney disease: Secondary | ICD-10-CM | POA: Diagnosis not present

## 2020-01-21 DIAGNOSIS — I129 Hypertensive chronic kidney disease with stage 1 through stage 4 chronic kidney disease, or unspecified chronic kidney disease: Secondary | ICD-10-CM | POA: Diagnosis not present

## 2020-01-21 DIAGNOSIS — B9620 Unspecified Escherichia coli [E. coli] as the cause of diseases classified elsewhere: Secondary | ICD-10-CM | POA: Diagnosis not present

## 2020-01-21 DIAGNOSIS — E1140 Type 2 diabetes mellitus with diabetic neuropathy, unspecified: Secondary | ICD-10-CM | POA: Diagnosis not present

## 2020-01-21 DIAGNOSIS — D631 Anemia in chronic kidney disease: Secondary | ICD-10-CM | POA: Diagnosis not present

## 2020-01-21 DIAGNOSIS — E039 Hypothyroidism, unspecified: Secondary | ICD-10-CM | POA: Diagnosis not present

## 2020-01-22 DIAGNOSIS — E441 Mild protein-calorie malnutrition: Secondary | ICD-10-CM | POA: Diagnosis not present

## 2020-01-24 DIAGNOSIS — G4733 Obstructive sleep apnea (adult) (pediatric): Secondary | ICD-10-CM | POA: Diagnosis not present

## 2020-01-26 ENCOUNTER — Other Ambulatory Visit: Payer: Self-pay

## 2020-01-26 ENCOUNTER — Ambulatory Visit (INDEPENDENT_AMBULATORY_CARE_PROVIDER_SITE_OTHER): Payer: Medicare HMO | Admitting: Family Medicine

## 2020-01-26 DIAGNOSIS — N3281 Overactive bladder: Secondary | ICD-10-CM | POA: Diagnosis not present

## 2020-01-26 DIAGNOSIS — E1142 Type 2 diabetes mellitus with diabetic polyneuropathy: Secondary | ICD-10-CM | POA: Diagnosis not present

## 2020-01-26 NOTE — Progress Notes (Signed)
PTNS  Session # 1  Health & Social Factors: no change Caffeine: 1 Alcohol: 0 Daytime voids #per day: 4 Night-time voids #per night: 0 Urgency: mild Incontinence Episodes #per day: Wets bed all night Ankle used: right Treatment Setting: 15 Feeling/ Response: sensory Comments: Patient tolerated well  Performed By: Elberta Leatherwood, CMA  Follow Up: 1 week #2

## 2020-01-27 ENCOUNTER — Inpatient Hospital Stay: Payer: Medicare HMO | Attending: Internal Medicine

## 2020-01-27 DIAGNOSIS — E1169 Type 2 diabetes mellitus with other specified complication: Secondary | ICD-10-CM | POA: Diagnosis not present

## 2020-01-27 DIAGNOSIS — E039 Hypothyroidism, unspecified: Secondary | ICD-10-CM | POA: Diagnosis not present

## 2020-01-27 DIAGNOSIS — Z79899 Other long term (current) drug therapy: Secondary | ICD-10-CM | POA: Insufficient documentation

## 2020-01-27 DIAGNOSIS — I129 Hypertensive chronic kidney disease with stage 1 through stage 4 chronic kidney disease, or unspecified chronic kidney disease: Secondary | ICD-10-CM | POA: Insufficient documentation

## 2020-01-27 DIAGNOSIS — N1832 Chronic kidney disease, stage 3b: Secondary | ICD-10-CM | POA: Diagnosis not present

## 2020-01-27 DIAGNOSIS — E1159 Type 2 diabetes mellitus with other circulatory complications: Secondary | ICD-10-CM | POA: Diagnosis not present

## 2020-01-27 DIAGNOSIS — E785 Hyperlipidemia, unspecified: Secondary | ICD-10-CM | POA: Diagnosis not present

## 2020-01-27 DIAGNOSIS — I152 Hypertension secondary to endocrine disorders: Secondary | ICD-10-CM | POA: Diagnosis not present

## 2020-01-27 DIAGNOSIS — E1142 Type 2 diabetes mellitus with diabetic polyneuropathy: Secondary | ICD-10-CM | POA: Diagnosis not present

## 2020-01-27 DIAGNOSIS — D631 Anemia in chronic kidney disease: Secondary | ICD-10-CM | POA: Insufficient documentation

## 2020-01-27 LAB — HEMOGLOBIN AND HEMATOCRIT, BLOOD
HCT: 34.6 % — ABNORMAL LOW (ref 36.0–46.0)
Hemoglobin: 11.1 g/dL — ABNORMAL LOW (ref 12.0–15.0)

## 2020-02-02 ENCOUNTER — Ambulatory Visit (INDEPENDENT_AMBULATORY_CARE_PROVIDER_SITE_OTHER): Payer: Medicare HMO | Admitting: Physician Assistant

## 2020-02-02 ENCOUNTER — Other Ambulatory Visit: Payer: Self-pay

## 2020-02-02 DIAGNOSIS — N3281 Overactive bladder: Secondary | ICD-10-CM | POA: Diagnosis not present

## 2020-02-02 NOTE — Progress Notes (Signed)
PTNS  Session # 2  Health & Social Factors: No change Caffeine: 0-1 per day avg Alcohol: 0 Daytime voids #per day: 4-5 Night-time voids #per night: 0-1 avg Urgency: Mild Incontinence Episodes #per day: 2 avg Ankle used: Left Treatment Setting: 11 Feeling/ Response: Toe flex and sensory Comments: Patient tolerated well, no complications were noted,  Performed By: Bradly Bienenstock, CMA   Follow Up: RTC in 1 week for session #3.

## 2020-02-03 ENCOUNTER — Ambulatory Visit: Payer: Self-pay | Admitting: Urology

## 2020-02-05 DIAGNOSIS — Z66 Do not resuscitate: Secondary | ICD-10-CM | POA: Diagnosis not present

## 2020-02-05 DIAGNOSIS — R296 Repeated falls: Secondary | ICD-10-CM | POA: Diagnosis not present

## 2020-02-05 DIAGNOSIS — Z20822 Contact with and (suspected) exposure to covid-19: Secondary | ICD-10-CM | POA: Diagnosis not present

## 2020-02-05 DIAGNOSIS — R571 Hypovolemic shock: Secondary | ICD-10-CM | POA: Diagnosis not present

## 2020-02-05 DIAGNOSIS — I120 Hypertensive chronic kidney disease with stage 5 chronic kidney disease or end stage renal disease: Secondary | ICD-10-CM | POA: Diagnosis not present

## 2020-02-05 DIAGNOSIS — M4800 Spinal stenosis, site unspecified: Secondary | ICD-10-CM | POA: Diagnosis not present

## 2020-02-05 DIAGNOSIS — I6782 Cerebral ischemia: Secondary | ICD-10-CM | POA: Diagnosis not present

## 2020-02-05 DIAGNOSIS — R1310 Dysphagia, unspecified: Secondary | ICD-10-CM | POA: Diagnosis not present

## 2020-02-05 DIAGNOSIS — Z515 Encounter for palliative care: Secondary | ICD-10-CM | POA: Diagnosis not present

## 2020-02-05 DIAGNOSIS — Z6821 Body mass index (BMI) 21.0-21.9, adult: Secondary | ICD-10-CM | POA: Diagnosis not present

## 2020-02-05 DIAGNOSIS — M5136 Other intervertebral disc degeneration, lumbar region: Secondary | ICD-10-CM | POA: Diagnosis not present

## 2020-02-05 DIAGNOSIS — I517 Cardiomegaly: Secondary | ICD-10-CM | POA: Diagnosis not present

## 2020-02-05 DIAGNOSIS — G20 Parkinson's disease: Secondary | ICD-10-CM | POA: Diagnosis not present

## 2020-02-05 DIAGNOSIS — G834 Cauda equina syndrome: Secondary | ICD-10-CM | POA: Diagnosis not present

## 2020-02-05 DIAGNOSIS — R569 Unspecified convulsions: Secondary | ICD-10-CM | POA: Diagnosis not present

## 2020-02-05 DIAGNOSIS — I10 Essential (primary) hypertension: Secondary | ICD-10-CM | POA: Diagnosis not present

## 2020-02-05 DIAGNOSIS — M5030 Other cervical disc degeneration, unspecified cervical region: Secondary | ICD-10-CM | POA: Diagnosis not present

## 2020-02-05 DIAGNOSIS — R627 Adult failure to thrive: Secondary | ICD-10-CM | POA: Diagnosis not present

## 2020-02-05 DIAGNOSIS — R634 Abnormal weight loss: Secondary | ICD-10-CM | POA: Diagnosis not present

## 2020-02-05 DIAGNOSIS — I499 Cardiac arrhythmia, unspecified: Secondary | ICD-10-CM | POA: Diagnosis not present

## 2020-02-05 DIAGNOSIS — M5134 Other intervertebral disc degeneration, thoracic region: Secondary | ICD-10-CM | POA: Diagnosis not present

## 2020-02-05 DIAGNOSIS — R29818 Other symptoms and signs involving the nervous system: Secondary | ICD-10-CM | POA: Diagnosis not present

## 2020-02-05 DIAGNOSIS — I161 Hypertensive emergency: Secondary | ICD-10-CM | POA: Diagnosis not present

## 2020-02-05 DIAGNOSIS — E11649 Type 2 diabetes mellitus with hypoglycemia without coma: Secondary | ICD-10-CM | POA: Diagnosis not present

## 2020-02-05 DIAGNOSIS — G40309 Generalized idiopathic epilepsy and epileptic syndromes, not intractable, without status epilepticus: Secondary | ICD-10-CM | POA: Diagnosis not present

## 2020-02-05 DIAGNOSIS — R531 Weakness: Secondary | ICD-10-CM | POA: Diagnosis not present

## 2020-02-05 DIAGNOSIS — R9082 White matter disease, unspecified: Secondary | ICD-10-CM | POA: Diagnosis not present

## 2020-02-05 DIAGNOSIS — R202 Paresthesia of skin: Secondary | ICD-10-CM | POA: Diagnosis not present

## 2020-02-05 DIAGNOSIS — R200 Anesthesia of skin: Secondary | ICD-10-CM | POA: Diagnosis not present

## 2020-02-05 DIAGNOSIS — N390 Urinary tract infection, site not specified: Secondary | ICD-10-CM | POA: Diagnosis not present

## 2020-02-05 DIAGNOSIS — I619 Nontraumatic intracerebral hemorrhage, unspecified: Secondary | ICD-10-CM | POA: Diagnosis not present

## 2020-02-05 DIAGNOSIS — M48061 Spinal stenosis, lumbar region without neurogenic claudication: Secondary | ICD-10-CM | POA: Diagnosis not present

## 2020-02-05 DIAGNOSIS — N3000 Acute cystitis without hematuria: Secondary | ICD-10-CM | POA: Diagnosis not present

## 2020-02-06 DIAGNOSIS — I10 Essential (primary) hypertension: Secondary | ICD-10-CM | POA: Diagnosis not present

## 2020-02-06 DIAGNOSIS — M5134 Other intervertebral disc degeneration, thoracic region: Secondary | ICD-10-CM | POA: Diagnosis not present

## 2020-02-06 DIAGNOSIS — I619 Nontraumatic intracerebral hemorrhage, unspecified: Secondary | ICD-10-CM | POA: Diagnosis not present

## 2020-02-06 DIAGNOSIS — M5030 Other cervical disc degeneration, unspecified cervical region: Secondary | ICD-10-CM | POA: Diagnosis not present

## 2020-02-06 DIAGNOSIS — M5136 Other intervertebral disc degeneration, lumbar region: Secondary | ICD-10-CM | POA: Diagnosis not present

## 2020-02-06 DIAGNOSIS — R569 Unspecified convulsions: Secondary | ICD-10-CM | POA: Diagnosis not present

## 2020-02-06 DIAGNOSIS — M48061 Spinal stenosis, lumbar region without neurogenic claudication: Secondary | ICD-10-CM | POA: Diagnosis not present

## 2020-02-06 DIAGNOSIS — R9082 White matter disease, unspecified: Secondary | ICD-10-CM | POA: Diagnosis not present

## 2020-02-06 DIAGNOSIS — N3000 Acute cystitis without hematuria: Secondary | ICD-10-CM | POA: Diagnosis not present

## 2020-02-09 ENCOUNTER — Ambulatory Visit: Payer: Self-pay

## 2020-02-12 ENCOUNTER — Telehealth: Payer: Self-pay | Admitting: *Deleted

## 2020-02-12 NOTE — Telephone Encounter (Signed)
Elizabeth Rivera - Will husband call back later to reschedule?

## 2020-02-12 NOTE — Telephone Encounter (Signed)
Per scheduling, husband called at 857 am to request patient's apts on 02/16/20 to be changed.  I reviewed chart. Ok to r/s her apts. Patient is currently at [facility] for acute cystitis in patient.    [place], ok to r/s- r/s out approx. 2 weeks. Speak to husband regarding preferred apt times.

## 2020-02-16 ENCOUNTER — Ambulatory Visit: Payer: Self-pay

## 2020-02-17 ENCOUNTER — Ambulatory Visit: Payer: Medicare HMO | Admitting: Internal Medicine

## 2020-02-17 ENCOUNTER — Other Ambulatory Visit: Payer: Medicare HMO

## 2020-02-17 ENCOUNTER — Ambulatory Visit: Payer: Medicare HMO

## 2020-02-23 ENCOUNTER — Ambulatory Visit: Payer: Self-pay

## 2020-03-01 ENCOUNTER — Ambulatory Visit: Payer: Self-pay

## 2020-03-08 ENCOUNTER — Ambulatory Visit: Payer: Self-pay

## 2020-03-09 DEATH — deceased

## 2020-03-15 ENCOUNTER — Ambulatory Visit: Payer: Self-pay

## 2020-03-22 ENCOUNTER — Ambulatory Visit: Payer: Self-pay

## 2020-03-29 ENCOUNTER — Ambulatory Visit: Payer: Self-pay

## 2020-04-05 ENCOUNTER — Ambulatory Visit: Payer: Self-pay

## 2020-04-12 ENCOUNTER — Ambulatory Visit: Payer: Self-pay
